# Patient Record
Sex: Female | Born: 1950 | Race: White | Hispanic: No | Marital: Married | Smoking: Never smoker
Health system: Southern US, Community
[De-identification: ages and names within clinical notes are randomized; demographics above are authoritative.]

## PROBLEM LIST (undated history)

## (undated) DIAGNOSIS — IMO0002 Reserved for concepts with insufficient information to code with codable children: Secondary | ICD-10-CM

## (undated) DIAGNOSIS — E0590 Thyrotoxicosis, unspecified without thyrotoxic crisis or storm: Secondary | ICD-10-CM

## (undated) DIAGNOSIS — I4891 Unspecified atrial fibrillation: Secondary | ICD-10-CM

## (undated) DIAGNOSIS — M545 Low back pain, unspecified: Secondary | ICD-10-CM

## (undated) DIAGNOSIS — R209 Unspecified disturbances of skin sensation: Secondary | ICD-10-CM

## (undated) DIAGNOSIS — Z973 Presence of spectacles and contact lenses: Secondary | ICD-10-CM

## (undated) DIAGNOSIS — M810 Age-related osteoporosis without current pathological fracture: Secondary | ICD-10-CM

## (undated) DIAGNOSIS — C50912 Malignant neoplasm of unspecified site of left female breast: Secondary | ICD-10-CM

## (undated) DIAGNOSIS — G709 Myoneural disorder, unspecified: Secondary | ICD-10-CM

## (undated) DIAGNOSIS — C50919 Malignant neoplasm of unspecified site of unspecified female breast: Secondary | ICD-10-CM

## (undated) DIAGNOSIS — B977 Papillomavirus as the cause of diseases classified elsewhere: Secondary | ICD-10-CM

## (undated) DIAGNOSIS — Z9889 Other specified postprocedural states: Secondary | ICD-10-CM

## (undated) DIAGNOSIS — D249 Benign neoplasm of unspecified breast: Secondary | ICD-10-CM

## (undated) DIAGNOSIS — IMO0001 Reserved for inherently not codable concepts without codable children: Secondary | ICD-10-CM

## (undated) DIAGNOSIS — R112 Nausea with vomiting, unspecified: Secondary | ICD-10-CM

## (undated) HISTORY — DX: Myoneural disorder, unspecified: G70.9

## (undated) HISTORY — PX: BREAST SURGERY: SHX581

## (undated) HISTORY — DX: Reserved for inherently not codable concepts without codable children: IMO0001

## (undated) HISTORY — DX: Low back pain, unspecified: M54.50

## (undated) HISTORY — DX: Reserved for concepts with insufficient information to code with codable children: IMO0002

## (undated) HISTORY — PX: OTHER SURGICAL HISTORY: SHX169

## (undated) HISTORY — DX: Benign neoplasm of unspecified breast: D24.9

## (undated) HISTORY — DX: Age-related osteoporosis without current pathological fracture: M81.0

## (undated) HISTORY — DX: Papillomavirus as the cause of diseases classified elsewhere: B97.7

## (undated) HISTORY — DX: Unspecified disturbances of skin sensation: R20.9

## (undated) HISTORY — DX: Low back pain: M54.5

## (undated) HISTORY — PX: DILATION AND CURETTAGE OF UTERUS: SHX78

## (undated) HISTORY — DX: Thyrotoxicosis, unspecified without thyrotoxic crisis or storm: E05.90

## (undated) HISTORY — DX: Unspecified atrial fibrillation: I48.91

## (undated) HISTORY — DX: Malignant neoplasm of unspecified site of unspecified female breast: C50.919

---

## 1998-06-30 HISTORY — PX: OTHER SURGICAL HISTORY: SHX169

## 1998-09-26 ENCOUNTER — Other Ambulatory Visit: Admission: RE | Admit: 1998-09-26 | Discharge: 1998-09-26 | Payer: Self-pay | Admitting: Radiology

## 1998-10-17 ENCOUNTER — Ambulatory Visit (HOSPITAL_BASED_OUTPATIENT_CLINIC_OR_DEPARTMENT_OTHER): Admission: RE | Admit: 1998-10-17 | Discharge: 1998-10-17 | Payer: Self-pay | Admitting: General Surgery

## 1998-11-29 HISTORY — PX: OTHER SURGICAL HISTORY: SHX169

## 1998-12-18 ENCOUNTER — Encounter (INDEPENDENT_AMBULATORY_CARE_PROVIDER_SITE_OTHER): Payer: Self-pay | Admitting: Specialist

## 1998-12-18 ENCOUNTER — Ambulatory Visit (HOSPITAL_COMMUNITY): Admission: RE | Admit: 1998-12-18 | Discharge: 1998-12-18 | Payer: Self-pay | Admitting: *Deleted

## 1999-04-15 ENCOUNTER — Encounter (INDEPENDENT_AMBULATORY_CARE_PROVIDER_SITE_OTHER): Payer: Self-pay

## 1999-04-15 ENCOUNTER — Other Ambulatory Visit: Admission: RE | Admit: 1999-04-15 | Discharge: 1999-04-15 | Payer: Self-pay | Admitting: *Deleted

## 2001-10-20 ENCOUNTER — Encounter: Payer: Self-pay | Admitting: Internal Medicine

## 2001-10-20 ENCOUNTER — Encounter: Admission: RE | Admit: 2001-10-20 | Discharge: 2001-10-20 | Payer: Self-pay | Admitting: Internal Medicine

## 2002-01-03 ENCOUNTER — Ambulatory Visit (HOSPITAL_COMMUNITY): Admission: RE | Admit: 2002-01-03 | Discharge: 2002-01-03 | Payer: Self-pay | Admitting: *Deleted

## 2002-03-01 ENCOUNTER — Other Ambulatory Visit: Admission: RE | Admit: 2002-03-01 | Discharge: 2002-03-01 | Payer: Self-pay | Admitting: Obstetrics & Gynecology

## 2003-03-20 ENCOUNTER — Other Ambulatory Visit: Admission: RE | Admit: 2003-03-20 | Discharge: 2003-03-20 | Payer: Self-pay | Admitting: Obstetrics & Gynecology

## 2004-06-30 DIAGNOSIS — D249 Benign neoplasm of unspecified breast: Secondary | ICD-10-CM

## 2004-06-30 HISTORY — DX: Benign neoplasm of unspecified breast: D24.9

## 2005-07-04 ENCOUNTER — Encounter: Admission: RE | Admit: 2005-07-04 | Discharge: 2005-07-04 | Payer: Self-pay | Admitting: Internal Medicine

## 2006-06-12 ENCOUNTER — Ambulatory Visit: Payer: Self-pay | Admitting: Internal Medicine

## 2007-11-10 ENCOUNTER — Encounter: Payer: Self-pay | Admitting: Internal Medicine

## 2007-11-10 DIAGNOSIS — D249 Benign neoplasm of unspecified breast: Secondary | ICD-10-CM | POA: Insufficient documentation

## 2009-04-24 ENCOUNTER — Ambulatory Visit: Payer: Self-pay | Admitting: Endocrinology

## 2009-07-11 LAB — CONVERTED CEMR LAB: Pap Smear: NORMAL

## 2009-07-17 ENCOUNTER — Encounter: Payer: Self-pay | Admitting: Endocrinology

## 2010-06-17 LAB — CONVERTED CEMR LAB
ALT: 28 units/L (ref 0–35)
AST: 35 units/L (ref 0–37)
Alkaline Phosphatase: 53 units/L (ref 39–117)
Basophils Relative: 0.6 % (ref 0.0–3.0)
Bilirubin, Direct: 0.1 mg/dL (ref 0.0–0.3)
Chloride: 103 meq/L (ref 96–112)
Cholesterol: 196 mg/dL (ref 0–200)
Creatinine, Ser: 0.8 mg/dL (ref 0.4–1.2)
Eosinophils Relative: 2.4 % (ref 0.0–5.0)
LDL Cholesterol: 106 mg/dL — ABNORMAL HIGH (ref 0–99)
Leukocytes, UA: NEGATIVE
MCV: 93.1 fL (ref 78.0–100.0)
Monocytes Absolute: 0.3 10*3/uL (ref 0.1–1.0)
Monocytes Relative: 6.4 % (ref 3.0–12.0)
Neutrophils Relative %: 67.3 % (ref 43.0–77.0)
Potassium: 5.1 meq/L (ref 3.5–5.1)
RBC: 3.87 M/uL (ref 3.87–5.11)
Specific Gravity, Urine: 1.01 (ref 1.000–1.030)
Total CHOL/HDL Ratio: 2
Total Protein: 6.7 g/dL (ref 6.0–8.3)
Urobilinogen, UA: 0.2 (ref 0.0–1.0)
WBC: 5 10*3/uL (ref 4.5–10.5)

## 2010-06-18 ENCOUNTER — Ambulatory Visit: Payer: Self-pay | Admitting: Internal Medicine

## 2010-06-25 ENCOUNTER — Ambulatory Visit
Admission: RE | Admit: 2010-06-25 | Discharge: 2010-06-25 | Payer: Self-pay | Source: Home / Self Care | Attending: Internal Medicine | Admitting: Internal Medicine

## 2010-06-25 ENCOUNTER — Encounter: Payer: Self-pay | Admitting: Internal Medicine

## 2010-06-25 ENCOUNTER — Ambulatory Visit: Payer: Self-pay | Admitting: Internal Medicine

## 2010-06-25 DIAGNOSIS — R209 Unspecified disturbances of skin sensation: Secondary | ICD-10-CM | POA: Insufficient documentation

## 2010-06-25 DIAGNOSIS — M545 Low back pain: Secondary | ICD-10-CM

## 2010-06-25 LAB — CONVERTED CEMR LAB
Folate: >20 ng/mL
Vitamin B-12: 510 pg/mL (ref 211–911)

## 2010-08-01 NOTE — Assessment & Plan Note (Signed)
Summary: cpx-lb   Vital Signs:  Patient profile:   60 year old female Height:      70 inches Weight:      154 pounds BMI:     22.18 O2 Sat:      98 % on Room air Temp:     97.9 degrees F oral Pulse rate:   74 / minute BP sitting:   128 / 80  (left arm) Cuff size:   regular  Vitals Entered By: Bill Salinas CMA (June 25, 2010 1:35 PM)  O2 Flow:  Room air CC: cpx/ ab  Vision Screening:      Vision Comments: Normal eye exam Aug 2011   Primary Care Provider:  Jacques Navy MD  CC:  cpx/ ab.  History of Present Illness: Patient presents for a routine medical visit. [facility]   she is c/o paresthesia at the plantar aspect of toes 3-5 left foot. Present for several years without progression. No history of injury.  She does have varicose veins  Small blue lesion subdermal on the 3rd digit left. Non tender, mobile. No signs of inflammation.  Lower back pain that is triggered with any jolt. Not painful at other times. No limitation in activities. Not positional. No radiation. May benefit from L-S spine x-rays.  Has drainage after eating in the back of the throat followed by cough. Always associated with eating. Concerned for allergy issues vs GERD.   Preventive Screening-Counseling & Management  Alcohol-Tobacco     Alcohol drinks/day: <1     Alcohol type: beer     Alcohol Counseling: not indicated; use of alcohol is not excessive or problematic     Smoking Status: never  Caffeine-Diet-Exercise     Caffeine use/day: 4 cups in AM, 1 soft drink     Diet Comments: healthy diet     Diet Counseling: not indicated; diet is assessed to be healthy     Does Patient Exercise: no     Exercise Counseling: to improve exercise regimen  Hep-HIV-STD-Contraception     Hepatitis Risk: no risk noted     HIV Risk: no risk noted     STD Risk: no risk noted     Dental Visit-last 6 months yes     SBE monthly: no     SBE  Education/Counseling: not indicated; SBE done regularly  Safety-Violence-Falls     Seat Belt Use: yes     Helmet Use: n/a     Firearms in the Home: no firearms in the home     Smoke Detectors: yes     Violence in the Home: no risk noted     Sexual Abuse: no     Fall Risk: none      Sexual History:  currently monogamous.        Drug Use:  never.        Blood Transfusions:  no.    Current Medications (verified): 1)  Multivitamins   Tabs (Multiple Vitamin) .... Take 1 By Mouth Qd 2)  Calcium 600/vitamin D 600-400 Mg-Unit  Tabs (Calcium Carbonate-Vitamin D) .... Take 2 By Mouth Qd 3)  Vagifem .... Twice A Week 4)  Vitamin D3 1000 Unit Tabs (Cholecalciferol) .Marland Kitchen.. 1 Tablet Once A Day 5)  Actonel 35 Mg Tabs (Risedronate Sodium) .Marland Kitchen.. 1 Tablet Once A Week  Allergies (verified): No Known Drug Allergies  Past History:  Past Medical History: Sterotactix needle biopsy, breast calcifaction in 2000  D & C and hysteroscopy for uterine poylp in June 2000 atrophic vaginitis   Physician roster          gyn - Dr. Dortha Schwalbe          opthal - Dr. Emily Filbert  Past Surgical History: breast biopsy 2000 Hysteroscopy for uterine polyp  G2P2 - NSVD  Family History: Father - deceased @81 : SDH while on coumadin, had hyperlipidemia, had cornary artery disease, HTN, DM  Mother - 8: hypothyroid, incontinence, GERDm CHF, A. Fib, osteoporosis, corneal transplants, HTN grandfather CAD.  Neg - breast or colon cancer.  Social History[place] Married - '78 1 son - '81; dtr - '84; 1 gandchild on the way. work: Editor, commissioning and development Marriage in good healthCaffeine use/day:  4 cups in AM, 1 soft drink Does Patient Exercise:  no Hepatitis Risk:  no risk noted HIV Risk:  no risk noted STD Risk:  no risk noted Dental Care w/in 6 mos.:  yes Seat Belt Use:  yes Fall Risk:  none Sexual History:  currently monogamous Drug Use:  never Blood Transfusions:  no  Review of  Systems  The patient denies anorexia, fever, weight loss, weight gain, decreased hearing, hoarseness, chest pain, syncope, dyspnea on exertion, prolonged cough, headaches, hemoptysis, abdominal pain, hematochezia, severe indigestion/heartburn, incontinence, suspicious skin lesions, transient blindness, difficulty walking, unusual weight change, abnormal bleeding, angioedema, and breast masses.    Physical Exam  General:  Well-developed,well-nourished,in no acute distress; alert,appropriate and cooperative throughout examination Head:  Normocephalic and atraumatic without obvious abnormalities. No apparent alopecia or balding. Eyes:  vision grossly intact, pupils equal, pupils round, and corneas and lenses clear.  Further exam deferred to Dr. Dione Booze Ears:  External ear exam shows no significant lesions or deformities.  Otoscopic examination reveals clear canals, tympanic membranes are intact bilaterally without bulging, retraction, inflammation or discharge. Hearing is grossly normal bilaterally. Nose:  no external deformity, no external erythema, and no nasal discharge.   Mouth:  Oral mucosa and oropharynx without lesions or exudates.  Teeth in good repair. Neck:  supple, full ROM, no masses, no thyromegaly, and no carotid bruits.   Chest Wall:  No deformities, masses, or tenderness noted. Breasts:  deferred to gyn Lungs:  Normal respiratory effort, chest expands symmetrically. Lungs are clear to auscultation, no crackles or wheezes. Heart:  Normal rate and regular rhythm. S1 and S2 normal without gallop, murmur, click, rub or other extra sounds. Abdomen:  soft, non-tender, normal bowel sounds, no masses, no guarding, and no hepatomegaly.   Genitalia:  deferred to gyn Msk:  no joint tenderness, no joint swelling, no joint warmth, no redness over joints, and no joint deformities.    Back: nl stand, able to get up to exam without assit, negative SLR sitting, nl DTRs patellar tendon Pulses:  2+  radial and DP pulses; distal foot cooler, slow capillary refill. Extremities:  No clubbing, cyanosis, edema, or deformity noted with normal full range of motion of all joints.   Neurologic:  Decreased deep vibratory sensation both feet starting at the malleolous.alert & oriented X3, cranial nerves II-XII intact, strength normal in all extremities, sensation intact to light touch, sensation intact to pinprick, gait normal, and DTRs symmetrical and normal.   Skin:  turgor normal, color normal, no suspicious lesions, no ulcerations, and no edema.   Cervical Nodes:  no anterior cervical adenopathy.   Psych:  Oriented X3, memory intact for recent and remote, normally interactive, good eye contact, and not  anxious appearing.     Impression & Recommendations:  Problem # 1:  BACK PAIN, LUMBAR (ICD-724.2) Normal exam and limited symptoms.  Plan - L-S spine films for a baseline           flex-stretch exercise will help preserve function.  AddendumRomona Curls SPINE COMPLETE W/BEND - [id number]   Clinical Data: Chronic low back pain.  Paresthesia   LUMBAR SPINE - COMPLETE WITH BENDING VIEWS   Comparison: None   Findings: Five lumbar vertebra are present.  Negative for fracture. Normal alignment.  Moderate to advanced disc degeneration at L5-S1 with  disc space narrowing and minimal spurring.  The remaining disc spaces are normal.  Negative for pars defect.  No mass lesion is present.   Flexion and extension views reveal normal alignment.   IMPRESSION: Moderate to advanced disc degeneration L5-S1 without instability.   Read By:  Camelia Phenes,  M.D  Problem # 2:  PARESTHESIA (ICD-782.0) Normal exam except for small vessel insufficiency distal foot and decreased deep vibratory sensation. This may be an idiopathic neuropathy vs L5-S1 radicular finding vs B12 deficiency  Plan - L-S spine films           B12 level  Orders: TLB-B12 + Folate Pnl ([id number]-B12/FOL) T-Lumbar Spine Comp  w/Bend View [phone])  Addendum: B12 level is normal                    L-S spine with moderate L5-S1 disk disease which may be the cause of her paresthesia  No treatment or futher diagnostic testing indicated at this time.  Problem # 3:  Preventive Health Care (ICD-V70.0) Patient has had no significant illness or medical problems. Her exam is generally normal except for decreased sensation feet. She is current with gynecology and breast health exams. Her labs are normal with an excellent lipid profile: high HDL and LDL of 106 which below goal of 130 or less. She is current with colorectal cancer screening with last study Jan '03, due for follow-up study 2013. Immunizations: Tetnus Jan '04. She is current with bone health screening having had a DXA with findings of osteopenia/osteoporosis and in on actonel for increasing bone density. Recommend calcium 1200mg  daily (diet + supplement) and Vit D 800-1000mg  daily. 12 Lead EKG is normal with no signs of ischemia or injury.  She is counseled to develop a regular exercise program with 3 sessions a week of aerobic exercise - sustained target heart rate 180% of resting pulse and 2-3 sesssions a week of flex-stretch. These can be combined, i.e. pilates, yoga, water exercise.  In summary - a very nice woman who is generally in good health. She is advised to return for routine exam and lab in 2-3 years, sooner on an as needed basis.   Complete Medication List: 1)  Multivitamins Tabs (Multiple vitamin) .... Take 1 by mouth qd 2)  Calcium 600/vitamin D 600-400 Mg-unit Tabs (Calcium carbonate-vitamin d) .... Take 2 by mouth qd 3)  Vagifem  .... Twice a week 4)  Vitamin D3 1000 Unit Tabs (Cholecalciferol) .Marland Kitchen.. 1 tablet once a day 5)  Actonel 35 Mg Tabs (Risedronate sodium) .Marland Kitchen.. 1 tablet once a week  Patient: Crystal [name] Note: All result statuses are Final unless otherwise noted.  Tests: (1) BMP (METABOL)   Sodium                    140 mEq/L  135-145   Potassium                 5.1 mEq/L                   3.5-5.1   Chloride                  103 mEq/L                   96-112   Carbon Dioxide            31 mEq/L                    19-32   Glucose                   90 mg/dL                    04-54   BUN                       15 mg/dL                    0-98   Creatinine                0.8 mg/dL                   1.1-9.1   Calcium                   9.6 mg/dL                   4.7-82.9   GFR                       79.25 mL/min                >60.00  Tests: (2) CBC Platelet w/Diff (CBCD)   White Cell Count          5.0 K/uL                    4.5-10.5   Red Cell Count            3.87 Mil/uL                 3.87-5.11   Hemoglobin                12.4 g/dL                   56.2-13.0   Hematocrit                36.0 %                      36.0-46.0   MCV                       93.1 fl                     78.0-100.0   MCHC                      34.5 g/dL                   86.5-78.4   RDW  12.5 %                      11.5-14.6   Platelet Count            223.0 K/uL                  150.0-400.0   Neutrophil %              67.3 %                      43.0-77.0   Lymphocyte %              23.3 %                      12.0-46.0   Monocyte %                6.4 %                       3.0-12.0   Eosinophils%              2.4 %                       0.0-5.0   Basophils %               0.6 %                       0.0-3.0   Neutrophill Absolute      3.4 K/uL                    1.4-7.7   Lymphocyte Absolute       1.2 K/uL                    0.7-4.0   Monocyte Absolute         0.3 K/uL                    0.1-1.0  Eosinophils, Absolute                             0.1 K/uL                    0.0-0.7   Basophils Absolute        0.0 K/uL                    0.0-0.1  Tests: (3) Hepatic/Liver Function Panel (HEPATIC)   Total Bilirubin           0.7 mg/dL                   1.6-1.0   Direct Bilirubin          0.1 mg/dL                    9.6-0.4   Alkaline Phosphatase      53 U/L                      39-117   AST                       35 U/L  0-37   ALT                       28 U/L                      0-35   Total Protein             6.7 g/dL                    1.6-1.0   Albumin                   3.9 g/dL                    9.6-0.4  Tests: (4) TSH (TSH)   FastTSH                   1.82 uIU/mL                 0.35-5.50  Tests: (5) Lipid Panel (LIPID)   Cholesterol               196 mg/dL                   5-409     ATP III Classification            Desirable:  < 200 mg/dL                    Borderline High:  200 - 239 mg/dL               High:  > = 240 mg/dL   Triglycerides             53.0 mg/dL                  8.1-191.4     Normal:  <150 mg/dL     Borderline High:  782 - 199 mg/dL   HDL                       95.62 mg/dL                 >13.08   VLDL Cholesterol          10.6 mg/dL                  6.5-78.4   LDL Cholesterol      [H]  696 mg/dL                   2-95  CHO/HDL Ratio:  CHD Risk                             2                    Men          Women     1/2 Average Risk     3.4          3.3     Average Risk          5.0          4.4     2X Average Risk          9.6          7.1     3X Average Risk  15.0          11.0                           Tests: (6) UDip w/Micro (URINE)   Color                     LT. YELLOW       RANGE:  Yellow;Lt. Yellow   Clarity                   CLEAR                       Clear   Specific Gravity          1.010                       1.000 - 1.030   Urine Ph                  5.5                         5.0-8.0   Protein                   NEGATIVE                    Negative   Urine Glucose             NEGATIVE                    Negative   Ketones                   NEGATIVE                    Negative   Urine Bilirubin           NEGATIVE                    Negative   Blood                     MODERATE                    Negative    Urobilinogen              0.2                         0.0 - 1.0   Leukocyte Esterace        NEGATIVE                    Negative   Nitrite                   NEGATIVE                    Negative   Urine WBC                 0-2/hpf                     0-2/hpf   Urine RBC                 3-6/hpf  0-2/hpf   Urine Epith               Rare(0-4/hpf)               Rare(0-4/hpf)   Urine Bacteria            Rare(<10/hpf)               None  Tests: (1) B12 + Folate Panel (B12/FOL)   Vitamin B12               510 pg/mL                   211-911   Folate                    >20.0 ng/mL  Orders Added: 1)  Est. Patient 40-64 years [99396] 2)  Est. Patient Level II [16109] 3)  TLB-B12 + Folate Pnl [[id number]-B12/FOL] 4)  T-Lumbar Spine Comp w/Bend View [72114TC]      Preventive Care Screening  Bone Density:    Date:  07/18/2009    Results:  abnormal std dev  Mammogram:    Date:  07/18/2009    Results:  normal   Pap Smear:    Date:  07/11/2009    Results:  normal   Last Tetanus Booster:    Date:  07/13/2002    Results:  Td   Colonoscopy:    Date:  07/14/2001    Results:  normal

## 2010-11-15 NOTE — Procedures (Signed)
[facility]  Patient:    Crystal [name], Crystal [name] Visit Number: [id number] MRN: [id number]          Service Type: END Location: ENDO Attending Physician:  Sabino Gasser Dictated by:   Sabino Gasser, M.D. Proc. Date: 01/03/02 Admit Date:  01/03/2002                             Procedure Report  PROCEDURE:  Colonoscopy.  INDICATION FOR PROCEDURE:  Colon cancer screening.  ANESTHESIA:  Demerol 60, Versed 6 mg.  DESCRIPTION OF PROCEDURE:  With the patient mildly sedated in the left lateral decubitus position, the Olympus videoscopic pediatric colonoscope was inserted in the rectum and passed under direct vision to the cecum identified by the ileocecal valve and appendiceal orifice both of which were photographed. From this point, the colonoscope was slowly withdrawn taking circumferential views of the entire colonic mucosa, stopping only in the rectum which appeared normal in direct and retroflexed view. The endoscope was straightened and withdrawn. The patients vital signs and pulse oximeter remained stable. The patient tolerated the procedure well without apparent complications.  FINDINGS:  Unremarkable examination.  PLAN:  Repeat examination in five years. Dictated by:   Sabino Gasser, M.D. Attending Physician:  Sabino Gasser DD:  01/03/02 TD:  01/04/02 Job: [id number]

## 2010-11-15 NOTE — Assessment & Plan Note (Signed)
[facility]                           PRIMARY CARE OFFICE NOTE   Crystal, [name]                   MRN:          [id number]  DATE:06/12/06                              DOB:          1951/02/22    Crystal [name] presents to establish for ongoing care as a new patient.  I have met her previously when she presented with her mother and father,  Crystal [name] .  She has no active complaints or problems at today's visit,  but presents to establish for ongoing continuity care.   PAST MEDICAL HISTORY:  SURGICAL:  1. Fibroadenoma excised, left breast 1996.  2. Stereotactic needle biopsy, breast calcifications in 2000.  3. Fibroadenoma excised from Crystal right breast in 2000.  4. D&C and hysteroscopy for uterine polyp in June of 2000.  MEDICAL HISTORY:  Usual childhood diseases, and patient is fully  immunized.  Otherwise, her history is negative.   PHYSICIAN ROSTER:  1. Dr. Francina Ames, General Surgery.  2. Dr. Seymour Bars for Gynecology.   MEDICATIONS:  Patient takes multivitamins and calcium with D, no  prescription medications.   HABITS:  TOBACCO:  None.  ALCOHOL:  Averaging 3 ounces per week.   FAMILY HISTORY:  Positive for uterine cancer in her mother.  Father had  hyperlipidemia, had coronary artery disease as did a grandfather.  History of hypertension in both parents and grandparents.  History of  type 2 diabetes in her father.  Father also had cardiac arrhythmia and  was on full anticoagulation.  He unfortunately fell and succumbed to  massive subdural hematoma.   GYN HISTORY:  Significant for being G2, P2, with successful vaginal  deliveries.   SOCIAL HISTORY:  Patient is a Buyer, retail of [employer]  She  has been married for 29 years.  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  She has 2 children,  daughter age 15, a son age 87, and they are both out of Crystal home but  still on Crystal  payroll.  She reports her marriage is in good health.  She  is doing well with grief associated with Crystal unexpected loss of her  father.   REVIEW OF SYSTEMS:  Patient has had no fever, sweats, chills, or weight  changes.  She does have mild sleep latency insomnia with poor sleep  hygiene which we discussed.  She has had an eye exam in Crystal last 24  months.  No ENT, cardiovascular, respiratory, GI complaints.  Crystal  patient has occasional stress incontinence.  No musculoskeletal or  dermatologic problems.   EXAMINATION:  Temperature was 98.7, blood pressure 115/80, pulse 68,  weight 152, height is 5 foot 11 inches.  GENERAL APPEARANCE:  This is a well-nourished, well-developed woman  looking younger than her chronologic age in no acute distress.  HEENT:  Normocephalic/atraumatic, EACs and TMs were unremarkable.  Oropharynx with native dentition in good repair, no buccal or palatal  lesions were noted.  Posterior pharynx was clear.  Conjunctivae and  sclerae were clear.  PERRLA/EOMI.  Funduscopic exam was unremarkable.  NECK:  Supple without thyromegaly.  NODES:  No adenopathy was noted in Crystal cervical, supraclavicular  regions.  CHEST:  No costovertebral angle tenderness.  LUNGS:  Clear to auscultation and percussion.  CARDIOVASCULAR:  2+ radial pulse, no JVD or carotid bruits.  She had a  quiet precordium with regular rate and rhythm, without murmurs, rubs, or  gallops.  BREAST:  Deferred to Gynecology.  ABDOMEN:  Soft, no guarding or rebound.  No organosplenomegaly was  noted.  PELVIC AND RECTAL:  Referred to Gynecology.  EXTREMITIES:  Without clubbing, cyanosis, edema, or deformity.  NEUROLOGIC:  Nonfocal.  SKIN:  Clear.   DATABASE:  CBC revealed a hemoglobin of 12.8 grams, white count was  5,500 with normal differential, platelet count 250,000.  Chemistries  were normal with a blood sugar of 96, BUN of 13, creatinine of 0.9,  liver functions were normal.  Renal function was a  GFR of 84 ml per  minute.  Lipid panel with a cholesterol of 194, triglycerides 46, HDL  was extremely good at 95.6, LDL was 89.   A 12 lead echocardiogram revealed a normal sinus rhythm and a normal  EKG.  Chest x-ray was performed and it was normal, final report from  radiology pending.   ASSESSMENT/PLAN:  This is a delightful woman who seems to be very  healthy and medically stable at this time with no active medical  problems.  Her examination and laboratory are unrevealing.  She is  current and up to date with her gynecologist for GYN screening.  She is  current and up to date with colorectal cancer screening, with Crystal last  colonoscopy by her report in 2003, which was normal making her a  candidate for followup study in 2013.  She had a tetanus shot in 2006.  Last Pap smear was January 2007. 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   Patient is asked to return to see me on a p.r.n. basis and for routine  follow evaluation, examination, and general laboratory in 3 years.     Crystal Gess Norins, MD  Electronically Signed    MEN/MedQ  DD: 06/12/2006  DT: 06/13/2006  Job #: [id number]   cc:   Eudelia Bunch, M.D.

## 2011-05-06 ENCOUNTER — Encounter: Payer: Self-pay | Admitting: Internal Medicine

## 2011-05-07 ENCOUNTER — Encounter: Payer: Self-pay | Admitting: Internal Medicine

## 2011-05-07 ENCOUNTER — Ambulatory Visit (INDEPENDENT_AMBULATORY_CARE_PROVIDER_SITE_OTHER): Payer: 59 | Admitting: Internal Medicine

## 2011-05-07 VITALS — BP 110/82 | HR 94 | Temp 98.2°F

## 2011-05-07 DIAGNOSIS — S99929A Unspecified injury of unspecified foot, initial encounter: Secondary | ICD-10-CM

## 2011-05-07 DIAGNOSIS — S99921A Unspecified injury of right foot, initial encounter: Secondary | ICD-10-CM

## 2011-05-07 NOTE — Patient Instructions (Addendum)
Toe Fracture Your caregiver has diagnosed you as having a fractured toe. A toe fracture is a break in the bone of a toe. "Buddy taping" is a way of splinting your broken toe, by taping the broken toe to the toe next to it. This "buddy taping" will keep the injured toe from moving beyond normal range of motion. Buddy taping also helps the toe heal in a more normal alignment. It may take 6 to 8 weeks for the toe injury to heal. HOME CARE INSTRUCTIONS    Leave your toes taped together for as long as directed by your caregiver or until you see a doctor for a follow-up examination. You can change the tape after bathing. Always use a small piece of gauze or cotton between the toes when taping them together. This will help the skin stay dry and prevent infection.     Apply ice to the injury for 15 to 20 minutes each hour while awake for the first 2 days. Put the ice in a plastic bag and place a towel between the bag of ice and your skin.     After the first 2 days, apply heat to the injured area. Use heat for the next 2 to 3 days. Place a heating pad on the foot or soak the foot in warm water as directed by your caregiver.     Keep your foot elevated as much as possible to lessen swelling.     Wear sturdy, supportive shoes. The shoes should not pinch the toes or fit tightly against the toes.     Your caregiver may prescribe a rigid shoe if your foot is very swollen.     Your may be given crutches if the pain is too great and it hurts too much to walk.     Only take over-the-counter or prescription medicines for pain, discomfort, or fever as directed by your caregiver.     If your caregiver has given you a follow-up appointment, it is very important to keep that appointment. Not keeping the appointment could result in a chronic or permanent injury, pain, and disability. If there is any problem keeping the appointment, you must call back to this facility for assistance.  SEEK MEDICAL CARE IF:    You  have increased pain or swelling, not relieved with medications.     The pain does not get better after 1 week.     Your injured toe is cold when the others are warm.  SEEK IMMEDIATE MEDICAL CARE IF:    The toe becomes cold, numb, or white.     The toe becomes hot (inflamed) and red.  Document Released: 06/13/2000 Document Revised: 02/26/2011 Document Reviewed: 01/31/2008

## 2011-05-07 NOTE — Progress Notes (Signed)
  Subjective:    Patient ID: Crystal [name], female    DOB: 1951-01-21, 60 y.o.   MRN: [id number]  HPI Complains of injury to right little toe Onset a week and a half ago October 29 Precipitated by self-induced trauma when kicked her foot against edge of luggage Manifest as pain and swelling but no obvious dislocation or deformity No time in schedule permitting evaluation prior to today In the past 10 days since onset the swelling has improved and the pain is tolerable Ambulates with shoes on and no need for crutch assistance  Past Medical History  Diagnosis Date  . BACK PAIN, LUMBAR   . FIBROADENOMA, BREAST   . PARESTHESIA     Review of Systems  Musculoskeletal: Negative for myalgias and gait problem.  Neurological: Negative for syncope and numbness.       Objective:   Physical Exam BP 110/82  Pulse 94  Temp(Src) 98.2 F (36.8 C) (Oral)  SpO2 97% GEN: NAD MSkel: Generalized soft tissue swelling of her lateral right forefoot. Fifth toe with sausage swelling and erythema but no dislocation or bruising. No pain over palpation of metatarsals and passive range of motion of the little toe with minimal pain.     Assessment & Plan:  Right 5th toe injury - suspect fracture - pain controlled; ambulates without pain.  Discussed checking x-ray - but as findings unlikely to change management, we have opted against same. Discussed pros and cons of buddy taping little toe if needed for support or control of pain.  Reviewed natural course and history, possibility of increased arthritis in future.  Supportive care recommended, to call if worse

## 2011-07-28 ENCOUNTER — Telehealth: Payer: Self-pay

## 2011-07-28 NOTE — Telephone Encounter (Signed)
Pt was at work and states that she i feeling much better, no fever, SOB

## 2011-07-28 NOTE — Telephone Encounter (Signed)
[facility] Triage Call Report Triage Record Num: [id number] Operator: Jeraldine Loots Patient Name: Crystal [name] Call Date & Time: 07/26/2011 9:48:51AM Patient Phone: [phone] PCP: Illene Regulus Patient Gender: Female PCP Fax : [phone] Patient DOB: 04/11/1951 [facility] Name: [facility] Reason for Call: Caller: [name]/Patient; PCP: Illene Regulus; CB#: [phone]; Call Reason: Headache; Sx Onset: 07/24/2011; Sx Notes: Has had a h/a since Thursday, sore throat and just not feeling good. No body aches. No fever. ; Afebrile; Home treatment(s) tried: Ibuprofen,sudafed ; Did home treatment help?: Yes; Guideline Used: URI; Disp:home care; Appt Scheduled?: no Will continue with the Sudafed and Motrin; push liquids and use cool mist humidifier. Protocol(s) Used: Upper Respiratory Infection (URI) Recommended Outcome per Protocol: Provide Home/Self Care Reason for Outcome: New onset of two or more of the following symptoms: nasal congestion with runny nose; sneezing; itchy or mild sore throat; mild headache or body aches; mild fatigue; low grade fever up to 101.5 F (38.6C) usually lasting about a week Care Advice: ~ Use a cool mist humidifier to moisten air. Be sure to clean according to manufacturer's instructions. ~ Call provider if symptoms worsen or new symptoms develop. ~ Consider use of a saline nasal spray per package directions to help relieve nasal congestion. ~ SYMPTOM / CONDITION MANAGEMENT Most adults need to drink 6-10 eight-ounce glasses (1.2-2.0 liters) of fluids per day unless previously told to limit fluid intake for other medical reasons. Limit fluids that contain caffeine, sugar or alcohol. Urine will be a very light yellow color when you drink enough fluids. ~ 07/26/2011 9:55:46AM Page 1 of 1 [id number]

## 2011-07-28 NOTE — Telephone Encounter (Signed)
Please call the patient for follow-up. Are they still feeling ill? Did they seek care over the weekend? Do they need to be seen today? If so may add on

## 2011-07-31 ENCOUNTER — Ambulatory Visit (INDEPENDENT_AMBULATORY_CARE_PROVIDER_SITE_OTHER): Payer: 59 | Admitting: Internal Medicine

## 2011-07-31 ENCOUNTER — Encounter: Payer: Self-pay | Admitting: Internal Medicine

## 2011-07-31 VITALS — BP 110/62 | HR 88 | Temp 97.2°F

## 2011-07-31 DIAGNOSIS — J209 Acute bronchitis, unspecified: Secondary | ICD-10-CM

## 2011-07-31 MED ORDER — AMOXICILLIN-POT CLAVULANATE 875-125 MG PO TABS: 1.0000 | ORAL_TABLET | Freq: Two times a day (BID) | ORAL | Status: AC

## 2011-07-31 NOTE — Progress Notes (Signed)
  Subjective:    HPI  complains of cold symptoms  Onset >1 week ago, wax/wane symptoms  not associated with rhinorrhea, sneezing, associated with sore throat, mild headache and low grade fever; Also myalgias, sinus pressure and mild-mod chest congestion Minimal relief with OTC meds Precipitated by sick contacts  Past Medical History  Diagnosis Date  . BACK PAIN, LUMBAR   . FIBROADENOMA, BREAST   . PARESTHESIA     Review of Systems Constitutional: low grade fever and night sweats last PM; no unexpected weight change Pulmonary: No pleurisy or hemoptysis Cardiovascular: No chest pain or palpitations     Objective:   Physical Exam BP 110/62  Pulse 88  Temp(Src) 97.2 F (36.2 C) (Oral)  SpO2 98% GEN: mildly ill appearing and audible head/chest congestion HENT: NCAT, mild sinus tenderness bilaterally, nares without discharge, oropharynx mild erythema, no exudate Eyes: Vision grossly intact, no conjunctivitis Lungs: Few scattered rhonchi - no wheeze, no increased work of breathing Cardiovascular: Regular rate and rhythm, no bilateral edema      Assessment & Plan:  Viral URI >>acute bronchitis Cough, postnasal drip related to above    Empiric antibiotics prescribed due to symptom duration greater than 7 days recommended OTC cough suppression - Symptomatic care with Tylenol or Advil, hydration and rest -  salt gargle advised as needed

## 2011-07-31 NOTE — Patient Instructions (Signed)
It was good to see you today. Augmentin antibiotics - Your prescription(s) have been submitted to your pharmacy. Please take as directed and contact our office if you believe you are having problem(s) with the medication(s). Hydrated, Vit C and rest - Alternate between ibuprofen and tylenol for aches, pain and fever symptoms as discussed Acute Bronchitis You have acute bronchitis. This means you have a chest cold. The airways in your lungs are red and sore (inflamed). Acute means it is sudden onset.   CAUSES Bronchitis is most often caused by the same virus that causes a cold. SYMPTOMS    Body aches.     Chest congestion.     Chills.    Cough.    Fever.    Shortness of breath.     Sore throat.  TREATMENT   Acute bronchitis is usually treated with rest, fluids, and medicines for relief of fever or cough. Most symptoms should go away after a few days or a week. Increased fluids may help thin your secretions and will prevent dehydration. Your caregiver may give you an inhaler to improve your symptoms. The inhaler reduces shortness of breath and helps control cough. You can take over-the-counter pain relievers or cough medicine to decrease coughing, pain, or fever. A cool-air vaporizer may help thin bronchial secretions and make it easier to clear your chest. Antibiotics are usually not needed but can be prescribed if you smoke, are seriously ill, have chronic lung problems, are elderly, or you are at higher risk for developing complications. Allergies and asthma can make bronchitis worse. Repeated episodes of bronchitis may cause longstanding lung problems. Avoid smoking and secondhand smoke. Exposure to cigarette smoke or irritating chemicals will make bronchitis worse. If you are a cigarette smoker, consider using nicotine gum or skin patches to help control withdrawal symptoms. Quitting smoking will help your lungs heal faster. Recovery from bronchitis is often slow, but you should start  feeling better after 2 to 3 days. Cough from bronchitis frequently lasts for 3 to 4 weeks. To prevent another bout of acute bronchitis:  Quit smoking.     Wash your hands frequently to get rid of viruses or use a hand sanitizer.     Avoid other people with cold or virus symptoms.     Try not to touch your hands to your mouth, nose, or eyes.  SEEK IMMEDIATE MEDICAL CARE IF:  You develop increased fever, chills, or chest pain.     You have severe shortness of breath or bloody sputum.     You develop dehydration, fainting, repeated vomiting, or a severe headache.     You have no improvement after 1 week of treatment or you get worse.  MAKE SURE YOU:    Understand these instructions.     Will watch your condition.     Will get help right away if you are not doing well or get worse.  Document Released: 07/24/2004 Document Revised: 02/26/2011 Document Reviewed: 10/09/2010 [facility] Patient Information 2012 [place], [place].

## 2012-04-29 ENCOUNTER — Encounter: Payer: Self-pay | Admitting: Internal Medicine

## 2012-04-29 ENCOUNTER — Other Ambulatory Visit: Payer: Self-pay | Admitting: Internal Medicine

## 2012-04-29 ENCOUNTER — Ambulatory Visit (INDEPENDENT_AMBULATORY_CARE_PROVIDER_SITE_OTHER)
Admission: RE | Admit: 2012-04-29 | Discharge: 2012-04-29 | Disposition: A | Discharge status: Home / Self Care | Payer: BC Managed Care – PPO | Source: Ambulatory Visit | Attending: Internal Medicine | Admitting: Internal Medicine

## 2012-04-29 ENCOUNTER — Ambulatory Visit (INDEPENDENT_AMBULATORY_CARE_PROVIDER_SITE_OTHER): Payer: BC Managed Care – PPO | Admitting: Internal Medicine

## 2012-04-29 VITALS — BP 122/82 | HR 61 | Temp 97.8°F | Ht 70.0 in | Wt 154.5 lb

## 2012-04-29 DIAGNOSIS — S92502A Displaced unspecified fracture of left lesser toe(s), initial encounter for closed fracture: Secondary | ICD-10-CM

## 2012-04-29 DIAGNOSIS — M79674 Pain in right toe(s): Secondary | ICD-10-CM

## 2012-04-29 DIAGNOSIS — M79609 Pain in unspecified limb: Secondary | ICD-10-CM

## 2012-04-29 DIAGNOSIS — S92919A Unspecified fracture of unspecified toe(s), initial encounter for closed fracture: Secondary | ICD-10-CM

## 2012-04-29 DIAGNOSIS — M79675 Pain in left toe(s): Secondary | ICD-10-CM

## 2012-04-29 NOTE — Patient Instructions (Signed)
Toe Fracture   with Rehab A fracture is a break in the bone that can be either partial or complete. Fractures of the toe bones may or may not include the joints that separate the bones. SYMPTOMS    Severe pain over the fracture site at the time of injury that may persist for an extend period of time.   Pain, tenderness, inflammation, and/or bruising (contusion) over the fracture site.   Visible deformity, if the bone fragments are not properly aligned (displaced fracture).   Signs of vascular damage: numbness or coldness (uncommon).  CAUSES   Toe fractures occur when a force is placed on the bone that is greater than it can withstand.  Direct hit (trauma) to the toe.   Indirect trauma to the toe, such as forcefully pivoting on a planted foot.  RISK INCREASES WITH:  Performing activities barefoot (i.e. ballet, gymnastics).   Wearing shoes with little support or protection.   Sports with cleats (i.e. football, rugby, lacrosse, soccer).   Bone disease (i.e. osteoporosis, bone tumors).  PREVENTION    Wear properly fitted and protective shoes.   Protect previously injured toes with tape or padding.  PROGNOSIS   If treated properly, toe fractures usually heal within 4 to 6 weeks. RELATED COMPLICATIONS    Failure of the fracture to heal (nonunion).   Healing of the fracture in a poor position (malunion).   Recurring symptoms.   Recurring symptoms that result in a chronic problem.   Excessive bleeding, causing pressure on nerves and blood vessels (rare).   Arthritis of the affected joints.   Stopping of bone growth in children.   Infection in fractures where the skin is broken over the fracture (open fracture).   Shortening of injured bones.  TREATMENT   Treatment first involves the use of ice and medicine to reduce pain and inflammation. The toe should be restrained for a period of time to allow for healing, usually about 4 weeks. Your caregiver may advise wearing a  hard-soled shoe to minimize stress on the healing bone. Surgery is uncommon for this injury, but may be necessary if the fracture is severely displaced or if the bone pushes through the skin. Surgery typically involves the use of screws, pins, and/or plates to hold the fracture in place. After surgery, restraint of the foot is necessary. MEDICATION    If pain medicine is necessary, nonsteroidal anti-inflammatory medications (aspirin and ibuprofen), or other minor pain relievers (acetaminophen), are often recommended.   Do not take pain medicine for 7 days before surgery.   Prescription pain relievers may be given if your caregiver thinks they are needed. Use only as directed and only as much as you need.  COLD THERAPY   Cold treatment (icing) relieves pain and reduces inflammation. Cold treatment should be applied for 10 to 15 minutes every 2 to 3 hours, and immediately after activity that aggravates your symptoms. Use ice packs or an ice massage. SEEK MEDICAL CARE IF:    Treatment does not seem to help, or the condition gets worse.   Any medicines produce negative side effects.   Any complications from surgery occur:   Pain, numbness, or coldness in the affected foot.   Discoloration beneath the toenails (blue or gray) of the affected foot.   Signs of infection (fever, pain, inflammation, redness, or persistent bleeding).  EXERCISES RANGE OF MOTION (ROM) AND STRETCHING EXERCISES - Toe Fracture (Phalangeal) These exercises may help you when beginning to rehabilitate your injury. Your symptoms  may resolve with or without further involvement from your physician, physical therapist or athletic trainer. While completing these exercises, remember:    Restoring tissue flexibility helps normal motion to return to the joints. This allows healthier, less painful movement and activity.   An effective stretch should be held for at least 30 seconds.   A stretch should never be painful. You should  only feel a gentle lengthening or release in the stretched tissue.  RANGE OF MOTION - Dorsi/Plantar Flexion  While sitting with your right / left knee straight, draw the top of your foot upwards by flexing your ankle. Then reverse the motion, pointing your toes downward.   Hold each position for __________ seconds.   After completing your first set of exercises, repeat this exercise with your knee bent.  Repeat __________ times. Complete this exercise __________ times per day.   RANGE OF MOTION - Ankle Alphabet Imagine your right / left big toe is a pen. Keeping your hip and knee still, write out the entire alphabet with your "pen." Make the letters as large as you can without increasing any discomfort. Repeat __________ times. Complete this exercise __________ times per day.   RANGE OF MOTION - Toe Extension, Flexion  Sit with your right / left leg crossed over your opposite knee.   Grasp your toes and gently pull them back toward the top of your foot. You should feel a stretch on the bottom of your toes and foot.   Hold this stretch for __________ seconds.   Now, gently pull your toes toward the bottom of your foot. You should feel a stretch on the top of your toes and foot.   Hold this stretch for __________ seconds.  Repeat __________ times. Complete this stretch__________ times per day.   STRENGTHENING EXERCISES - Toe Fracture (Phalangeal) These exercises may help you when beginning to rehabilitate your injury. They may resolve your symptoms with or without further involvement from your physician, physical therapist or athletic trainer. While completing these exercises, remember:    Muscles can gain both the endurance and the strength needed for everyday activities through controlled exercises.   Complete these exercises as instructed by your physician, physical therapist or athletic trainer. Increase the resistance and repetitions only as guided.   You may experience muscle  soreness or fatigue, but the pain or discomfort you are trying to eliminate should never worsen during these exercises. If this pain does get worse, stop and make sure you are following the directions exactly. If the pain is still present after adjustments, discontinue the exercise until you can discuss the trouble with your clinician.  STRENGTH - Towel Curls  Sit in a chair, on a non-carpeted surface.   Place your foot on a towel, keeping your heel on the floor.   Pull the towel toward your heel only by curling your toes. Keep your heel on the floor.   If instructed by your physician, physical therapist or athletic trainer, add ____________________ at the end of the towel.  Repeat __________ times. Complete this exercise __________ times per day. Document Released: 06/16/2005 Document Revised: 09/08/2011 Document Reviewed: 09/28/2008 [facility] Patient Information 2013 [place], [place].   RICE: Routine Care for Injuries The routine care of many injuries includes Rest, Ice, Compression, and Elevation (RICE). HOME CARE INSTRUCTIONS  Rest is needed to allow your body to heal. Routine activities can usually be resumed when comfortable. Injured tendons and bones can take up to 6 weeks to heal. Tendons are the  cord-like structures that attach muscle to bone.   Ice following an injury helps keep the swelling down and reduces pain.   Put ice in a plastic bag.   Place a towel between your skin and the bag.   Leave the ice on for 15 to 20 minutes, 3 to 4 times a day. Do this while awake, for the first 24 to 48 hours. After that, continue as directed by your caregiver.   Compression helps keep swelling down. It also gives support and helps with discomfort. If an elastic bandage has been applied, it should be removed and reapplied every 3 to 4 hours. It should not be applied tightly, but firmly enough to keep swelling down. Watch fingers or toes for swelling, bluish discoloration, coldness, numbness, or  excessive pain. If any of these problems occur, remove the bandage and reapply loosely. Contact your caregiver if these problems continue.   Elevation helps reduce swelling and decreases pain. With extremities, such as the arms, hands, legs, and feet, the injured area should be placed near or above the level of the heart, if possible.  SEEK IMMEDIATE MEDICAL CARE IF:  You have persistent pain and swelling.   You develop redness, numbness, or unexpected weakness.   Your symptoms are getting worse rather than improving after several days.  These symptoms may indicate that further evaluation or further X-rays are needed. Sometimes, X-rays may not show a small broken bone (fracture) until 1 week or 10 days later. Make a follow-up appointment with your caregiver. Ask when your X-ray results will be ready. Make sure you get your X-ray results. Document Released: 09/28/2000 Document Revised: 09/08/2011 Document Reviewed: 11/15/2010 [facility] Patient Information 2013 [place], [place].

## 2012-04-29 NOTE — Progress Notes (Signed)
  Subjective:    Patient ID: Crystal [name], female    DOB: [birth year], 61 y.o.   MRN: [id number]  HPI  Pt presents to clinic today with left toe pain. Pt stubbed her toe on a dresser approximately one week ago. Since then has had pain, swelling, bruising, and difficulty walking. Pain is worse when pt has shoes on or is walking around. Pain is better when she takes Advil. Pt has had previous broken toes in the past. Pt did try to buddy tape her pinky toe on her left foot, but was in slightly more pain with it taped so she took the tape off.  Review of Systems      Past Medical History  Diagnosis Date  . BACK PAIN, LUMBAR   . FIBROADENOMA, BREAST   . PARESTHESIA     Current Outpatient Prescriptions  Medication Sig Dispense Refill  . Calcium Carbonate-Vitamin D (CALCIUM 600-D) 600-400 MG-UNIT per tablet Take 2 tablets by mouth daily.        . Cholecalciferol (VITAMIN D3) 1000 UNITS CAPS Take by mouth daily.        . Multiple Vitamin (MULTIVITAMIN) tablet Take 1 tablet by mouth daily.          No Known Allergies  Family History  Problem Relation Age of Onset  . Hypothyroidism Mother   . GER disease Mother   . Osteoporosis Mother   . Hypertension Mother   . Hyperlipidemia Father   . Hypertension Father   . Diabetes Father   . Coronary artery disease Father   . Coronary artery disease Maternal Grandfather     Constitutional: Denies fever, malaise, fatigue, headache or abrupt weight changes.  Musculoskeletal:  Pertinent positives noted in HPI.  Skin:  Pt reports bruising over the tops of her toes. Denies  rashes, lesions or ulcercations.    No other specific complaints in a complete review of systems (except as listed in HPI above).  Objective:   Physical Exam   BP 122/82  Pulse 61  Temp 97.8 F (36.6 C) (Oral)  Ht 5\' 10"  (1.778 m)  Wt 154 lb 8 oz (70.081 kg)  BMI 22.17 kg/m2  SpO2 96% Wt Readings from Last 3 Encounters:  04/29/12 154 lb 8 oz (70.081 kg)    06/25/10 154 lb (69.854 kg)  04/24/09 152 lb (68.947 kg)    General: Appears her stated age, well developed, well nourished in NAD.  Cardiovascular: Normal rate and rhythm. S1,S2 noted.  No murmur, rubs or gallops noted. No JVD or BLE edema. No carotid bruits noted. Pulmonary/Chest: Normal effort and positive vesicular breath sounds. No respiratory distress. No wheezes, rales or ronchi noted.  Musculoskeletal:  Swelling and pinpoint tenderness noted along the left fifth toe phalanx. Limp noted. Skin: Ecchymosis noted over the tops of the second, third and fourth toes of the left foot. Skin intact.     Assessment & Plan:   Left fifth toe pain and swelling  Will obtain Xray of left fifth toe to assess for fracture Educated pt on how to buddy tape toes and instructed to do this for up to four weeks Pt declined hard sole shoe. Instructed patient to elevate foot and apply ice for about 15 minutes to reduce pain and swelling. Take Advil or Tylenol every 4-6 hours as needed for pain management.

## 2012-04-30 NOTE — Addendum Note (Signed)
Addended by: Lorre Munroe on: 04/30/2012 07:48 AM   Modules accepted: Level of Service

## 2012-07-29 ENCOUNTER — Encounter: Payer: Self-pay | Admitting: Internal Medicine

## 2013-03-30 ENCOUNTER — Encounter: Payer: Self-pay | Admitting: Internal Medicine

## 2013-03-30 ENCOUNTER — Other Ambulatory Visit (INDEPENDENT_AMBULATORY_CARE_PROVIDER_SITE_OTHER): Payer: BC Managed Care – PPO

## 2013-03-30 ENCOUNTER — Ambulatory Visit (INDEPENDENT_AMBULATORY_CARE_PROVIDER_SITE_OTHER): Payer: BC Managed Care – PPO | Admitting: Internal Medicine

## 2013-03-30 VITALS — BP 126/80 | HR 69 | Temp 97.8°F | Wt 153.0 lb

## 2013-03-30 DIAGNOSIS — Z23 Encounter for immunization: Secondary | ICD-10-CM

## 2013-03-30 DIAGNOSIS — M545 Low back pain, unspecified: Secondary | ICD-10-CM

## 2013-03-30 DIAGNOSIS — D249 Benign neoplasm of unspecified breast: Secondary | ICD-10-CM

## 2013-03-30 DIAGNOSIS — Z0000 Encounter for general adult medical examination without abnormal findings: Secondary | ICD-10-CM

## 2013-03-30 DIAGNOSIS — Z1211 Encounter for screening for malignant neoplasm of colon: Secondary | ICD-10-CM

## 2013-03-30 DIAGNOSIS — D241 Benign neoplasm of right breast: Secondary | ICD-10-CM

## 2013-03-30 HISTORY — DX: Encounter for general adult medical examination without abnormal findings: Z00.00

## 2013-03-30 LAB — CBC
Hemoglobin: 12.5 g/dL (ref 12.0–15.0)
MCHC: 34.3 g/dL (ref 30.0–36.0)
MCV: 90.7 fl (ref 78.0–100.0)
Platelets: 240 10*3/uL (ref 150.0–400.0)
RDW: 12.5 % (ref 11.5–14.6)

## 2013-03-30 NOTE — Assessment & Plan Note (Signed)
No active complaints at this visit 

## 2013-03-30 NOTE — Progress Notes (Signed)
  Subjective:    Patient ID: Crystal [name], female    DOB: Apr 13, 1951, 62 y.o.   MRN: [id number]  HPI Crystal [name] presents for follow up after a 2 year hiatus. In the interval she had a broken toe but has otherwise been well. She is current with Gyn - Dr. Seymour Bars. She is current with mammography and is due for colonoscopy.  Past Medical History  Diagnosis Date  . BACK PAIN, LUMBAR   . FIBROADENOMA, BREAST   . PARESTHESIA     toes   Past Surgical History  Procedure Laterality Date  . Sterotactix needle biopsy  2000    breast calcification  . D & c and hysteroscopy  11/1998    for uterine polyp  . Atrophic vaginitis     Family History  Problem Relation Age of Onset  . Hypothyroidism Mother   . GER disease Mother   . Osteoporosis Mother   . Hypertension Mother   . Cancer Mother     hodgkins lympoma, uterine cancer  . Hyperlipidemia Father   . Hypertension Father   . Diabetes Father   . Coronary artery disease Father   . Coronary artery disease Maternal Grandfather   . COPD Neg Hx   . Asthma Neg Hx    History   Social History  . Marital Status: Married    Spouse Name: Crystal [name]    Number of Children: 2  . Years of Education: 16   Occupational History  . [employer]    Social History Main Topics  . Smoking status: Never Smoker   . Smokeless tobacco: Never Used  . Alcohol Use: 2.5 oz/week    5 drink(s) per week     Comment: beer  . Drug Use: No  . Sexual Activity: Yes    Partners: Male   Other Topics Concern  . Not on file   Social History Narrative   HS[employer], [place] - [place], Married '78,  1 son '81, 1 daughter '84, 1 grandson '12. Work - Geneticist, molecular.  Lives with husband. ACP - discussed with patient. Refer - [email or web address]    Current Outpatient Prescriptions on File Prior to Visit  Medication Sig Dispense Refill  . Calcium Carbonate-Vitamin D (CALCIUM 600-D) 600-400 MG-UNIT per tablet Take 2 tablets by mouth daily.         . Cholecalciferol (VITAMIN D3) 1000 UNITS CAPS Take by mouth daily.        . Multiple Vitamin (MULTIVITAMIN) tablet Take 1 tablet by mouth daily.         No current facility-administered medications on file prior to visit.      Review of Systems System review is negative for any constitutional, cardiac, pulmonary, GI or neuro symptoms or complaints other than as described in the HPI.     Objective:   Physical Exam Filed Vitals:   03/30/13 0935  BP: 126/80  Pulse: 69  Temp: 97.8 F (36.6 C)   Gen'l - WNWD white woman HEENT- cerumen impaction bilaterally Neck - supple, no thyromegaly  Cor- 2+ radial, RRR Pulm - normal respirations Abd - soft, BS +, no HSM Ext - no deformity Neuro - A&O x 3, CN II-XII normal, normal gait.         Assessment & Plan:

## 2013-03-30 NOTE — Assessment & Plan Note (Signed)
No recurrent problems: normal exam by gyn. Mammogram - current and negative.

## 2013-03-30 NOTE — Patient Instructions (Addendum)
Good to see you  Ears were full and now are clear  Immunizations: Tdap today, good for 109 years. Check insurance coverage for Zostavax -shingles vaccine  Will schedule colonoscopy with Dr. Christella Hartigan  Advanced Care Planning - see materials. Go to [email or web address]  Continue to take good care of yourself.

## 2013-03-30 NOTE — Assessment & Plan Note (Signed)
Interval history is unremarkable. Limited physical exam normal. She is current with gyn. She will be having colonoscopy. Immunization - Tdap today. Labs are ordered and pending.  In summary A very nice woman who appears to be medically stable. She will notified about lab by MyChart or letter.

## 2013-03-31 LAB — BASIC METABOLIC PANEL
BUN: 16 mg/dL (ref 6–23)
Creatinine, Ser: 0.9 mg/dL (ref 0.4–1.2)
GFR: 71.18 mL/min (ref >60.00)
Potassium: 4.8 mEq/L (ref 3.5–5.1)

## 2013-03-31 LAB — LIPID PANEL
Total CHOL/HDL Ratio: 2
Triglycerides: 70 mg/dL (ref 0.0–149.0)

## 2013-03-31 LAB — HEPATIC FUNCTION PANEL
AST: 28 U/L (ref 0–37)
Albumin: 4.3 g/dL (ref 3.5–5.2)
Alkaline Phosphatase: 57 U/L (ref 39–117)
Total Bilirubin: 0.6 mg/dL (ref 0.3–1.2)

## 2013-05-05 ENCOUNTER — Other Ambulatory Visit: Payer: Self-pay

## 2013-07-18 ENCOUNTER — Encounter: Payer: Self-pay | Admitting: Gastroenterology

## 2013-08-22 ENCOUNTER — Ambulatory Visit (AMBULATORY_SURGERY_CENTER): Payer: Self-pay | Admitting: *Deleted

## 2013-08-22 ENCOUNTER — Encounter: Payer: Self-pay | Admitting: Gastroenterology

## 2013-08-22 VITALS — Ht 70.0 in | Wt 157.6 lb

## 2013-08-22 DIAGNOSIS — Z1211 Encounter for screening for malignant neoplasm of colon: Secondary | ICD-10-CM

## 2013-08-22 MED ORDER — MOVIPREP 100 G PO SOLR: 1.0000 | Freq: Once | ORAL | Status: DC

## 2013-08-22 NOTE — Progress Notes (Signed)
No egg or soy allergy. ewm No CPAP or home 02 use. ewm Pt states she did have some post op nausea with one past sedation. ewm Pt declined emmi video. ewm

## 2013-08-30 ENCOUNTER — Encounter: Payer: Self-pay | Admitting: Internal Medicine

## 2013-09-05 ENCOUNTER — Ambulatory Visit (AMBULATORY_SURGERY_CENTER): Payer: BC Managed Care – PPO | Admitting: Gastroenterology

## 2013-09-05 ENCOUNTER — Encounter: Payer: Self-pay | Admitting: Gastroenterology

## 2013-09-05 VITALS — BP 112/78 | HR 65 | Temp 97.5°F | Resp 66 | Ht 70.0 in | Wt 157.0 lb

## 2013-09-05 DIAGNOSIS — Z1211 Encounter for screening for malignant neoplasm of colon: Secondary | ICD-10-CM

## 2013-09-05 DIAGNOSIS — K648 Other hemorrhoids: Secondary | ICD-10-CM

## 2013-09-05 HISTORY — PX: COLONOSCOPY: SHX174

## 2013-09-05 MED ORDER — SODIUM CHLORIDE 0.9 % IV SOLN: 500.0000 mL | INTRAVENOUS | Status: DC

## 2013-09-05 NOTE — Patient Instructions (Signed)
YOU HAD AN ENDOSCOPIC PROCEDURE TODAY AT THE [facility]: Refer to the procedure report that was given to you for any specific questions about what was found during the examination.  If the procedure report does not answer your questions, please call your gastroenterologist to clarify.  If you requested that your care partner not be given the details of your procedure findings, then the procedure report has been included in a sealed envelope for you to review at your convenience later.  YOU SHOULD EXPECT: Some feelings of bloating in the abdomen. Passage of more gas than usual.  Walking can help get rid of the air that was put into your GI tract during the procedure and reduce the bloating. If you had a lower endoscopy (such as a colonoscopy or flexible sigmoidoscopy) you may notice spotting of blood in your stool or on the toilet paper. If you underwent a bowel prep for your procedure, then you may not have a normal bowel movement for a few days.  DIET: Your first meal following the procedure should be a light meal and then it is ok to progress to your normal diet.  A half-sandwich or bowl of soup is an example of a good first meal.  Heavy or fried foods are harder to digest and may make you feel nauseous or bloated.  Likewise meals heavy in dairy and vegetables can cause extra gas to form and this can also increase the bloating.  Drink plenty of fluids but you should avoid alcoholic beverages for 24 hours.  ACTIVITY: Your care partner should take you home directly after the procedure.  You should plan to take it easy, moving slowly for the rest of the day.  You can resume normal activity the day after the procedure however you should NOT DRIVE or use heavy machinery for 24 hours (because of the sedation medicines used during the test).    SYMPTOMS TO REPORT IMMEDIATELY: A gastroenterologist can be reached at any hour.  During normal business hours, 8:30 AM to 5:00 PM Monday through Friday,  call [phone].  After hours and on weekends, please call the GI answering service at [phone] who will take a message and have the physician on call contact you.   Following lower endoscopy (colonoscopy or flexible sigmoidoscopy):  Excessive amounts of blood in the stool  Significant tenderness or worsening of abdominal pains  Swelling of the abdomen that is new, acute  Fever of 100F or higher    FOLLOW UP: If any biopsies were taken you will be contacted by phone or by letter within the next 1-3 weeks.  Call your gastroenterologist if you have not heard about the biopsies in 3 weeks.  Our staff will call the home number listed on your records the next business day following your procedure to check on you and address any questions or concerns that you may have at that time regarding the information given to you following your procedure. This is a courtesy call and so if there is no answer at the home number and we have not heard from you through the emergency physician on call, we will assume that you have returned to your regular daily activities without incident.  SIGNATURES/CONFIDENTIALITY: You and/or your care partner have signed paperwork which will be entered into your electronic medical record.  These signatures attest to the fact that that the information above on your After Visit Summary has been reviewed and is understood.  Full responsibility of the confidentiality   of this discharge information lies with you and/or your care-partner.   Handouts were given to your husband on hemorrhoid and a high fiber diet with liberal fluid intake. You may resume your current medications today. Please call if any questions or concerns.

## 2013-09-05 NOTE — Progress Notes (Signed)
Procedure ends, to recovery, report given and VSS. 

## 2013-09-05 NOTE — Progress Notes (Signed)
No complaints noted in the recovery room. Maw   

## 2013-09-05 NOTE — Op Note (Signed)
[place]  [address]. [place], [address]   COLONOSCOPY PROCEDURE REPORT  PATIENT: Crystal [name], Crystal [name]  MR#: [id number] BIRTHDATE: [birth year] , 62  yrs. old GENDER: Female ENDOSCOPIST: Inda Castle, MD REFERRED BY: PROCEDURE DATE:  09/05/2013 PROCEDURE:   Colonoscopy, diagnostic First Screening Colonoscopy - Avg.  risk and is 50 yrs.  old or older - No.  Prior Negative Screening - Now for repeat screening. 10 or more years since last screening  History of Adenoma - Now for follow-up colonoscopy & has been > or = to 3 yrs.  N/A  Polyps Removed Today? No.  Recommend repeat exam, <10 yrs? No. ASA CLASS:   Class I INDICATIONS:Average risk patient for colon cancer. MEDICATIONS: MAC sedation, administered by CRNA and Propofol (Diprivan) 310 mg IV  DESCRIPTION OF PROCEDURE:   After the risks benefits and alternatives of the procedure were thoroughly explained, informed consent was obtained.  A digital rectal exam revealed no abnormalities of the rectum.   The [id number]  endoscope was introduced through the anus and advanced to the cecum, which was identified by both the appendix and ileocecal valve. No adverse events experienced.   The quality of the prep was Suprep good  The instrument was then slowly withdrawn as the colon was fully examined.      COLON FINDINGS: Internal hemorrhoids were found.   A normal appearing cecum, ileocecal valve, and appendiceal orifice were identified.  The ascending, hepatic flexure, transverse, splenic flexure, descending, sigmoid colon and rectum appeared unremarkable.  No polyps or cancers were seen.  Retroflexed views revealed no abnormalities. The time to cecum=5 minutes 29 seconds. Withdrawal time=7 minutes 27 seconds.  The scope was withdrawn and the procedure completed. COMPLICATIONS: There were no complications.  ENDOSCOPIC IMPRESSION: 1.   Internal hemorrhoids 2.   Normal  colon  RECOMMENDATIONS: Continue current colorectal screening recommendations for "routine risk" patients with a repeat colonoscopy in 10 years.   eSigned:  Inda Castle, MD 09/05/2013 10:37 AM   cc: Neena Rhymes, MD and Rolan Lipa, MD   PATIENT NAME:  Crystal [name], Crystal [name] MR#: [id number]

## 2013-09-06 ENCOUNTER — Telehealth: Payer: Self-pay | Admitting: *Deleted

## 2013-09-06 NOTE — Telephone Encounter (Signed)
  Follow up Call-  Call back number 09/05/2013  Post procedure Call Back phone  # [phone]     Patient questions:  Do you have a fever, pain , or abdominal swelling? no Pain Score  0 *  Have you tolerated food without any problems? yes  Have you been able to return to your normal activities? yes  Do you have any questions about your discharge instructions: Diet   no Medications  no Follow up visit  no  Do you have questions or concerns about your Care? no  Actions: * If pain score is 4 or above: No action needed, pain <4.

## 2014-01-18 ENCOUNTER — Ambulatory Visit (INDEPENDENT_AMBULATORY_CARE_PROVIDER_SITE_OTHER): Payer: BC Managed Care – PPO | Admitting: Internal Medicine

## 2014-01-18 ENCOUNTER — Encounter: Payer: Self-pay | Admitting: Internal Medicine

## 2014-01-18 VITALS — BP 118/80 | HR 99 | Temp 99.1°F | Ht 70.0 in | Wt 155.1 lb

## 2014-01-18 DIAGNOSIS — J209 Acute bronchitis, unspecified: Secondary | ICD-10-CM | POA: Insufficient documentation

## 2014-01-18 MED ORDER — AZITHROMYCIN 250 MG PO TABS: ORAL_TABLET | ORAL | Status: DC

## 2014-01-18 MED ORDER — PROMETHAZINE-CODEINE 6.25-10 MG/5ML PO SYRP: 5.0000 mL | ORAL_SOLUTION | ORAL | Status: DC | PRN

## 2014-01-18 NOTE — Progress Notes (Signed)
   Subjective:    URI  This is a new problem. The current episode started in the past 7 days. The problem has been gradually worsening. The maximum temperature recorded prior to her arrival was 100 - 100.9 F. Associated symptoms include coughing, rhinorrhea and a sore throat. She has tried acetaminophen and decongestant for the symptoms. The treatment provided no relief.      Review of Systems  HENT: Positive for rhinorrhea and sore throat.   Respiratory: Positive for cough.        Objective:   Physical Exam  Constitutional: She appears well-developed. No distress.  HENT:  Head: Normocephalic.  Right Ear: External ear normal.  Left Ear: External ear normal.  Nose: Nose normal.  Mouth/Throat: Oropharynx is clear and moist. No oropharyngeal exudate.  eryth throat  Eyes: Conjunctivae are normal. Pupils are equal, round, and reactive to light. Right eye exhibits no discharge. Left eye exhibits no discharge.  Neck: Normal range of motion. Neck supple. No JVD present. No tracheal deviation present. No thyromegaly present.  Cardiovascular: Normal rate, regular rhythm and normal heart sounds.   Pulmonary/Chest: No stridor. No respiratory distress. She has no wheezes.  Abdominal: Soft. Bowel sounds are normal. She exhibits no distension and no mass. There is no tenderness. There is no rebound and no guarding.  Musculoskeletal: She exhibits no edema and no tenderness.  Lymphadenopathy:    She has no cervical adenopathy.  Neurological: She displays normal reflexes. No cranial nerve deficit. She exhibits normal muscle tone. Coordination normal.  Skin: No rash noted. No erythema.  Psychiatric: She has a normal mood and affect. Her behavior is normal. Judgment and thought content normal.          Assessment & Plan:

## 2014-01-18 NOTE — Assessment & Plan Note (Signed)
Z pac Prom-cod syr prn

## 2014-01-18 NOTE — Progress Notes (Signed)
Pre visit review using our clinic review tool, if applicable. No additional management support is needed unless otherwise documented below in the visit note. 

## 2014-06-28 ENCOUNTER — Ambulatory Visit (INDEPENDENT_AMBULATORY_CARE_PROVIDER_SITE_OTHER): Payer: BC Managed Care – PPO | Admitting: Family

## 2014-06-28 ENCOUNTER — Encounter: Payer: Self-pay | Admitting: Family

## 2014-06-28 VITALS — BP 132/80 | HR 63 | Temp 98.0°F | Resp 18 | Ht 70.0 in | Wt 153.8 lb

## 2014-06-28 DIAGNOSIS — R059 Cough, unspecified: Secondary | ICD-10-CM | POA: Insufficient documentation

## 2014-06-28 DIAGNOSIS — R05 Cough: Secondary | ICD-10-CM

## 2014-06-28 MED ORDER — AMOXICILLIN-POT CLAVULANATE 875-125 MG PO TABS
1.0000 | ORAL_TABLET | Freq: Two times a day (BID) | ORAL | Status: DC
Start: 2014-06-28 — End: 2014-07-20

## 2014-06-28 NOTE — Progress Notes (Signed)
Pre visit review using our clinic review tool, if applicable. No additional management support is needed unless otherwise documented below in the visit note. 

## 2014-06-28 NOTE — Patient Instructions (Addendum)
Thank you for choosing [facility].  Summary/Instructions:  Your prescription(s) have been submitted to your pharmacy. Please take as directed and contact our office if you believe you are having problem(s) with the medication(s).  If your symptoms worsen or fail to improve, please contact our office for further instruction, or in case of emergency go directly to the emergency room at the closest medical facility.    General Recommendations: Please drink plenty of fluids. Sleep in humidified air if possible. Use saline nasal sprays or the Netti pot as needed.   Medicaitons:  Flonase as needed to assist with congestion. You may use Mucinex to help break up congestion as you feel is needed. Tylenol as needed for fever. Sudafed for congestion. Delsym or Robutussin-DM for cough.   Sinusitis Sinusitis is redness, soreness, and inflammation of the paranasal sinuses. Paranasal sinuses are air pockets within the bones of your face (beneath the eyes, the middle of the forehead, or above the eyes). In healthy paranasal sinuses, mucus is able to drain out, and air is able to circulate through them by way of your nose. However, when your paranasal sinuses are inflamed, mucus and air can become trapped. This can allow bacteria and other germs to grow and cause infection. Sinusitis can develop quickly and last only a short time (acute) or continue over a long period (chronic). Sinusitis that lasts for more than 12 weeks is considered chronic.  CAUSES  Causes of sinusitis include:  Allergies.  Structural abnormalities, such as displacement of the cartilage that separates your nostrils (deviated septum), which can decrease the air flow through your nose and sinuses and affect sinus drainage.  Functional abnormalities, such as when the small hairs (cilia) that line your sinuses and help remove mucus do not work properly or are not present. SIGNS AND SYMPTOMS  Symptoms of acute and chronic sinusitis  are the same. The primary symptoms are pain and pressure around the affected sinuses. Other symptoms include:  Upper toothache.  Earache.  Headache.  Bad breath.  Decreased sense of smell and taste.  A cough, which worsens when you are lying flat.  Fatigue.  Fever.  Thick drainage from your nose, which often is green and may contain pus (purulent).  Swelling and warmth over the affected sinuses. DIAGNOSIS  Your health care provider will perform a physical exam. During the exam, your health care provider may:  Look in your nose for signs of abnormal growths in your nostrils (nasal polyps).  Tap over the affected sinus to check for signs of infection.  View the inside of your sinuses (endoscopy) using an imaging device that has a light attached (endoscope). If your health care provider suspects that you have chronic sinusitis, one or more of the following tests may be recommended:  Allergy tests.  Nasal culture. A sample of mucus is taken from your nose, sent to a lab, and screened for bacteria.  Nasal cytology. A sample of mucus is taken from your nose and examined by your health care provider to determine if your sinusitis is related to an allergy. TREATMENT  Most cases of acute sinusitis are related to a viral infection and will resolve on their own within 10 days. Sometimes medicines are prescribed to help relieve symptoms (pain medicine, decongestants, nasal steroid sprays, or saline sprays).  However, for sinusitis related to a bacterial infection, your health care provider will prescribe antibiotic medicines. These are medicines that will help kill the bacteria causing the infection.  Rarely, sinusitis is  caused by a fungal infection. In theses cases, your health care provider will prescribe antifungal medicine. For some cases of chronic sinusitis, surgery is needed. Generally, these are cases in which sinusitis recurs more than 3 times per year, despite other  treatments. HOME CARE INSTRUCTIONS   Drink plenty of water. Water helps thin the mucus so your sinuses can drain more easily.  Use a humidifier.  Inhale steam 3 to 4 times a day (for example, sit in the bathroom with the shower running).  Apply a warm, moist washcloth to your face 3 to 4 times a day, or as directed by your health care provider.  Use saline nasal sprays to help moisten and clean your sinuses.  Take medicines only as directed by your health care provider.  If you were prescribed either an antibiotic or antifungal medicine, finish it all even if you start to feel better. SEEK IMMEDIATE MEDICAL CARE IF:  You have increasing pain or severe headaches.  You have nausea, vomiting, or drowsiness.  You have swelling around your face.  You have vision problems.  You have a stiff neck.  You have difficulty breathing. MAKE SURE YOU:   Understand these instructions.  Will watch your condition.  Will get help right away if you are not doing well or get worse. Document Released: 06/16/2005 Document Revised: 10/31/2013 Document Reviewed: 07/01/2011 [facility] Patient Information 2015 [place], [place]. This information is not intended to replace advice given to you by your health care provider. Make sure you discuss any questions you have with your health care provider.

## 2014-06-28 NOTE — Assessment & Plan Note (Signed)
Symptoms and exam consistent with bacterial sinusitis. Start Augmentin 10 days. Continue over-the-counter medications as needed for symptom relief. Follow up if symptoms worsen or fail to improve.

## 2014-06-28 NOTE — Progress Notes (Signed)
   Subjective:    Patient ID: Crystal [name], female    DOB: 1950-09-14, 63 y.o.   MRN: [id number]  Chief Complaint  Patient presents with  . Cough    cough, sometimes productive, headache, sinus pressure, x2 to 3 weeks    HPI:  Crystal [name] is a 63 y.o. female who presents today for an acute visit.   Acute sinus symptoms of productive cough, headache, and maxillary sinus pressure have been going on for approximately 2-3 weeks now. Has tried cough syrups, sudafed, alka-seltzer, advil and has experience mild relief. Denies fevers or chills. Denies recent antibiotic use.  No Known Allergies   Current Outpatient Prescriptions on File Prior to Visit  Medication Sig Dispense Refill  . Calcium Carbonate-Vitamin D (CALCIUM 600-D) 600-400 MG-UNIT per tablet Take 2 tablets by mouth daily.      . Cholecalciferol (VITAMIN D3) 1000 UNITS CAPS Take by mouth daily.      . Multiple Vitamin (MULTIVITAMIN) tablet Take 1 tablet by mouth daily.       No current facility-administered medications on file prior to visit.      Review of Systems  Constitutional: Negative for fever and chills.  HENT: Positive for sinus pressure.   Respiratory: Positive for cough. Negative for shortness of breath.   Gastrointestinal: Negative for nausea and vomiting.  Neurological: Positive for headaches.      Objective:    BP 132/80 mmHg  Pulse 63  Temp(Src) 98 F (36.7 C) (Oral)  Resp 18  Ht 5\' 10"  (1.778 m)  Wt 153 lb 12.8 oz (69.763 kg)  BMI 22.07 kg/m2  SpO2 98% Nursing note and vital signs reviewed.  Physical Exam  Constitutional: She is oriented to person, place, and time. She appears well-developed and well-nourished. No distress.  HENT:  Right Ear: Hearing, tympanic membrane, external ear and ear canal normal.  Left Ear: Hearing, tympanic membrane, external ear and ear canal normal.  Nose: Right sinus exhibits maxillary sinus tenderness and frontal sinus tenderness. Left sinus exhibits  maxillary sinus tenderness and frontal sinus tenderness.  Mouth/Throat: Uvula is midline, oropharynx is clear and moist and mucous membranes are normal.  Cardiovascular: Normal rate, regular rhythm, normal heart sounds and intact distal pulses.   Pulmonary/Chest: Effort normal and breath sounds normal.  Neurological: She is alert and oriented to person, place, and time.  Skin: Skin is warm and dry.  Psychiatric: She has a normal mood and affect. Her behavior is normal. Judgment and thought content normal.       Assessment & Plan:

## 2014-07-03 ENCOUNTER — Ambulatory Visit (INDEPENDENT_AMBULATORY_CARE_PROVIDER_SITE_OTHER)
Admission: RE | Admit: 2014-07-03 | Discharge: 2014-07-03 | Disposition: A | Discharge status: Home / Self Care | Payer: BLUE CROSS/BLUE SHIELD | Source: Ambulatory Visit | Attending: Family | Admitting: Family

## 2014-07-03 ENCOUNTER — Encounter: Payer: Self-pay | Admitting: Family

## 2014-07-03 ENCOUNTER — Ambulatory Visit (INDEPENDENT_AMBULATORY_CARE_PROVIDER_SITE_OTHER): Payer: BLUE CROSS/BLUE SHIELD | Admitting: Family

## 2014-07-03 VITALS — BP 148/78 | HR 89 | Temp 98.9°F | Resp 18

## 2014-07-03 DIAGNOSIS — M79672 Pain in left foot: Secondary | ICD-10-CM

## 2014-07-03 MED ORDER — NAPROXEN 500 MG PO TABS: 500.0000 mg | ORAL_TABLET | Freq: Two times a day (BID) | ORAL | Status: DC

## 2014-07-03 NOTE — Addendum Note (Signed)
Addended by: Mauricio Po D on: 07/03/2014 10:34 PM   Modules accepted: Level of Service

## 2014-07-03 NOTE — Progress Notes (Addendum)
   Subjective:    Patient ID: Crystal [name], female    DOB: 04-21-1951, 64 y.o.   MRN: [id number]  Chief Complaint  Patient presents with  . Toe Pain    Left foot kicked a box 4th toe, red and swollen, hurts when pressure is put on it    HPI:  Crystal [name] is a 64 y.o. female who presents today for an acute visit.  Acute symptoms of left 4th toe pain with redness and swelling started about 3 days ago. Was walking on the way to bed when her foot struck a heavy box on the floors. Denies any sounds or sensations that were heard or felt. Has tried ice and elevation which helped some.   No Known Allergies   Current Outpatient Prescriptions on File Prior to Visit  Medication Sig Dispense Refill  . amoxicillin-clavulanate (AUGMENTIN) 875-125 MG per tablet Take 1 tablet by mouth 2 (two) times daily. 20 tablet 0  . Calcium Carbonate-Vitamin D (CALCIUM 600-D) 600-400 MG-UNIT per tablet Take 2 tablets by mouth daily.      . Cholecalciferol (VITAMIN D3) 1000 UNITS CAPS Take by mouth daily.      . Multiple Vitamin (MULTIVITAMIN) tablet Take 1 tablet by mouth daily.       No current facility-administered medications on file prior to visit.    Review of Systems  Musculoskeletal: Positive for joint swelling.       Objective:    BP 148/78 mmHg  Pulse 89  Temp(Src) 98.9 F (37.2 C) (Oral)  Resp 18  SpO2 98% Nursing note and vital signs reviewed.  Physical Exam  Constitutional: She is oriented to person, place, and time. She appears well-developed and well-nourished. No distress.  Cardiovascular: Normal rate, regular rhythm, normal heart sounds and intact distal pulses.   Pulmonary/Chest: Effort normal and breath sounds normal.  Musculoskeletal:  No obvious deformity or discoloration noted of left foot. Mild edema present for his metatarsal phalangeal joint. Range of motion is intact. Point tenderness along the metatarsophalangeal joint and the proximal phalange of the fourth  toe.  Neurological: She is alert and oriented to person, place, and time.  Skin: Skin is warm and dry.  Psychiatric: She has a normal mood and affect. Her behavior is normal. Judgment and thought content normal.       Assessment & Plan:   [phone]

## 2014-07-03 NOTE — Patient Instructions (Signed)
Thank you for choosing [facility].  Summary/Instructions:  Your prescription(s) have been submitted to your pharmacy or been printed and provided for you. Please take as directed and contact our office if you believe you are having problem(s) with the medication(s) or have any questions.  Please stop by radiology on the basement level of the building for your x-rays. Your results will be released to [facility] (or called to you) after review, usually within 72 hours after test completion. If any treatments or changes are necessary, you will be notified at that same time.  If your symptoms worsen or fail to improve, please contact our office for further instruction, or in case of emergency go directly to the emergency room at the closest medical facility.   Recommend a surgical support shoe.   Toe Fracture Your caregiver has diagnosed you as having a fractured toe. A toe fracture is a break in the bone of a toe. "Buddy taping" is a way of splinting your broken toe, by taping the broken toe to the toe next to it. This "buddy taping" will keep the injured toe from moving beyond normal range of motion. Buddy taping also helps the toe heal in a more normal alignment. It may take 6 to 8 weeks for the toe injury to heal. Odell your toes taped together for as long as directed by your caregiver or until you see a doctor for a follow-up examination. You can change the tape after bathing. Always use a small piece of gauze or cotton between the toes when taping them together. This will help the skin stay dry and prevent infection.  Apply ice to the injury for 15-20 minutes each hour while awake for the first 2 days. Put the ice in a plastic bag and place a towel between the bag of ice and your skin.  After the first 2 days, apply heat to the injured area. Use heat for the next 2 to 3 days. Place a heating pad on the foot or soak the foot in warm water as directed by your  caregiver.  Keep your foot elevated as much as possible to lessen swelling.  Wear sturdy, supportive shoes. The shoes should not pinch the toes or fit tightly against the toes.  Your caregiver may prescribe a rigid shoe if your foot is very swollen.  Your may be given crutches if the pain is too great and it hurts too much to walk.  Only take over-the-counter or prescription medicines for pain, discomfort, or fever as directed by your caregiver.  If your caregiver has given you a follow-up appointment, it is very important to keep that appointment. Not keeping the appointment could result in a chronic or permanent injury, pain, and disability. If there is any problem keeping the appointment, you must call back to this facility for assistance. SEEK MEDICAL CARE IF:   You have increased pain or swelling, not relieved with medications.  The pain does not get better after 1 week.  Your injured toe is cold when the others are warm. SEEK IMMEDIATE MEDICAL CARE IF:   The toe becomes cold, numb, or white.  The toe becomes hot (inflamed) and red. Document Released: 06/13/2000 Document Revised: 09/08/2011 Document Reviewed: 01/31/2008 [facility] Patient Information 2015 [place], [place]. This information is not intended to replace advice given to you by your health care provider. Make sure you discuss any questions you have with your health care provider.

## 2014-07-03 NOTE — Progress Notes (Signed)
Pre visit review using our clinic review tool, if applicable. No additional management support is needed unless otherwise documented below in the visit note. 

## 2014-07-03 NOTE — Assessment & Plan Note (Signed)
Symptoms and exam consistent with potential fracture of the fourth metatarsal or proximal fourth phalangeal. Obtain x-rays to rule out fracture. Start Naprosyn for pain. Recommend surgical shoe. Follow up if symptoms worsen or fail to improve.

## 2014-07-20 ENCOUNTER — Other Ambulatory Visit (INDEPENDENT_AMBULATORY_CARE_PROVIDER_SITE_OTHER): Payer: BLUE CROSS/BLUE SHIELD

## 2014-07-20 ENCOUNTER — Encounter: Payer: Self-pay | Admitting: Internal Medicine

## 2014-07-20 ENCOUNTER — Ambulatory Visit (INDEPENDENT_AMBULATORY_CARE_PROVIDER_SITE_OTHER): Payer: BLUE CROSS/BLUE SHIELD | Admitting: Internal Medicine

## 2014-07-20 VITALS — BP 118/80 | HR 70 | Temp 97.9°F | Resp 16 | Ht 70.0 in | Wt 156.8 lb

## 2014-07-20 DIAGNOSIS — Z418 Encounter for other procedures for purposes other than remedying health state: Secondary | ICD-10-CM

## 2014-07-20 DIAGNOSIS — Z299 Encounter for prophylactic measures, unspecified: Secondary | ICD-10-CM

## 2014-07-20 DIAGNOSIS — Z0000 Encounter for general adult medical examination without abnormal findings: Secondary | ICD-10-CM

## 2014-07-20 DIAGNOSIS — R209 Unspecified disturbances of skin sensation: Secondary | ICD-10-CM

## 2014-07-20 DIAGNOSIS — M545 Low back pain, unspecified: Secondary | ICD-10-CM

## 2014-07-20 DIAGNOSIS — M79672 Pain in left foot: Secondary | ICD-10-CM

## 2014-07-20 DIAGNOSIS — Z23 Encounter for immunization: Secondary | ICD-10-CM

## 2014-07-20 LAB — BASIC METABOLIC PANEL
BUN: 19 mg/dL (ref 6–23)
CHLORIDE: 103 meq/L (ref 96–112)
CO2: 30 meq/L (ref 19–32)
Calcium: 9.8 mg/dL (ref 8.4–10.5)
Creatinine, Ser: 0.87 mg/dL (ref 0.40–1.20)
GFR: 69.94 mL/min (ref >60.00)
Glucose, Bld: 94 mg/dL (ref 70–99)
Potassium: 4.3 mEq/L (ref 3.5–5.1)
SODIUM: 138 meq/L (ref 135–145)

## 2014-07-20 LAB — LIPID PANEL
Cholesterol: 184 mg/dL (ref 0–200)
HDL: 84.9 mg/dL (ref >39.00)
LDL Cholesterol: 76 mg/dL (ref 0–99)
NONHDL: 99.1
TRIGLYCERIDES: 116 mg/dL (ref 0.0–149.0)
Total CHOL/HDL Ratio: 2
VLDL: 23.2 mg/dL (ref 0.0–40.0)

## 2014-07-20 NOTE — Patient Instructions (Signed)
We have given you the shingles shot today. We will check your blood work and call you back with the results.  If you have any problems or questions please feel free to call the office.  If you are doing well we will see you back in 1 year.

## 2014-07-20 NOTE — Progress Notes (Signed)
Pre visit review using our clinic review tool, if applicable. No additional management support is needed unless otherwise documented below in the visit note. 

## 2014-07-23 NOTE — Progress Notes (Signed)
   Subjective:    Patient ID: Crystal [name], female    DOB: [birth year], 64 y.o.   MRN: [id number]  HPI The patient is a 64 YO female who is coming in to establish care. She has PMH of some mild constipation (treated with fiber otc), paresthesia (in her foot, not worked up at this time due to mild symptoms). She denies chest pains, SOB, abdominal pain. She is a non-smoker. She does not exercise much and knows that she should. She does not suffer from arthritis pains and has no problems with ADLs. She does have a recent fracture to the 4th toe on her left foot which is doing better.   Review of Systems  Constitutional: Negative for chills, activity change, appetite change, fatigue and unexpected weight change.  HENT: Negative.   Respiratory: Negative for cough, chest tightness, shortness of breath and wheezing.   Cardiovascular: Negative for chest pain, palpitations and leg swelling.  Gastrointestinal: Negative for abdominal pain, diarrhea, constipation and abdominal distention.  Musculoskeletal: Negative.   Skin: Negative.   Neurological: Negative.   Psychiatric/Behavioral: Negative.       Objective:   Physical Exam  Constitutional: She is oriented to person, place, and time. She appears well-developed and well-nourished.  HENT:  Head: Normocephalic and atraumatic.  Eyes: EOM are normal.  Neck: Normal range of motion.  Cardiovascular: Normal rate and regular rhythm.   Pulmonary/Chest: Effort normal and breath sounds normal. No respiratory distress. She has no wheezes. She has no rales.  Abdominal: Soft. Bowel sounds are normal. She exhibits no distension. There is no tenderness. There is no rebound.  Musculoskeletal: She exhibits no edema.  Boot on left foot  Neurological: She is alert and oriented to person, place, and time. Coordination normal.  Skin: Skin is warm and dry.   Filed Vitals:   07/20/14 1438  BP: 118/80  Pulse: 70  Temp: 97.9 F (36.6 C)  TempSrc: Oral  Resp:  16  Height: 5\' 10"  (1.778 m)  Weight: 156 lb 12.8 oz (71.124 kg)  SpO2: 99%      Assessment & Plan:

## 2014-07-23 NOTE — Assessment & Plan Note (Signed)
She does have problems off and on and uses OTC meds as needed.

## 2014-07-23 NOTE — Assessment & Plan Note (Signed)
Relatively infrequent symptoms and does not wish to pursue nerve conduction testing.

## 2014-07-23 NOTE — Assessment & Plan Note (Signed)
Still in boot and doing better, she will use the boot for another 2 weeks for protection of the foot.

## 2014-10-02 ENCOUNTER — Other Ambulatory Visit: Payer: Self-pay | Admitting: Surgery

## 2014-10-02 DIAGNOSIS — D0511 Intraductal carcinoma in situ of right breast: Secondary | ICD-10-CM

## 2014-10-03 ENCOUNTER — Encounter: Payer: Self-pay | Admitting: Internal Medicine

## 2014-10-03 ENCOUNTER — Telehealth: Payer: Self-pay | Admitting: *Deleted

## 2014-10-03 NOTE — Telephone Encounter (Signed)
Received referral from [facility].  Called and left a message for the pt to return my call so I can schedule a med onc appt.  

## 2014-10-06 ENCOUNTER — Telehealth: Payer: Self-pay | Admitting: *Deleted

## 2014-10-06 NOTE — Telephone Encounter (Signed)
Called pt and confirmed 10/16/14 appt w/ her.  Mailed before appt letter, calendar, welcoming packet & intake form to pt.  Emailed Engineer, civil (consulting) at [employer] to make her aware.  Placed a copy of [facility] notes in Dr. Ernestina Penna box and took a copy to HIM to scan.  [facility] pt and all reports are scanned in Media.

## 2014-10-09 NOTE — Progress Notes (Signed)
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 Lindi Adie on 10/25/14  Lymphedema issues, if any:  no  Pain issues, if any:  no    OB GYN history: age at menarche 59 years, age of menopause 50-55, gravida 2, para 2, maternal age 58-30, used bcp for 3 years, has not taken hormone replacement.  SAFETY ISSUES:  Prior radiation? no  Pacemaker/ICD? no  Possible current pregnancy?no  Is the patient on methotrexate? no  Current Complaints / other details:  Patient is here with her husband.  She has 2 children.

## 2014-10-12 ENCOUNTER — Telehealth: Payer: Self-pay | Admitting: *Deleted

## 2014-10-12 ENCOUNTER — Encounter: Payer: Self-pay | Admitting: Radiation Oncology

## 2014-10-12 ENCOUNTER — Ambulatory Visit
Admission: RE | Admit: 2014-10-12 | Discharge: 2014-10-12 | Disposition: A | Discharge status: Home / Self Care | Payer: BLUE CROSS/BLUE SHIELD | Source: Ambulatory Visit | Attending: Radiation Oncology | Admitting: Radiation Oncology

## 2014-10-12 VITALS — BP 107/67 | HR 87 | Temp 98.4°F | Resp 12 | Ht 70.0 in | Wt 157.7 lb

## 2014-10-12 DIAGNOSIS — C50511 Malignant neoplasm of lower-outer quadrant of right female breast: Secondary | ICD-10-CM | POA: Diagnosis present

## 2014-10-12 DIAGNOSIS — C50911 Malignant neoplasm of unspecified site of right female breast: Secondary | ICD-10-CM

## 2014-10-12 HISTORY — DX: Malignant neoplasm of unspecified site of left female breast: C50.912

## 2014-10-12 NOTE — Progress Notes (Signed)
Please see the Nurse Progress Note in the MD Initial Consult Encounter for this patient. 

## 2014-10-12 NOTE — Progress Notes (Signed)
Radiation Oncology         [phone] ________________________________  Initial outpatient Consultation  Name: Crystal [name] MRN: [id number]  Date: 10/12/2014  DOB: 08/07/1950  XU:XYBFXO, Real Cons, MD  [name], MD   REFERRING PHYSICIAN: [name], MD  DIAGNOSIS: Intraductal carcinoma of the right breast  HISTORY OF PRESENT ILLNESS::Crystal [name] is a 64 y.o. female who is seen out of the courtesy of Dr Coralie Keens for an opinion concerning radiation therapy as part of management of patient's recently diagnosed intraductal carcinoma presenting in the lower outer quadrant of the right breast. In March of this year the patient underwent routine screening mammography which revealed a new cluster of microcalcifications extending over approximately 1 cm in the 7:00 position of the right breast. Was recommended additional imaging be performed. The patient did have ultrasound of the left breast to evaluate  cysts. Patient underwent aspiration of this issue which was located in the inner aspect of the left breast. Patient proceeded to undergo stereotactic biopsy of the calcifications within the right breast which revealed intraductal carcinoma. The tumor was estrogen receptor positive at 97% and progesterone receptor positive at 67%. With this information the patient is now seen in radiation oncology for evaluation.Marland Kitchen  PREVIOUS RADIATION THERAPY: No  PAST MEDICAL HISTORY:  has a past medical history of BACK PAIN, LUMBAR; FIBROADENOMA, BREAST (2006); PARESTHESIA; and Breast cancer, right breast.    PAST SURGICAL HISTORY: Past Surgical History  Procedure Laterality Date  . Sterotactix needle biopsy  2000    breast calcification  . D & c and hysteroscopy  11/1998    for uterine polyp  . Atrophic vaginitis    . Colonoscopy  09/05/2013    FAMILY HISTORY: family history includes Cancer in her mother; Coronary artery disease in her father and maternal grandfather;  Diabetes in her father; GER disease in her mother; Hyperlipidemia in her father; Hypertension in her father and mother; Hypothyroidism in her mother; Osteoporosis in her mother. There is no history of COPD, Asthma, Colon cancer, Esophageal cancer, Rectal cancer, or Stomach cancer.  SOCIAL HISTORY:  reports that she has never smoked. She has never used smokeless tobacco. She reports that she drinks about 2.5 oz of alcohol per week. She reports that she does not use illicit drugs.  ALLERGIES: Review of patient's allergies indicates no known allergies.  MEDICATIONS:  Current Outpatient Prescriptions  Medication Sig Dispense Refill  . Calcium Carbonate-Vitamin D (CALCIUM 600-D) 600-400 MG-UNIT per tablet Take 2 tablets by mouth daily.      . Cholecalciferol (VITAMIN D3) 1000 UNITS CAPS Take by mouth daily.      . fluticasone (FLONASE) 50 MCG/ACT nasal spray Place into both nostrils daily.    . Inulin (FIBER CHOICE FRUITY BITES PO) Take by mouth.    . Multiple Vitamin (MULTIVITAMIN) tablet Take 1 tablet by mouth daily.       No current facility-administered medications for this encounter.    REVIEW OF SYSTEMS:  A 15 point review of systems is documented in the electronic medical record. This was obtained by the nursing staff. However, I reviewed this with the patient to discuss relevant findings and make appropriate changes.  Prior to biopsy the patient denied any pain within either breast nipple discharge or bleeding. She denies any new bony pain headaches dizziness or blurred vision. Her appetite is good and energy level is excellent.   PHYSICAL EXAM:  height is 5\' 10"  (1.778 m) and weight is  157 lb 11.2 oz (71.532 kg). Her oral temperature is 98.4 F (36.9 C). Her blood pressure is 107/67 and her pulse is 87. Her respiration is 12.   BP 107/67 mmHg  Pulse 87  Temp(Src) 98.4 F (36.9 C) (Oral)  Resp 12  Ht 5\' 10"  (1.778 m)  Wt 157 lb 11.2 oz (71.532 kg)  BMI 22.63 kg/m2  General  Appearance:    Alert, cooperative, no distress, appears stated age, accompanied by husband on evaluation today   Head:    Normocephalic, without obvious abnormality, atraumatic  Eyes:    PERRL, conjunctiva/corneas clear, EOM's intact,      Nose:   Nares normal, septum midline, mucosa normal, no drainage    or sinus tenderness  Throat:   Lips, mucosa, and tongue normal; teeth and gums normal  Neck:   Supple, symmetrical, trachea midline, no adenopathy;    thyroid:  no enlargement/tenderness/nodules; no carotid   bruit or JVD  Back:     Symmetric, no curvature, ROM normal, no CVA tenderness  Lungs:     Clear to auscultation bilaterally, respirations unlabored  Chest Wall:    No tenderness or deformity   Heart:    Regular rate and rhythm, S1 and S2 normal, no murmur, rub   or gallop  Breast Exam:    No tenderness, masses, or nipple abnormality of  either breast. The patient has faint scars present in both breast from prior benign fibroma removal   Abdomen:     Soft, non-tender, bowel sounds active all four quadrants,    no masses, no organomegaly        Extremities:   Extremities normal, atraumatic, no cyanosis or edema  Pulses:   2+ and symmetric all extremities  Skin:   Skin color, texture, turgor normal, no rashes or lesions  Lymph nodes:   Cervical, supraclavicular, and axillary nodes normal  Neurologic:   normal strength, sensation and reflexes    throughout     ECOG = 0  0 - Asymptomatic (Fully active, able to carry on all predisease activities without restriction)   LABORATORY DATA:  Lab Results  Component Value Date   WBC 6.3 03/30/2013   HGB 12.5 03/30/2013   HCT 36.5 03/30/2013   MCV 90.7 03/30/2013   PLT 240.0 03/30/2013   NEUTROABS 3.4 06/17/2010   Lab Results  Component Value Date   NA 138 07/20/2014   K 4.3 07/20/2014   CL 103 07/20/2014   CO2 30 07/20/2014   GLUCOSE 94 07/20/2014   CREATININE 0.87 07/20/2014   CALCIUM 9.8 07/20/2014      RADIOGRAPHY:  No results found.    IMPRESSION: Intraductal carcinoma of the right breast. The patient would appear to be an excellent candidate for breast conservation with partial mastectomy followed by radiation therapy. Today I discussed this treatment approach as well as mastectomy. The patient is interested in breast conservation therapy and would like to  proceed with lumpectomy under the direction of Dr. Ninfa Linden in the near future.  PLAN: Patient will be seen in the postoperative setting for further evaluation. She will also meet with medical oncology at that time.    ------------------------------------------------  Blair Promise, PhD, MD

## 2014-10-12 NOTE — Telephone Encounter (Signed)
Pt called stating that she is not able to come on 4/18 due to [facility] calling her scheduling a seed implant.  They requested for her to call me to reschedule.  Went and made Dr. Burr Medico aware and she wanted pt to be seen before surgery, but does not have any availability.  Cancelled Dr. Ernestina Penna appt and confirmed pt to see Dr. Lindi Adie on 10/25/14.  Mailed before appt letter, calendar, welcoming packet & intake form to pt.  Emailed Engineer, civil (consulting) at [employer] w/ appt change.  Emailed Lenise to make her aware that the pt will be seen at 7:30.  Notified Dawn Tressie Ellis of all changes and requesting her to have [facility] to fax information to me so I can give to Dr. Lindi Adie before 4/27.

## 2014-10-13 ENCOUNTER — Encounter: Payer: Self-pay | Admitting: *Deleted

## 2014-10-13 NOTE — Progress Notes (Signed)
Late entry- Met with pt after her new pt appt with Dr. Sondra Come. Gave Alight back and discuss journey binder and navigation resources. Pt denies needs at this time. Encourage pt to call with questions or concerns. Received verbal understanding. Contact information given.

## 2014-10-16 ENCOUNTER — Ambulatory Visit: Payer: BLUE CROSS/BLUE SHIELD | Admitting: Hematology

## 2014-10-16 ENCOUNTER — Encounter (HOSPITAL_BASED_OUTPATIENT_CLINIC_OR_DEPARTMENT_OTHER): Payer: Self-pay | Admitting: *Deleted

## 2014-10-16 ENCOUNTER — Ambulatory Visit: Payer: BLUE CROSS/BLUE SHIELD

## 2014-10-16 NOTE — Progress Notes (Signed)
No labs needed

## 2014-10-17 NOTE — H&P (Signed)
Arkie B. Jentsch 10/02/2014 2:54 PM Location: [facility] Patient #: [id number] DOB: Mar 22, 1951 Married / Language: English / Race: White Female  History of Present Illness (Yasmin Bronaugh A. Ninfa Linden MD; 10/02/2014 3:22 PM) Patient words: breast cancer.  The patient is a 64 year old female who presents with breast cancer. This is a pleasant female referred by Dr. Dellis Filbert for evaluation of the recent diagnosis of right breast ductal carcinoma in situ. She had abnormal calcifications found on recent mammogram. She has had consultations biopsy the past as well as fibroadenomas that have all been benign. This is the first time the malignancy has been found. She is otherwise healthy without complaints. She has no breast pain and no nipple discharge. Her family history is fairly unremarkable regarding breast cancer   Other Problems Elbert Ewings, CMA; 10/02/2014 2:54 PM) Breast Cancer Lump In Breast  Past Surgical History Elbert Ewings, CMA; 10/02/2014 2:54 PM) Breast Biopsy Bilateral. multiple  Diagnostic Studies History Elbert Ewings, CMA; 10/02/2014 2:54 PM) Colonoscopy 1-5 years ago Mammogram within last year Pap Smear 1-5 years ago  Allergies Elbert Ewings, CMA; 10/02/2014 2:54 PM) No Known Drug Allergies04/09/2014  Medication History Elbert Ewings, CMA; 10/02/2014 2:56 PM) Calcium Carbonate (600MG  Tablet, Oral daily) Active. Vitamin D3 (1000UNIT Capsule, Oral daily) Active. Flonase (50MCG/ACT Suspension, Nasal) Active. Inulin Fiber Prebiotic (833.25MG  Capsule, Oral) Active. Multi Vitamin/Minerals (Oral daily) Active. Medications Reconciled  Social History Elbert Ewings, [place]; 10/02/2014 2:54 PM) Alcohol use Occasional alcohol use. Caffeine use Carbonated beverages, Coffee, Tea. No drug use Tobacco use Never smoker.  Family History Elbert Ewings, [place]; 10/02/2014 2:54 PM) Arthritis Mother. Cervical Cancer Mother. Colon Polyps Mother. Diabetes Mellitus Father. Heart  Disease Mother. Hypertension Father, Mother. Melanoma Mother. Prostate Cancer Father. Thyroid problems Mother.  Pregnancy / Birth History Elbert Ewings, CMA; 10/02/2014 2:54 PM) Age at menarche 32 years. Age of menopause 38-55 Gravida 2 Irregular periods Maternal age 24-30 Para 2  Review of Systems Elbert Ewings CMA; 10/02/2014 2:54 PM) General Not Present- Appetite Loss, Chills, Fatigue, Fever, Night Sweats, Weight Gain and Weight Loss. Skin Not Present- Change in Wart/Mole, Dryness, Hives, Jaundice, New Lesions, Non-Healing Wounds, Rash and Ulcer. HEENT Present- Wears glasses/contact lenses. Not Present- Earache, Hearing Loss, Hoarseness, Nose Bleed, Oral Ulcers, Ringing in the Ears, Seasonal Allergies, Sinus Pain, Sore Throat, Visual Disturbances and Yellow Eyes. Breast Present- Breast Mass. Not Present- Breast Pain, Nipple Discharge and Skin Changes. Cardiovascular Not Present- Chest Pain, Difficulty Breathing Lying Down, Leg Cramps, Palpitations, Rapid Heart Rate, Shortness of Breath and Swelling of Extremities. Gastrointestinal Not Present- Abdominal Pain, Bloating, Bloody Stool, Change in Bowel Habits, Chronic diarrhea, Constipation, Difficulty Swallowing, Excessive gas, Gets full quickly at meals, Hemorrhoids, Indigestion, Nausea, Rectal Pain and Vomiting. Female Genitourinary Not Present- Frequency, Nocturia, Painful Urination, Pelvic Pain and Urgency. Musculoskeletal Not Present- Back Pain, Joint Pain, Joint Stiffness, Muscle Pain, Muscle Weakness and Swelling of Extremities. Neurological Present- Numbness and Tingling. Not Present- Decreased Memory, Fainting, Headaches, Seizures, Tremor, Trouble walking and Weakness.   Vitals Elbert Ewings CMA; 10/02/2014 2:56 PM) 10/02/2014 2:56 PM Weight: 155 lb Height: 70in Body Surface Area: 1.86 m Body Mass Index: 22.24 kg/m Temp.: 97.69F(Temporal)  Pulse: 87 (Regular)  Resp.: 17 (Unlabored)  BP: 120/66 (Sitting, Left  Arm, Standard)    Physical Exam (Yocheved Depner A. Ninfa Linden MD; 10/02/2014 3:23 PM) General Mental Status-Alert. General Appearance-Consistent with stated age. Hydration-Well hydrated. Voice-Normal.  Head and Neck Head-normocephalic, atraumatic with no lesions or palpable masses. Trachea-midline. Thyroid Gland Characteristics - normal size  and consistency.  Eye Eyeball - Bilateral-Extraocular movements intact. Sclera/Conjunctiva - Bilateral-No scleral icterus.  Chest and Lung Exam Chest and lung exam reveals -quiet, even and easy respiratory effort with no use of accessory muscles and on auscultation, normal breath sounds, no adventitious sounds and normal vocal resonance. Inspection Chest Wall - Normal. Back - normal.  Breast Breast - Left-Symmetric and Biopsy scar, Non Tender, No Dimpling, No Inflammation, No Lumpectomy scars, No Mastectomy scars, No Peau d' Orange. Breast - Right-Symmetric and Biopsy scar, Non Tender, No Dimpling, No Inflammation, No Lumpectomy scars, No Mastectomy scars, No Peau d' Orange. Note: I can feel a very small hematoma at the biopsy site at the 7 o'clock position of the right breast Breast Lump-No Palpable Breast Mass.  Cardiovascular Cardiovascular examination reveals -normal heart sounds, regular rate and rhythm with no murmurs and normal pedal pulses bilaterally.  Abdomen Inspection Inspection of the abdomen reveals - No Hernias. Skin - Scar - no surgical scars. Palpation/Percussion Palpation and Percussion of the abdomen reveal - Soft, Non Tender, No Rebound tenderness, No Rigidity (guarding) and No hepatosplenomegaly. Auscultation Auscultation of the abdomen reveals - Bowel sounds normal.  Neurologic Neurologic evaluation reveals -alert and oriented x 3 with no impairment of recent or remote memory. Mental Status-Normal.  Musculoskeletal Normal Exam - Left-Upper Extremity Strength Normal and Lower Extremity  Strength Normal. Normal Exam - Right-Upper Extremity Strength Normal and Lower Extremity Strength Normal.  Lymphatic Head & Neck  General Head & Neck Lymphatics: Bilateral - Description - Normal. Axillary  General Axillary Region: Bilateral - Description - Normal. Tenderness - Non Tender. Femoral & Inguinal  Generalized Femoral & Inguinal Lymphatics: Bilateral - Description - Normal. Tenderness - Non Tender.    Assessment & Plan (Ziva Nunziata A. Ninfa Linden MD; 10/02/2014 3:24 PM) DCIS (DUCTAL CARCINOMA IN SITU) OF BREAST, RIGHT (233.0  D05.11) Impression: I explained the diagnosis to the patient and her husband in detail. I do not have the receptor studies back yet. Again, this is a 1 cm cluster of calcifications at the 7 o'clock position of the right breast 5 cm from the nipple. I discussed breasts conservation versus mastectomy. I discussed radioactive seed localized lumpectomy. She is interested in breast conservation. She will be referred to the cancer center for both medical and radiation oncology evaluation preoperatively. I will go ahead and do the orders for surgery. I discussed the risk of surgery which includes but is not limited to bleeding, infection, need for further surgery if margins are positive, DVT, cardiopulmonary issues, etc. She understands and wishes to proceed.

## 2014-10-18 ENCOUNTER — Ambulatory Visit (HOSPITAL_BASED_OUTPATIENT_CLINIC_OR_DEPARTMENT_OTHER): Payer: BLUE CROSS/BLUE SHIELD | Admitting: Anesthesiology

## 2014-10-18 ENCOUNTER — Ambulatory Visit (HOSPITAL_BASED_OUTPATIENT_CLINIC_OR_DEPARTMENT_OTHER)
Admission: RE | Admit: 2014-10-18 | Discharge: 2014-10-18 | Disposition: A | Discharge status: Home / Self Care | Payer: BLUE CROSS/BLUE SHIELD | Source: Ambulatory Visit | Attending: Surgery | Admitting: Surgery

## 2014-10-18 ENCOUNTER — Encounter (HOSPITAL_BASED_OUTPATIENT_CLINIC_OR_DEPARTMENT_OTHER)
Admission: RE | Disposition: A | Discharge status: Home / Self Care | Payer: Self-pay | Source: Ambulatory Visit | Attending: Surgery

## 2014-10-18 ENCOUNTER — Encounter (HOSPITAL_BASED_OUTPATIENT_CLINIC_OR_DEPARTMENT_OTHER): Payer: Self-pay | Admitting: *Deleted

## 2014-10-18 DIAGNOSIS — Z170 Estrogen receptor positive status [ER+]: Secondary | ICD-10-CM | POA: Insufficient documentation

## 2014-10-18 DIAGNOSIS — D0511 Intraductal carcinoma in situ of right breast: Secondary | ICD-10-CM | POA: Diagnosis not present

## 2014-10-18 DIAGNOSIS — R921 Mammographic calcification found on diagnostic imaging of breast: Secondary | ICD-10-CM | POA: Diagnosis not present

## 2014-10-18 DIAGNOSIS — Z79899 Other long term (current) drug therapy: Secondary | ICD-10-CM | POA: Diagnosis not present

## 2014-10-18 DIAGNOSIS — Z7951 Long term (current) use of inhaled steroids: Secondary | ICD-10-CM | POA: Insufficient documentation

## 2014-10-18 HISTORY — PX: BREAST LUMPECTOMY WITH RADIOACTIVE SEED LOCALIZATION: SHX6424

## 2014-10-18 HISTORY — DX: Presence of spectacles and contact lenses: Z97.3

## 2014-10-18 HISTORY — DX: Other specified postprocedural states: Z98.890

## 2014-10-18 HISTORY — DX: Nausea with vomiting, unspecified: R11.2

## 2014-10-18 HISTORY — DX: Other specified postprocedural states: R11.2

## 2014-10-18 LAB — POCT HEMOGLOBIN-HEMACUE: Hemoglobin: 11.1 g/dL — ABNORMAL LOW (ref 12.0–15.0)

## 2014-10-18 SURGERY — BREAST LUMPECTOMY WITH RADIOACTIVE SEED LOCALIZATION
Anesthesia: General | Site: Breast | Laterality: Right

## 2014-10-18 MED ORDER — OXYCODONE HCL 5 MG/5ML PO SOLN: 5.0000 mg | Freq: Once | ORAL | Status: DC | PRN

## 2014-10-18 MED ORDER — FENTANYL CITRATE (PF) 100 MCG/2ML IJ SOLN
INTRAMUSCULAR | Status: AC
  Filled 2014-10-18: qty 6

## 2014-10-18 MED ORDER — OXYCODONE HCL 5 MG PO TABS: 5.0000 mg | ORAL_TABLET | Freq: Once | ORAL | Status: DC | PRN

## 2014-10-18 MED ORDER — MIDAZOLAM HCL 2 MG/2ML IJ SOLN: 1.0000 mg | INTRAMUSCULAR | Status: DC | PRN

## 2014-10-18 MED ORDER — MORPHINE SULFATE 2 MG/ML IJ SOLN: 1.0000 mg | INTRAMUSCULAR | Status: DC | PRN

## 2014-10-18 MED ORDER — KETOROLAC TROMETHAMINE 30 MG/ML IJ SOLN
INTRAMUSCULAR | Status: DC | PRN
  Administered 2014-10-18: 30 mg via INTRAVENOUS

## 2014-10-18 MED ORDER — DEXAMETHASONE SODIUM PHOSPHATE 4 MG/ML IJ SOLN
INTRAMUSCULAR | Status: DC | PRN
  Administered 2014-10-18: 10 mg via INTRAVENOUS

## 2014-10-18 MED ORDER — MIDAZOLAM HCL 5 MG/5ML IJ SOLN
INTRAMUSCULAR | Status: DC | PRN
  Administered 2014-10-18: 2 mg via INTRAVENOUS

## 2014-10-18 MED ORDER — ONDANSETRON HCL 4 MG/2ML IJ SOLN
INTRAMUSCULAR | Status: DC | PRN
  Administered 2014-10-18: 4 mg via INTRAVENOUS

## 2014-10-18 MED ORDER — OXYCODONE HCL 5 MG PO TABS: 5.0000 mg | ORAL_TABLET | ORAL | Status: DC | PRN

## 2014-10-18 MED ORDER — MIDAZOLAM HCL 2 MG/2ML IJ SOLN
INTRAMUSCULAR | Status: AC
  Filled 2014-10-18: qty 2

## 2014-10-18 MED ORDER — HYDROCODONE-ACETAMINOPHEN 5-325 MG PO TABS: 1.0000 | ORAL_TABLET | ORAL | Status: DC | PRN

## 2014-10-18 MED ORDER — CEFAZOLIN SODIUM-DEXTROSE 2-3 GM-% IV SOLR
INTRAVENOUS | Status: AC
  Filled 2014-10-18: qty 50

## 2014-10-18 MED ORDER — SODIUM CHLORIDE 0.9 % IJ SOLN: 3.0000 mL | Freq: Two times a day (BID) | INTRAMUSCULAR | Status: DC

## 2014-10-18 MED ORDER — LACTATED RINGERS IV SOLN
INTRAVENOUS | Status: DC
  Administered 2014-10-18 (×2): via INTRAVENOUS

## 2014-10-18 MED ORDER — LIDOCAINE HCL (PF) 1 % IJ SOLN
INTRAMUSCULAR | Status: AC
  Filled 2014-10-18: qty 30

## 2014-10-18 MED ORDER — ACETAMINOPHEN 325 MG PO TABS: 650.0000 mg | ORAL_TABLET | ORAL | Status: DC | PRN

## 2014-10-18 MED ORDER — SODIUM CHLORIDE 0.9 % IJ SOLN: 3.0000 mL | INTRAMUSCULAR | Status: DC | PRN

## 2014-10-18 MED ORDER — PROPOFOL 10 MG/ML IV BOLUS
INTRAVENOUS | Status: AC
  Filled 2014-10-18: qty 80

## 2014-10-18 MED ORDER — SODIUM CHLORIDE 0.9 % IV SOLN: 250.0000 mL | INTRAVENOUS | Status: DC | PRN

## 2014-10-18 MED ORDER — PROPOFOL 10 MG/ML IV BOLUS
INTRAVENOUS | Status: DC | PRN
  Administered 2014-10-18: 200 mg via INTRAVENOUS
  Administered 2014-10-18: 100 mg via INTRAVENOUS

## 2014-10-18 MED ORDER — LIDOCAINE HCL (CARDIAC) 20 MG/ML IV SOLN
INTRAVENOUS | Status: DC | PRN
  Administered 2014-10-18: 50 mg via INTRAVENOUS

## 2014-10-18 MED ORDER — HYDROMORPHONE HCL 1 MG/ML IJ SOLN: 0.2500 mg | INTRAMUSCULAR | Status: DC | PRN

## 2014-10-18 MED ORDER — BUPIVACAINE-EPINEPHRINE (PF) 0.5% -1:200000 IJ SOLN
INTRAMUSCULAR | Status: AC
  Filled 2014-10-18: qty 30

## 2014-10-18 MED ORDER — CEFAZOLIN SODIUM-DEXTROSE 2-3 GM-% IV SOLR
2.0000 g | INTRAVENOUS | Status: AC
  Administered 2014-10-18: 2 g via INTRAVENOUS

## 2014-10-18 MED ORDER — ACETAMINOPHEN 650 MG RE SUPP: 650.0000 mg | RECTAL | Status: DC | PRN

## 2014-10-18 MED ORDER — FENTANYL CITRATE (PF) 100 MCG/2ML IJ SOLN
INTRAMUSCULAR | Status: DC | PRN
  Administered 2014-10-18: 100 ug via INTRAVENOUS

## 2014-10-18 MED ORDER — PROMETHAZINE HCL 25 MG/ML IJ SOLN: 6.2500 mg | INTRAMUSCULAR | Status: DC | PRN

## 2014-10-18 MED ORDER — BUPIVACAINE-EPINEPHRINE 0.5% -1:200000 IJ SOLN
INTRAMUSCULAR | Status: DC | PRN
Start: 2014-10-18 — End: 2014-10-18
  Administered 2014-10-18: 10 mL

## 2014-10-18 SURGICAL SUPPLY — 45 items
BLADE HEX COATED 2.75 (ELECTRODE) ×2 IMPLANT
BLADE SURG 15 STRL LF DISP TIS (BLADE) ×1 IMPLANT
BLADE SURG 15 STRL SS (BLADE) ×2
CANISTER SUCT 1200ML W/VALVE (MISCELLANEOUS) IMPLANT
CHLORAPREP W/TINT 26ML (MISCELLANEOUS) ×2 IMPLANT
CLIP TI WIDE RED SMALL 6 (CLIP) IMPLANT
COVER BACK TABLE 60X90IN (DRAPES) ×2 IMPLANT
COVER MAYO STAND STRL (DRAPES) ×2 IMPLANT
COVER PROBE W GEL 5X96 (DRAPES) ×2 IMPLANT
DECANTER SPIKE VIAL GLASS SM (MISCELLANEOUS) IMPLANT
DEVICE DUBIN W/COMP PLATE 8390 (MISCELLANEOUS) ×1 IMPLANT
DRAPE LAPAROTOMY 100X72 PEDS (DRAPES) ×2 IMPLANT
DRAPE UTILITY XL STRL (DRAPES) ×2 IMPLANT
DRSG TEGADERM 4X4.75 (GAUZE/BANDAGES/DRESSINGS) ×2 IMPLANT
ELECT REM PT RETURN 9FT ADLT (ELECTROSURGICAL) ×2
ELECTRODE REM PT RTRN 9FT ADLT (ELECTROSURGICAL) ×1 IMPLANT
GLOVE BIOGEL PI IND STRL 7.0 (GLOVE) IMPLANT
GLOVE BIOGEL PI INDICATOR 7.0 (GLOVE) ×1
GLOVE ECLIPSE 6.5 STRL STRAW (GLOVE) ×1 IMPLANT
GLOVE EXAM NITRILE MD LF STRL (GLOVE) ×1 IMPLANT
GLOVE SURG SIGNA 7.5 PF LTX (GLOVE) ×2 IMPLANT
GOWN STRL REUS W/ TWL LRG LVL3 (GOWN DISPOSABLE) ×1 IMPLANT
GOWN STRL REUS W/ TWL XL LVL3 (GOWN DISPOSABLE) ×1 IMPLANT
GOWN STRL REUS W/TWL LRG LVL3 (GOWN DISPOSABLE) ×2
GOWN STRL REUS W/TWL XL LVL3 (GOWN DISPOSABLE) ×4
KIT MARKER MARGIN INK (KITS) ×2 IMPLANT
LIQUID BAND (GAUZE/BANDAGES/DRESSINGS) ×2 IMPLANT
NDL HYPO 25X1 1.5 SAFETY (NEEDLE) ×1 IMPLANT
NEEDLE HYPO 25X1 1.5 SAFETY (NEEDLE) ×2 IMPLANT
NS IRRIG 1000ML POUR BTL (IV SOLUTION) ×2 IMPLANT
PACK BASIN DAY SURGERY FS (CUSTOM PROCEDURE TRAY) ×2 IMPLANT
PENCIL BUTTON HOLSTER BLD 10FT (ELECTRODE) ×2 IMPLANT
SLEEVE SCD COMPRESS KNEE MED (MISCELLANEOUS) ×1 IMPLANT
SPONGE GAUZE 4X4 12PLY STER LF (GAUZE/BANDAGES/DRESSINGS) ×2 IMPLANT
SPONGE LAP 4X18 X RAY DECT (DISPOSABLE) ×2 IMPLANT
STRIP CLOSURE SKIN 1/2X4 (GAUZE/BANDAGES/DRESSINGS) ×2 IMPLANT
SUT MNCRL AB 4-0 PS2 18 (SUTURE) ×2 IMPLANT
SUT SILK 2 0 SH (SUTURE) ×2 IMPLANT
SUT VIC AB 3-0 SH 27 (SUTURE) ×2
SUT VIC AB 3-0 SH 27X BRD (SUTURE) ×1 IMPLANT
SYR CONTROL 10ML LL (SYRINGE) ×2 IMPLANT
TOWEL OR 17X24 6PK STRL BLUE (TOWEL DISPOSABLE) ×2 IMPLANT
TOWEL OR NON WOVEN STRL DISP B (DISPOSABLE) ×2 IMPLANT
TUBE CONNECTING 20X1/4 (TUBING) IMPLANT
YANKAUER SUCT BULB TIP NO VENT (SUCTIONS) IMPLANT

## 2014-10-18 NOTE — Op Note (Signed)
Crystal [name], Crystal [name]            ACCOUNT NO.:  [id number]  MEDICAL RECORD NO.:  [id number]  LOCATION:                                 FACILITY:  PHYSICIAN:  Coralie Keens, M.D.      DATE OF BIRTH:  DATE OF PROCEDURE:  10/18/2014 DATE OF DISCHARGE:                              OPERATIVE REPORT   PREOPERATIVE DIAGNOSIS:  Right breast ductal carcinoma in situ.  POSTOPERATIVE DIAGNOSIS:  Right breast ductal carcinoma in situ.  PROCEDURE:  Right breast partial mastectomy with radioactive seed localization.  SURGEON:  Coralie Keens, M.D.  ANESTHESIA:  General and 0.5% Marcaine.  ESTIMATED BLOOD LOSS:  Minimal.  INDICATIONS:  This is a 64 year old female who was found to have abnormal calcifications in the right breast on mammography. Stereotactic biopsy was performed showing ductal carcinoma in situ.  The decision was made to proceed with a partial mastectomy with seed localization.  FINDINGS:  The radioactive seed and previously placed marker were located in the partial mastectomy specimen on postoperative mammography of the specimen.  PROCEDURE IN DETAIL:  The patient was identified in the holding area. The Neoprobe was used to confirm that the radioactive seed was located in the right breast.  She was then taken to the operating room and placed in a supine position on the operating room table.  General anesthesia was then induced.  Her right breast was then prepped and draped in the usual sterile fashion.  The radioactive seed was located in the lower outer quadrant of the breast.  I anesthetized the skin with Marcaine and then performed an elliptical incision around the scar from the biopsy with a scalpel.  I then took this down to the breast tissue with the electrocautery.  I then performed a partial mastectomy going all the way down to the chest wall with the aid of the Neoprobe.  It appeared to be widely around the abnormal calcifications and the radioactive  seed by the aid of the probe.  Once I excised most of the tissue in the lower outer quadrant, I placed a silk suture anteriorly and used this to elevate the specimens.  I could then completely come under need with the cautery.  Once the specimen was removed, I marked all quadrants with paint.  X-ray was then performed confirming that the radioactive seed and previous marker were in the biopsy specimen.  I then anesthetized the wound further with Marcaine.  Hemostasis appeared to be achieved with the cautery.  I then placed surgical clips around the periphery of the biopsy cavity.  I then closed subcutaneous tissue with interrupted 3- 0 Vicryl sutures and closed the skin with a running 4-0 Monocryl. Dermabond was then applied.  The patient tolerated the procedure well. All the counts were correct at the end of the procedure.  The patient was then extubated in the operating room and taken in a stable condition to the recovery room.     Coralie Keens, M.D.     DB/MEDQ  D:  10/18/2014  T:  10/18/2014  Job:  [id number]

## 2014-10-18 NOTE — Anesthesia Preprocedure Evaluation (Addendum)
Anesthesia Evaluation  Patient identified by MRN, date of birth, ID band Patient awake    Reviewed: Allergy & Precautions, NPO status , Patient's Chart, lab work & pertinent test results  History of Anesthesia Complications (+) PONV  Airway Mallampati: II  TM Distance: >3 FB Neck ROM: Full    Dental  (+) Teeth Intact, Dental Advisory Given   Pulmonary neg pulmonary ROS,  breath sounds clear to auscultation        Cardiovascular negative cardio ROS  Rhythm:Regular Rate:Normal     Neuro/Psych negative neurological ROS     GI/Hepatic negative GI ROS, Neg liver ROS,   Endo/Other  negative endocrine ROS  Renal/GU negative Renal ROS     Musculoskeletal   Abdominal   Peds  Hematology negative hematology ROS (+)   Anesthesia Other Findings   Reproductive/Obstetrics                            Anesthesia Physical Anesthesia Plan  ASA: II  Anesthesia Plan: General   Post-op Pain Management:    Induction: Intravenous  Airway Management Planned: LMA  Additional Equipment:   Intra-op Plan:   Post-operative Plan:   Informed Consent: I have reviewed the patients History and Physical, chart, labs and discussed the procedure including the risks, benefits and alternatives for the proposed anesthesia with the patient or authorized representative who has indicated his/her understanding and acceptance.     Plan Discussed with: CRNA  Anesthesia Plan Comments:        Anesthesia Quick Evaluation

## 2014-10-18 NOTE — Discharge Instructions (Signed)
[facility] Office Phone Number [phone]  BREAST BIOPSY/ PARTIAL MASTECTOMY: POST OP INSTRUCTIONS  Always review your discharge instruction sheet given to you by the facility where your surgery was performed.  IF YOU HAVE DISABILITY OR FAMILY LEAVE FORMS, YOU MUST BRING THEM TO THE OFFICE FOR PROCESSING.  DO NOT GIVE THEM TO YOUR DOCTOR.  1. A prescription for pain medication may be given to you upon discharge.  Take your pain medication as prescribed, if needed.  If narcotic pain medicine is not needed, then you may take acetaminophen (Tylenol) or ibuprofen (Advil) as needed. 2. Take your usually prescribed medications unless otherwise directed 3. If you need a refill on your pain medication, please contact your pharmacy.  They will contact our office to request authorization.  Prescriptions will not be filled after 5pm or on week-ends. 4. You should eat very light the first 24 hours after surgery, such as soup, crackers, pudding, etc.  Resume your normal diet the day after surgery. 5. Most patients will experience some swelling and bruising in the breast.  Ice packs and a good support bra will help.  Swelling and bruising can take several days to resolve.  6. It is common to experience some constipation if taking pain medication after surgery.  Increasing fluid intake and taking a stool softener will usually help or prevent this problem from occurring.  A mild laxative (Milk of Magnesia or Miralax) should be taken according to package directions if there are no bowel movements after 48 hours. 7. Unless discharge instructions indicate otherwise, you may remove your bandages 24-48 hours after surgery, and you may shower at that time.  You may have steri-strips (small skin tapes) in place directly over the incision.  These strips should be left on the skin for 7-10 days.  If your surgeon used skin glue on the incision, you may shower in 24 hours.  The glue will flake off over the  next 2-3 weeks.  Any sutures or staples will be removed at the office during your follow-up visit. 8. ACTIVITIES:  You may resume regular daily activities (gradually increasing) beginning the next day.  Wearing a good support bra or sports bra minimizes pain and swelling.  You may have sexual intercourse when it is comfortable. a. You may drive when you no longer are taking prescription pain medication, you can comfortably wear a seatbelt, and you can safely maneuver your car and apply brakes. b. RETURN TO WORK:  ______________________________________________________________________________________ 9. You should see your doctor in the office for a follow-up appointment approximately two weeks after your surgery.  Your doctors nurse will typically make your follow-up appointment when she calls you with your pathology report.  Expect your pathology report 2-3 business days after your surgery.  You may call to check if you do not hear from [name] after three days. 10. OTHER INSTRUCTIONS: _____ok to shower tomorrow 11.  ice pack and ibuprofen also for pain__________________________________________________________________________________________ _____________________________________________________________________________________________________________________________________ _____________________________________________________________________________________________________________________________________ _____________________________________________________________________________________________________________________________________  WHEN TO CALL YOUR DOCTOR: 1. Fever over 101.0 2. Nausea and/or vomiting. 3. Extreme swelling or bruising. 4. Continued bleeding from incision. 5. Increased pain, redness, or drainage from the incision.  The clinic staff is available to answer your questions during regular business hours.  Please dont hesitate to call and ask to speak to one of the nurses for clinical  concerns.  If you have a medical emergency, go to the nearest emergency room or call 911.  A surgeon from [facility] is always on  call at the hospital.  For further questions, please visit [email or web address]   Post Anesthesia Home Care Instructions  Activity: Get plenty of rest for the remainder of the day. A responsible adult should stay with you for 24 hours following the procedure.  For the next 24 hours, DO NOT: -Drive a car -Paediatric nurse -Drink alcoholic beverages -Take any medication unless instructed by your physician -Make any legal decisions or sign important papers.  Meals: Start with liquid foods such as gelatin or soup. Progress to regular foods as tolerated. Avoid greasy, spicy, heavy foods. If nausea and/or vomiting occur, drink only clear liquids until the nausea and/or vomiting subsides. Call your physician if vomiting continues.  Special Instructions/Symptoms: Your throat may feel dry or sore from the anesthesia or the breathing tube placed in your throat during surgery. If this causes discomfort, gargle with warm salt water. The discomfort should disappear within 24 hours.  If you had a scopolamine patch placed behind your ear for the management of post- operative nausea and/or vomiting:  1. The medication in the patch is effective for 72 hours, after which it should be removed.  Wrap patch in a tissue and discard in the trash. Wash hands thoroughly with soap and water. 2. You may remove the patch earlier than 72 hours if you experience unpleasant side effects which may include dry mouth, dizziness or visual disturbances. 3. Avoid touching the patch. Wash your hands with soap and water after contact with the patch.

## 2014-10-18 NOTE — Anesthesia Procedure Notes (Signed)
Procedure Name: LMA Insertion Date/Time: 10/18/2014 8:26 AM Performed by: Toula Moos L Pre-anesthesia Checklist: Patient identified, Emergency Drugs available, Suction available, Patient being monitored and Timeout performed Patient Re-evaluated:Patient Re-evaluated prior to inductionOxygen Delivery Method: Circle System Utilized Preoxygenation: Pre-oxygenation with 100% oxygen Intubation Type: IV induction Ventilation: Mask ventilation without difficulty LMA: LMA inserted LMA Size: 4.0 Number of attempts: 1 Airway Equipment and Method: Bite block Placement Confirmation: positive ETCO2 Tube secured with: Tape Dental Injury: Teeth and Oropharynx as per pre-operative assessment

## 2014-10-18 NOTE — Transfer of Care (Signed)
Immediate Anesthesia Transfer of Care Note  Patient: Crystal [name]  Procedure(s) Performed: Procedure(s): BREAST LUMPECTOMY WITH RADIOACTIVE SEED LOCALIZATION (Right)  Patient Location: PACU  Anesthesia Type:General  Level of Consciousness: sedated and patient cooperative  Airway & Oxygen Therapy: Patient Spontanous Breathing and Patient connected to face mask oxygen  Post-op Assessment: Report given to RN and Post -op Vital signs reviewed and stable  Post vital signs: Reviewed and stable  Last Vitals:  Filed Vitals:   10/18/14 0738  BP: 120/73  Pulse: 82  Temp: 36.6 C  Resp: 18    Complications: No apparent anesthesia complications

## 2014-10-18 NOTE — Anesthesia Postprocedure Evaluation (Signed)
  Anesthesia Post-op Note  Patient: Crystal [name]  Procedure(s) Performed: Procedure(s): BREAST LUMPECTOMY WITH RADIOACTIVE SEED LOCALIZATION (Right)  Patient Location: PACU  Anesthesia Type:General  Level of Consciousness: awake and alert   Airway and Oxygen Therapy: Patient Spontanous Breathing  Post-op Pain: mild  Post-op Assessment: Post-op Vital signs reviewed  Post-op Vital Signs: Reviewed  Last Vitals:  Filed Vitals:   10/18/14 1025  BP: 120/72  Pulse: 71  Temp: 36.6 C  Resp: 20    Complications: No apparent anesthesia complications

## 2014-10-18 NOTE — Op Note (Signed)
BREAST LUMPECTOMY WITH RADIOACTIVE SEED LOCALIZATION  Procedure Note  Crystal [name] 10/18/2014   Pre-op Diagnosis: DCIS Right Breast     Post-op Diagnosis: same  Procedure(s): RIGHT BREAST PARTIAL MASTECTOMY WITH  RADIOACTIVE SEED LOCALIZATION  Surgeon(s): Coralie Keens, MD  Anesthesia: General  Staff:  Circulator: Eli Phillips, RN Scrub Person: Tresa Res, RN  Estimated Blood Loss: Minimal               Specimens: SENT TO PATH          Coralie Keens A   Date: 10/18/2014  Time: 8:57 AM

## 2014-10-18 NOTE — Interval H&P Note (Signed)
History and Physical Interval Note:no change in H and P  10/18/2014 7:49 AM  Crystal [name]  has presented today for surgery, with the diagnosis of DCIS Right Breast  The various methods of treatment have been discussed with the patient and family. After consideration of risks, benefits and other options for treatment, the patient has consented to  Procedure(s): BREAST LUMPECTOMY WITH RADIOACTIVE SEED LOCALIZATION (Right) as a surgical intervention .  The patient's history has been reviewed, patient examined, no change in status, stable for surgery.  I have reviewed the patient's chart and labs.  Questions were answered to the patient's satisfaction.     [name]

## 2014-10-20 ENCOUNTER — Encounter (HOSPITAL_BASED_OUTPATIENT_CLINIC_OR_DEPARTMENT_OTHER): Payer: Self-pay | Admitting: Surgery

## 2014-10-24 ENCOUNTER — Encounter: Payer: Self-pay | Admitting: Internal Medicine

## 2014-10-24 ENCOUNTER — Ambulatory Visit (INDEPENDENT_AMBULATORY_CARE_PROVIDER_SITE_OTHER): Payer: BLUE CROSS/BLUE SHIELD | Admitting: Internal Medicine

## 2014-10-24 VITALS — BP 106/80 | HR 84 | Temp 98.0°F | Ht 70.0 in | Wt 153.0 lb

## 2014-10-24 DIAGNOSIS — J0110 Acute frontal sinusitis, unspecified: Secondary | ICD-10-CM

## 2014-10-24 DIAGNOSIS — C50511 Malignant neoplasm of lower-outer quadrant of right female breast: Secondary | ICD-10-CM | POA: Insufficient documentation

## 2014-10-24 DIAGNOSIS — H6123 Impacted cerumen, bilateral: Secondary | ICD-10-CM

## 2014-10-24 DIAGNOSIS — Z853 Personal history of malignant neoplasm of breast: Secondary | ICD-10-CM | POA: Insufficient documentation

## 2014-10-24 HISTORY — DX: Malignant neoplasm of lower-outer quadrant of right female breast: C50.511

## 2014-10-24 MED ORDER — AMOXICILLIN 500 MG PO CAPS: 500.0000 mg | ORAL_CAPSULE | Freq: Three times a day (TID) | ORAL | Status: DC

## 2014-10-24 NOTE — Assessment & Plan Note (Signed)
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 After reviewing the pathology in detail, we proceeded to discuss the different treatment options between surgery, radiation, chemotherapy, antiestrogen therapies.  Recommendation: 1. Adjuvant XRT followed by 2. Adjuvant Tamoxifen once daily x 5 yrs  Tamoxifen counseling: We discussed the risks and benefits of tamoxifen. These include but not limited to insomnia, hot flashes, mood changes, vaginal dryness, and weight gain. Although rare, serious side effects including endometrial cancer, risk of blood clots were also discussed. We strongly believe that the benefits far outweigh the risks. Patient understands these risks and consented to starting treatment. Planned treatment duration is 5 years.

## 2014-10-24 NOTE — Progress Notes (Signed)
   Subjective:    Patient ID: Crystal [name], female    DOB: 1950-12-20, 64 y.o.   MRN: [id number]  HPI Her symptoms began 10/22/14 as a nonproductive cough and sore throat. Intermittently she will produce green streaks in the sputum as of today. The cough has not been disrupting sleep. She does have nasal congestion, nasal obstruction, and facial pain. There is significant frontal headache as well as fatigue. With a sore throat she's had dramatic hoarseness. This is in the context of postnasal drainage. She's had itchy, watery eyes but this has improved. She also has had low-grade fever. The fever has been associated with chills.  She's been using Flonase; her technique was reviewed. Advil has helped the headache and fever.  DCIS ;seeing Dr Rush Farmer next week.  Review of Systems The cough is not associated with shortness of breath or wheezing. She's had buildup of earwax but no otic pain or otic discharge.     Objective:   Physical Exam  General appearance:Adequately nourished; no acute distress or increased work of breathing is present.    Lymphatic: No  lymphadenopathy about the head, neck, or axilla .  Eyes: No conjunctival inflammation or lid edema is present. There is no scleral icterus.  Ears:  External ear exam shows no significant lesions or deformities.  Wax impactions bilaterally.  Nose:  External nasal examination shows no deformity or inflammation. Nasal mucosa are erythematous without lesions or exudates No septal dislocation or deviation.No obstruction to airflow.   Oral exam: Dental hygiene is good; lips and gums are healthy appearing.There is no oropharyngeal erythema or exudate .Markedly hoarse. Neck:  No deformities, thyromegaly, masses, or tenderness noted.   Supple with full range of motion without pain.   Heart:  Normal rate and regular rhythm. S1 and S2 normal without gallop, murmur, click, rub or other extra sounds.   Lungs:Chest clear to auscultation; no  wheezes, rhonchi,rales ,or rubs present.  Extremities:  No cyanosis, edema, or clubbing  noted    Skin: Warm & dry w/o tenting or jaundice. No significant lesions or rash.       Assessment & Plan:  #1 rhinosinusitis without significant bronchitis #2 bilateral wax impactions  Plan: Nasal hygiene interventions discussed. See prescription medications

## 2014-10-24 NOTE — Patient Instructions (Addendum)
Plain Mucinex (NOT D) for thick secretions ;force NON dairy fluids .   Nasal cleansing in the shower as discussed with lather of mild shampoo.After 10 seconds wash off lather while  exhaling through nostrils. Make sure that all residual soap is removed to prevent irritation.  Flonase OR Nasacort AQ 1 spray in each nostril twice a day as needed. Use the "crossover" technique into opposite nostril spraying toward opposite ear @ 45 degree angle, not straight up into nostril.  Plain Allegra (NOT D )  160 daily , Loratidine 10 mg , OR Zyrtec 10 mg @ bedtime  as needed for itchy eyes & sneezing.Zicam Melts or Zinc lozenges as per package label for sore throat .  Zicam Melts or Zinc lozenges as per package label for sore throat .    Please do not use Q-tips as this simply packs the wax down against he eardrum. Should wax build up occur, please put 2-3 drops of mineral oil in the ear at night and cover the canal with a  cotton ball.In the morning fill the canal with hydrogen peroxide & leave  for 10-15 minutes.Following this shower and use the thinnest washrag available to wick out the wax.

## 2014-10-24 NOTE — Progress Notes (Signed)
Pre visit review using our clinic review tool, if applicable. No additional management support is needed unless otherwise documented below in the visit note. 

## 2014-10-25 ENCOUNTER — Ambulatory Visit: Payer: BLUE CROSS/BLUE SHIELD

## 2014-10-25 ENCOUNTER — Ambulatory Visit (HOSPITAL_BASED_OUTPATIENT_CLINIC_OR_DEPARTMENT_OTHER): Payer: BLUE CROSS/BLUE SHIELD | Admitting: Hematology and Oncology

## 2014-10-25 ENCOUNTER — Telehealth: Payer: Self-pay | Admitting: Hematology and Oncology

## 2014-10-25 ENCOUNTER — Encounter: Payer: Self-pay | Admitting: Hematology and Oncology

## 2014-10-25 VITALS — BP 118/69 | HR 86 | Temp 99.8°F | Resp 18 | Ht 70.0 in | Wt 154.4 lb

## 2014-10-25 DIAGNOSIS — Z170 Estrogen receptor positive status [ER+]: Secondary | ICD-10-CM | POA: Diagnosis not present

## 2014-10-25 DIAGNOSIS — C50511 Malignant neoplasm of lower-outer quadrant of right female breast: Secondary | ICD-10-CM | POA: Diagnosis not present

## 2014-10-25 MED ORDER — TAMOXIFEN CITRATE 20 MG PO TABS: 20.0000 mg | ORAL_TABLET | Freq: Every day | ORAL | Status: DC

## 2014-10-25 NOTE — Progress Notes (Signed)
[facility] NOTE  Patient Care Team: [name], MD as PCP - General (Internal Medicine)  CHIEF COMPLAINTS/PURPOSE OF CONSULTATION:  Newly diagnosed right breast DCIS  HISTORY OF PRESENTING ILLNESS:  Crystal [name] 64 y.o. female is here because of recent diagnosis of right breast DCIS. In March 2016 she underwent a routine screening mammogram that revealed a cluster of calcification 1 cm in size at 7:00 position the right breast followed by ultrasound. She did undergo biopsy of the left breast that came back as DCIS. ER 97% PR 67% positive. She underwent right breast lumpectomy on 10/18/2014 that revealed grade 1 DCIS without necrosis. She has been sent to [name] for discussion regarding adjuvant antiestrogen therapy options. She is recovered very well from surgery and appears to be doing very well.  I reviewed her records extensively and collaborated the history with the patient.  SUMMARY OF ONCOLOGIC HISTORY:   Breast cancer of lower-outer quadrant of right female breast   10/18/2014 Surgery Rt Lumpectomy: DCIS Grade 1 ER 97%, PR 67%, 2.6 cm Size, no necrosis, [facility] nomogram risk of recurrence 2% at 10 years with antiestrogen therapy and radiation    In terms of breast cancer risk profile:  She menarched at early age of 27 and went to menopause at age 53  She had 2 pregnancy, her first child was born at age 64  She has received birth control pills for approximately 3 years.  She was never exposed to fertility medications or hormone replacement therapy.  She has no family history of Breast/GYN/GI cancer  MEDICAL HISTORY:  Past Medical History  Diagnosis Date  . BACK PAIN, LUMBAR   . [place], BREAST 2006  . PARESTHESIA     toes  . Breast cancer, right breast   . PONV (postoperative nausea and vomiting)   . Wears glasses     SURGICAL HISTORY: Past Surgical History  Procedure Laterality Date  . Sterotactix needle biopsy  2000    breast  calcification  . D & c and hysteroscopy  11/1998    for uterine polyp  . Atrophic vaginitis    . Colonoscopy  09/05/2013  . Dilation and curettage of uterus    . Breast lumpectomy with radioactive seed localization Right 10/18/2014    Procedure: BREAST LUMPECTOMY WITH RADIOACTIVE SEED LOCALIZATION;  Surgeon: Coralie Keens, MD;  Location: [facility];  Service: General;  Laterality: Right;    SOCIAL HISTORY: History   Social History  . Marital Status: Married    Spouse Name: howard  . Number of Children: 2  . Years of Education: 16   Occupational History  . [employer]    Social History Main Topics  . Smoking status: Never Smoker   . Smokeless tobacco: Never Used  . Alcohol Use: 2.5 oz/week    5 Standard drinks or equivalent per week     Comment: beer  . Drug Use: No  . Sexual Activity:    Partners: Male   Other Topics Concern  . Not on file   Social History Narrative   HSG, [place], Married '78,  1 son '81, 1 daughter '84, 1 grandson '12. 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 ACP - discussed with patient. Refer - [email or web address]    FAMILY HISTORY: Family History  Problem Relation Age of Onset  . Hypothyroidism Mother   . GER disease Mother   . Osteoporosis Mother   . Hypertension Mother   .  Cancer Mother     hodgkins lympoma, uterine cancer  . Hyperlipidemia Father   . Hypertension Father   . Diabetes Father   . Coronary artery disease Father   . Coronary artery disease Maternal Grandfather   . COPD Neg Hx   . Asthma Neg Hx   . Colon cancer Neg Hx   . Esophageal cancer Neg Hx   . Rectal cancer Neg Hx   . Stomach cancer Neg Hx     ALLERGIES:  has No Known Allergies.  MEDICATIONS:  Current Outpatient Prescriptions  Medication Sig Dispense Refill  . amoxicillin (AMOXIL) 500 MG capsule Take 1 capsule (500 mg total) by mouth 3 (three) times daily. 30 capsule 0  . Calcium Carbonate-Vitamin D  (CALCIUM 600-D) 600-400 MG-UNIT per tablet Take 2 tablets by mouth daily.      . Cholecalciferol (VITAMIN D3) 1000 UNITS CAPS Take by mouth daily.      . fluticasone (FLONASE) 50 MCG/ACT nasal spray Place into both nostrils daily.    . Inulin (FIBER CHOICE FRUITY BITES PO) Take by mouth.    . Multiple Vitamin (MULTIVITAMIN) tablet Take 1 tablet by mouth daily.      . tamoxifen (NOLVADEX) 20 MG tablet Take 1 tablet (20 mg total) by mouth daily. 90 tablet 3   No current facility-administered medications for this visit.    REVIEW OF SYSTEMS:   Constitutional: Denies fevers, chills or abnormal night sweats Eyes: Denies blurriness of vision, double vision or watery eyes Ears, nose, mouth, throat, and face: Denies mucositis or sore throat Respiratory: Denies cough, dyspnea or wheezes Cardiovascular: Denies palpitation, chest discomfort or lower extremity swelling Gastrointestinal:  Denies nausea, heartburn or change in bowel habits Skin: Denies abnormal skin rashes Lymphatics: Denies new lymphadenopathy or easy bruising Neurological:Denies numbness, tingling or new weaknesses Behavioral/Psych: Mood is stable, no new changes  Breast:  Denies any palpable lumps or discharge All other systems were reviewed with the patient and are negative.  PHYSICAL EXAMINATION: ECOG PERFORMANCE STATUS: 0 - Asymptomatic  Filed Vitals:   10/25/14 0744  BP: 118/69  Pulse: 86  Temp: 99.8 F (37.7 C)  Resp: 18   Filed Weights   10/25/14 0744  Weight: 154 lb 6 oz (70.024 kg)    GENERAL:alert, no distress and comfortable SKIN: skin color, texture, turgor are normal, no rashes or significant lesions EYES: normal, conjunctiva are pink and non-injected, sclera clear OROPHARYNX:no exudate, no erythema and lips, buccal mucosa, and tongue normal  NECK: supple, thyroid normal size, non-tender, without nodularity LYMPH:  no palpable lymphadenopathy in the cervical, axillary or inguinal LUNGS: clear to  auscultation and percussion with normal breathing effort HEART: regular rate & rhythm and no murmurs and no lower extremity edema ABDOMEN:abdomen soft, non-tender and normal bowel sounds Musculoskeletal:no cyanosis of digits and no clubbing  PSYCH: alert & oriented x 3 with fluent speech NEURO: no focal motor/sensory deficits  LABORATORY DATA:  I have reviewed the data as listed Lab Results  Component Value Date   WBC 6.3 03/30/2013   HGB 11.1* 10/18/2014   HCT 36.5 03/30/2013   MCV 90.7 03/30/2013   PLT 240.0 03/30/2013   Lab Results  Component Value Date   NA 138 07/20/2014   K 4.3 07/20/2014   CL 103 07/20/2014   CO2 30 07/20/2014    RADIOGRAPHIC STUDIES: I have personally reviewed the radiological reports and agreed with the findings in the report.  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 After reviewing the pathology in detail, we proceeded to discuss the different treatment options between surgery, radiation, chemotherapy, antiestrogen therapies.  Recommendation: 1. Adjuvant XRT followed by 2. Adjuvant Tamoxifen once daily x 5 yrs  Tamoxifen counseling: We discussed the risks and benefits of tamoxifen. These include but not limited to insomnia, hot flashes, mood changes, vaginal dryness, and weight gain. Although rare, serious side effects including endometrial cancer, risk of blood clots were also discussed. We strongly believe that the benefits far outweigh the risks. Patient understands these risks and consented to starting treatment. Planned treatment duration is 5 years.  [facility] Prognostic Nomogram: Predicts 10 yr ROR at 2% with Tamoxifen and radiation.  I provided  her with a prescription for tamoxifen that she will start 2 weeks after completion of radiation therapy. I would like to see her one month after starting antiestrogen therapy in the second week of August.  All questions were answered. The patient knows to call the clinic with any problems, questions or concerns.    Rulon Eisenmenger, MD 8:26 AM

## 2014-10-25 NOTE — Progress Notes (Signed)
Checked in new pt with no financial concerns prior to seeing the dr.  Pt has my card for any billing questions or concerns. ° °

## 2014-10-25 NOTE — Telephone Encounter (Signed)
Called and left a message with 01/2015 appointment

## 2014-10-26 NOTE — Addendum Note (Signed)
Addended by: Renford Dills on: 10/26/2014 02:08 PM   Modules accepted: Orders, Medications

## 2014-10-30 NOTE — Progress Notes (Signed)
Location of Breast Cancer: DCIS right breast  Histology per Pathology Report:   09/20/14   Receptor Status: ER(97%+), PR (67%+), Her2-neu (not found)  Did patient present with symptoms (if so, please note symptoms) or was this found on screening mammography?: abnormal calcifications present on mammogram from 09/08/14  Past/Anticipated interventions by surgeon, if any: 10/18/14 - Procedure: BREAST LUMPECTOMY WITH RADIOACTIVE SEED LOCALIZATION;  Surgeon: Douglas Blackman, MD;  Location: [facility];  Service: General;  Laterality: Right;   Past/Anticipated interventions by medical oncology, if any: Adjuvant XRT followed by adjuvant Tamoxifen once daily x 5 yrs to be started 2 weeks after XRT is completed.  Lymphedema issues, if any: no  Pain issues, if any: no   OB GYN history: age at menarche 16 years, age of menopause 51-55, gravida 2, para 2, maternal age 27-30, used bcp for 3 years, has not taken hormone replacement.  SAFETY ISSUES:  Prior radiation? no  Pacemaker/ICD? no  Possible current pregnancy?no  Is the patient on methotrexate? no  Current Complaints / other details: Patient is here with her husband.            

## 2014-11-09 ENCOUNTER — Ambulatory Visit
Admission: RE | Admit: 2014-11-09 | Discharge: 2014-11-09 | Disposition: A | Discharge status: Home / Self Care | Payer: BLUE CROSS/BLUE SHIELD | Source: Ambulatory Visit | Attending: Radiation Oncology | Admitting: Radiation Oncology

## 2014-11-09 ENCOUNTER — Encounter: Payer: Self-pay | Admitting: Radiation Oncology

## 2014-11-09 VITALS — BP 112/76 | HR 76 | Temp 98.3°F | Resp 16 | Ht 70.0 in | Wt 153.3 lb

## 2014-11-09 DIAGNOSIS — C50511 Malignant neoplasm of lower-outer quadrant of right female breast: Secondary | ICD-10-CM

## 2014-11-09 NOTE — Progress Notes (Signed)
Please see the Nurse Progress Note in the MD Initial Consult Encounter for this patient. 

## 2014-11-09 NOTE — Progress Notes (Signed)
Radiation Oncology         (336) [phone] ________________________________  Name: Crystal [name] MRN: [id number]  Date: 11/09/2014  DOB: 06-Mar-1951  Re-Evaluation Note  CC: [name], MD  [name], MD   Diagnosis:   Intraductal carcinoma of the right breast  Narrative:  The patient returns today for further evaluation. On April 20 patient proceeded to undergo a partial mastectomy of the right breast. She has done quite well since her surgery. Denies any discharge from tissue and no discomfort. Denies any pain and took the pain medicine once for preventive measures but nothing since surgery. Pathology from the patient's surgery revealed low-grade intraductal carcinoma with calcifications. The surgical margins were clear but close along the posterior margin at 0.5 mm.  tumor was estimated to be 2.6 cm in size. Patient has met with medical oncology was recommended adjuvant hormonal therapy after radiation completion.                              ALLERGIES:  has No Known Allergies.  Meds: Current Outpatient Prescriptions  Medication Sig Dispense Refill  . Calcium Carbonate-Vitamin D (CALCIUM 600-D) 600-400 MG-UNIT per tablet Take 2 tablets by mouth daily.      . Cholecalciferol (VITAMIN D3) 1000 UNITS CAPS Take by mouth daily.      . fluticasone (FLONASE) 50 MCG/ACT nasal spray Place into both nostrils daily.    . Inulin (FIBER CHOICE FRUITY BITES PO) Take by mouth.    . Multiple Vitamin (MULTIVITAMIN) tablet Take 1 tablet by mouth daily.      . tamoxifen (NOLVADEX) 20 MG tablet Take 1 tablet (20 mg total) by mouth daily. (Patient not taking: Reported on 11/09/2014) 90 tablet 3   No current facility-administered medications for this encounter.    Physical Findings: The patient is in no acute distress. Patient is alert and oriented.  Accompanied by husband  height is '5\' 10"'  (1.778 m) and weight is 153 lb 4.8 oz (69.536 kg). Her oral temperature is 98.3 F (36.8  C). Her blood pressure is 112/76 and her pulse is 76. Her respiration is 16. .  The right breast area shows a horizontal scar in the lower outer quadrant which is healing well without signs of drainage or infection. No palpable mass within the right breast.   Lab Findings: Lab Results  Component Value Date   WBC 6.3 03/30/2013   HGB 11.1* 10/18/2014   HCT 36.5 03/30/2013   MCV 90.7 03/30/2013   PLT 240.0 03/30/2013    Radiographic Findings: No results found.  Impression: Intraductal carcinoma of the right breast. The patient has a clear but extremely close posterior margin. I discussed with the patient options for management including reexcision versus radiation therapy with a boost. I also discussed the patient's case with my colleagues who also agreed with recommendations for pre-radiation mammogram to ensure that all calcifications have been removed.  Plan: The patient will be set up for a pre-radiation right digital diagnostic mammogram at the [facility]. She will then proceed with simulation and treatment assuming there are no residual calcifications within the right breast.  This document serves as a record of services personally performed by Gery Pray, MD. It was created on his behalf by Jeralene Peters, a trained medical scribe. The creation of this record is based on the scribe's personal observations and the provider's statements to them. This document has been checked and approved  by the attending provider.     ____________________________________ Blair Promise, PhD, MD

## 2014-11-10 ENCOUNTER — Telehealth: Payer: Self-pay | Admitting: *Deleted

## 2014-11-10 NOTE — Telephone Encounter (Signed)
Called patient to inform of mammogram @ [facility] on 11-13-14 @ 3 p.m., lvm for a return call

## 2014-11-14 ENCOUNTER — Ambulatory Visit
Admission: RE | Admit: 2014-11-14 | Discharge: 2014-11-14 | Disposition: A | Discharge status: Home / Self Care | Payer: BLUE CROSS/BLUE SHIELD | Source: Ambulatory Visit | Attending: Radiation Oncology | Admitting: Radiation Oncology

## 2014-11-14 DIAGNOSIS — C50511 Malignant neoplasm of lower-outer quadrant of right female breast: Secondary | ICD-10-CM | POA: Diagnosis not present

## 2014-11-14 NOTE — Progress Notes (Signed)
  Radiation Oncology         [phone]) [phone] ________________________________  Name: Crystal [name] MRN: [id number]  Date: 11/14/2014  DOB: 1950-10-05  SIMULATION AND TREATMENT PLANNING NOTE    ICD-9-CM ICD-10-CM   1. Breast cancer of lower-outer quadrant of right female breast 174.5 C50.511     DIAGNOSIS:  Intraductal carcinoma of the right breast  NARRATIVE:  The patient was brought to the [facility].  Identity was confirmed.  All relevant records and images related to the planned course of therapy were reviewed.  The patient freely provided informed written consent to proceed with treatment after reviewing the details related to the planned course of therapy. The consent form was witnessed and verified by the simulation staff.  Then, the patient was set-up in a stable reproducible  supine position for radiation therapy.  CT images were obtained.  Surface markings were placed.  The CT images were loaded into the planning software.  Then the target and avoidance structures were contoured.  Treatment planning then occurred.  The radiation prescription was entered and confirmed.  Then, I designed and supervised the construction of a total of 3 medically necessary complex treatment devices.  I have requested : 3D Simulation  I have requested a DVH of the following structures: heart, lungs, lumpectomy cavity.  I have ordered:dose calc.  PLAN:  The patient will receive 42.72 Gy in 16 fractions followed by a boost to the lumpectomy cavity of 12 gray for a cumulative dose of 54.72 gray.  ________________________________  -----------------------------------  Blair Promise, PhD, MD

## 2014-11-15 DIAGNOSIS — C50511 Malignant neoplasm of lower-outer quadrant of right female breast: Secondary | ICD-10-CM | POA: Diagnosis not present

## 2014-11-17 DIAGNOSIS — C50511 Malignant neoplasm of lower-outer quadrant of right female breast: Secondary | ICD-10-CM | POA: Diagnosis not present

## 2014-11-22 ENCOUNTER — Ambulatory Visit
Admission: RE | Admit: 2014-11-22 | Discharge: 2014-11-22 | Disposition: A | Discharge status: Home / Self Care | Payer: BLUE CROSS/BLUE SHIELD | Source: Ambulatory Visit | Attending: Radiation Oncology | Admitting: Radiation Oncology

## 2014-11-22 DIAGNOSIS — C50511 Malignant neoplasm of lower-outer quadrant of right female breast: Secondary | ICD-10-CM

## 2014-11-22 NOTE — Progress Notes (Signed)
  Radiation Oncology         (336) [phone] ________________________________  Name: Crystal [name] MRN: [id number]  Date: 11/22/2014  DOB: Nov 05, 1950  Simulation Verification Note    ICD-9-CM ICD-10-CM   1. Breast cancer of lower-outer quadrant of right female breast 174.5 C50.511     Status: outpatient  NARRATIVE: The patient was brought to the treatment unit and placed in the planned treatment position. The clinical setup was verified. Then port films were obtained and uploaded to the radiation oncology medical record software.  The treatment beams were carefully compared against the planned radiation fields. The position location and shape of the radiation fields was reviewed. They targeted volume of tissue appears to be appropriately covered by the radiation beams. Organs at risk appear to be excluded as planned.  Based on my personal review, I approved the simulation verification. The patient's treatment will proceed as planned.  -----------------------------------  Blair Promise, PhD, MD

## 2014-11-23 ENCOUNTER — Ambulatory Visit
Admission: RE | Admit: 2014-11-23 | Discharge: 2014-11-23 | Disposition: A | Discharge status: Home / Self Care | Payer: BLUE CROSS/BLUE SHIELD | Source: Ambulatory Visit | Attending: Radiation Oncology | Admitting: Radiation Oncology

## 2014-11-23 DIAGNOSIS — C50511 Malignant neoplasm of lower-outer quadrant of right female breast: Secondary | ICD-10-CM | POA: Diagnosis not present

## 2014-11-24 ENCOUNTER — Ambulatory Visit
Admission: RE | Admit: 2014-11-24 | Discharge: 2014-11-24 | Disposition: A | Discharge status: Home / Self Care | Payer: BLUE CROSS/BLUE SHIELD | Source: Ambulatory Visit | Attending: Radiation Oncology | Admitting: Radiation Oncology

## 2014-11-24 DIAGNOSIS — C50511 Malignant neoplasm of lower-outer quadrant of right female breast: Secondary | ICD-10-CM | POA: Diagnosis not present

## 2014-11-28 ENCOUNTER — Ambulatory Visit
Admission: RE | Admit: 2014-11-28 | Discharge: 2014-11-28 | Disposition: A | Discharge status: Home / Self Care | Payer: BLUE CROSS/BLUE SHIELD | Source: Ambulatory Visit | Attending: Radiation Oncology | Admitting: Radiation Oncology

## 2014-11-28 ENCOUNTER — Encounter: Payer: Self-pay | Admitting: Radiation Oncology

## 2014-11-28 VITALS — BP 121/84 | HR 77 | Temp 98.1°F | Resp 16 | Ht 70.0 in | Wt 154.2 lb

## 2014-11-28 DIAGNOSIS — C50511 Malignant neoplasm of lower-outer quadrant of right female breast: Secondary | ICD-10-CM | POA: Diagnosis not present

## 2014-11-28 DIAGNOSIS — Z510 Encounter for antineoplastic radiation therapy: Secondary | ICD-10-CM | POA: Insufficient documentation

## 2014-11-28 MED ORDER — RADIAPLEXRX EX GEL
Freq: Once | CUTANEOUS | Status: AC
  Administered 2014-11-28: 16:00:00 via TOPICAL

## 2014-11-28 MED ORDER — ALRA NON-METALLIC DEODORANT (RAD-ONC)
1.0000 "application " | Freq: Once | TOPICAL | Status: AC
  Administered 2014-11-28: 1 via TOPICAL

## 2014-11-28 NOTE — Progress Notes (Signed)
  Radiation Oncology         [phone]) [phone] ________________________________  Name: Crystal [name] MRN: [id number]  Date: 11/28/2014  DOB: July 13, 1950  Weekly Radiation Therapy Management  DIAGNOSIS: Intraductal carcinoma of the right breast  Current Dose: 8.01 Gy     Planned Dose:  54.72 Gy  Narrative . . . . . . . . The patient presents for routine under treatment assessment.                                   The patient is without complaint. No itching or discomfort within the right breast area                                 Set-up films were reviewed.                                 The chart was checked. Physical Findings. . .  height is 5\' 10"  (1.778 m) and weight is 154 lb 3.2 oz (69.945 kg). Her oral temperature is 98.1 F (36.7 C). Her blood pressure is 121/84 and her pulse is 77. Her respiration is 16. . Weight essentially stable.  No significant changes. The lungs are clear. The heart has a regular rhythm and rate. The right breast area shows no radiation reaction at this point. Impression . . . . . . . The patient is tolerating radiation. Plan . . . . . . . . . . . . Continue treatment as planned.  ________________________________   Blair Promise, PhD, MD

## 2014-11-28 NOTE — Progress Notes (Signed)
Crystal [name] has completed 3 fractions to her right breast.  She denies pain and fatigue.  The skin on her right breast is intact.  Pt here for patient teaching.  Pt given Radiation and You booklet, skin care instructions, Alra deodorant and Radiaplex gel. Reviewed areas of pertinence such as fatigue, skin changes, breast tenderness, breast swelling and taste changes . Pt able to give teach back of to pat skin and use unscented/gentle soap,apply Radiaplex bid, avoid applying anything to skin within 4 hours of treatment, avoid wearing an under wire bra and to use an electric razor if they must shave. Pt demonstrated understanding and verbalizes understanding of information given and will contact nursing with any questions or concerns.    BP 121/84 mmHg  Pulse 77  Temp(Src) 98.1 F (36.7 C) (Oral)  Resp 16  Ht 5\' 10"  (1.778 m)  Wt 154 lb 3.2 oz (69.945 kg)  BMI 22.13 kg/m2

## 2014-11-29 ENCOUNTER — Ambulatory Visit
Admission: RE | Admit: 2014-11-29 | Discharge: 2014-11-29 | Disposition: A | Discharge status: Home / Self Care | Payer: BLUE CROSS/BLUE SHIELD | Source: Ambulatory Visit | Attending: Radiation Oncology | Admitting: Radiation Oncology

## 2014-11-29 DIAGNOSIS — C50511 Malignant neoplasm of lower-outer quadrant of right female breast: Secondary | ICD-10-CM | POA: Diagnosis not present

## 2014-11-30 ENCOUNTER — Ambulatory Visit
Admission: RE | Admit: 2014-11-30 | Discharge: 2014-11-30 | Disposition: A | Discharge status: Home / Self Care | Payer: BLUE CROSS/BLUE SHIELD | Source: Ambulatory Visit | Attending: Radiation Oncology | Admitting: Radiation Oncology

## 2014-11-30 DIAGNOSIS — C50511 Malignant neoplasm of lower-outer quadrant of right female breast: Secondary | ICD-10-CM | POA: Diagnosis not present

## 2014-12-01 ENCOUNTER — Ambulatory Visit
Admission: RE | Admit: 2014-12-01 | Discharge: 2014-12-01 | Disposition: A | Discharge status: Home / Self Care | Payer: BLUE CROSS/BLUE SHIELD | Source: Ambulatory Visit | Attending: Radiation Oncology | Admitting: Radiation Oncology

## 2014-12-01 DIAGNOSIS — C50511 Malignant neoplasm of lower-outer quadrant of right female breast: Secondary | ICD-10-CM | POA: Diagnosis not present

## 2014-12-04 ENCOUNTER — Ambulatory Visit
Admission: RE | Admit: 2014-12-04 | Discharge: 2014-12-04 | Disposition: A | Discharge status: Home / Self Care | Payer: BLUE CROSS/BLUE SHIELD | Source: Ambulatory Visit | Attending: Radiation Oncology | Admitting: Radiation Oncology

## 2014-12-04 DIAGNOSIS — C50511 Malignant neoplasm of lower-outer quadrant of right female breast: Secondary | ICD-10-CM | POA: Diagnosis not present

## 2014-12-05 ENCOUNTER — Ambulatory Visit
Admission: RE | Admit: 2014-12-05 | Discharge: 2014-12-05 | Disposition: A | Discharge status: Home / Self Care | Payer: BLUE CROSS/BLUE SHIELD | Source: Ambulatory Visit | Attending: Radiation Oncology | Admitting: Radiation Oncology

## 2014-12-05 ENCOUNTER — Encounter: Payer: Self-pay | Admitting: Radiation Oncology

## 2014-12-05 VITALS — BP 118/71 | HR 65 | Temp 98.7°F | Resp 12 | Ht 70.0 in | Wt 155.2 lb

## 2014-12-05 DIAGNOSIS — C50511 Malignant neoplasm of lower-outer quadrant of right female breast: Secondary | ICD-10-CM | POA: Diagnosis not present

## 2014-12-05 NOTE — Progress Notes (Signed)
Crystal [name] has completed 8 fractions to her right breast.  She denies pain and fatigue.  The skin underneath her right breast has slight hyperpigmentation.  She is using radiaplex.  BP 118/71 mmHg  Pulse 65  Temp(Src) 98.7 F (37.1 C) (Oral)  Resp 12  Ht 5\' 10"  (1.778 m)  Wt 155 lb 3.2 oz (70.398 kg)  BMI 22.27 kg/m2

## 2014-12-05 NOTE — Progress Notes (Signed)
  Radiation Oncology         [phone]) [phone] ________________________________  Name: BRITANNY MARKSBERRY MRN: [id number]  Date: 12/05/2014  DOB: 07-15-1950  Weekly Radiation Therapy Management  DIAGNOSIS: Intraductal carcinoma of the right breast  Current Dose: 21.36 Gy     Planned Dose:  54.72 Gy  Narrative . . . . . . . . The patient presents for routine under treatment assessment.                                   The patient is without complaint.   She denies pain and fatigue. The skin underneath her right breast has slight hyperpigmentation. She is using radiaplex.                                 Set-up films were reviewed.                                 The chart was checked. Physical Findings. . .  height is 5\' 10"  (1.778 m) and weight is 155 lb 3.2 oz (70.398 kg). Her oral temperature is 98.7 F (37.1 C). Her blood pressure is 118/71 and her pulse is 65. Her respiration is 12. . Weight essentially stable.  No significant changes.  The lungs are clear. The heart has a regular rhythm and rate. No appreciable radiation reaction along the right breast Impression . . . . . . . The patient is tolerating radiation. Plan . . . . . . . . . . . . Continue treatment as planned.  ________________________________   Blair Promise, PhD, MD

## 2014-12-06 ENCOUNTER — Ambulatory Visit
Admission: RE | Admit: 2014-12-06 | Discharge: 2014-12-06 | Disposition: A | Discharge status: Home / Self Care | Payer: BLUE CROSS/BLUE SHIELD | Source: Ambulatory Visit | Attending: Radiation Oncology | Admitting: Radiation Oncology

## 2014-12-06 DIAGNOSIS — C50511 Malignant neoplasm of lower-outer quadrant of right female breast: Secondary | ICD-10-CM | POA: Diagnosis not present

## 2014-12-07 ENCOUNTER — Ambulatory Visit
Admission: RE | Admit: 2014-12-07 | Discharge: 2014-12-07 | Disposition: A | Discharge status: Home / Self Care | Payer: BLUE CROSS/BLUE SHIELD | Source: Ambulatory Visit | Attending: Radiation Oncology | Admitting: Radiation Oncology

## 2014-12-07 DIAGNOSIS — C50511 Malignant neoplasm of lower-outer quadrant of right female breast: Secondary | ICD-10-CM | POA: Diagnosis not present

## 2014-12-08 ENCOUNTER — Ambulatory Visit
Admission: RE | Admit: 2014-12-08 | Discharge: 2014-12-08 | Disposition: A | Discharge status: Home / Self Care | Payer: BLUE CROSS/BLUE SHIELD | Source: Ambulatory Visit | Attending: Radiation Oncology | Admitting: Radiation Oncology

## 2014-12-08 DIAGNOSIS — C50511 Malignant neoplasm of lower-outer quadrant of right female breast: Secondary | ICD-10-CM | POA: Diagnosis not present

## 2014-12-11 ENCOUNTER — Ambulatory Visit
Admission: RE | Admit: 2014-12-11 | Discharge: 2014-12-11 | Disposition: A | Discharge status: Home / Self Care | Payer: BLUE CROSS/BLUE SHIELD | Source: Ambulatory Visit | Attending: Radiation Oncology | Admitting: Radiation Oncology

## 2014-12-11 DIAGNOSIS — C50511 Malignant neoplasm of lower-outer quadrant of right female breast: Secondary | ICD-10-CM | POA: Diagnosis not present

## 2014-12-12 ENCOUNTER — Ambulatory Visit
Admission: RE | Admit: 2014-12-12 | Discharge: 2014-12-12 | Disposition: A | Discharge status: Home / Self Care | Payer: BLUE CROSS/BLUE SHIELD | Source: Ambulatory Visit | Attending: Radiation Oncology | Admitting: Radiation Oncology

## 2014-12-12 ENCOUNTER — Encounter: Payer: Self-pay | Admitting: Radiation Oncology

## 2014-12-12 VITALS — BP 110/77 | HR 70 | Temp 98.3°F | Resp 16 | Ht 70.0 in | Wt 155.2 lb

## 2014-12-12 DIAGNOSIS — C50511 Malignant neoplasm of lower-outer quadrant of right female breast: Secondary | ICD-10-CM | POA: Diagnosis present

## 2014-12-12 MED ORDER — RADIAPLEXRX EX GEL
Freq: Once | CUTANEOUS | Status: AC
  Administered 2014-12-12: 10:00:00 via TOPICAL

## 2014-12-12 NOTE — Progress Notes (Signed)
  Radiation Oncology         [phone]) [phone] ________________________________  Name: Crystal [name] MRN: [id number]  Date: 12/12/2014  DOB: 1950/11/17  Simulation Note  DIAGNOSIS: Intraductal carcinoma of the right breast  Status: outpatient  NARRATIVE: Today the patient underwent additional planning for radiation therapy directed at the right breast area. the patient's treatment planning CT scan was reviewed and she had set up of a boost field directed at the lumpectomy cavity within the right breast. Patient will be treated with 12 MEV electrons using an electron [employer] isodose plan. Prescription is to the 100% isodose line. The patient will receive 6 additional treatments at 2 gray per fraction for a boost dose of 12 gray and a cumulative dose of 54.720 gray. A special port plan is requested for treatment  -----------------------------------  Blair Promise, PhD, MD

## 2014-12-12 NOTE — Progress Notes (Signed)
Crystal [name] has completed 13 fractions to her right breast.  She reports having some soreness in her right breast that started a couple days ago.  She denies fatigue.  The skin on her right breast is pink.  She is using radiaplex and needs a refill. Another tube has been given.  BP 110/77 mmHg  Pulse 70  Temp(Src) 98.3 F (36.8 C) (Oral)  Resp 16  Ht 5\' 10"  (1.778 m)  Wt 155 lb 3.2 oz (70.398 kg)  BMI 22.27 kg/m2

## 2014-12-12 NOTE — Progress Notes (Signed)
  Radiation Oncology         [phone]) [phone] ________________________________  Name: Crystal [name] MRN: [id number]  Date: 12/12/2014  DOB: Jun 19, 1951  Weekly Radiation Therapy Management  DIAGNOSIS: Intraductal carcinoma of the right breast  Current Dose: 34.71 Gy     Planned Dose:  54.72 Gy  Narrative . . . . . . . . The patient presents for routine under treatment assessment.                                   The patient is without complaint. Occasional soreness in the nipple area.                                 Set-up films were reviewed.                                 The chart was checked. Physical Findings. . .  height is 5\' 10"  (1.778 m) and weight is 155 lb 3.2 oz (70.398 kg). Her oral temperature is 98.3 F (36.8 C). Her blood pressure is 110/77 and her pulse is 70. Her respiration is 16. . Weight essentially stable.  The lungs are clear. The heart has a regular rhythm and rate.  the right breast area shows mild erythema. Impression . . . . . . . The patient is tolerating radiation. Plan . . . . . . . . . . . . Continue treatment as planned.  ________________________________   Blair Promise, PhD, MD

## 2014-12-13 ENCOUNTER — Ambulatory Visit
Admission: RE | Admit: 2014-12-13 | Discharge: 2014-12-13 | Disposition: A | Discharge status: Home / Self Care | Payer: BLUE CROSS/BLUE SHIELD | Source: Ambulatory Visit | Attending: Radiation Oncology | Admitting: Radiation Oncology

## 2014-12-13 DIAGNOSIS — C50511 Malignant neoplasm of lower-outer quadrant of right female breast: Secondary | ICD-10-CM | POA: Diagnosis not present

## 2014-12-14 ENCOUNTER — Ambulatory Visit
Admission: RE | Admit: 2014-12-14 | Discharge: 2014-12-14 | Disposition: A | Discharge status: Home / Self Care | Payer: BLUE CROSS/BLUE SHIELD | Source: Ambulatory Visit | Attending: Radiation Oncology | Admitting: Radiation Oncology

## 2014-12-14 DIAGNOSIS — C50511 Malignant neoplasm of lower-outer quadrant of right female breast: Secondary | ICD-10-CM | POA: Diagnosis not present

## 2014-12-15 ENCOUNTER — Ambulatory Visit
Admission: RE | Admit: 2014-12-15 | Discharge: 2014-12-15 | Disposition: A | Discharge status: Home / Self Care | Payer: BLUE CROSS/BLUE SHIELD | Source: Ambulatory Visit | Attending: Radiation Oncology | Admitting: Radiation Oncology

## 2014-12-15 DIAGNOSIS — C50511 Malignant neoplasm of lower-outer quadrant of right female breast: Secondary | ICD-10-CM | POA: Diagnosis not present

## 2014-12-17 ENCOUNTER — Ambulatory Visit: Payer: BLUE CROSS/BLUE SHIELD

## 2014-12-18 ENCOUNTER — Ambulatory Visit
Admission: RE | Admit: 2014-12-18 | Discharge: 2014-12-18 | Disposition: A | Discharge status: Home / Self Care | Payer: BLUE CROSS/BLUE SHIELD | Source: Ambulatory Visit | Attending: Radiation Oncology | Admitting: Radiation Oncology

## 2014-12-18 ENCOUNTER — Ambulatory Visit: Payer: BLUE CROSS/BLUE SHIELD

## 2014-12-18 DIAGNOSIS — C50511 Malignant neoplasm of lower-outer quadrant of right female breast: Secondary | ICD-10-CM | POA: Diagnosis not present

## 2014-12-19 ENCOUNTER — Encounter: Payer: Self-pay | Admitting: Radiation Oncology

## 2014-12-19 ENCOUNTER — Ambulatory Visit
Admission: RE | Admit: 2014-12-19 | Discharge: 2014-12-19 | Disposition: A | Discharge status: Home / Self Care | Payer: BLUE CROSS/BLUE SHIELD | Source: Ambulatory Visit | Attending: Radiation Oncology | Admitting: Radiation Oncology

## 2014-12-19 ENCOUNTER — Ambulatory Visit: Payer: BLUE CROSS/BLUE SHIELD

## 2014-12-19 VITALS — BP 108/81 | HR 80 | Temp 98.6°F | Resp 20 | Wt 153.8 lb

## 2014-12-19 DIAGNOSIS — C50511 Malignant neoplasm of lower-outer quadrant of right female breast: Secondary | ICD-10-CM

## 2014-12-19 NOTE — Progress Notes (Addendum)
Weekly rad txs  18/22 completed, slight dermatitis on chest ,nipple tender at times, no c/o pain, radiaplex bid, no c/o pain  9:55 AM BP 108/81 mmHg  Pulse 80  Temp(Src) 98.6 F (37 C) (Oral)  Resp 20  Wt 153 lb 12.8 oz (69.763 kg)  Wt Readings from Last 3 Encounters:  12/19/14 153 lb 12.8 oz (69.763 kg)  12/12/14 155 lb 3.2 oz (70.398 kg)  12/05/14 155 lb 3.2 oz (70.398 kg)

## 2014-12-19 NOTE — Progress Notes (Signed)
  Radiation Oncology         (336) [phone] ________________________________  Name: Crystal [name] MRN: [id number]  Date: 12/19/2014  DOB: 01/30/1951  Weekly Radiation Therapy Management  DIAGNOSIS: Intraductal carcinoma of the right breast  Current Dose: 46.72 Gy     Planned Dose:  54.72 Gy  Narrative . . . . . . . . The patient presents for routine under treatment assessment.                                   The patient is without complaint. No significant itching or discomfort within the breast. She continues to work her usual schedule.                                 Set-up films were reviewed.                                 The chart was checked. Physical Findings. . .  weight is 153 lb 12.8 oz (69.763 kg). Her oral temperature is 98.6 F (37 C). Her blood pressure is 108/81 and her pulse is 80. Her respiration is 20. . Mild radiation dermatitis in the breast area. No skin breakdown. Impression . . . . . . . The patient is tolerating radiation. Plan . . . . . . . . . . . . Continue treatment as planned.  ________________________________   Blair Promise, PhD, MD

## 2014-12-20 ENCOUNTER — Ambulatory Visit: Payer: BLUE CROSS/BLUE SHIELD

## 2014-12-20 ENCOUNTER — Ambulatory Visit
Admission: RE | Admit: 2014-12-20 | Discharge: 2014-12-20 | Disposition: A | Discharge status: Home / Self Care | Payer: BLUE CROSS/BLUE SHIELD | Source: Ambulatory Visit | Attending: Radiation Oncology | Admitting: Radiation Oncology

## 2014-12-20 DIAGNOSIS — C50511 Malignant neoplasm of lower-outer quadrant of right female breast: Secondary | ICD-10-CM | POA: Diagnosis not present

## 2014-12-21 ENCOUNTER — Ambulatory Visit
Admission: RE | Admit: 2014-12-21 | Discharge: 2014-12-21 | Disposition: A | Discharge status: Home / Self Care | Payer: BLUE CROSS/BLUE SHIELD | Source: Ambulatory Visit | Attending: Radiation Oncology | Admitting: Radiation Oncology

## 2014-12-21 ENCOUNTER — Ambulatory Visit: Payer: BLUE CROSS/BLUE SHIELD

## 2014-12-21 DIAGNOSIS — C50511 Malignant neoplasm of lower-outer quadrant of right female breast: Secondary | ICD-10-CM | POA: Diagnosis not present

## 2014-12-22 ENCOUNTER — Ambulatory Visit: Payer: BLUE CROSS/BLUE SHIELD

## 2014-12-22 ENCOUNTER — Ambulatory Visit
Admission: RE | Admit: 2014-12-22 | Discharge: 2014-12-22 | Disposition: A | Discharge status: Home / Self Care | Payer: BLUE CROSS/BLUE SHIELD | Source: Ambulatory Visit | Attending: Radiation Oncology | Admitting: Radiation Oncology

## 2014-12-22 DIAGNOSIS — C50511 Malignant neoplasm of lower-outer quadrant of right female breast: Secondary | ICD-10-CM | POA: Diagnosis not present

## 2014-12-25 ENCOUNTER — Encounter: Payer: Self-pay | Admitting: Radiation Oncology

## 2014-12-25 ENCOUNTER — Ambulatory Visit
Admission: RE | Admit: 2014-12-25 | Discharge: 2014-12-25 | Disposition: A | Discharge status: Home / Self Care | Payer: BLUE CROSS/BLUE SHIELD | Source: Ambulatory Visit | Attending: Radiation Oncology | Admitting: Radiation Oncology

## 2014-12-25 VITALS — BP 115/69 | HR 68 | Temp 98.7°F | Resp 20 | Wt 154.7 lb

## 2014-12-25 DIAGNOSIS — C50511 Malignant neoplasm of lower-outer quadrant of right female breast: Secondary | ICD-10-CM

## 2014-12-25 NOTE — Progress Notes (Signed)
Weekly Management Note:  Site: Right breast  Current  Dose:   5472 cGy Projected Dose: [id number]  Narrative: The patient is seen today for routine under treatment assessment. CBCT/MVCT images/port films were reviewed. The chart was reviewedshe is seen today on completion of radiation therapy.  She uses Radioplex gel.  She has occasional mild pruritus. Physical Examination:  Filed Vitals:   12/25/14 0931  BP: 115/69  Pulse: 68  Temp: 98.7 F (37.1 C)  Resp: 20  .  Weight: 154 lb 11.2 oz (70.171 kg):  There is mild erythema along the right breast with no areas of desquamation.  Impression: Radiation therapy completed and well tolerated.    Plan: Follow-up visit with Dr. Sondra Come in one month.

## 2014-12-25 NOTE — Progress Notes (Addendum)
Weekly rad txs 22/22 completed, slight dermatitis on chest,nipple area tender at times, radiaplex bid, 1 month f/u appt card given no c/o pain Hasn't started tamoxifen as yet will call Dr. Worthy Flank nurse to see when to start Appetite good energy level lgood 9:29 AM BP 115/69 mmHg  Pulse 68  Temp(Src) 98.7 F (37.1 C) (Oral)  Resp 20  Wt 154 lb 11.2 oz (70.171 kg)  Wt Readings from Last 3 Encounters:  12/25/14 154 lb 11.2 oz (70.171 kg)  12/19/14 153 lb 12.8 oz (69.763 kg)  12/12/14 155 lb 3.2 oz (70.398 kg)

## 2014-12-26 ENCOUNTER — Ambulatory Visit: Payer: BLUE CROSS/BLUE SHIELD

## 2014-12-27 ENCOUNTER — Ambulatory Visit: Payer: BLUE CROSS/BLUE SHIELD

## 2014-12-28 ENCOUNTER — Ambulatory Visit: Payer: BLUE CROSS/BLUE SHIELD

## 2014-12-29 ENCOUNTER — Telehealth: Payer: Self-pay | Admitting: *Deleted

## 2014-12-29 ENCOUNTER — Other Ambulatory Visit: Payer: Self-pay | Admitting: Hematology and Oncology

## 2014-12-29 DIAGNOSIS — C50511 Malignant neoplasm of lower-outer quadrant of right female breast: Secondary | ICD-10-CM

## 2014-12-29 NOTE — Telephone Encounter (Signed)
It appears she already has Tamoxifen prescription sent to [facility] at end of April. Have her start taking it

## 2014-12-29 NOTE — Telephone Encounter (Signed)
Patient called inquiring about her Tamoxifen prescription. She is currently finished with radiation and would like to know when should she start taking her Tamoxifen. Message sent to RN Terri F/MD Lindi Adie.

## 2014-12-31 NOTE — Progress Notes (Signed)
  Radiation Oncology         (336) [phone] ________________________________  Name: EGAN SAHLIN MRN: [id number]  Date: 12/25/2014  DOB: [birth year]  End of Treatment Note  ICD-9-CM ICD-10-CM     1. Breast cancer of lower-outer quadrant of right female breast 174.5 C50.511     DIAGNOSIS: Intraductal carcinoma of the right breast     Indication for treatment:  Breast conservation therapy       Radiation treatment dates:   11/23/2014-12/25/2014  Site/dose:   Right breast 42.72 gray in 16 fractions; lumpectomy cavity boost 12 gray,  cumulative dose to the lumpectomy cavity of 54.72 gray  Beams/energy:   3-D conformal using tangential beams for whole breast treatment; electron boost custom electron cutout using an electron [employer] isodose plan, 12 MeV  electrons  Narrative: The patient tolerated radiation treatment relatively well.   minimal itching within the breast,  mild fatigue. The patient continued to work her usual schedule.  Plan: The patient has completed radiation treatment. The patient will return to radiation oncology clinic for routine followup in one month. I advised them to call or return sooner if they have any questions or concerns related to their recovery or treatment.  -----------------------------------  Blair Promise, PhD, MD

## 2015-01-05 ENCOUNTER — Telehealth: Payer: Self-pay | Admitting: *Deleted

## 2015-01-05 NOTE — Telephone Encounter (Signed)
Spoke to pt concerning needs after xrt. Relate she is doing well and without complaints. Encourage pt to call with needs or questions. Received verbal understanding.

## 2015-01-08 ENCOUNTER — Telehealth: Payer: Self-pay | Admitting: Adult Health

## 2015-01-08 NOTE — Telephone Encounter (Signed)
I spoke with [name] briefly about the purpose of survivorship and the role we play in her care as a cancer survivor. I offered to have her see [name] in Survivorship Clinic on the same day that she sees Dr. Sondra Come for her routine f/u on 01/25/15.  However, at this time, she would prefer to have her survivorship care plan (SCP) mailed to her home for her review in lieu of an in-person visit.    She did tell me that she began the Tamoxifen on 12/30/14 and has been tolerating well thus far.  She also had questions regarding her risk reduction strategies for recurrence of breast cancer in the future, most specifically if she needed to have hysterectomy/BSO and/or genetic testing.  I let her know that there are several criteria that need to be met to qualify for genetic testing, including a significant family history of malignancy.  Her mother had uterine cancer at the age of 84 and also had Hodgkin Lymphoma.  I encouraged [name] to talk to Dr. Lindi Adie about her options at her next follow-up with him, but it was likely that she would not qualify for genetic testing based on one 1st degree relative with a history of cancer.  However, I will defer to Dr. Geralyn Flash judgement for the patient's final recommendations.   I will mail a copy of her SCP per her wishes. She was encouraged to call me with any questions or concerns and I would be happy to see her at any time, if she would like.  She expressed verbal understanding and agrees with this plan.   Mike Craze, NP [facility] [phone]

## 2015-01-24 ENCOUNTER — Encounter: Payer: Self-pay | Admitting: Oncology

## 2015-01-25 ENCOUNTER — Ambulatory Visit
Admission: RE | Admit: 2015-01-25 | Discharge: 2015-01-25 | Disposition: A | Discharge status: Home / Self Care | Payer: BLUE CROSS/BLUE SHIELD | Source: Ambulatory Visit | Attending: Radiation Oncology | Admitting: Radiation Oncology

## 2015-01-25 ENCOUNTER — Encounter: Payer: Self-pay | Admitting: Radiation Oncology

## 2015-01-25 VITALS — BP 119/83 | HR 81 | Temp 98.7°F | Resp 16 | Ht 70.0 in | Wt 155.5 lb

## 2015-01-25 DIAGNOSIS — C50511 Malignant neoplasm of lower-outer quadrant of right female breast: Secondary | ICD-10-CM

## 2015-01-25 NOTE — Progress Notes (Signed)
  Radiation Oncology         [phone] ________________________________  Name: Crystal [name] MRN: [id number]  Date: 01/25/2015  DOB: 06-11-1951  Follow-Up Visit Note  CC: [name], MD  Coralie Keens, MD     ICD-9-CM ICD-10-CM   1. Breast cancer of lower-outer quadrant of right female breast 174.5 C50.511    Diagnosis: Intraductal carcinoma of the right breast  Interval Since Last Radiation:  4  weeks (11/23/2014-12/25/2014)  Site/dose:   Right breast 42.72 gray in 16 fractions; lumpectomy cavity boost 12 gray,  cumulative dose to the lumpectomy cavity of 54.72 gray  Narrative:  The patient returns today for routine follow-up. She denies pain and fatigue. She started taking tamoxifen on 12/29/14. Pt reports using radiaplex once a day. The pt denies pruritus.  ALLERGIES:  has No Known Allergies.  Meds: Current Outpatient Prescriptions  Medication Sig Dispense Refill  . Calcium Carbonate-Vitamin D (CALCIUM 600-D) 600-400 MG-UNIT per tablet Take 2 tablets by mouth daily.      . Cholecalciferol (VITAMIN D3) 1000 UNITS CAPS Take by mouth daily.      . fluticasone (FLONASE) 50 MCG/ACT nasal spray Place into both nostrils daily.    . hyaluronate sodium (RADIAPLEXRX) GEL Apply 1 application topically once.    . Inulin (FIBER CHOICE FRUITY BITES PO) Take by mouth.    . Multiple Vitamin (MULTIVITAMIN) tablet Take 1 tablet by mouth daily.      . tamoxifen (NOLVADEX) 20 MG tablet Take 1 tablet (20 mg total) by mouth daily. 90 tablet 3  . non-metallic deodorant (ALRA) MISC Apply 1 application topically daily as needed.     No current facility-administered medications for this encounter.    Physical Findings: The patient is in no acute distress. Patient is alert and oriented.  height is 5\' 10"  (1.778 m) and weight is 155 lb 8 oz (70.534 kg). Her oral temperature is 98.7 F (37.1 C). Her blood pressure is 119/83 and her pulse is 81. Her respiration is 16. .  No significant  changes.  General: Alert and oriented, in no acute distress    Neck: Neck is supple, no palpable cervical or supraclavicular lymphadenopathy. Heart: Regular in rate and rhythm with no murmurs, rubs, or gallops. Chest: Clear to auscultation bilaterally, with no rhonchi, wheezes, or rales.     Skin: No concerning lesions. The right breast has healed well with mild hyperpigmentation.   Impression:  The patient is recovering from the effects of radiation.  Plan: I advised the pt that she could continue using the radiaplex prn. She is to f/u with me in 6 months.  She will continue on tamoxifen.  This document serves as a record of services personally performed by Gery Pray, MD. It was created on his behalf by Darcus Austin, a trained medical scribe. The creation of this record is based on the scribe's personal observations and the provider's statements to them. This document has been checked and approved by the attending provider.  ____________________________________  Blair Promise, PhD, MD

## 2015-01-25 NOTE — Progress Notes (Signed)
Crystal [name] here for follow up after treatment to her right breast.  She denies pain and fatigue.  She started taking tamoxifen on 12/29/14.  The skin on her right breast has slight hyperpigmentation.  She is using radiaplex once a day.  BP 119/83 mmHg  Pulse 81  Temp(Src) 98.7 F (37.1 C) (Oral)  Resp 16  Ht 5\' 10"  (1.778 m)  Wt 155 lb 8 oz (70.534 kg)  BMI 22.31 kg/m2

## 2015-02-06 ENCOUNTER — Encounter: Payer: Self-pay | Admitting: Internal Medicine

## 2015-02-06 ENCOUNTER — Other Ambulatory Visit: Payer: BLUE CROSS/BLUE SHIELD

## 2015-02-06 ENCOUNTER — Telehealth: Payer: Self-pay | Admitting: Hematology and Oncology

## 2015-02-06 ENCOUNTER — Ambulatory Visit (HOSPITAL_BASED_OUTPATIENT_CLINIC_OR_DEPARTMENT_OTHER): Payer: BLUE CROSS/BLUE SHIELD | Admitting: Hematology and Oncology

## 2015-02-06 ENCOUNTER — Telehealth: Payer: Self-pay | Admitting: *Deleted

## 2015-02-06 ENCOUNTER — Ambulatory Visit (INDEPENDENT_AMBULATORY_CARE_PROVIDER_SITE_OTHER): Payer: BLUE CROSS/BLUE SHIELD | Admitting: Internal Medicine

## 2015-02-06 ENCOUNTER — Encounter: Payer: Self-pay | Admitting: Hematology and Oncology

## 2015-02-06 VITALS — BP 108/71 | HR 68 | Temp 98.2°F | Resp 18 | Ht 70.0 in | Wt 155.9 lb

## 2015-02-06 VITALS — BP 126/86 | HR 71 | Temp 98.2°F | Resp 16 | Wt 157.0 lb

## 2015-02-06 DIAGNOSIS — C50511 Malignant neoplasm of lower-outer quadrant of right female breast: Secondary | ICD-10-CM

## 2015-02-06 DIAGNOSIS — N951 Menopausal and female climacteric states: Secondary | ICD-10-CM

## 2015-02-06 DIAGNOSIS — M533 Sacrococcygeal disorders, not elsewhere classified: Secondary | ICD-10-CM | POA: Diagnosis not present

## 2015-02-06 DIAGNOSIS — R300 Dysuria: Secondary | ICD-10-CM | POA: Diagnosis not present

## 2015-02-06 DIAGNOSIS — R350 Frequency of micturition: Secondary | ICD-10-CM

## 2015-02-06 LAB — POCT URINALYSIS DIPSTICK
Bilirubin, UA: NEGATIVE
Blood, UA: 10
GLUCOSE UA: NEGATIVE
Ketones, UA: NEGATIVE
Leukocytes, UA: NEGATIVE
Nitrite, UA: NEGATIVE
PROTEIN UA: NEGATIVE
SPEC GRAV UA: 1.01
Urobilinogen, UA: 0.2
pH, UA: 6

## 2015-02-06 MED ORDER — NITROFURANTOIN MONOHYD MACRO 100 MG PO CAPS: 100.0000 mg | ORAL_CAPSULE | Freq: Two times a day (BID) | ORAL | Status: DC

## 2015-02-06 MED ORDER — PHENAZOPYRIDINE HCL 200 MG PO TABS: 200.0000 mg | ORAL_TABLET | Freq: Three times a day (TID) | ORAL | Status: DC | PRN

## 2015-02-06 NOTE — Progress Notes (Signed)
   Subjective:    Patient ID: Crystal [name], female    DOB: [birth year], 64 y.o.   MRN: [id number]  HPI Her symptoms began approximately a week ago as pressure in the suprapubic area with frequency and dysuria. There was no specific trigger; but she does not take in significant oral fluids.  On April 18 she was diagnosed with urinary tract infection by her gynecologist and placed on amoxicillin. She had similar symptoms at that time.  She last had a urinary tract infection 30 years ago. She has no history of genitourinary anomaly. She is not a diabetic.   Review of Systems She doesn't have hematuria or pyuria. She denies fever, chills, or sweats. She has no flank pain.  She does have discomfort in the coccyx area after sitting for prolonged period time on a hard chair. She has a history of osteopenia. Bone density is due. This is performed @ her gynecologist's office.    Objective:   Physical Exam  Pertinent or positive findings include: She appears much younger than stated age. There is accentuated curvature of the upper thoracic spine.  General appearance :adequately nourished; in no distress.  Eyes: No conjunctival inflammation or scleral icterus is present.  Oral exam:  Lips and gums are healthy appearing.There is no oropharyngeal erythema or exudate noted. Dental hygiene is good.  Heart:  Normal rate and regular rhythm. S1 and S2 normal without gallop, murmur, click, rub or other extra sounds    Lungs:Chest clear to auscultation; no wheezes, rhonchi,rales ,or rubs present.No increased work of breathing.   Abdomen: bowel sounds normal, soft and non-tender without masses, organomegaly or hernias noted.  No guarding or rebound. No flank tenderness to percussion.  Vascular : all pulses equal ; no bruits present.  Skin:Warm & dry.  Intact without suspicious lesions or rashes ; no tenting or jaundice   Lymphatic: No lymphadenopathy is noted about the head, neck, axilla    Neuro: Strength, tone normal.         Assessment & Plan:  #1 dysuria, probable urinary tract infection  Plan: As per standard of care she'll be placed on nitrofurantoin pending results of the culture and sensitivity.

## 2015-02-06 NOTE — Patient Instructions (Signed)
Drink as much nondairy fluids as possible. Avoid spicy foods or alcohol as  these may aggravate the bladder. Do not take decongestants. Avoid narcotics if possible.  Use a rubber doughnut when sitting for prolonged period time to prevent discomfort in the coccyx. Soaking  in hot tub will also provide relief.

## 2015-02-06 NOTE — Telephone Encounter (Signed)
Gave avs & calendar for February. °

## 2015-02-06 NOTE — Addendum Note (Signed)
Addended by: Prentiss Bells on: 02/06/2015 04:17 PM   Modules accepted: Medications

## 2015-02-06 NOTE — Progress Notes (Signed)
Pre visit review using our clinic review tool, if applicable. No additional management support is needed unless otherwise documented below in the visit note. 

## 2015-02-06 NOTE — Telephone Encounter (Signed)
Pt states pharmacy only received one prescription. Dr. Linna Darner was suppose to send antibiotic in too. Inform pt md miss and hit phone-in. Will resend to pharmacy electronically...Crystal [name]

## 2015-02-06 NOTE — Progress Notes (Signed)
Patient Care Team: [name], MD as PCP - General (Internal Medicine)  DIAGNOSIS: Breast cancer of lower-outer quadrant of right female breast   Staging form: Breast, AJCC 7th Edition     Clinical: Stage 0 (Tis (DCIS), N0, M0) - Unsigned   SUMMARY OF ONCOLOGIC HISTORY:   Breast cancer of lower-outer quadrant of right female breast   09/04/2014 Mammogram Right breast with 1 cm new cluster of calcifications at 7:00, 5 cm from nipple; 0.5 cm oval aysmmetry in left medial breast, 3.5 from nipple.     09/08/2014 Breast US Left breast: possible 5 mm oval cyst, hypoechoic, correlates with mammographic findings. no axillary findings.   09/20/2014 Initial Biopsy Right breast needle core bx: DCIS with associated calcifications, ER+ (97%), PR+ (67%)   10/18/2014 Surgery Right breast lumpectomy Ninfa Linden): DCIS, grade 1, calcifications present. ER+ (97%), PR+ (67%). 2.6 cm in size. 0.83mm from nearest margin (posterior). [facility] nomogram risk of recurrence 2% at 10 years with antiestrogen therapy and radiation   10/18/2014 Clinical Stage Stage 0: Tis N0 M0.   11/23/2014 - 12/25/2014 Radiation Therapy Adjuvant RT completed ([facility]): right breast 42.72 Gy over 16 fractions; boost to lumpectomy cavity 12 Gy; total dose received: 54.72 Gy.     12/30/2014 -  Anti-estrogen oral therapy Tamoxifen 20 mg daily ([place]).  Planned duration of therapy: 5 years.     CHIEF COMPLIANT: Follow-up on tamoxifen therapy  INTERVAL HISTORY: Crystal [name] is a 64 year old with above-mentioned history of right breast DCIS treated with lumpectomy and adjuvant radiation she is currently on tamoxifen and tolerating it fairly well. She does have occasional hot flashes and muscle cramps. Denies any muscle stiffness or aches. Tolerating tamoxifen fairly well.  REVIEW OF SYSTEMS:   Constitutional: Denies fevers, chills or abnormal weight loss Eyes: Denies blurriness of vision Ears, nose, mouth, throat, and face: Denies mucositis  or sore throat Respiratory: Denies cough, dyspnea or wheezes Cardiovascular: Denies palpitation, chest discomfort or lower extremity swelling Gastrointestinal:  Denies nausea, heartburn or change in bowel habits Skin: Denies abnormal skin rashes Lymphatics: Denies new lymphadenopathy or easy bruising Neurological:Denies numbness, tingling or new weaknesses Behavioral/Psych: Mood is stable, no new changes  Breast:  denies any pain or lumps or nodules in either breasts All other systems were reviewed with the patient and are negative.  I have reviewed the past medical history, past surgical history, social history and family history with the patient and they are unchanged from previous note.  ALLERGIES:  has No Known Allergies.  MEDICATIONS:  Current Outpatient Prescriptions  Medication Sig Dispense Refill  . Calcium Carbonate-Vitamin D (CALCIUM 600-D) 600-400 MG-UNIT per tablet Take 2 tablets by mouth daily.      . Cholecalciferol (VITAMIN D3) 1000 UNITS CAPS Take by mouth daily.      . hyaluronate sodium (RADIAPLEXRX) GEL Apply 1 application topically once.    . Inulin (FIBER CHOICE FRUITY BITES PO) Take by mouth.    . Multiple Vitamin (MULTIVITAMIN) tablet Take 1 tablet by mouth daily.      . nitrofurantoin, macrocrystal-monohydrate, (MACROBID) 100 MG capsule Take 1 capsule (100 mg total) by mouth 2 (two) times daily. 14 capsule 0  . phenazopyridine (PYRIDIUM) 200 MG tablet Take 1 tablet (200 mg total) by mouth 3 (three) times daily as needed for pain. 10 tablet 0  . tamoxifen (NOLVADEX) 20 MG tablet Take 1 tablet (20 mg total) by mouth daily. 90 tablet 3   No current facility-administered medications for this visit.  PHYSICAL EXAMINATION: ECOG PERFORMANCE STATUS: 0 - Asymptomatic  Filed Vitals:   02/06/15 1522  BP: 108/71  Pulse: 68  Temp: 98.2 F (36.8 C)  Resp: 18   Filed Weights   02/06/15 1522  Weight: 155 lb 14.4 oz (70.716 kg)    GENERAL:alert, no distress and  comfortable SKIN: skin color, texture, turgor are normal, no rashes or significant lesions EYES: normal, Conjunctiva are pink and non-injected, sclera clear OROPHARYNX:no exudate, no erythema and lips, buccal mucosa, and tongue normal  NECK: supple, thyroid normal size, non-tender, without nodularity LYMPH:  no palpable lymphadenopathy in the cervical, axillary or inguinal LUNGS: clear to auscultation and percussion with normal breathing effort HEART: regular rate & rhythm and no murmurs and no lower extremity edema ABDOMEN:abdomen soft, non-tender and normal bowel sounds Musculoskeletal:no cyanosis of digits and no clubbing  NEURO: alert & oriented x 3 with fluent speech, no focal motor/sensory deficits LABORATORY DATA:  I have reviewed the data as listed   Chemistry      Component Value Date/Time   NA 138 07/20/2014 1522   K 4.3 07/20/2014 1522   CL 103 07/20/2014 1522   CO2 30 07/20/2014 1522   BUN 19 07/20/2014 1522   CREATININE 0.87 07/20/2014 1522      Component Value Date/Time   CALCIUM 9.8 07/20/2014 1522   ALKPHOS 57 03/30/2013 1111   AST 28 03/30/2013 1111   ALT 22 03/30/2013 1111   BILITOT 0.6 03/30/2013 1111       Lab Results  Component Value Date   WBC 6.3 03/30/2013   HGB 11.1* 10/18/2014   HCT 36.5 03/30/2013   MCV 90.7 03/30/2013   PLT 240.0 03/30/2013   NEUTROABS 3.4 06/17/2010   ASSESSMENT & PLAN:  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 Occasional hot flashes 2. Muscle cramps    Survivorship: Discussed the importance of physical exercise in decreasing the likelihood of breast cancer recurrence. Recommended 30 mins daily 6 days a week of either brisk walking or cycling or swimming. Encouraged patient to eat more fruits and vegetables and decrease red  meat.     return to clinic in 6 months for follow-up for breast exams and surveillance   No orders of the defined types were placed in this encounter.   The patient has a good understanding of the overall plan. she agrees with it. she will call with any problems that may develop before the next visit here.   Rulon Eisenmenger, MD

## 2015-02-06 NOTE — Assessment & Plan Note (Signed)
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 Recommended 30 mins daily 6 days a week of either brisk walking or cycling or swimming. Encouraged patient to eat more fruits and vegetables and decrease red meat.

## 2015-02-08 LAB — URINE CULTURE

## 2015-02-21 ENCOUNTER — Telehealth: Payer: Self-pay | Admitting: Internal Medicine

## 2015-02-21 NOTE — Telephone Encounter (Signed)
Pt was in 8/9 to see Dr. Linna Darner for a uti. It turned out not to be a uti, but she did take all her antibiotic. She is still feeling a lot of pressure on her bladder and she is wondering if she should come back in or have another urinalysis done. Please advise  Her best number is [phone]

## 2015-02-22 ENCOUNTER — Telehealth: Payer: Self-pay | Admitting: *Deleted

## 2015-02-22 ENCOUNTER — Telehealth: Payer: Self-pay

## 2015-02-22 NOTE — Telephone Encounter (Signed)
Would not recommend another culture but if still having the problem she could come back for another visit. Recommend increased fluid intake.

## 2015-02-22 NOTE — Telephone Encounter (Signed)
Returned pt call - advised patient bleeding is a side effect of the tamoxifen  - Dr. Lindi Adie would want her to see gyn, which she has done.  She reports she has been holding the tamoxifen since the 23rd on the advice of the gyn.  Pt reports results will be available from gyn next week.  Requested patient have gyn office send office notes and test results, then we will have her come in and see Dr. Lindi Adie.  Pt voiced understanding.

## 2015-02-22 NOTE — Telephone Encounter (Signed)
Voice mail from patient.  "Returning nurse Terri Franklin's call and was told to ask for desk nurse."

## 2015-02-22 NOTE — Telephone Encounter (Signed)
LMOVM - pt to return call to clinic.   

## 2015-02-22 NOTE — Telephone Encounter (Signed)
CALL FORWARD

## 2015-02-23 NOTE — Telephone Encounter (Signed)
Patient called in and I informed her of Dr. Donneta Romberg note and she said she will stick it out a little longer before making an appointment.

## 2015-03-09 ENCOUNTER — Encounter: Payer: Self-pay | Admitting: Nurse Practitioner

## 2015-03-09 NOTE — Progress Notes (Signed)
The Survivorship Care Plan was mailed to Crystal [name] as she reported not being able to come in to the Survivorship Clinic for an in-person visit at this time. A letter was mailed to her outlining the purpose of the content of the care plan, as well as encouraging her to reach out to me with any questions or concerns.  My business card was included in the correspondence to the patient as well.  A copy of the care plan was also routed/faxed/mailed to [name], MD, the patient's PCP.  I will not be placing any follow-up appointments to the Survivorship Clinic for Crystal [name], but I am happy to see her at any time in the future for any survivorship concerns that may arise. Thank you for allowing me to participate in her care!  Kenn File, [place] [phone]

## 2015-04-10 ENCOUNTER — Telehealth: Payer: Self-pay

## 2015-04-10 NOTE — Telephone Encounter (Signed)
Returned call to pt re vaginal bleeding.  Pt reports her gyn performed biopsy which came back negative.  She reports the lining of her uterus is thickened.  Let pt know we did not receive records from gyn office.  Gyn wants her to remain off tamoxifen.  She is planning on doing a repeat ultrasound in early November.  Let pt know I would let Dr. Lindi Adie, put in a request for appt mid-November and she should expect to hear from scheduling with appointment.  Pt voiced understanding.

## 2015-04-19 ENCOUNTER — Ambulatory Visit (INDEPENDENT_AMBULATORY_CARE_PROVIDER_SITE_OTHER): Payer: BLUE CROSS/BLUE SHIELD | Admitting: Family

## 2015-04-19 ENCOUNTER — Encounter: Payer: Self-pay | Admitting: Family

## 2015-04-19 VITALS — BP 124/84 | HR 81 | Temp 98.2°F | Resp 18 | Ht 70.0 in | Wt 155.4 lb

## 2015-04-19 DIAGNOSIS — H9209 Otalgia, unspecified ear: Secondary | ICD-10-CM | POA: Insufficient documentation

## 2015-04-19 DIAGNOSIS — H9201 Otalgia, right ear: Secondary | ICD-10-CM

## 2015-04-19 NOTE — Progress Notes (Signed)
Pre visit review using our clinic review tool, if applicable. No additional management support is needed unless otherwise documented below in the visit note.  Refused flu shot 

## 2015-04-19 NOTE — Assessment & Plan Note (Signed)
Symptoms and exam of ear discomfort most likely related to excessive ear cleaning. No evidence of infection and ear exam is benign. Discontinue ear cleaning to allow time for improvement. Follow up if symptoms worsen or do not improve.

## 2015-04-19 NOTE — Patient Instructions (Addendum)
Thank you for choosing [facility].  Summary/Instructions:  If your symptoms worsen or fail to improve, please contact our office for further instruction, or in case of emergency go directly to the emergency room at the closest medical facility.   Avoid [place] for next several days.

## 2015-04-19 NOTE — Progress Notes (Signed)
Subjective:    Patient ID: Crystal [name], female    DOB: [birth year], 64 y.o.   MRN: [id number]  Chief Complaint  Patient presents with  . Cerumen Impaction    HPI:  Crystal [name] is a 64 y.o. female who  has a past medical history of BACK PAIN, LUMBAR; FIBROADENOMA, BREAST (2006); PARESTHESIA; Breast cancer, right breast; PONV (postoperative nausea and vomiting); Wears glasses; and Radiation (11/23/14-12/25/14). and presents today for an acute office visit.   1.) Hearing change - Associated symptom of hearing change has been going on for a couple of days that is described as dull and like a cotton ball in her ear. Previously noted to have impacted cerumen and has been trying to remove the cerumen with an at home kit. Reports using that home kit multiple times per day for the past 5 days. Denies changes in hearing or discharge.  No Known Allergies   Current Outpatient Prescriptions on File Prior to Visit  Medication Sig Dispense Refill  . Calcium Carbonate-Vitamin D (CALCIUM 600-D) 600-400 MG-UNIT per tablet Take 2 tablets by mouth daily.      . Cholecalciferol (VITAMIN D3) 1000 UNITS CAPS Take by mouth daily.      . hyaluronate sodium (RADIAPLEXRX) GEL Apply 1 application topically once.    . Inulin (FIBER CHOICE FRUITY BITES PO) Take by mouth.    . Multiple Vitamin (MULTIVITAMIN) tablet Take 1 tablet by mouth daily.      . nitrofurantoin, macrocrystal-monohydrate, (MACROBID) 100 MG capsule Take 1 capsule (100 mg total) by mouth 2 (two) times daily. 14 capsule 0  . phenazopyridine (PYRIDIUM) 200 MG tablet Take 1 tablet (200 mg total) by mouth 3 (three) times daily as needed for pain. 10 tablet 0   No current facility-administered medications on file prior to visit.    Review of Systems  Constitutional: Negative for fever and chills.  HENT: Negative for ear discharge and ear pain.       Objective:    BP 124/84 mmHg  Pulse 81  Temp(Src) 98.2 F (36.8 C) (Oral)  Resp  18  Ht '5\' 10"'  (1.778 m)  Wt 155 lb 6.4 oz (70.489 kg)  BMI 22.30 kg/m2  SpO2 98% Nursing note and vital signs reviewed.  Physical Exam  Constitutional: She is oriented to person, place, and time. She appears well-developed and well-nourished. No distress.  HENT:  Right Ear: Hearing, tympanic membrane, external ear and ear canal normal. No lacerations. No drainage, swelling or tenderness. No foreign bodies. No mastoid tenderness. Tympanic membrane is not injected and not erythematous. No middle ear effusion. No decreased hearing is noted.  Left Ear: Hearing, tympanic membrane, external ear and ear canal normal. No lacerations. No drainage, swelling or tenderness. No foreign bodies. No mastoid tenderness. Tympanic membrane is not injected and not erythematous.  No middle ear effusion. No decreased hearing is noted.  Cardiovascular: Normal rate, regular rhythm, normal heart sounds and intact distal pulses.   Pulmonary/Chest: Effort normal and breath sounds normal.  Neurological: She is alert and oriented to person, place, and time.  Skin: Skin is warm and dry.  Psychiatric: She has a normal mood and affect. Her behavior is normal. Judgment and thought content normal.       Assessment & Plan:   Problem List Items Addressed This Visit      Other   Ear discomfort - Primary    Symptoms and exam of ear discomfort most likely related to excessive ear cleaning.  No evidence of infection and ear exam is benign. Discontinue ear cleaning to allow time for improvement. Follow up if symptoms worsen or do not improve.

## 2015-05-04 ENCOUNTER — Telehealth: Payer: Self-pay | Admitting: Hematology and Oncology

## 2015-05-04 NOTE — Telephone Encounter (Signed)
returned call and s.w. pt and sched appt.Marland KitchenMarland KitchenMarland KitchenMarland Kitchenpt ok adn aware of new d.t

## 2015-05-14 ENCOUNTER — Ambulatory Visit (HOSPITAL_BASED_OUTPATIENT_CLINIC_OR_DEPARTMENT_OTHER): Payer: BLUE CROSS/BLUE SHIELD | Admitting: Hematology and Oncology

## 2015-05-14 ENCOUNTER — Encounter: Payer: Self-pay | Admitting: Hematology and Oncology

## 2015-05-14 VITALS — BP 130/74 | HR 73 | Temp 98.3°F | Resp 18 | Ht 70.0 in | Wt 158.7 lb

## 2015-05-14 DIAGNOSIS — C50511 Malignant neoplasm of lower-outer quadrant of right female breast: Secondary | ICD-10-CM | POA: Diagnosis not present

## 2015-05-14 MED ORDER — ANASTROZOLE 1 MG PO TABS: 1.0000 mg | ORAL_TABLET | Freq: Every day | ORAL | Status: DC

## 2015-05-14 NOTE — Addendum Note (Signed)
Addended by: Jonelle Sports K on: 05/14/2015 04:00 PM   Modules accepted: Medications

## 2015-05-14 NOTE — Progress Notes (Signed)
Patient Care Team: Hoyt Koch, MD as PCP - General (Internal Medicine) Sylvan Cheese, NP as Nurse Practitioner (Nurse Practitioner)  DIAGNOSIS: Breast cancer of lower-outer quadrant of right female breast [facility])   Staging form: Breast, AJCC 7th Edition     Pathologic stage from 10/18/2014: Stage Unknown (Tis (DCIS), NX, cM0) - Unsigned     Clinical: Stage 0 (Tis (DCIS), N0, M0) - Unsigned   SUMMARY OF ONCOLOGIC HISTORY:   Breast cancer of lower-outer quadrant of right female breast ([place])   09/04/2014 Mammogram Right breast with 1 cm new cluster of calcifications at 7:00, 5 cm from nipple; 0.5 cm oval aysmmetry in left medial breast, 3.5 from nipple.     09/08/2014 Breast US Right breast: possible 5 mm oval cyst, hypoechoic, correlates with mammographic findings. no axillary findings.   09/20/2014 Initial Biopsy Right breast needle core bx: DCIS with associated calcifications, ER+ (97%), PR+ (67%)   09/20/2014 Clinical Stage Stage 0: Tis N0   10/18/2014 Definitive Surgery Right breast lumpectomy Ninfa Linden): DCIS, grade 1, calcifications present. ER+ (97%), PR+ (67%). 2.6 cm in size. 0.80mm from nearest margin (posterior). [facility] nomogram risk of recurrence 2% at 10 years with antiestrogen therapy and radiation   10/18/2014 Pathologic Stage pTis, pNx: Stage 0   11/23/2014 - 12/25/2014 Radiation Therapy Adjuvant RT completed ([facility]): right breast 42.72 Gy over 16 fractions; boost to lumpectomy cavity 12 Gy; total dose received: 54.72 Gy.     12/30/2014 -  Anti-estrogen oral therapy Tamoxifen 20 mg daily stopped 02/27/2015 due to uterine hypertrophy and bleeding, D&C 05/30/2015, anastrozole to begin 06/14/2015   03/09/2015 Survivorship A copy of the survivorship care plan was mailed to the patient in lieu of an in-person visit at her request.    CHIEF COMPLIANT: bleeding related to tamoxifen therapy  INTERVAL HISTORY: Crystal [name] is a 64 year old with above-mentioned history of  right breast DCIS treated with lumpectomy and radiation and was taking tamoxifen since July 2016. She started having vaginal bleeding and went to her gynecologist who performed a normal sound as well as biopsies which were all negative. She is scheduled to undergo D&C on 05/30/2015. She is currently off tamoxifen therapy. She is here today to discuss what her alternatives are. She also experienced constipation. Tamoxifen  REVIEW OF SYSTEMS:   Constitutional: Denies fevers, chills or abnormal weight loss Eyes: Denies blurriness of vision Ears, nose, mouth, throat, and face: Denies mucositis or sore throat Respiratory: Denies cough, dyspnea or wheezes Cardiovascular: Denies palpitation, chest discomfort or lower extremity swelling Gastrointestinal:  Constipation with tamoxifen Skin: Denies abnormal skin rashes Lymphatics: Denies new lymphadenopathy or easy bruising Neurological:Denies numbness, tingling or new weaknesses Behavioral/Psych: Mood is stable, no new changes  All other systems were reviewed with the patient and are negative.  I have reviewed the past medical history, past surgical history, social history and family history with the patient and they are unchanged from previous note.  ALLERGIES:  has No Known Allergies.  MEDICATIONS:  Current Outpatient Prescriptions  Medication Sig Dispense Refill  . [START ON 06/13/2015] anastrozole (ARIMIDEX) 1 MG tablet Take 1 tablet (1 mg total) by mouth daily. 90 tablet 3  . Calcium Carbonate-Vitamin D (CALCIUM 600-D) 600-400 MG-UNIT per tablet Take 2 tablets by mouth daily.      . Cholecalciferol (VITAMIN D3) 1000 UNITS CAPS Take by mouth daily.      . Inulin (FIBER CHOICE FRUITY BITES PO) Take by mouth.    . Multiple Vitamin (MULTIVITAMIN)  tablet Take 1 tablet by mouth daily.       No current facility-administered medications for this visit.    PHYSICAL EXAMINATION: ECOG PERFORMANCE STATUS: 1 - Symptomatic but completely  ambulatory  Filed Vitals:   05/14/15 0833  BP: 130/74  Pulse: 73  Temp: 98.3 F (36.8 C)  Resp: 18   Filed Weights   05/14/15 0833  Weight: 158 lb 11.2 oz (71.986 kg)    GENERAL:alert, no distress and comfortable SKIN: skin color, texture, turgor are normal, no rashes or significant lesions EYES: normal, Conjunctiva are pink and non-injected, sclera clear OROPHARYNX:no exudate, no erythema and lips, buccal mucosa, and tongue normal  NECK: supple, thyroid normal size, non-tender, without nodularity LYMPH:  no palpable lymphadenopathy in the cervical, axillary or inguinal LUNGS: clear to auscultation and percussion with normal breathing effort HEART: regular rate & rhythm and no murmurs and no lower extremity edema ABDOMEN:abdomen soft, non-tender and normal bowel sounds Musculoskeletal:no cyanosis of digits and no clubbing  NEURO: alert & oriented x 3 with fluent speech, no focal motor/sensory deficits  LABORATORY DATA:  I have reviewed the data as listed   Chemistry      Component Value Date/Time   NA 138 07/20/2014 1522   K 4.3 07/20/2014 1522   CL 103 07/20/2014 1522   CO2 30 07/20/2014 1522   BUN 19 07/20/2014 1522   CREATININE 0.87 07/20/2014 1522      Component Value Date/Time   CALCIUM 9.8 07/20/2014 1522   ALKPHOS 57 03/30/2013 1111   AST 28 03/30/2013 1111   ALT 22 03/30/2013 1111   BILITOT 0.6 03/30/2013 1111       Lab Results  Component Value Date   WBC 6.3 03/30/2013   HGB 11.1* 10/18/2014   HCT 36.5 03/30/2013   MCV 90.7 03/30/2013   PLT 240.0 03/30/2013   NEUTROABS 3.4 06/17/2010   ASSESSMENT & PLAN:  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 Occasional hot  flashes 2. Muscle cramps  3. Vaginal Bleeding: Biopsy: benign; D/Ced Tamoxifen  I dicussed about starting anastrozole which does not have the risk of vaginal bleeding. However, she may have more myalgias and risk of osteoporosis. She will get a bone density done with her gynecologist. She will call us with these results. I provided her literature on anastrozole. I recommended starting anastrozole mid-December 2016.  If her bone density shows a T score greater than -2 then we may treat her with anastrozole in combination with bisphosphonate therapy or may decide to not treat her with antiestrogen therapy at all.  return to clinic in February for follow-up on anastrozole    No orders of the defined types were placed in this encounter.   The patient has a good understanding of the overall plan. she agrees with it. she will call with any problems that may develop before the next visit here.   Rulon Eisenmenger, MD 05/14/2015

## 2015-05-14 NOTE — Assessment & Plan Note (Signed)
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 Occasional hot flashes 2. Muscle cramps  3. Vaginal Bleeding: Biopsy: benign; D/Ced Tamoxifen I dicussed about starting anastrozole which does not have the risk of vaginal bleeding. However, she may have more myalgias and risk of osteoporosis.  return to clinic in 3 months for follow-up for breast exams and surveillance

## 2015-05-21 ENCOUNTER — Encounter (HOSPITAL_COMMUNITY): Payer: Self-pay

## 2015-05-21 ENCOUNTER — Other Ambulatory Visit: Payer: Self-pay

## 2015-05-21 ENCOUNTER — Other Ambulatory Visit: Payer: Self-pay | Admitting: Obstetrics & Gynecology

## 2015-05-21 ENCOUNTER — Encounter (HOSPITAL_COMMUNITY)
Admission: RE | Admit: 2015-05-21 | Discharge: 2015-05-21 | Disposition: A | Discharge status: Home / Self Care | Payer: BLUE CROSS/BLUE SHIELD | Source: Ambulatory Visit | Attending: Obstetrics & Gynecology | Admitting: Obstetrics & Gynecology

## 2015-05-21 DIAGNOSIS — Z01818 Encounter for other preprocedural examination: Secondary | ICD-10-CM | POA: Insufficient documentation

## 2015-05-21 DIAGNOSIS — R938 Abnormal findings on diagnostic imaging of other specified body structures: Secondary | ICD-10-CM | POA: Insufficient documentation

## 2015-05-21 LAB — CBC
HCT: 37.5 % (ref 36.0–46.0)
Hemoglobin: 12.4 g/dL (ref 12.0–15.0)
MCH: 30.5 pg (ref 26.0–34.0)
MCHC: 33.1 g/dL (ref 30.0–36.0)
MCV: 92.4 fL (ref 78.0–100.0)
PLATELETS: 229 10*3/uL (ref 150–400)
RBC: 4.06 MIL/uL (ref 3.87–5.11)
RDW: 12.5 % (ref 11.5–15.5)
WBC: 5.8 10*3/uL (ref 4.0–10.5)

## 2015-05-21 NOTE — Patient Instructions (Addendum)
Your procedure is scheduled on:  May 30, 2015    Enter through the Main Entrance of [facility] at:  10:00 am   Pick up the phone at the desk and dial [phone].  Call this number if you have problems the morning of surgery: [phone].  Remember: Do NOT eat food: after midnight on Tuesday  Do NOT drink clear liquids after:  After midnight on Tuesday  Take these medicines the morning of surgery with a SIP OF WATER:  None   Do NOT wear jewelry (body piercing), metal hair clips/bobby pins, make-up, or nail polish. Do NOT wear lotions, powders, or perfumes.  You may wear deoderant. Do NOT shave for 48 hours prior to surgery. Do NOT bring valuables to the hospital. Contacts, dentures, or bridgework may not be worn into surgery. Have a responsible adult drive you home and stay with you for 24 hours after your procedure

## 2015-05-21 NOTE — Pre-Procedure Instructions (Signed)
No orders today at PAT visit. I called and left a message for [facility] requesting orders.

## 2015-05-30 ENCOUNTER — Encounter (HOSPITAL_COMMUNITY)
Admission: RE | Disposition: A | Discharge status: Home / Self Care | Payer: Self-pay | Source: Ambulatory Visit | Attending: Obstetrics & Gynecology

## 2015-05-30 ENCOUNTER — Ambulatory Visit (HOSPITAL_COMMUNITY): Payer: BLUE CROSS/BLUE SHIELD | Admitting: Certified Registered Nurse Anesthetist

## 2015-05-30 ENCOUNTER — Encounter (HOSPITAL_COMMUNITY): Payer: Self-pay | Admitting: Certified Registered Nurse Anesthetist

## 2015-05-30 ENCOUNTER — Ambulatory Visit (HOSPITAL_COMMUNITY)
Admission: RE | Admit: 2015-05-30 | Discharge: 2015-05-30 | Disposition: A | Discharge status: Home / Self Care | Payer: BLUE CROSS/BLUE SHIELD | Source: Ambulatory Visit | Attending: Obstetrics & Gynecology | Admitting: Obstetrics & Gynecology

## 2015-05-30 DIAGNOSIS — Z853 Personal history of malignant neoplasm of breast: Secondary | ICD-10-CM | POA: Diagnosis not present

## 2015-05-30 DIAGNOSIS — R938 Abnormal findings on diagnostic imaging of other specified body structures: Secondary | ICD-10-CM | POA: Insufficient documentation

## 2015-05-30 DIAGNOSIS — Z79899 Other long term (current) drug therapy: Secondary | ICD-10-CM | POA: Diagnosis not present

## 2015-05-30 HISTORY — PX: DILATATION & CURRETTAGE/HYSTEROSCOPY WITH RESECTOCOPE: SHX5572

## 2015-05-30 SURGERY — DILATATION & CURETTAGE/HYSTEROSCOPY WITH RESECTOCOPE
Anesthesia: General

## 2015-05-30 MED ORDER — MIDAZOLAM HCL 2 MG/2ML IJ SOLN
INTRAMUSCULAR | Status: DC | PRN
  Administered 2015-05-30: 1 mg via INTRAVENOUS

## 2015-05-30 MED ORDER — GLYCINE 1.5 % IR SOLN
Status: DC | PRN
  Administered 2015-05-30: 3000 mL

## 2015-05-30 MED ORDER — LACTATED RINGERS IV SOLN
INTRAVENOUS | Status: DC
  Administered 2015-05-30 (×2): via INTRAVENOUS

## 2015-05-30 MED ORDER — ONDANSETRON HCL 4 MG/2ML IJ SOLN
INTRAMUSCULAR | Status: AC
  Filled 2015-05-30: qty 2

## 2015-05-30 MED ORDER — PROPOFOL 10 MG/ML IV BOLUS
INTRAVENOUS | Status: DC | PRN
  Administered 2015-05-30: 50 mg via INTRAVENOUS
  Administered 2015-05-30: 150 mg via INTRAVENOUS

## 2015-05-30 MED ORDER — PROPOFOL 10 MG/ML IV BOLUS
INTRAVENOUS | Status: AC
  Filled 2015-05-30: qty 20

## 2015-05-30 MED ORDER — DEXAMETHASONE SODIUM PHOSPHATE 10 MG/ML IJ SOLN
INTRAMUSCULAR | Status: DC | PRN
  Administered 2015-05-30: 4 mg via INTRAVENOUS

## 2015-05-30 MED ORDER — ONDANSETRON HCL 4 MG/2ML IJ SOLN
INTRAMUSCULAR | Status: DC | PRN
  Administered 2015-05-30: 4 mg via INTRAVENOUS

## 2015-05-30 MED ORDER — FENTANYL CITRATE (PF) 100 MCG/2ML IJ SOLN: 25.0000 ug | INTRAMUSCULAR | Status: DC | PRN

## 2015-05-30 MED ORDER — METOCLOPRAMIDE HCL 5 MG/ML IJ SOLN: 10.0000 mg | Freq: Once | INTRAMUSCULAR | Status: DC | PRN

## 2015-05-30 MED ORDER — MEPERIDINE HCL 25 MG/ML IJ SOLN: 6.2500 mg | INTRAMUSCULAR | Status: DC | PRN

## 2015-05-30 MED ORDER — SCOPOLAMINE 1 MG/3DAYS TD PT72
MEDICATED_PATCH | TRANSDERMAL | Status: AC
  Filled 2015-05-30: qty 1

## 2015-05-30 MED ORDER — OXYCODONE-ACETAMINOPHEN 7.5-325 MG PO TABS: 1.0000 | ORAL_TABLET | ORAL | Status: DC | PRN

## 2015-05-30 MED ORDER — FENTANYL CITRATE (PF) 100 MCG/2ML IJ SOLN
INTRAMUSCULAR | Status: DC | PRN
  Administered 2015-05-30: 50 ug via INTRAVENOUS
  Administered 2015-05-30 (×2): 25 ug via INTRAVENOUS

## 2015-05-30 MED ORDER — KETOROLAC TROMETHAMINE 30 MG/ML IJ SOLN
INTRAMUSCULAR | Status: AC
  Filled 2015-05-30: qty 1

## 2015-05-30 MED ORDER — DEXAMETHASONE SODIUM PHOSPHATE 4 MG/ML IJ SOLN
INTRAMUSCULAR | Status: AC
  Filled 2015-05-30: qty 1

## 2015-05-30 MED ORDER — SCOPOLAMINE 1 MG/3DAYS TD PT72
1.0000 | MEDICATED_PATCH | TRANSDERMAL | Status: DC
  Administered 2015-05-30: 1.5 mg via TRANSDERMAL

## 2015-05-30 MED ORDER — CEFAZOLIN SODIUM-DEXTROSE 2-3 GM-% IV SOLR
2.0000 g | INTRAVENOUS | Status: AC
  Administered 2015-05-30: 2 g via INTRAVENOUS

## 2015-05-30 MED ORDER — KETOROLAC TROMETHAMINE 30 MG/ML IJ SOLN
INTRAMUSCULAR | Status: DC | PRN
  Administered 2015-05-30: 30 mg via INTRAVENOUS

## 2015-05-30 MED ORDER — MIDAZOLAM HCL 2 MG/2ML IJ SOLN
INTRAMUSCULAR | Status: AC
  Filled 2015-05-30: qty 2

## 2015-05-30 MED ORDER — CHLOROPROCAINE HCL 1 % IJ SOLN
INTRAMUSCULAR | Status: DC | PRN
  Administered 2015-05-30: 20 mL

## 2015-05-30 MED ORDER — CHLOROPROCAINE HCL 1 % IJ SOLN
INTRAMUSCULAR | Status: AC
  Filled 2015-05-30: qty 30

## 2015-05-30 MED ORDER — LIDOCAINE HCL (CARDIAC) 20 MG/ML IV SOLN
INTRAVENOUS | Status: DC | PRN
  Administered 2015-05-30: 50 mg via INTRAVENOUS

## 2015-05-30 MED ORDER — SCOPOLAMINE 1 MG/3DAYS TD PT72
MEDICATED_PATCH | TRANSDERMAL | Status: AC
Start: 2015-05-30 — End: 2015-05-30
  Administered 2015-05-30: 1.5 mg via TRANSDERMAL
  Filled 2015-05-30: qty 1

## 2015-05-30 MED ORDER — CEFAZOLIN SODIUM-DEXTROSE 2-3 GM-% IV SOLR
INTRAVENOUS | Status: AC
  Filled 2015-05-30: qty 50

## 2015-05-30 MED ORDER — FENTANYL CITRATE (PF) 100 MCG/2ML IJ SOLN
INTRAMUSCULAR | Status: AC
  Filled 2015-05-30: qty 2

## 2015-05-30 SURGICAL SUPPLY — 19 items
ABLATOR ENDOMETRIAL BIPOLAR (ABLATOR) IMPLANT
CANISTER SUCT 3000ML (MISCELLANEOUS) ×3 IMPLANT
CATH ROBINSON RED A/P 16FR (CATHETERS) ×3 IMPLANT
CLOTH BEACON ORANGE TIMEOUT ST (SAFETY) ×3 IMPLANT
CONTAINER PREFILL 10% NBF 60ML (FORM) ×6 IMPLANT
ELECT REM PT RETURN 9FT ADLT (ELECTROSURGICAL) ×3
ELECTRODE REM PT RTRN 9FT ADLT (ELECTROSURGICAL) ×1 IMPLANT
GLOVE BIO SURGEON STRL SZ 6.5 (GLOVE) ×2 IMPLANT
GLOVE BIO SURGEONS STRL SZ 6.5 (GLOVE) ×1
GLOVE BIOGEL PI IND STRL 7.0 (GLOVE) ×2 IMPLANT
GLOVE BIOGEL PI INDICATOR 7.0 (GLOVE) ×4
GOWN STRL REUS W/TWL LRG LVL3 (GOWN DISPOSABLE) ×6 IMPLANT
LOOP ANGLED CUTTING 22FR (CUTTING LOOP) IMPLANT
PACK VAGINAL MINOR WOMEN LF (CUSTOM PROCEDURE TRAY) ×3 IMPLANT
PAD OB MATERNITY 4.3X12.25 (PERSONAL CARE ITEMS) ×3 IMPLANT
TOWEL OR 17X24 6PK STRL BLUE (TOWEL DISPOSABLE) ×6 IMPLANT
TUBING AQUILEX INFLOW (TUBING) ×3 IMPLANT
TUBING AQUILEX OUTFLOW (TUBING) ×3 IMPLANT
WATER STERILE IRR 1000ML POUR (IV SOLUTION) ×3 IMPLANT

## 2015-05-30 NOTE — H&P (Signed)
Crystal [name] is an 64 y.o. female G2P2 married  RP:  Thickened/Cystic Endometrium post Tamoxifen for [facility] D+C  Pertinent Gynecological History:  Contraception: post menopausal status Blood transfusions: none Sexually transmitted diseases: no past history Last mammogram: normal  Last pap: normal    Menstrual History:  No LMP recorded. Patient is postmenopausal.    Past Medical History  Diagnosis Date  . BACK PAIN, LUMBAR   . [place], BREAST 2006  . PARESTHESIA     toes  . Breast cancer, right breast   . PONV (postoperative nausea and vomiting)   . Wears glasses   . Radiation 11/23/14-12/25/14    right breast 42.72 gray, lumpectomy cavity boosted to 54.72 gray    Past Surgical History  Procedure Laterality Date  . Sterotactix needle biopsy  2000    breast calcification  . D & c and hysteroscopy  11/1998    for uterine polyp  . Atrophic vaginitis    . Colonoscopy  09/05/2013  . Dilation and curettage of uterus    . Breast lumpectomy with radioactive seed localization Right 10/18/2014    Procedure: BREAST LUMPECTOMY WITH RADIOACTIVE SEED LOCALIZATION;  Surgeon: Coralie Keens, MD;  Location: [place];  Service: General;  Laterality: Right;    Family History  Problem Relation Age of Onset  . Hypothyroidism Mother   . GER disease Mother   . Osteoporosis Mother   . Hypertension Mother   . Cancer Mother     hodgkins lympoma, uterine cancer  . Hyperlipidemia Father   . Hypertension Father   . Diabetes Father   . Coronary artery disease Father   . Coronary artery disease Maternal Grandfather   . COPD Neg Hx   . Asthma Neg Hx   . Colon cancer Neg Hx   . Esophageal cancer Neg Hx   . Rectal cancer Neg Hx   . Stomach cancer Neg Hx     Social History:  reports that she has never smoked. She has never used smokeless tobacco. She reports that she drinks about 2.5 oz of alcohol per week. She reports that she does not use illicit  drugs.  Allergies: No Known Allergies  No prescriptions prior to admission    ROS neg  There were no vitals taken for this visit. Physical Exam   Sonohysto:  Endometrial lining 7.8 mm and cystic  EBx neg  Assessment/Plan:  Thickened and cystic Endometrium post Tamoxifen.  EBx benign.  For [facility] D+C.  Surgery and risks reviewed.   Ramey Ketcherside,MARIE-LYNE 05/30/2015, 9:36 AM

## 2015-05-30 NOTE — Anesthesia Postprocedure Evaluation (Signed)
Anesthesia Post Note  Patient: Crystal [name]  Procedure(s) Performed: Procedure(s) (LRB): DILATATION & CURETTAGE/HYSTEROSCOPY WITH RESECTOCOPE (N/A)  Patient location during evaluation: PACU Anesthesia Type: General Level of consciousness: awake and alert Pain management: pain level controlled Vital Signs Assessment: post-procedure vital signs reviewed and stable Respiratory status: spontaneous breathing, nonlabored ventilation and respiratory function stable Cardiovascular status: blood pressure returned to baseline and stable Postop Assessment: no signs of nausea or vomiting Anesthetic complications: no    Last Vitals:  Filed Vitals:   05/30/15 1200 05/30/15 1215  BP: 127/76 127/81  Pulse: 70 74  Temp:    Resp: 18 20    Last Pain:  Filed Vitals:   05/30/15 1216  PainSc: 0-No pain                 Mikya Don A.

## 2015-05-30 NOTE — Transfer of Care (Signed)
Immediate Anesthesia Transfer of Care Note  Patient: Crystal [name]  Procedure(s) Performed: Procedure(s): DILATATION & CURETTAGE/HYSTEROSCOPY WITH RESECTOCOPE (N/A)  Patient Location: PACU  Anesthesia Type:General  Level of Consciousness: awake, alert  and oriented  Airway & Oxygen Therapy: Patient Spontanous Breathing and Patient connected to nasal cannula oxygen  Post-op Assessment: Report given to RN and Post -op Vital signs reviewed and stable  Post vital signs: Reviewed and stable  Last Vitals:  Filed Vitals:   05/30/15 1042  BP: 147/86  Pulse: 72  Temp: 37 C  Resp: 16    Complications: No apparent anesthesia complications

## 2015-05-30 NOTE — Discharge Summary (Signed)
  Physician Discharge Summary  Patient ID: Crystal [name] MRN: [id number] DOB/AGE: [birth year] 64 y.o.  Admit date: 05/30/2015 Discharge date: 05/30/2015  Admission Diagnoses: Thickened Cystic Endometrium  Discharge Diagnoses: Intrauterine adhesions        Active Problems:   * No active hospital problems. *   Discharged Condition: good  Hospital Course: Outpatient  Consults: None  Treatments: surgery: Diagnostic Hysteroscopy, D+C, Removal of Adhesions  Disposition: 01-Home or Self Care     Medication List    TAKE these medications        anastrozole 1 MG tablet  Commonly known as:  ARIMIDEX  Take 1 tablet (1 mg total) by mouth daily.  Start taking on:  06/13/2015     CALCIUM 600-D 600-400 MG-UNIT tablet  Generic drug:  Calcium Carbonate-Vitamin D  Take 2 tablets by mouth daily.     FIBER CHOICE FRUITY BITES PO  Take by mouth.     ibuprofen 200 MG tablet  Commonly known as:  ADVIL,MOTRIN  Take 200-400 mg by mouth daily as needed for headache.     multivitamin tablet  Take 1 tablet by mouth daily.     oxyCODONE-acetaminophen 7.5-325 MG tablet  Commonly known as:  PERCOCET  Take 1 tablet by mouth every 4 (four) hours as needed for severe pain.     SYSTANE OP  Apply 1 drop to eye as needed (For dryness.).     Vitamin D3 1000 UNITS Caps  Take by mouth daily.           Follow-up Information    Follow up with [name],MARIE-LYNE, MD In 3 weeks.   Specialty:  Obstetrics and Gynecology   Contact information:   [place] [address] [phone]       Signed: Princess Bruins, MD 05/30/2015, 11:36 AM

## 2015-05-30 NOTE — Op Note (Signed)
05/30/2015  11:25 AM  PATIENT:  Crystal [name]  64 y.o. female  PRE-OPERATIVE DIAGNOSIS:  Thickened Cystic Endometrium  POST-OPERATIVE DIAGNOSIS:  Intrauterine adhesions  PROCEDURE:  Procedure(s): DILATATION & CURETTAGE/DIAGNOSTIC HYSTEROSCOPY  SURGEON:  Surgeon(s): Princess Bruins, MD  ASSISTANTS: none   ANESTHESIA:   general   PROCEDURE:  Under general anesthesia with laryngeal mask the patient is an lithotomy position. She is prepped with Betadine and a suprapubic, vulvar and vaginal areas.  The bladder was catheterized. The vaginal exam reveals a retroverted uterus normal volume and no adnexal mass. The speculum is inserted in the vagina and the anterior lip of the cervix is grasped with a tenaculum. A paracervical block is done with Nesacaine 1% a total of 20 cc at 4 and 8:00. Dilation of the cervix with Hegar dilators up to  #23 without difficulty.  The diagnostic hysteroscope is inserted in the intrauterine cavity.  Inspection is done and pictures are taken.  We note intra-uterine adhesions preventing full extension of the cavity at the fundus.  Those adhesions were probably responsible for the cystic appearance of the endometrium on ultrasound, given that no area of thickening of the endometrium is present on hysteroscopy today.  The hysteroscope is therefore removed.  We proceed with a systematic curettage of the intrauterine cavity with a sharp curette.  The specimen of endometrial curettings is small. It is sent to pathology.  We go back with the diagnostic hysteroscope in the intrauterine cavity.  Inspection reveals that the curettage has removed the adhesions and now the cavity is visualized entirely with both ostia visible.  No lesion is seen. Pictures are taken. Hemostasis is adequate.  All instruments are removed.  The patient is brought to recovery room in good and stable status.  ESTIMATED BLOOD LOSS: 5 cc  FLUID DEFICIT 120 cc   Intake/Output Summary (Last 24  hours) at 05/30/15 1125 Last data filed at 05/30/15 1115  Gross per 24 hour  Intake    600 ml  Output     30 ml  Net    570 ml     BLOOD ADMINISTERED:none   LOCAL MEDICATIONS USED:  Nesacaine 1% 20 cc for Paracervical Block   SPECIMEN:  Source of Specimen:  Endoetrial curettings  DISPOSITION OF SPECIMEN:  PATHOLOGY  COUNTS:  YES  PLAN OF CARE: Transfer to PACU  Princess Bruins MD  05/30/15  At 11:25 am

## 2015-05-30 NOTE — Anesthesia Procedure Notes (Signed)
Procedure Name: LMA Insertion Date/Time: 05/30/2015 10:56 AM Performed by: Bufford Spikes Pre-anesthesia Checklist: Patient identified, Timeout performed, Emergency Drugs available, Suction available and Patient being monitored Patient Re-evaluated:Patient Re-evaluated prior to inductionOxygen Delivery Method: Circle system utilized Preoxygenation: Pre-oxygenation with 100% oxygen Intubation Type: IV induction Ventilation: Mask ventilation without difficulty LMA: LMA inserted LMA Size: 4.0 Tube type: Oral Number of attempts: 1 Placement Confirmation: positive ETCO2 and breath sounds checked- equal and bilateral Tube secured with: Tape Dental Injury: Teeth and Oropharynx as per pre-operative assessment

## 2015-05-30 NOTE — Discharge Instructions (Signed)
Hysteroscopy, Care After  Refer to this sheet in the next few weeks. These instructions provide you with information on caring for yourself after your procedure. Your health care provider may also give you more specific instructions. Your treatment has been planned according to current medical practices, but problems sometimes occur. Call your health care provider if you have any problems or questions after your procedure.   WHAT TO EXPECT AFTER THE PROCEDURE  After your procedure, it is typical to have the following:  · You may have some cramping. This normally lasts for a couple days.  · You may have bleeding. This can vary from light spotting for a few days to menstrual-like bleeding for 3-7 days.  HOME CARE INSTRUCTIONS  · Rest for the first 1-2 days after the procedure.  · Only take over-the-counter or prescription medicines as directed by your health care provider. Do not take aspirin. It can increase the chances of bleeding.  · Take showers instead of baths for 2 weeks or as directed by your health care provider.  · Do not drive for 24 hours or as directed.  · Do not drink alcohol while taking pain medicine.  · Do not use tampons, douche, or have sexual intercourse for 2 weeks or until your health care provider says it is okay.  · Take your temperature twice a day for 4-5 days. Write it down each time.  · Follow your health care provider's advice about diet, exercise, and lifting.  · If you develop constipation, you may:    Take a mild laxative if your health care provider approves.    Add bran foods to your diet.    Drink enough fluids to keep your urine clear or pale yellow.  · Try to have someone with you or available to you for the first 24-48 hours, especially if you were given a general anesthetic.  · Follow up with your health care provider as directed.  SEEK MEDICAL CARE IF:  · You feel dizzy or lightheaded.  · You feel sick to your stomach (nauseous).  · You have abnormal vaginal discharge.  · You  have a rash.  · You have pain that is not controlled with medicine.  SEEK IMMEDIATE MEDICAL CARE IF:  · You have bleeding that is heavier than a normal menstrual period.  · You have a fever.  · You have increasing cramps or pain, not controlled with medicine.  · You have new belly (abdominal) pain.  · You pass out.  · You have pain in the tops of your shoulders (shoulder strap areas).  · You have shortness of breath.     This information is not intended to replace advice given to you by your health care provider. Make sure you discuss any questions you have with your health care provider.     Document Released: 04/06/2013 Document Reviewed: 04/06/2013  Elsevier Interactive Patient Education ©2016 [employer]

## 2015-05-30 NOTE — Anesthesia Preprocedure Evaluation (Signed)
Anesthesia Evaluation  Patient identified by MRN, date of birth, ID band Patient awake    Reviewed: Allergy & Precautions, NPO status , Patient's Chart, lab work & pertinent test results  History of Anesthesia Complications (+) PONV and history of anesthetic complications  Airway Mallampati: II  TM Distance: >3 FB Neck ROM: Full    Dental no notable dental hx. (+) Partial Upper, Partial Lower   Pulmonary neg pulmonary ROS,    Pulmonary exam normal breath sounds clear to auscultation       Cardiovascular negative cardio ROS Normal cardiovascular exam Rhythm:Regular Rate:Normal     Neuro/Psych negative neurological ROS  negative psych ROS   GI/Hepatic negative GI ROS, Neg liver ROS,   Endo/Other  negative endocrine ROS  Renal/GU negative Renal ROS  negative genitourinary   Musculoskeletal negative musculoskeletal ROS (+)   Abdominal   Peds  Hematology negative hematology ROS (+)   Anesthesia Other Findings   Reproductive/Obstetrics Thickened cystic endometrium                             Anesthesia Physical Anesthesia Plan  ASA: II  Anesthesia Plan: General   Post-op Pain Management:    Induction: Intravenous  Airway Management Planned: LMA  Additional Equipment:   Intra-op Plan:   Post-operative Plan: Extubation in OR  Informed Consent: I have reviewed the patients History and Physical, chart, labs and discussed the procedure including the risks, benefits and alternatives for the proposed anesthesia with the patient or authorized representative who has indicated his/her understanding and acceptance.   Dental advisory given  Plan Discussed with: CRNA, Anesthesiologist and Surgeon  Anesthesia Plan Comments:         Anesthesia Quick Evaluation

## 2015-05-31 ENCOUNTER — Encounter (HOSPITAL_COMMUNITY): Payer: Self-pay | Admitting: Obstetrics & Gynecology

## 2015-06-06 ENCOUNTER — Telehealth: Payer: Self-pay | Admitting: *Deleted

## 2015-06-06 NOTE — Telephone Encounter (Signed)
"  I had Bone Density at [facility].  Has Dr. Lindi Adie received results?  The test was done at [facility] because my GYN no longer does Bone densities.  Does he need the 2010 report from Dr. Dellis Filbert?  Do I need to begin the Arimidex on June 14, 2015?  Please call me at [phone]."  Will forward to Dr. Lindi Adie to determine if he needs more results for comparison of Osteopenia and if she is to begin arimidex tomorrow.

## 2015-06-11 NOTE — Telephone Encounter (Deleted)
Seen by Dr, Marko Plume 06-11-2015.

## 2015-06-14 ENCOUNTER — Other Ambulatory Visit: Payer: Self-pay

## 2015-06-14 MED ORDER — ALENDRONATE SODIUM 70 MG PO TABS
70.0000 mg | ORAL_TABLET | ORAL | Status: DC
Start: 2015-06-14 — End: 2015-11-28

## 2015-06-14 NOTE — Progress Notes (Signed)
Let pt know bone density showed osteopenia.  Dr. Lindi Adie would like her to cake Vit D supplement, Calcium supplement, and fosamax as well as start weight bearing exercise.  Pt voiced understanding.  Rx escribe to [employer].

## 2015-07-05 NOTE — Telephone Encounter (Signed)
Progress Notes      Prentiss Bells, RN at 06/14/2015 1:58 PM     Status: Signed       Expand All Collapse All   Let pt know bone density showed osteopenia. Dr. Lindi Adie would like her to cake Vit D supplement, Calcium supplement, and fosamax as well as start weight bearing exercise. Pt voiced understanding. Rx escribe to [employer].

## 2015-07-12 ENCOUNTER — Ambulatory Visit
Admission: RE | Admit: 2015-07-12 | Discharge: 2015-07-12 | Disposition: A | Discharge status: Home / Self Care | Payer: BLUE CROSS/BLUE SHIELD | Source: Ambulatory Visit | Attending: Radiation Oncology | Admitting: Radiation Oncology

## 2015-07-12 ENCOUNTER — Encounter: Payer: Self-pay | Admitting: Radiation Oncology

## 2015-07-12 VITALS — BP 121/82 | HR 73 | Temp 98.7°F | Ht 70.0 in | Wt 157.6 lb

## 2015-07-12 DIAGNOSIS — C50511 Malignant neoplasm of lower-outer quadrant of right female breast: Secondary | ICD-10-CM

## 2015-07-12 NOTE — Progress Notes (Signed)
Crystal [name] here for follow up.  She reports having some soreness in her right underarm area when raising her right arm.  She is taking Arimidex.  She was not able to tolerate tamoxifen.  She reports her energy level is good.  The skin on her right breast is intact.  BP 121/82 mmHg  Pulse 73  Temp(Src) 98.7 F (37.1 C) (Oral)  Ht 5\' 10"  (1.778 m)  Wt 157 lb 9.6 oz (71.487 kg)  BMI 22.61 kg/m2

## 2015-07-12 NOTE — Progress Notes (Signed)
  Radiation Oncology         [phone] ________________________________  Name: Crystal [name] MRN: [id number]  Date: 07/12/2015  DOB: December 06, 1950  Follow-Up Visit Note  CC: Hoyt Koch, MD  Coralie Keens, MD  Diagnosis: Intraductal carcinoma of the right breast  Interval Since Last Radiation:  7 months. 11/23/2014-12/25/2014  Site/dose:   Right breast 42.72 gray in 16 fractions; lumpectomy cavity boost 12 gray,  cumulative dose to the lumpectomy cavity of 54.72 gray  Narrative:  The patient returns today for routine follow-up. Tamoxifen 20 mg daily was stopped on 02/27/2015 due to uterine hypertrophy and bleeding, D&C on 05/30/2015, Anastrozole 1 mg daily began on 06/14/2015. She had a bone density test on 05/23/15 diagnosing her with Osteopenia. She reports having some soreness and tightness in her right axilla when raising her right arm. She denies any nipple discharge or bleeding. She reports her energy level is good. The skin on her right breast is intact.  ALLERGIES:  has No Known Allergies.  Meds: Current Outpatient Prescriptions  Medication Sig Dispense Refill  . alendronate (FOSAMAX) 70 MG tablet Take 1 tablet (70 mg total) by mouth once a week. with full glass of water, on empty stomach, remain upright for 1 hr after. 12 tablet 1  . anastrozole (ARIMIDEX) 1 MG tablet Take 1 tablet (1 mg total) by mouth daily. 90 tablet 3  . Calcium Carbonate-Vitamin D (CALCIUM 600-D) 600-400 MG-UNIT per tablet Take 2 tablets by mouth daily.      . Cholecalciferol (VITAMIN D3) 1000 UNITS CAPS Take by mouth daily.      Marland Kitchen ibuprofen (ADVIL,MOTRIN) 200 MG tablet Take 200-400 mg by mouth daily as needed for headache.    . Inulin (FIBER CHOICE FRUITY BITES PO) Take by mouth.    . Multiple Vitamin (MULTIVITAMIN) tablet Take 1 tablet by mouth daily.      Vladimir Faster Glycol-Propyl Glycol (SYSTANE OP) Apply 1 drop to eye as needed (For dryness.).     No current facility-administered  medications for this encounter.    Physical Findings: The patient is in no acute distress. Patient is alert and oriented.  height is 5\' 10"  (1.778 m) and weight is 157 lb 9.6 oz (71.487 kg). Her oral temperature is 98.7 F (37.1 C). Her blood pressure is 121/82 and her pulse is 73. .  No significant changes. Lungs are clear to auscultation bilaterally. Heart has regular rate and rhythm. No palpable cervical, supraclavicular, or axillary adenopathy. She has some mild hyperpigmentation changes in the right breast. No dominant mass appreciated. No nipple discharge or bleeding.  Impression:  The patient is recovering from the effects of radiation.   Plan: PRN follow up with rad/onc. She will continue close follow up under medical oncology and surgery.  ____________________________________  Blair Promise, PhD, MD  This document serves as a record of services personally performed by Gery Pray, MD. It was created on his behalf by Darcus Austin, a trained medical scribe. The creation of this record is based on the scribe's personal observations and the provider's statements to them. This document has been checked and approved by the attending provider.

## 2015-08-07 ENCOUNTER — Ambulatory Visit (HOSPITAL_BASED_OUTPATIENT_CLINIC_OR_DEPARTMENT_OTHER): Payer: BLUE CROSS/BLUE SHIELD | Admitting: Hematology and Oncology

## 2015-08-07 ENCOUNTER — Encounter: Payer: Self-pay | Admitting: Hematology and Oncology

## 2015-08-07 ENCOUNTER — Telehealth: Payer: Self-pay | Admitting: Hematology and Oncology

## 2015-08-07 VITALS — BP 124/76 | HR 70 | Temp 98.3°F | Resp 18 | Ht 70.0 in | Wt 157.9 lb

## 2015-08-07 DIAGNOSIS — M899 Disorder of bone, unspecified: Secondary | ICD-10-CM

## 2015-08-07 DIAGNOSIS — C50511 Malignant neoplasm of lower-outer quadrant of right female breast: Secondary | ICD-10-CM | POA: Diagnosis not present

## 2015-08-07 NOTE — Progress Notes (Signed)
Patient Care Team: Hoyt Koch, MD as PCP - General (Internal Medicine) Sylvan Cheese, NP as Nurse Practitioner (Nurse Practitioner) Princess Bruins, MD as Consulting Physician (Obstetrics and Gynecology)  DIAGNOSIS: Breast cancer of lower-outer quadrant of right female breast [facility])   Staging form: Breast, AJCC 7th Edition     Pathologic stage from 10/18/2014: Stage Unknown (Tis (DCIS), NX, cM0) - Unsigned     Clinical: Stage 0 (Tis (DCIS), N0, M0) - Unsigned   SUMMARY OF ONCOLOGIC HISTORY:   Breast cancer of lower-outer quadrant of right female breast ([place])   09/04/2014 Mammogram Right breast with 1 cm new cluster of calcifications at 7:00, 5 cm from nipple; 0.5 cm oval aysmmetry in left medial breast, 3.5 from nipple.     09/08/2014 Breast US Right breast: possible 5 mm oval cyst, hypoechoic, correlates with mammographic findings. no axillary findings.   09/20/2014 Initial Biopsy Right breast needle core bx: DCIS with associated calcifications, ER+ (97%), PR+ (67%)   09/20/2014 Clinical Stage Stage 0: Tis N0   10/18/2014 Definitive Surgery Right breast lumpectomy Ninfa Linden): DCIS, grade 1, calcifications present. ER+ (97%), PR+ (67%). 2.6 cm in size. 0.36mm from nearest margin (posterior). [facility] nomogram risk of recurrence 2% at 10 years with antiestrogen therapy and radiation   10/18/2014 Pathologic Stage pTis, pNx: Stage 0   11/23/2014 - 12/25/2014 Radiation Therapy Adjuvant RT completed (Kinard): right breast 42.72 Gy over 16 fractions; boost to lumpectomy cavity 12 Gy; total dose received: 54.72 Gy.     12/30/2014 -  Anti-estrogen oral therapy Tamoxifen 20 mg daily stopped 02/27/2015 due to uterine hypertrophy and bleeding, D&C 05/30/2015, anastrozole to begin 06/14/2015   03/09/2015 Survivorship A copy of the survivorship care plan was mailed to the patient in lieu of an in-person visit at her request.    CHIEF COMPLIANT:  Follow-up on anastrozole  INTERVAL HISTORY:  Crystal [name] is a  40 with above-mentioned history right breast cancer currently on anastrozole and is tolerating it extremely well. Since she is shifted from tamoxifen to anastrozole, she does not have any further problems with bleeding. She denies any hot flashes or myalgias or any other complications. She does yoga once a week. 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 But she does not do any formal exercises.  REVIEW OF SYSTEMS:   Constitutional: Denies fevers, chills or abnormal weight loss Eyes: Denies blurriness of vision Ears, nose, mouth, throat, and face: Denies mucositis or sore throat Respiratory: Denies cough, dyspnea or wheezes Cardiovascular: Denies palpitation, chest discomfort Gastrointestinal:  Denies nausea, heartburn or change in bowel habits Skin: Denies abnormal skin rashes Lymphatics: Denies new lymphadenopathy or easy bruising Neurological:Denies numbness, tingling or new weaknesses Behavioral/Psych: Mood is stable, no new changes  Extremities: No lower extremity edema Breast:  denies any pain or lumps or nodules in either breasts All other systems were reviewed with the patient and are negative.  I have reviewed the past medical history, past surgical history, social history and family history with the patient and they are unchanged from previous note.  ALLERGIES:  has No Known Allergies.  MEDICATIONS:  Current Outpatient Prescriptions  Medication Sig Dispense Refill  . alendronate (FOSAMAX) 70 MG tablet Take 1 tablet (70 mg total) by mouth once a week. with full glass of water, on empty stomach, remain upright for 1 hr after. 12 tablet 1  . anastrozole (ARIMIDEX) 1 MG tablet Take 1 tablet (1  mg total) by mouth daily. 90 tablet 3  . Calcium Carbonate-Vitamin D (CALCIUM 600-D) 600-400 MG-UNIT per tablet Take 2 tablets by mouth daily.      . Cholecalciferol (VITAMIN D3) 1000 UNITS CAPS Take by mouth daily.       Marland Kitchen ibuprofen (ADVIL,MOTRIN) 200 MG tablet Take 200-400 mg by mouth daily as needed for headache.    . Inulin (FIBER CHOICE FRUITY BITES PO) Take by mouth.    . Multiple Vitamin (MULTIVITAMIN) tablet Take 1 tablet by mouth daily.      Vladimir Faster Glycol-Propyl Glycol (SYSTANE OP) Apply 1 drop to eye as needed (For dryness.).     No current facility-administered medications for this visit.    PHYSICAL EXAMINATION: ECOG PERFORMANCE STATUS: 0 - Asymptomatic  Filed Vitals:   08/07/15 0907  BP: 124/76  Pulse: 70  Temp: 98.3 F (36.8 C)  Resp: 18   Filed Weights   08/07/15 0907  Weight: 157 lb 14.4 oz (71.623 kg)    GENERAL:alert, no distress and comfortable SKIN: skin color, texture, turgor are normal, no rashes or significant lesions EYES: normal, Conjunctiva are pink and non-injected, sclera clear OROPHARYNX:no exudate, no erythema and lips, buccal mucosa, and tongue normal  NECK: supple, thyroid normal size, non-tender, without nodularity LYMPH:  no palpable lymphadenopathy in the cervical, axillary or inguinal LUNGS: clear to auscultation and percussion with normal breathing effort HEART: regular rate & rhythm and no murmurs and no lower extremity edema ABDOMEN:abdomen soft, non-tender and normal bowel sounds MUSCULOSKELETAL:no cyanosis of digits and no clubbing  NEURO: alert & oriented x 3 with fluent speech, no focal motor/sensory deficits EXTREMITIES: No lower extremity edema BREAST: No palpable masses or nodules in either right or left breasts. No palpable axillary supraclavicular or infraclavicular adenopathy no breast tenderness or nipple discharge. (exam performed in the presence of a chaperone)  LABORATORY DATA:  I have reviewed the data as listed   Chemistry      Component Value Date/Time   NA 138 07/20/2014 1522   K 4.3 07/20/2014 1522   CL 103 07/20/2014 1522   CO2 30 07/20/2014 1522   BUN 19 07/20/2014 1522   CREATININE 0.87 07/20/2014 1522        Component Value Date/Time   CALCIUM 9.8 07/20/2014 1522   ALKPHOS 57 03/30/2013 1111   AST 28 03/30/2013 1111   ALT 22 03/30/2013 1111   BILITOT 0.6 03/30/2013 1111       Lab Results  Component Value Date   WBC 5.8 05/21/2015   HGB 12.4 05/21/2015   HCT 37.5 05/21/2015   MCV 92.4 05/21/2015   PLT 229 05/21/2015   NEUTROABS 3.4 06/17/2010     ASSESSMENT & PLAN:  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  Tolerating anastrozole extremely well.  Severe osteopenia: T score -2.4 based on bone mineral density done October 2016  Currently on Fosamax. In combination with calcium and vitamin D.   breast cancer surveillance: Mammograms be done in May 2017.  Return to clinic in 6 months for follow-up   No orders of the defined types were placed in this encounter.   The patient has a good understanding of the overall  plan. she agrees with it. she will call with any problems that may develop before the next visit here.   Rulon Eisenmenger, MD 08/07/2015

## 2015-08-07 NOTE — Assessment & Plan Note (Signed)
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 Discussed different options including oral therapy with Fosamax or injection therapy with prolia every 6 months  In combination with calcium and vitamin D.   Return to clinic in 6 months for follow-up

## 2015-08-07 NOTE — Telephone Encounter (Signed)
Appointments made per 2/7 pof avs printed

## 2015-10-02 LAB — HM MAMMOGRAPHY

## 2015-10-04 ENCOUNTER — Encounter: Payer: Self-pay | Admitting: Geriatric Medicine

## 2015-11-28 ENCOUNTER — Other Ambulatory Visit: Payer: Self-pay

## 2015-11-28 MED ORDER — ALENDRONATE SODIUM 70 MG PO TABS: 70.0000 mg | ORAL_TABLET | ORAL | Status: DC

## 2016-02-04 ENCOUNTER — Ambulatory Visit (HOSPITAL_BASED_OUTPATIENT_CLINIC_OR_DEPARTMENT_OTHER): Payer: BLUE CROSS/BLUE SHIELD | Admitting: Hematology and Oncology

## 2016-02-04 ENCOUNTER — Telehealth: Payer: Self-pay | Admitting: Hematology and Oncology

## 2016-02-04 ENCOUNTER — Encounter: Payer: Self-pay | Admitting: Hematology and Oncology

## 2016-02-04 DIAGNOSIS — M859 Disorder of bone density and structure, unspecified: Secondary | ICD-10-CM | POA: Diagnosis not present

## 2016-02-04 DIAGNOSIS — Z79811 Long term (current) use of aromatase inhibitors: Secondary | ICD-10-CM

## 2016-02-04 DIAGNOSIS — C50511 Malignant neoplasm of lower-outer quadrant of right female breast: Secondary | ICD-10-CM

## 2016-02-04 NOTE — Progress Notes (Signed)
Patient Care Team: Hoyt Koch, MD as PCP - General (Internal Medicine) Sylvan Cheese, NP as Nurse Practitioner (Nurse Practitioner) Princess Bruins, MD as Consulting Physician (Obstetrics and Gynecology)  DIAGNOSIS: Breast cancer of lower-outer quadrant of right female breast [facility])   Staging form: Breast, AJCC 7th Edition   - Pathologic stage from 10/18/2014: Stage Unknown (Tis (DCIS), NX, cM0) - Unsigned   - Clinical: Stage 0 (Tis (DCIS), N0, M0) - Unsigned  SUMMARY OF ONCOLOGIC HISTORY:   Breast cancer of lower-outer quadrant of right female breast ([place])   09/04/2014 Mammogram    Right breast with 1 cm new cluster of calcifications at 7:00, 5 cm from nipple; 0.5 cm oval aysmmetry in left medial breast, 3.5 from nipple.       09/08/2014 Breast US    Right breast: possible 5 mm oval cyst, hypoechoic, correlates with mammographic findings. no axillary findings.     09/20/2014 Initial Biopsy    Right breast needle core bx: DCIS with associated calcifications, ER+ (97%), PR+ (67%)     09/20/2014 Clinical Stage    Stage 0: Tis N0     10/18/2014 Definitive Surgery    Right breast lumpectomy Ninfa Linden): DCIS, grade 1, calcifications present. ER+ (97%), PR+ (67%). 2.6 cm in size. 0.57mm from nearest margin (posterior). [facility] nomogram risk of recurrence 2% at 10 years with antiestrogen therapy and radiation     10/18/2014 Pathologic Stage    pTis, pNx: Stage 0     11/23/2014 - 12/25/2014 Radiation Therapy    Adjuvant RT completed (Kinard): right breast 42.72 Gy over 16 fractions; boost to lumpectomy cavity 12 Gy; total dose received: 54.72 Gy.       12/30/2014 -  Anti-estrogen oral therapy    Tamoxifen 20 mg daily stopped 02/27/2015 due to uterine hypertrophy and bleeding, D&C 05/30/2015, anastrozole began 06/14/2015     03/09/2015 Survivorship    A copy of the survivorship care plan was mailed to the patient in lieu of an in-person visit at her request.      CHIEF  COMPLIANT:  Follow-up on anastrozole  INTERVAL HISTORY: Crystal [name] is a  65 year old with  Above-mentioned history of right breast cancer currently on Anastrozole. She does not have any side effects from anastrozole. Because of osteopenia, we started her on Fosamax and she is tolerating it quite well.  REVIEW OF SYSTEMS:   Constitutional: Denies fevers, chills or abnormal weight loss Eyes: Denies blurriness of vision Ears, nose, mouth, throat, and face: Denies mucositis or sore throat Respiratory: Denies cough, dyspnea or wheezes Cardiovascular: Denies palpitation, chest discomfort Gastrointestinal:  Denies nausea, heartburn or change in bowel habits Skin: Denies abnormal skin rashes Lymphatics: Denies new lymphadenopathy or easy bruising Neurological:Denies numbness, tingling or new weaknesses Behavioral/Psych: Mood is stable, no new changes  Extremities: No lower extremity edema Breast:  denies any pain or lumps or nodules in either breasts All other systems were reviewed with the patient and are negative.  I have reviewed the past medical history, past surgical history, social history and family history with the patient and they are unchanged from previous note.  ALLERGIES:  has No Known Allergies.  MEDICATIONS:  Current Outpatient Prescriptions  Medication Sig Dispense Refill  . alendronate (FOSAMAX) 70 MG tablet Take 1 tablet (70 mg total) by mouth once a week. with full glass of water, on empty stomach, remain upright for 1 hr after. 12 tablet 1  . anastrozole (ARIMIDEX) 1 MG tablet Take 1 tablet (  1 mg total) by mouth daily. 90 tablet 3  . Calcium Carbonate-Vitamin D (CALCIUM 600-D) 600-400 MG-UNIT per tablet Take 2 tablets by mouth daily.      . Cholecalciferol (VITAMIN D3) 1000 UNITS CAPS Take by mouth daily.      Marland Kitchen ibuprofen (ADVIL,MOTRIN) 200 MG tablet Take 200-400 mg by mouth daily as needed for headache.    . Inulin (FIBER CHOICE FRUITY BITES PO) Take by mouth.      . Multiple Vitamin (MULTIVITAMIN) tablet Take 1 tablet by mouth daily.      Vladimir Faster Glycol-Propyl Glycol (SYSTANE OP) Apply 1 drop to eye as needed (For dryness.).     No current facility-administered medications for this visit.     PHYSICAL EXAMINATION: ECOG PERFORMANCE STATUS: 0 - Asymptomatic  Vitals:   02/04/16 0850  BP: 124/75  Pulse: 72  Resp: 18  Temp: 98.7 F (37.1 C)   Filed Weights   02/04/16 0850  Weight: 160 lb (72.6 kg)    GENERAL:alert, no distress and comfortable SKIN: skin color, texture, turgor are normal, no rashes or significant lesions EYES: normal, Conjunctiva are pink and non-injected, sclera clear OROPHARYNX:no exudate, no erythema and lips, buccal mucosa, and tongue normal  NECK: supple, thyroid normal size, non-tender, without nodularity LYMPH:  no palpable lymphadenopathy in the cervical, axillary or inguinal LUNGS: clear to auscultation and percussion with normal breathing effort HEART: regular rate & rhythm and no murmurs and no lower extremity edema ABDOMEN:abdomen soft, non-tender and normal bowel sounds MUSCULOSKELETAL:no cyanosis of digits and no clubbing  NEURO: alert & oriented x 3 with fluent speech, no focal motor/sensory deficits EXTREMITIES: No lower extremity edema BREAST: No palpable masses or nodules in either right or left breasts. No palpable axillary supraclavicular or infraclavicular adenopathy no breast tenderness or nipple discharge. (exam performed in the presence of a chaperone)  LABORATORY DATA:  I have reviewed the data as listed   Chemistry      Component Value Date/Time   NA 138 07/20/2014 1522   K 4.3 07/20/2014 1522   CL 103 07/20/2014 1522   CO2 30 07/20/2014 1522   BUN 19 07/20/2014 1522   CREATININE 0.87 07/20/2014 1522      Component Value Date/Time   CALCIUM 9.8 07/20/2014 1522   ALKPHOS 57 03/30/2013 1111   AST 28 03/30/2013 1111   ALT 22 03/30/2013 1111   BILITOT 0.6 03/30/2013 1111       Lab  Results  Component Value Date   WBC 5.8 05/21/2015   HGB 12.4 05/21/2015   HCT 37.5 05/21/2015   MCV 92.4 05/21/2015   PLT 229 05/21/2015   NEUTROABS 3.4 06/17/2010     ASSESSMENT & PLAN:  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  Tolerating anastrozole extremely well.  Severe osteopenia: T score -2.4 based on bone mineral density done October 2016 Currently on Fosamax. In combination with calcium and vitamin D.  Breast cancer surveillance:  1. Mammograms April 2017: normal, breast density category D. 2. Breast Exam:02/04/16: benign  Return to clinic in 1 year for follow-up   No orders of the defined types were placed in this encounter.  The patient has a good  understanding of the overall plan. she agrees with it. she will call with any problems that may develop before the next visit here.   Rulon Eisenmenger, MD 02/04/16

## 2016-02-04 NOTE — Telephone Encounter (Signed)
per pof to sch pt appt-gave pt copy of avs °

## 2016-02-04 NOTE — Assessment & Plan Note (Signed)
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  Tolerating anastrozole extremely well.  Severe osteopenia: T score -2.4 based on bone mineral density done October 2016 Currently on Fosamax. In combination with calcium and vitamin D.  Breast cancer surveillance:  1. Mammograms be done in May 2017. 2. Breast Exam:02/04/16: benign  Return to clinic in 6 months for follow-up

## 2016-02-27 ENCOUNTER — Encounter: Payer: Self-pay | Admitting: Internal Medicine

## 2016-02-27 ENCOUNTER — Other Ambulatory Visit (INDEPENDENT_AMBULATORY_CARE_PROVIDER_SITE_OTHER): Payer: BLUE CROSS/BLUE SHIELD

## 2016-02-27 ENCOUNTER — Ambulatory Visit (INDEPENDENT_AMBULATORY_CARE_PROVIDER_SITE_OTHER): Payer: BLUE CROSS/BLUE SHIELD | Admitting: Internal Medicine

## 2016-02-27 VITALS — BP 128/68 | HR 86 | Temp 98.5°F | Resp 14 | Ht 70.0 in | Wt 159.4 lb

## 2016-02-27 DIAGNOSIS — Z23 Encounter for immunization: Secondary | ICD-10-CM

## 2016-02-27 DIAGNOSIS — Z0000 Encounter for general adult medical examination without abnormal findings: Secondary | ICD-10-CM

## 2016-02-27 DIAGNOSIS — Z1159 Encounter for screening for other viral diseases: Secondary | ICD-10-CM | POA: Diagnosis not present

## 2016-02-27 LAB — VITAMIN B12: Vitamin B-12: 655 pg/mL (ref 211–911)

## 2016-02-27 LAB — CBC
HCT: 36.5 % (ref 36.0–46.0)
Hemoglobin: 12.5 g/dL (ref 12.0–15.0)
MCHC: 34.2 g/dL (ref 30.0–36.0)
MCV: 91.3 fl (ref 78.0–100.0)
PLATELETS: 237 10*3/uL (ref 150.0–400.0)
RBC: 3.99 Mil/uL (ref 3.87–5.11)
RDW: 12.8 % (ref 11.5–15.5)
WBC: 4.8 10*3/uL (ref 4.0–10.5)

## 2016-02-27 LAB — COMPREHENSIVE METABOLIC PANEL
ALBUMIN: 4.2 g/dL (ref 3.5–5.2)
ALK PHOS: 45 U/L (ref 39–117)
ALT: 17 U/L (ref 0–35)
AST: 21 U/L (ref 0–37)
BILIRUBIN TOTAL: 0.5 mg/dL (ref 0.2–1.2)
BUN: 18 mg/dL (ref 6–23)
CALCIUM: 9.6 mg/dL (ref 8.4–10.5)
CO2: 31 meq/L (ref 19–32)
CREATININE: 0.99 mg/dL (ref 0.40–1.20)
Chloride: 103 mEq/L (ref 96–112)
GFR: 59.94 mL/min — ABNORMAL LOW (ref >60.00)
Glucose, Bld: 69 mg/dL — ABNORMAL LOW (ref 70–99)
Potassium: 4.5 mEq/L (ref 3.5–5.1)
Sodium: 141 mEq/L (ref 135–145)
TOTAL PROTEIN: 7.2 g/dL (ref 6.0–8.3)

## 2016-02-27 LAB — LIPID PANEL
CHOL/HDL RATIO: 2
CHOLESTEROL: 200 mg/dL (ref 0–200)
HDL: 84.2 mg/dL (ref >39.00)
LDL CALC: 104 mg/dL — AB (ref 0–99)
NonHDL: 115.5
TRIGLYCERIDES: 57 mg/dL (ref 0.0–149.0)
VLDL: 11.4 mg/dL (ref 0.0–40.0)

## 2016-02-27 LAB — VITAMIN D 25 HYDROXY (VIT D DEFICIENCY, FRACTURES): VITD: 35.73 ng/mL (ref 30.00–100.00)

## 2016-02-27 LAB — TSH: TSH: 1.81 u[IU]/mL (ref 0.35–4.50)

## 2016-02-27 NOTE — Patient Instructions (Signed)
We have given you the flu shot today.   We are checking the labs and will call you back with the results.   Health Maintenance, Female Adopting a healthy lifestyle and getting preventive care can go a long way to promote health and wellness. Talk with your health care provider about what schedule of regular examinations is right for you. This is a good chance for you to check in with your provider about disease prevention and staying healthy. In between checkups, there are plenty of things you can do on your own. Experts have done a lot of research about which lifestyle changes and preventive measures are most likely to keep you healthy. Ask your health care provider for more information. WEIGHT AND DIET  Eat a healthy diet  Be sure to include plenty of vegetables, fruits, low-fat dairy products, and lean protein.  Do not eat a lot of foods high in solid fats, added sugars, or salt.  Get regular exercise. This is one of the most important things you can do for your health.  Most adults should exercise for at least 150 minutes each week. The exercise should increase your heart rate and make you sweat (moderate-intensity exercise).  Most adults should also do strengthening exercises at least twice a week. This is in addition to the moderate-intensity exercise.  Maintain a healthy weight  Body mass index (BMI) is a measurement that can be used to identify possible weight problems. It estimates body fat based on height and weight. Your health care provider can help determine your BMI and help you achieve or maintain a healthy weight.  For females 43 years of age and older:   A BMI below 18.5 is considered underweight.  A BMI of 18.5 to 24.9 is normal.  A BMI of 25 to 29.9 is considered overweight.  A BMI of 30 and above is considered obese.  Watch levels of cholesterol and blood lipids  You should start having your blood tested for lipids and cholesterol at 64 years of age, then have  this test every 5 years.  You may need to have your cholesterol levels checked more often if:  Your lipid or cholesterol levels are high.  You are older than 64 years of age.  You are at high risk for heart disease.  CANCER SCREENING   Lung Cancer  Lung cancer screening is recommended for adults 68-70 years old who are at high risk for lung cancer because of a history of smoking.  A yearly low-dose CT scan of the lungs is recommended for people who:  Currently smoke.  Have quit within the past 15 years.  Have at least a 30-pack-year history of smoking. A pack year is smoking an average of one pack of cigarettes a day for 1 year.  Yearly screening should continue until it has been 15 years since you quit.  Yearly screening should stop if you develop a health problem that would prevent you from having lung cancer treatment.  Breast Cancer  Practice breast self-awareness. This means understanding how your breasts normally appear and feel.  It also means doing regular breast self-exams. Let your health care provider know about any changes, no matter how small.  If you are in your 20s or 30s, you should have a clinical breast exam (CBE) by a health care provider every 1-3 years as part of a regular health exam.  If you are 61 or older, have a CBE every year. Also consider having a breast X-ray (  mammogram) every year.  If you have a family history of breast cancer, talk to your health care provider about genetic screening.  If you are at high risk for breast cancer, talk to your health care provider about having an MRI and a mammogram every year.  Breast cancer gene (BRCA) assessment is recommended for women who have family members with BRCA-related cancers. BRCA-related cancers include:  Breast.  Ovarian.  Tubal.  Peritoneal cancers.  Results of the assessment will determine the need for genetic counseling and BRCA1 and BRCA2 testing. Cervical Cancer Your health care  provider may recommend that you be screened regularly for cancer of the pelvic organs (ovaries, uterus, and vagina). This screening involves a pelvic examination, including checking for microscopic changes to the surface of your cervix (Pap test). You may be encouraged to have this screening done every 3 years, beginning at age 76.  For women ages 35-65, health care providers may recommend pelvic exams and Pap testing every 3 years, or they may recommend the Pap and pelvic exam, combined with testing for human papilloma virus (HPV), every 5 years. Some types of HPV increase your risk of cervical cancer. Testing for HPV may also be done on women of any age with unclear Pap test results.  Other health care providers may not recommend any screening for nonpregnant women who are considered low risk for pelvic cancer and who do not have symptoms. Ask your health care provider if a screening pelvic exam is right for you.  If you have had past treatment for cervical cancer or a condition that could lead to cancer, you need Pap tests and screening for cancer for at least 20 years after your treatment. If Pap tests have been discontinued, your risk factors (such as having a new sexual partner) need to be reassessed to determine if screening should resume. Some women have medical problems that increase the chance of getting cervical cancer. In these cases, your health care provider may recommend more frequent screening and Pap tests. Colorectal Cancer  This type of cancer can be detected and often prevented.  Routine colorectal cancer screening usually begins at 64 years of age and continues through 64 years of age.  Your health care provider may recommend screening at an earlier age if you have risk factors for colon cancer.  Your health care provider may also recommend using home test kits to check for hidden blood in the stool.  A small camera at the end of a tube can be used to examine your colon directly  (sigmoidoscopy or colonoscopy). This is done to check for the earliest forms of colorectal cancer.  Routine screening usually begins at age 30.  Direct examination of the colon should be repeated every 5-10 years through 64 years of age. However, you may need to be screened more often if early forms of precancerous polyps or small growths are found. Skin Cancer  Check your skin from head to toe regularly.  Tell your health care provider about any new moles or changes in moles, especially if there is a change in a mole's shape or color.  Also tell your health care provider if you have a mole that is larger than the size of a pencil eraser.  Always use sunscreen. Apply sunscreen liberally and repeatedly throughout the day.  Protect yourself by wearing long sleeves, pants, a wide-brimmed hat, and sunglasses whenever you are outside. HEART DISEASE, DIABETES, AND HIGH BLOOD PRESSURE   High blood pressure causes heart disease  and increases the risk of stroke. High blood pressure is more likely to develop in:  People who have blood pressure in the high end of the normal range (130-139/85-89 mm Hg).  People who are overweight or obese.  People who are African American.  If you are 5-58 years of age, have your blood pressure checked every 3-5 years. If you are 32 years of age or older, have your blood pressure checked every year. You should have your blood pressure measured twice--once when you are at a hospital or clinic, and once when you are not at a hospital or clinic. Record the average of the two measurements. To check your blood pressure when you are not at a hospital or clinic, you can use:  An automated blood pressure machine at a pharmacy.  A home blood pressure monitor.  If you are between 84 years and 54 years old, ask your health care provider if you should take aspirin to prevent strokes.  Have regular diabetes screenings. This involves taking a blood sample to check your  fasting blood sugar level.  If you are at a normal weight and have a low risk for diabetes, have this test once every three years after 65 years of age.  If you are overweight and have a high risk for diabetes, consider being tested at a younger age or more often. PREVENTING INFECTION  Hepatitis B  If you have a higher risk for hepatitis B, you should be screened for this virus. You are considered at high risk for hepatitis B if:  You were born in a country where hepatitis B is common. Ask your health care provider which countries are considered high risk.  Your parents were born in a high-risk country, and you have not been immunized against hepatitis B (hepatitis B vaccine).  You have HIV or AIDS.  You use needles to inject street drugs.  You live with someone who has hepatitis B.  You have had sex with someone who has hepatitis B.  You get hemodialysis treatment.  You take certain medicines for conditions, including cancer, organ transplantation, and autoimmune conditions. Hepatitis C  Blood testing is recommended for:  Everyone born from [birth year] through 1964.  Anyone with known risk factors for hepatitis C. Sexually transmitted infections (STIs)  You should be screened for sexually transmitted infections (STIs) including gonorrhea and chlamydia if:  You are sexually active and are younger than 64 years of age.  You are older than 64 years of age and your health care provider tells you that you are at risk for this type of infection.  Your sexual activity has changed since you were last screened and you are at an increased risk for chlamydia or gonorrhea. Ask your health care provider if you are at risk.  If you do not have HIV, but are at risk, it may be recommended that you take a prescription medicine daily to prevent HIV infection. This is called pre-exposure prophylaxis (PrEP). You are considered at risk if:  You are sexually active and do not regularly use condoms or  know the HIV status of your partner(s).  You take drugs by injection.  You are sexually active with a partner who has HIV. Talk with your health care provider about whether you are at high risk of being infected with HIV. If you choose to begin PrEP, you should first be tested for HIV. You should then be tested every 3 months for as long as you are taking PrEP.  PREGNANCY   If you are premenopausal and you may become pregnant, ask your health care provider about preconception counseling.  If you may become pregnant, take 400 to 800 micrograms (mcg) of folic acid every day.  If you want to prevent pregnancy, talk to your health care provider about birth control (contraception). OSTEOPOROSIS AND MENOPAUSE   Osteoporosis is a disease in which the bones lose minerals and strength with aging. This can result in serious bone fractures. Your risk for osteoporosis can be identified using a bone density scan.  If you are 31 years of age or older, or if you are at risk for osteoporosis and fractures, ask your health care provider if you should be screened.  Ask your health care provider whether you should take a calcium or vitamin D supplement to lower your risk for osteoporosis.  Menopause may have certain physical symptoms and risks.  Hormone replacement therapy may reduce some of these symptoms and risks. Talk to your health care provider about whether hormone replacement therapy is right for you.  HOME CARE INSTRUCTIONS   Schedule regular health, dental, and eye exams.  Stay current with your immunizations.   Do not use any tobacco products including cigarettes, chewing tobacco, or electronic cigarettes.  If you are pregnant, do not drink alcohol.  If you are breastfeeding, limit how much and how often you drink alcohol.  Limit alcohol intake to no more than 1 drink per day for nonpregnant women. One drink equals 12 ounces of beer, 5 ounces of wine, or 1 ounces of hard liquor.  Do  not use street drugs.  Do not share needles.  Ask your health care provider for help if you need support or information about quitting drugs.  Tell your health care provider if you often feel depressed.  Tell your health care provider if you have ever been abused or do not feel safe at home.   This information is not intended to replace advice given to you by your health care provider. Make sure you discuss any questions you have with your health care provider.   Document Released: 12/30/2010 Document Revised: 07/07/2014 Document Reviewed: 05/18/2013 Elsevier Interactive Patient Education [employer].

## 2016-02-27 NOTE — Progress Notes (Signed)
   Subjective:    Patient ID: Crystal [name], female    DOB: March 21, 1951, 65 y.o.   MRN: [id number]  HPI The patient is a 65 YO female coming in for annual exam. No new concerns. Undergoing breast cancer treatment.   PMH, [facility], social history reviewed and updated.   Review of Systems  Constitutional: Negative for activity change, appetite change, chills, fatigue and unexpected weight change.  HENT: Negative.   Eyes: Negative.   Respiratory: Negative for cough, chest tightness, shortness of breath and wheezing.   Cardiovascular: Negative for chest pain, palpitations and leg swelling.  Gastrointestinal: Negative for abdominal distention, abdominal pain, constipation and diarrhea.  Musculoskeletal: Negative.   Skin: Negative.   Neurological: Negative.   Psychiatric/Behavioral: Negative.       Objective:   Physical Exam  Constitutional: She is oriented to person, place, and time. She appears well-developed and well-nourished.  HENT:  Head: Normocephalic and atraumatic.  Eyes: EOM are normal.  Neck: Normal range of motion.  Cardiovascular: Normal rate and regular rhythm.   Carotids without bruit  Pulmonary/Chest: Effort normal and breath sounds normal. No respiratory distress. She has no wheezes. She has no rales.  Abdominal: Soft. Bowel sounds are normal. She exhibits no distension. There is no tenderness. There is no rebound.  Musculoskeletal: She exhibits no edema.  Neurological: She is alert and oriented to person, place, and time. Coordination normal.  Skin: Skin is warm and dry.  Some varicose veins on the left foot  Psychiatric: She has a normal mood and affect.   Vitals:   02/27/16 0813  BP: 128/68  Pulse: 86  Resp: 14  Temp: 98.5 F (36.9 C)  TempSrc: Oral  SpO2: 98%  Weight: 159 lb 6.4 oz (72.3 kg)  Height: 5\' 10"  (1.778 m)      Assessment & Plan:  Flu shot given at visit.

## 2016-02-27 NOTE — Progress Notes (Signed)
Pre visit review using our clinic review tool, if applicable. No additional management support is needed unless otherwise documented below in the visit note. 

## 2016-02-27 NOTE — Assessment & Plan Note (Signed)
Colonoscopy due 2025, tdap up to date. Given flu shot at visit. Shingles completed. Mammogram up to date as well as pap smear. Checking hep c screening. Counseled on sun safety and mole surveillance as well as the dangers of distracted driving. Given screening recommendations.

## 2016-02-28 LAB — HEPATITIS C ANTIBODY: HCV Ab: NEGATIVE

## 2016-02-29 ENCOUNTER — Ambulatory Visit (INDEPENDENT_AMBULATORY_CARE_PROVIDER_SITE_OTHER): Payer: BLUE CROSS/BLUE SHIELD | Admitting: Internal Medicine

## 2016-02-29 ENCOUNTER — Other Ambulatory Visit: Payer: BLUE CROSS/BLUE SHIELD

## 2016-02-29 ENCOUNTER — Encounter: Payer: Self-pay | Admitting: Internal Medicine

## 2016-02-29 VITALS — BP 132/80 | HR 82 | Resp 10 | Wt 160.0 lb

## 2016-02-29 DIAGNOSIS — R300 Dysuria: Secondary | ICD-10-CM | POA: Diagnosis not present

## 2016-02-29 LAB — POCT URINALYSIS DIPSTICK
Bilirubin, UA: NEGATIVE
GLUCOSE UA: NEGATIVE
Ketones, UA: NEGATIVE
NITRITE UA: NEGATIVE
Protein, UA: NEGATIVE
Spec Grav, UA: 1.015
UROBILINOGEN UA: NEGATIVE
pH, UA: 6.5

## 2016-02-29 MED ORDER — LEVOFLOXACIN 500 MG PO TABS: 500.0000 mg | ORAL_TABLET | Freq: Every day | ORAL | 0 refills | Status: AC

## 2016-02-29 NOTE — Progress Notes (Signed)
Subjective:    Patient ID: Crystal [name], female    DOB: August 28, 1950, 65 y.o.   MRN: [id number]  HPI  Here for acute visit with GU complaints, has had recurring dysuria and freq since July 25, was tx empirically with septra x 3 days per GYN, then later with recurrent symptoms and abnormaly ua/cx with nitrofuratoin x 5 days.  Unfortunately now with recurrence same symptoms of 2 days dysuria/freq but Denies urinary symptoms such as urgency, flank pain, hematuria or n/v, fever, chills. Past Medical History:  Diagnosis Date  . BACK PAIN, LUMBAR   . Breast cancer, right breast   . FIBROADENOMA, BREAST 2006  . PARESTHESIA    toes  . PONV (postoperative nausea and vomiting)   . Radiation 11/23/14-12/25/14   right breast 42.72 gray, lumpectomy cavity boosted to 54.72 gray  . Wears glasses    Past Surgical History:  Procedure Laterality Date  . atrophic vaginitis    . BREAST LUMPECTOMY WITH RADIOACTIVE SEED LOCALIZATION Right 10/18/2014   Procedure: BREAST LUMPECTOMY WITH RADIOACTIVE SEED LOCALIZATION;  Surgeon: Coralie Keens, MD;  Location: [facility];  Service: General;  Laterality: Right;  . COLONOSCOPY  09/05/2013  . D & C and hysteroscopy  11/1998   for uterine polyp  . DILATATION & CURRETTAGE/HYSTEROSCOPY WITH RESECTOCOPE N/A 05/30/2015   Procedure: DILATATION & CURETTAGE/HYSTEROSCOPY WITH RESECTOCOPE;  Surgeon: Princess Bruins, MD;  Location: [facility];  Service: Gynecology;  Laterality: N/A;  . DILATION AND CURETTAGE OF UTERUS    . Sterotactix needle biopsy  2000   breast calcification    reports that she has never smoked. She has never used smokeless tobacco. She reports that she drinks about 2.5 oz of alcohol per week . She reports that she does not use drugs. family history includes Cancer in her mother; Coronary artery disease in her father and maternal grandfather; Diabetes in her father; GER disease in her mother; Hyperlipidemia in her father; Hypertension in her  father and mother; Hypothyroidism in her mother; Osteoporosis in her mother. No Known Allergies Current Outpatient Prescriptions on File Prior to Visit  Medication Sig Dispense Refill  . alendronate (FOSAMAX) 70 MG tablet Take 1 tablet (70 mg total) by mouth once a week. with full glass of water, on empty stomach, remain upright for 1 hr after. 12 tablet 1  . anastrozole (ARIMIDEX) 1 MG tablet Take 1 tablet (1 mg total) by mouth daily. 90 tablet 3  . Calcium Carbonate-Vitamin D (CALCIUM 600-D) 600-400 MG-UNIT per tablet Take 1 tablet by mouth daily.     . Cholecalciferol (VITAMIN D3) 1000 UNITS CAPS Take by mouth daily.      Marland Kitchen ibuprofen (ADVIL,MOTRIN) 200 MG tablet Take 200-400 mg by mouth daily as needed for headache.    . Inulin (FIBER CHOICE FRUITY BITES PO) Take by mouth.    . Multiple Vitamin (MULTIVITAMIN) tablet Take 1 tablet by mouth daily.      Vladimir Faster Glycol-Propyl Glycol (SYSTANE OP) Apply 1 drop to eye as needed (For dryness.).     No current facility-administered medications on file prior to visit.    Review of Systems  All otherwise neg per pt       Objective:   Physical Exam BP 132/80   Pulse 82   Resp 10   Wt 160 lb (72.6 kg)   SpO2 97%   BMI 22.96 kg/m  VS noted,  Constitutional: Pt appears in no apparent distress HENT: Head: NCAT.  Right Ear:  External ear normal.  Left Ear: External ear normal.  Eyes: . Pupils are equal, round, and reactive to light. Conjunctivae and EOM are normal Neck: Normal range of motion. Neck supple.  Cardiovascular: Normal rate and regular rhythm.   Pulmonary/Chest: Effort normal and breath sounds without rales or wheezing.  Abd:  Soft,  ND, + BS with tender low mid abd, no falnk tender Neurological: Pt is alert. Not confused , motor grossly intact Skin: Skin is warm. No rash, no LE edema Psychiatric: Pt behavior is normal. No agitation.   POCT urinalysis dipstick  Order: [id number]  Status:  Final result Visible to patient:   No (Not Released) Dx:  Dysuria    Ref Range & Units 11:38 57yr ago 60yr ago   Color, UA  yellow yello    Clarity, UA  cloudy clear    Glucose, UA  negative neg    Bilirubin, UA  negative neg    Ketones, UA  negative neg    Spec Grav, UA  1.015 1.010    Blood, UA  2+ 10    pH, UA  6.5 6.0    Protein, UA  negative neg    Urobilinogen, UA  negative 0.2 0.2   Nitrite, UA  negative neg    Leukocytes, UA Negative small (1+)              Assessment & Plan:

## 2016-02-29 NOTE — Patient Instructions (Signed)
Please take all new medication as prescribed - the antibiotic  Your specimen will be sent for culture, and you should be notified if any changes in treatment need to be made in a few days  Please continue all other medications as before, and refills have been done if requested.  Please have the pharmacy call with any other refills you may need.  Please keep your appointments with your specialists as you may have planned

## 2016-02-29 NOTE — Progress Notes (Signed)
Pre visit review using our clinic review tool, if applicable. No additional management support is needed unless otherwise documented below in the visit note. 

## 2016-03-01 NOTE — Assessment & Plan Note (Signed)
C/w recurrent uti, Udip reviewed, for urine cx, empiric cipro bid x 10 days based on recent ecoli sensitivities,  to f/u any worsening symptoms or concerns

## 2016-03-03 LAB — CULTURE, URINE COMPREHENSIVE

## 2016-05-13 ENCOUNTER — Other Ambulatory Visit: Payer: Self-pay

## 2016-05-13 MED ORDER — ALENDRONATE SODIUM 70 MG PO TABS: 70.0000 mg | ORAL_TABLET | ORAL | 1 refills | Status: DC

## 2016-05-28 ENCOUNTER — Other Ambulatory Visit: Payer: BLUE CROSS/BLUE SHIELD

## 2016-05-28 ENCOUNTER — Encounter: Payer: Self-pay | Admitting: Internal Medicine

## 2016-05-28 ENCOUNTER — Ambulatory Visit (INDEPENDENT_AMBULATORY_CARE_PROVIDER_SITE_OTHER): Payer: BLUE CROSS/BLUE SHIELD | Admitting: Internal Medicine

## 2016-05-28 VITALS — BP 114/82 | HR 82 | Temp 98.9°F | Resp 16 | Wt 162.0 lb

## 2016-05-28 DIAGNOSIS — N3000 Acute cystitis without hematuria: Secondary | ICD-10-CM | POA: Insufficient documentation

## 2016-05-28 DIAGNOSIS — R300 Dysuria: Secondary | ICD-10-CM

## 2016-05-28 LAB — POCT URINALYSIS DIPSTICK
Bilirubin, UA: NEGATIVE
Glucose, UA: NEGATIVE
Ketones, UA: NEGATIVE
NITRITE UA: NEGATIVE
PH UA: 6
PROTEIN UA: NEGATIVE
Spec Grav, UA: 1.01
UROBILINOGEN UA: 0.2

## 2016-05-28 MED ORDER — CEPHALEXIN 500 MG PO CAPS: 500.0000 mg | ORAL_CAPSULE | Freq: Two times a day (BID) | ORAL | 0 refills | Status: DC

## 2016-05-28 NOTE — Assessment & Plan Note (Signed)
Urine dip c/w UTI Start Keflex - has good sensitivity according to last Cx - will avoid cipro given possible neuropathy Send for cx Fluids, tylenol/advil as needed

## 2016-05-28 NOTE — Progress Notes (Signed)
Subjective:    Patient ID: Crystal [name], female    DOB: Oct 21, 1950, 65 y.o.   MRN: [id number]  HPI She is here for an acute visit.   UTI:  Her symptoms started yesterday.  She has frequent urination, dysuria and the urine looks cloudy.  She denies abdominal pain, nausea, hematuria and fever.    Her last UTI was over the summer.  She was treated with bactrim x 3 days.  It recurred two weeks later.  She was given a second antibiotic (nitrofuratoin x 5 days) and then it returned two weeks after that and she had her third antibiotic, which cleared the infection up (cipro x 10 days).    Medications and allergies reviewed with patient and updated if appropriate.  Patient Active Problem List   Diagnosis Date Noted  . Dysuria 02/29/2016  . Breast cancer of lower-outer quadrant of right female breast ([place]) 10/24/2014  . Routine health maintenance 03/30/2013  . PARESTHESIA 06/25/2010    Current Outpatient Prescriptions on File Prior to Visit  Medication Sig Dispense Refill  . alendronate (FOSAMAX) 70 MG tablet Take 1 tablet (70 mg total) by mouth once a week. with full glass of water, on empty stomach, remain upright for 1 hr after. 12 tablet 1  . anastrozole (ARIMIDEX) 1 MG tablet Take 1 tablet (1 mg total) by mouth daily. 90 tablet 3  . Calcium Carbonate-Vitamin D (CALCIUM 600-D) 600-400 MG-UNIT per tablet Take 1 tablet by mouth daily.     . Cholecalciferol (VITAMIN D3) 1000 UNITS CAPS Take by mouth daily.      Marland Kitchen ibuprofen (ADVIL,MOTRIN) 200 MG tablet Take 200-400 mg by mouth daily as needed for headache.    . Inulin (FIBER CHOICE FRUITY BITES PO) Take by mouth.    . Multiple Vitamin (MULTIVITAMIN) tablet Take 1 tablet by mouth daily.      Vladimir Faster Glycol-Propyl Glycol (SYSTANE OP) Apply 1 drop to eye as needed (For dryness.).     No current facility-administered medications on file prior to visit.     Past Medical History:  Diagnosis Date  . BACK PAIN, LUMBAR   . Breast  cancer, right breast   . FIBROADENOMA, BREAST 2006  . PARESTHESIA    toes  . PONV (postoperative nausea and vomiting)   . Radiation 11/23/14-12/25/14   right breast 42.72 gray, lumpectomy cavity boosted to 54.72 gray  . Wears glasses     Past Surgical History:  Procedure Laterality Date  . atrophic vaginitis    . BREAST LUMPECTOMY WITH RADIOACTIVE SEED LOCALIZATION Right 10/18/2014   Procedure: BREAST LUMPECTOMY WITH RADIOACTIVE SEED LOCALIZATION;  Surgeon: Coralie Keens, MD;  Location: [facility];  Service: General;  Laterality: Right;  . COLONOSCOPY  09/05/2013  . D & C and hysteroscopy  11/1998   for uterine polyp  . DILATATION & CURRETTAGE/HYSTEROSCOPY WITH RESECTOCOPE N/A 05/30/2015   Procedure: DILATATION & CURETTAGE/HYSTEROSCOPY WITH RESECTOCOPE;  Surgeon: Princess Bruins, MD;  Location: [facility];  Service: Gynecology;  Laterality: N/A;  . DILATION AND CURETTAGE OF UTERUS    . Sterotactix needle biopsy  2000   breast calcification    Social History   Social History  . Marital status: Married    Spouse name: howard  . Number of children: 2  . Years of education: 16   Occupational History  . [employer]   Social History Main Topics  . Smoking status: Never Smoker  . Smokeless tobacco:  Never Used  . Alcohol use 2.5 oz/week    5 Standard drinks or equivalent per week     Comment: beer  . Drug use: No  . Sexual activity: Yes    Partners: Male   Other Topics Concern  . Not on file   Social History Narrative   HSG, [place], Married '78,  1 son '81, 1 daughter '84, 1 grandson '12. 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 ACP - discussed with patient. Refer - [email or web address]    Family History  Problem Relation Age of Onset  . Hypothyroidism Mother   . GER disease Mother   . Osteoporosis Mother   . Hypertension Mother   . Cancer Mother     hodgkins lympoma, uterine cancer  .  Hyperlipidemia Father   . Hypertension Father   . Diabetes Father   . Coronary artery disease Father   . Coronary artery disease Maternal Grandfather   . COPD Neg Hx   . Asthma Neg Hx   . Colon cancer Neg Hx   . Esophageal cancer Neg Hx   . Rectal cancer Neg Hx   . Stomach cancer Neg Hx     Review of Systems  Constitutional: Negative for fever.  Gastrointestinal: Negative for abdominal pain and nausea.  Genitourinary: Positive for dysuria and frequency. Negative for hematuria.  Musculoskeletal: Negative for back pain.  Neurological: Negative for light-headedness and headaches.       Objective:   Vitals:   05/28/16 0909  BP: 114/82  Pulse: 82  Resp: 16  Temp: 98.9 F (37.2 C)   Filed Weights   05/28/16 0909  Weight: 162 lb (73.5 kg)   Body mass index is 23.24 kg/m.   Physical Exam  Constitutional: She appears well-developed and well-nourished. No distress.  Abdominal: Soft. She exhibits no distension and no mass. There is no tenderness. There is no rebound and no guarding.  Genitourinary:  Genitourinary Comments: No CVA tenderness  Skin: She is not diaphoretic.        Assessment & Plan:   See Problem List for Assessment and Plan of chronic medical problems.

## 2016-05-28 NOTE — Patient Instructions (Addendum)
An antibiotic was sent to your pharmacy.  Take the entire prescription.  Call with any questions.     Urinary Tract Infection, Adult A urinary tract infection (UTI) is an infection of any part of the urinary tract, which includes the kidneys, ureters, bladder, and urethra. These organs make, store, and get rid of urine in the body. UTI can be a bladder infection (cystitis) or kidney infection (pyelonephritis). What are the causes? This infection may be caused by fungi, viruses, or bacteria. Bacteria are the most common cause of UTIs. This condition can also be caused by repeated incomplete emptying of the bladder during urination. What increases the risk? This condition is more likely to develop if:  You ignore your need to urinate or hold urine for long periods of time.  You do not empty your bladder completely during urination.  You wipe back to front after urinating or having a bowel movement, if you are female.  You are uncircumcised, if you are female.  You are constipated.  You have a urinary catheter that stays in place (indwelling).  You have a weak defense (immune) system.  You have a medical condition that affects your bowels, kidneys, or bladder.  You have diabetes.  You take antibiotic medicines frequently or for long periods of time, and the antibiotics no longer work well against certain types of infections (antibiotic resistance).  You take medicines that irritate your urinary tract.  You are exposed to chemicals that irritate your urinary tract.  You are female. What are the signs or symptoms? Symptoms of this condition include:  Fever.  Frequent urination or passing small amounts of urine frequently.  Needing to urinate urgently.  Pain or burning with urination.  Urine that smells bad or unusual.  Cloudy urine.  Pain in the lower abdomen or back.  Trouble urinating.  Blood in the urine.  Vomiting or being less hungry than normal.  Diarrhea or  abdominal pain.  Vaginal discharge, if you are female. How is this diagnosed? This condition is diagnosed with a medical history and physical exam. You will also need to provide a urine sample to test your urine. Other tests may be done, including:  Blood tests.  Sexually transmitted disease (STD) testing. If you have had more than one UTI, a cystoscopy or imaging studies may be done to determine the cause of the infections. How is this treated? Treatment for this condition often includes a combination of two or more of the following:  Antibiotic medicine.  Other medicines to treat less common causes of UTI.  Over-the-counter medicines to treat pain.  Drinking enough water to stay hydrated. Follow these instructions at home:  Take over-the-counter and prescription medicines only as told by your health care provider.  If you were prescribed an antibiotic, take it as told by your health care provider. Do not stop taking the antibiotic even if you start to feel better.  Avoid alcohol, caffeine, tea, and carbonated beverages. They can irritate your bladder.  Drink enough fluid to keep your urine clear or pale yellow.  Keep all follow-up visits as told by your health care provider. This is important.  Make sure to:  Empty your bladder often and completely. Do not hold urine for long periods of time.  Empty your bladder before and after sex.  Wipe from front to back after a bowel movement if you are female. Use each tissue one time when you wipe. Contact a health care provider if:  You have back  pain.  You have a fever.  You feel nauseous or vomit.  Your symptoms do not get better after 3 days.  Your symptoms go away and then return. Get help right away if:  You have severe back pain or lower abdominal pain.  You are vomiting and cannot keep down any medicines or water. This information is not intended to replace advice given to you by your health care provider. Make  sure you discuss any questions you have with your health care provider. Document Released: 03/26/2005 Document Revised: 11/28/2015 Document Reviewed: 05/07/2015 Elsevier Interactive Patient Education  2017 [employer].

## 2016-05-28 NOTE — Progress Notes (Signed)
Pre visit review using our clinic review tool, if applicable. No additional management support is needed unless otherwise documented below in the visit note. 

## 2016-05-30 LAB — URINE CULTURE

## 2016-06-15 ENCOUNTER — Other Ambulatory Visit: Payer: Self-pay | Admitting: Hematology and Oncology

## 2016-06-15 DIAGNOSIS — C50511 Malignant neoplasm of lower-outer quadrant of right female breast: Secondary | ICD-10-CM

## 2016-08-21 ENCOUNTER — Other Ambulatory Visit: Payer: Self-pay | Admitting: Surgery

## 2016-09-18 ENCOUNTER — Ambulatory Visit (INDEPENDENT_AMBULATORY_CARE_PROVIDER_SITE_OTHER): Payer: BLUE CROSS/BLUE SHIELD | Admitting: Internal Medicine

## 2016-09-18 ENCOUNTER — Encounter: Payer: Self-pay | Admitting: Internal Medicine

## 2016-09-18 DIAGNOSIS — B349 Viral infection, unspecified: Secondary | ICD-10-CM | POA: Diagnosis not present

## 2016-09-18 MED ORDER — HYDROCODONE-HOMATROPINE 5-1.5 MG/5ML PO SYRP: 5.0000 mL | ORAL_SOLUTION | Freq: Three times a day (TID) | ORAL | 0 refills | Status: DC | PRN

## 2016-09-18 NOTE — Assessment & Plan Note (Signed)
Rx for hycodan cough syrup and advised to start zyrtec. Can use otc cold medicines as needed as well. Counseled about the lack of need for prednisone or antibiotics and typical course.

## 2016-09-18 NOTE — Progress Notes (Signed)
Pre visit review using our clinic review tool, if applicable. No additional management support is needed unless otherwise documented below in the visit note. 

## 2016-09-18 NOTE — Progress Notes (Signed)
   Subjective:    Patient ID: Crystal [name], female    DOB: 12-Jul-1950, 66 y.o.   MRN: [id number]  HPI The patient is a 65 YO female coming in for fevers and cough with nasal congestion for about 5 days now. She is taking delsym and alka seltzer cold medicine with some relief. She does have some 99 temps at home for the last several days. She is having non-productive cough. She is having post-nasal drip and congestion. Overall worsening since onset. Has grandchildren which she keeps sometimes which are in daycare and have been sick. No muscle aches or headaches.   Review of Systems  Constitutional: Positive for activity change, appetite change, chills and fever. Negative for fatigue and unexpected weight change.  HENT: Positive for congestion, postnasal drip, rhinorrhea and sinus pressure. Negative for drooling, ear discharge, ear pain, nosebleeds, sinus pain, sore throat and trouble swallowing.   Eyes: Negative.   Respiratory: Positive for cough. Negative for chest tightness, shortness of breath and wheezing.   Cardiovascular: Negative.   Gastrointestinal: Negative.   Musculoskeletal: Negative.   Neurological: Negative.       Objective:   Physical Exam  Constitutional: She is oriented to person, place, and time. She appears well-developed and well-nourished.  HENT:  Head: Normocephalic and atraumatic.  Right Ear: External ear normal.  Left Ear: External ear normal.  Oropharynx with redness and clear drainage, nose without crusting.   Eyes: EOM are normal.  Neck: Normal range of motion. No JVD present.  Cardiovascular: Normal rate and regular rhythm.   Pulmonary/Chest: Effort normal and breath sounds normal. No respiratory distress. She has no wheezes. She has no rales.  Abdominal: Soft. She exhibits no distension. There is no tenderness. There is no rebound.  Lymphadenopathy:    She has no cervical adenopathy.  Neurological: She is alert and oriented to person, place, and time.    Skin: Skin is warm and dry.   Vitals:   09/18/16 1054  BP: 120/70  Pulse: 97  Resp: 14  Temp: 99.3 F (37.4 C)  TempSrc: Oral  SpO2: 98%  Weight: 156 lb (70.8 kg)  Height: 5\' 10"  (1.778 m)      Assessment & Plan:

## 2016-09-18 NOTE — Patient Instructions (Signed)
You can start taking zyrtec (cetirizine) one pill a day to help with the drainage. Take this for 1-2 weeks to help.   We have given you the cough medicine and the cough could last for another couple of weeks before it is all gone.

## 2016-10-27 ENCOUNTER — Other Ambulatory Visit: Payer: Self-pay | Admitting: Hematology and Oncology

## 2016-11-25 ENCOUNTER — Ambulatory Visit (INDEPENDENT_AMBULATORY_CARE_PROVIDER_SITE_OTHER): Payer: BLUE CROSS/BLUE SHIELD | Admitting: Nurse Practitioner

## 2016-11-25 ENCOUNTER — Encounter: Payer: Self-pay | Admitting: Nurse Practitioner

## 2016-11-25 VITALS — BP 116/80 | HR 67 | Temp 99.1°F | Ht 70.0 in | Wt 155.0 lb

## 2016-11-25 DIAGNOSIS — S01112A Laceration without foreign body of left eyelid and periocular area, initial encounter: Secondary | ICD-10-CM

## 2016-11-25 NOTE — Patient Instructions (Signed)
Wound care instructions given to patient.  Changed dressing at least once a day. Return to office if any sign of infection.  You are up to date with Tdap vaccine.

## 2016-11-25 NOTE — Progress Notes (Signed)
Subjective:  Patient ID: Crystal [name], female    DOB: 1950/07/31  Age: 66 y.o. MRN: [id number]  CC: Bleeding/Bruising (left pointer finger bleeding since yeserday, still bleeding,stop when apply pressure. )   Wound Check  She was originally treated yesterday. Previous treatment included wound cleansing or irrigation. Her temperature was unmeasured prior to arrival. There has been bloody discharge from the wound. There is no redness present. There is no swelling present. The pain has improved. She has no difficulty moving the affected extremity or digit.    Outpatient Medications Prior to Visit  Medication Sig Dispense Refill  . alendronate (FOSAMAX) 70 MG tablet take 1 tablet by mouth every week on an empty stomach with A 12 tablet 1  . anastrozole (ARIMIDEX) 1 MG tablet take 1 tablet by mouth once daily 90 tablet 3  . Calcium Carbonate-Vitamin D (CALCIUM 600-D) 600-400 MG-UNIT per tablet Take 1 tablet by mouth daily.     . Cholecalciferol (VITAMIN D3) 1000 UNITS CAPS Take by mouth daily.      Marland Kitchen HYDROcodone-homatropine (HYCODAN) 5-1.5 MG/5ML syrup Take 5 mLs by mouth every 8 (eight) hours as needed for cough. 120 mL 0  . ibuprofen (ADVIL,MOTRIN) 200 MG tablet Take 200-400 mg by mouth daily as needed for headache.    . Inulin (FIBER CHOICE FRUITY BITES PO) Take by mouth.    . Multiple Vitamin (MULTIVITAMIN) tablet Take 1 tablet by mouth daily.      Vladimir Faster Glycol-Propyl Glycol (SYSTANE OP) Apply 1 drop to eye as needed (For dryness.).     No facility-administered medications prior to visit.     ROS See HPI  Objective:  BP 116/80   Pulse 67   Temp 99.1 F (37.3 C)   Ht 5\' 10"  (1.778 m)   Wt 155 lb (70.3 kg)   SpO2 98%   BMI 22.24 kg/m   BP Readings from Last 3 Encounters:  11/25/16 116/80  09/18/16 120/70  05/28/16 114/82    Wt Readings from Last 3 Encounters:  11/25/16 155 lb (70.3 kg)  09/18/16 156 lb (70.8 kg)  05/28/16 162 lb (73.5 kg)    Physical Exam    Constitutional: She is oriented to person, place, and time. No distress.  Cardiovascular: Normal rate.   Pulmonary/Chest: Effort normal.  Musculoskeletal: She exhibits no edema or tenderness.       Hands: Neurological: She is alert and oriented to person, place, and time.  Skin: Skin is warm and dry.  Vitals reviewed.   Lab Results  Component Value Date   WBC 4.8 02/27/2016   HGB 12.5 02/27/2016   HCT 36.5 02/27/2016   PLT 237.0 02/27/2016   GLUCOSE 69 (L) 02/27/2016   CHOL 200 02/27/2016   TRIG 57.0 02/27/2016   HDL 84.20 02/27/2016   LDLCALC 104 (H) 02/27/2016   ALT 17 02/27/2016   AST 21 02/27/2016   NA 141 02/27/2016   K 4.5 02/27/2016   CL 103 02/27/2016   CREATININE 0.99 02/27/2016   BUN 18 02/27/2016   CO2 31 02/27/2016   TSH 1.81 02/27/2016    No results found.  Assessment & Plan:   Kennette was seen today for bleeding/bruising.  Diagnoses and all orders for this visit:  Laceration of skin of left eyelid and periocular area, initial encounter   I am having Ms. Zigmund Daniel maintain her Calcium Carbonate-Vitamin D, multivitamin, Vitamin D3, Inulin (FIBER CHOICE FRUITY BITES PO), ibuprofen, Polyethyl Glycol-Propyl Glycol (SYSTANE OP), anastrozole, HYDROcodone-homatropine, and alendronate.  No orders of the defined types were placed in this encounter.   Follow-up: Return if symptoms worsen or fail to improve.  Wilfred Lacy, NP

## 2016-12-17 ENCOUNTER — Encounter: Payer: Self-pay | Admitting: Nurse Practitioner

## 2016-12-17 ENCOUNTER — Ambulatory Visit (INDEPENDENT_AMBULATORY_CARE_PROVIDER_SITE_OTHER): Payer: BLUE CROSS/BLUE SHIELD | Admitting: Nurse Practitioner

## 2016-12-17 ENCOUNTER — Ambulatory Visit (INDEPENDENT_AMBULATORY_CARE_PROVIDER_SITE_OTHER)
Admission: RE | Admit: 2016-12-17 | Discharge: 2016-12-17 | Disposition: A | Discharge status: Home / Self Care | Payer: BLUE CROSS/BLUE SHIELD | Source: Ambulatory Visit | Attending: Nurse Practitioner | Admitting: Nurse Practitioner

## 2016-12-17 ENCOUNTER — Encounter: Payer: Self-pay | Admitting: Neurology

## 2016-12-17 VITALS — BP 110/78 | HR 79 | Temp 98.7°F | Ht 70.0 in | Wt 156.0 lb

## 2016-12-17 DIAGNOSIS — R200 Anesthesia of skin: Secondary | ICD-10-CM

## 2016-12-17 NOTE — Patient Instructions (Addendum)
Go to basement for lumbar spine X-ray.  Lumbar spine x-ray indicates DDD with disc space narrowing.  Entered referral to neurology.

## 2016-12-17 NOTE — Progress Notes (Signed)
Subjective:  Patient ID: Crystal [name], female    DOB: 12-16-1950  Age: 66 y.o. MRN: [id number]  CC: Numbness (numbness,tingling on toes-)   HPI  Paresthesia of both feet: Ongoing since 2011 Worsening over the last few months, R>L.  Outpatient Medications Prior to Visit  Medication Sig Dispense Refill  . alendronate (FOSAMAX) 70 MG tablet take 1 tablet by mouth every week on an empty stomach with A 12 tablet 1  . anastrozole (ARIMIDEX) 1 MG tablet take 1 tablet by mouth once daily 90 tablet 3  . Calcium Carbonate-Vitamin D (CALCIUM 600-D) 600-400 MG-UNIT per tablet Take 1 tablet by mouth daily.     . Cholecalciferol (VITAMIN D3) 1000 UNITS CAPS Take by mouth daily.      Marland Kitchen ibuprofen (ADVIL,MOTRIN) 200 MG tablet Take 200-400 mg by mouth daily as needed for headache.    . Inulin (FIBER CHOICE FRUITY BITES PO) Take by mouth.    . Multiple Vitamin (MULTIVITAMIN) tablet Take 1 tablet by mouth daily.      Vladimir Faster Glycol-Propyl Glycol (SYSTANE OP) Apply 1 drop to eye as needed (For dryness.).    Marland Kitchen HYDROcodone-homatropine (HYCODAN) 5-1.5 MG/5ML syrup Take 5 mLs by mouth every 8 (eight) hours as needed for cough. (Patient not taking: Reported on 12/17/2016) 120 mL 0   No facility-administered medications prior to visit.     ROS Review of Systems  Constitutional: Negative for chills, fever, malaise/fatigue and weight loss.  Cardiovascular: Negative.   Musculoskeletal: Negative.   Skin: Negative.   Neurological: Positive for tingling and sensory change. Negative for tremors, focal weakness and weakness.     Objective:  BP 110/78   Pulse 79   Temp 98.7 F (37.1 C)   Ht 5\' 10"  (1.778 m)   Wt 156 lb (70.8 kg)   SpO2 99%   BMI 22.38 kg/m   BP Readings from Last 3 Encounters:  12/17/16 110/78  11/25/16 116/80  09/18/16 120/70    Wt Readings from Last 3 Encounters:  12/17/16 156 lb (70.8 kg)  11/25/16 155 lb (70.3 kg)  09/18/16 156 lb (70.8 kg)    Physical Exam    Constitutional: She is oriented to person, place, and time. No distress.  Cardiovascular: Normal rate.   Pulmonary/Chest: Effort normal.  Musculoskeletal: Normal range of motion. She exhibits no edema or tenderness.  Normal gait. Normal and equal microfilament and vibration sensation.  Neurological: She is alert and oriented to person, place, and time. She has normal reflexes.  Skin: Skin is warm and dry. No rash noted. No erythema.  Psychiatric: She has a normal mood and affect. Her behavior is normal.  Vitals reviewed.   Lab Results  Component Value Date   WBC 4.8 02/27/2016   HGB 12.5 02/27/2016   HCT 36.5 02/27/2016   PLT 237.0 02/27/2016   GLUCOSE 69 (L) 02/27/2016   CHOL 200 02/27/2016   TRIG 57.0 02/27/2016   HDL 84.20 02/27/2016   LDLCALC 104 (H) 02/27/2016   ALT 17 02/27/2016   AST 21 02/27/2016   NA 141 02/27/2016   K 4.5 02/27/2016   CL 103 02/27/2016   CREATININE 0.99 02/27/2016   BUN 18 02/27/2016   CO2 31 02/27/2016   TSH 1.81 02/27/2016    No results found.  Assessment & Plan:   Ninah was seen today for numbness.  Diagnoses and all orders for this visit:  Numbness of toes -     DG Lumbar Spine Complete; Future -  Ambulatory referral to Neurology   I am having Ms. Zigmund Daniel maintain her Calcium Carbonate-Vitamin D, multivitamin, Vitamin D3, Inulin (FIBER CHOICE FRUITY BITES PO), ibuprofen, Polyethyl Glycol-Propyl Glycol (SYSTANE OP), anastrozole, HYDROcodone-homatropine, and alendronate.  No orders of the defined types were placed in this encounter.   Follow-up: No Follow-up on file.  Wilfred Lacy, NP

## 2017-01-30 ENCOUNTER — Other Ambulatory Visit: Payer: Self-pay | Admitting: Adult Health

## 2017-01-30 DIAGNOSIS — C50511 Malignant neoplasm of lower-outer quadrant of right female breast: Secondary | ICD-10-CM

## 2017-02-02 ENCOUNTER — Encounter: Payer: Self-pay | Admitting: Hematology and Oncology

## 2017-02-02 ENCOUNTER — Ambulatory Visit (HOSPITAL_BASED_OUTPATIENT_CLINIC_OR_DEPARTMENT_OTHER): Payer: BLUE CROSS/BLUE SHIELD | Admitting: Hematology and Oncology

## 2017-02-02 ENCOUNTER — Ambulatory Visit (HOSPITAL_BASED_OUTPATIENT_CLINIC_OR_DEPARTMENT_OTHER): Payer: BLUE CROSS/BLUE SHIELD

## 2017-02-02 VITALS — BP 131/79 | HR 66 | Temp 98.0°F | Resp 18 | Ht 70.0 in | Wt 158.2 lb

## 2017-02-02 DIAGNOSIS — C50511 Malignant neoplasm of lower-outer quadrant of right female breast: Secondary | ICD-10-CM

## 2017-02-02 DIAGNOSIS — M8588 Other specified disorders of bone density and structure, other site: Secondary | ICD-10-CM

## 2017-02-02 DIAGNOSIS — Z170 Estrogen receptor positive status [ER+]: Secondary | ICD-10-CM

## 2017-02-02 DIAGNOSIS — M810 Age-related osteoporosis without current pathological fracture: Secondary | ICD-10-CM

## 2017-02-02 LAB — COMPREHENSIVE METABOLIC PANEL
ALT: 17 U/L (ref 0–55)
ANION GAP: 6 meq/L (ref 3–11)
AST: 21 U/L (ref 5–34)
Albumin: 3.7 g/dL (ref 3.5–5.0)
Alkaline Phosphatase: 57 U/L (ref 40–150)
BILIRUBIN TOTAL: 0.4 mg/dL (ref 0.20–1.20)
BUN: 18.9 mg/dL (ref 7.0–26.0)
CALCIUM: 9.7 mg/dL (ref 8.4–10.4)
CHLORIDE: 105 meq/L (ref 98–109)
CO2: 31 meq/L — AB (ref 22–29)
Creatinine: 0.9 mg/dL (ref 0.6–1.1)
EGFR: 69 mL/min/{1.73_m2} — AB (ref >90)
GLUCOSE: 71 mg/dL (ref 70–140)
Potassium: 4.3 mEq/L (ref 3.5–5.1)
Sodium: 141 mEq/L (ref 136–145)
Total Protein: 6.9 g/dL (ref 6.4–8.3)

## 2017-02-02 LAB — CBC WITH DIFFERENTIAL/PLATELET
BASO%: 0.5 % (ref 0.0–2.0)
BASOS ABS: 0 10*3/uL (ref 0.0–0.1)
EOS%: 3 % (ref 0.0–7.0)
Eosinophils Absolute: 0.2 10*3/uL (ref 0.0–0.5)
HCT: 37 % (ref 34.8–46.6)
HGB: 12.4 g/dL (ref 11.6–15.9)
LYMPH%: 24.4 % (ref 14.0–49.7)
MCH: 31.2 pg (ref 25.1–34.0)
MCHC: 33.6 g/dL (ref 31.5–36.0)
MCV: 92.9 fL (ref 79.5–101.0)
MONO#: 0.4 10*3/uL (ref 0.1–0.9)
MONO%: 8.6 % (ref 0.0–14.0)
NEUT#: 3.3 10*3/uL (ref 1.5–6.5)
NEUT%: 63.5 % (ref 38.4–76.8)
PLATELETS: 205 10*3/uL (ref 145–400)
RBC: 3.98 10*6/uL (ref 3.70–5.45)
RDW: 12.8 % (ref 11.2–14.5)
WBC: 5.2 10*3/uL (ref 3.9–10.3)
lymph#: 1.3 10*3/uL (ref 0.9–3.3)

## 2017-02-02 MED ORDER — ALENDRONATE SODIUM 70 MG PO TABS: ORAL_TABLET | ORAL | 3 refills | Status: DC

## 2017-02-02 MED ORDER — ANASTROZOLE 1 MG PO TABS: 1.0000 mg | ORAL_TABLET | Freq: Every day | ORAL | 3 refills | Status: DC

## 2017-02-02 NOTE — Assessment & Plan Note (Signed)
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 Tolerating anastrozole extremely well.  Severe osteopenia: T score -2.4 based on bone mineral density done October 2016 Currently on Fosamax. In combination with calcium and vitamin D.  Breast cancer surveillance:  1. Mammograms April 2018: normal, breast density category D. 2. Breast Exam:02/02/17: benign  Return to clinic in 1 year for follow-up

## 2017-02-02 NOTE — Progress Notes (Signed)
Patient Care Team: Hoyt Koch, MD as PCP - General (Internal Medicine) Sylvan Cheese, NP as Nurse Practitioner (Nurse Practitioner) Princess Bruins, MD as Consulting Physician (Obstetrics and Gynecology)  DIAGNOSIS:  Encounter Diagnoses  Name Primary?  . Post-menopausal osteoporosis Yes  . Malignant neoplasm of lower-outer quadrant of right breast of female, estrogen receptor positive ([place])     SUMMARY OF ONCOLOGIC HISTORY:   Breast cancer of lower-outer quadrant of right female breast ([place])   09/04/2014 Mammogram    Right breast with 1 cm new cluster of calcifications at 7:00, 5 cm from nipple; 0.5 cm oval aysmmetry in left medial breast, 3.5 from nipple.        09/08/2014 Breast US    Right breast: possible 5 mm oval cyst, hypoechoic, correlates with mammographic findings. no axillary findings.      09/20/2014 Initial Biopsy    Right breast needle core bx: DCIS with associated calcifications, ER+ (97%), PR+ (67%)      09/20/2014 Clinical Stage    Stage 0: Tis N0      10/18/2014 Definitive Surgery    Right breast lumpectomy [facility]): DCIS, grade 1, calcifications present. ER+ (97%), PR+ (67%). 2.6 cm in size. 0.27mm from nearest margin (posterior). [facility] nomogram risk of recurrence 2% at 10 years with antiestrogen therapy and radiation      10/18/2014 Pathologic Stage    pTis, pNx: Stage 0      11/23/2014 - 12/25/2014 Radiation Therapy    Adjuvant RT completed ([facility]): right breast 42.72 Gy over 16 fractions; boost to lumpectomy cavity 12 Gy; total dose received: 54.72 Gy.        12/30/2014 -  Anti-estrogen oral therapy    Tamoxifen 20 mg daily stopped 02/27/2015 due to uterine hypertrophy and bleeding, D&C 05/30/2015, anastrozole began 06/14/2015      03/09/2015 Survivorship    A copy of the survivorship care plan was mailed to the patient in lieu of an in-person visit at her request.       CHIEF COMPLIANT: follow-up on anastrozole  therapy  INTERVAL HISTORY: Crystal [name] is a 66 year old with above-mentioned history of right breast DCIS was currently on anastrozole. She is tolerating anastrozole extremely well. She does not have any hot flashes or myalgias. She denies any lumps or nodules in the breasts.  REVIEW OF SYSTEMS:   Constitutional: Denies fevers, chills or abnormal weight loss Eyes: Denies blurriness of vision Ears, nose, mouth, throat, and face: Denies mucositis or sore throat Respiratory: Denies cough, dyspnea or wheezes Cardiovascular: Denies palpitation, chest discomfort Gastrointestinal:  Denies nausea, heartburn or change in bowel habits Skin: Denies abnormal skin rashes Lymphatics: Denies new lymphadenopathy or easy bruising Neurological:Denies numbness, tingling or new weaknesses Behavioral/Psych: Mood is stable, no new changes  Extremities: No lower extremity edema Breast:  denies any pain or lumps or nodules in either breasts All other systems were reviewed with the patient and are negative.  I have reviewed the past medical history, past surgical history, social history and family history with the patient and they are unchanged from previous note.  ALLERGIES:  has No Known Allergies.  MEDICATIONS:  Current Outpatient Prescriptions  Medication Sig Dispense Refill  . alendronate (FOSAMAX) 70 MG tablet take 1 tablet by mouth every week on an empty stomach with A 12 tablet 3  . anastrozole (ARIMIDEX) 1 MG tablet Take 1 tablet (1 mg total) by mouth daily. 90 tablet 3  . Calcium Carbonate-Vitamin D (CALCIUM 600-D) 600-400 MG-UNIT per  tablet Take 1 tablet by mouth daily.     . Cholecalciferol (VITAMIN D3) 1000 UNITS CAPS Take by mouth daily.      Marland Kitchen ibuprofen (ADVIL,MOTRIN) 200 MG tablet Take 200-400 mg by mouth daily as needed for headache.    . Inulin (FIBER CHOICE FRUITY BITES PO) Take by mouth.    . Multiple Vitamin (MULTIVITAMIN) tablet Take 1 tablet by mouth daily.      Vladimir Faster  Glycol-Propyl Glycol (SYSTANE OP) Apply 1 drop to eye as needed (For dryness.).     No current facility-administered medications for this visit.     PHYSICAL EXAMINATION: ECOG PERFORMANCE STATUS: 0 - Asymptomatic  Vitals:   02/02/17 0922  BP: 131/79  Pulse: 66  Resp: 18  Temp: 98 F (36.7 C)   Filed Weights   02/02/17 0922  Weight: 158 lb 3.2 oz (71.8 kg)    GENERAL:alert, no distress and comfortable SKIN: skin color, texture, turgor are normal, no rashes or significant lesions EYES: normal, Conjunctiva are pink and non-injected, sclera clear OROPHARYNX:no exudate, no erythema and lips, buccal mucosa, and tongue normal  NECK: supple, thyroid normal size, non-tender, without nodularity LYMPH:  no palpable lymphadenopathy in the cervical, axillary or inguinal LUNGS: clear to auscultation and percussion with normal breathing effort HEART: regular rate & rhythm and no murmurs and no lower extremity edema ABDOMEN:abdomen soft, non-tender and normal bowel sounds MUSCULOSKELETAL:no cyanosis of digits and no clubbing  NEURO: alert & oriented x 3 with fluent speech, no focal motor/sensory deficits EXTREMITIES: No lower extremity edema BREAST: No palpable masses or nodules in either right or left breasts. No palpable axillary supraclavicular or infraclavicular adenopathy no breast tenderness or nipple discharge. (exam performed in the presence of a chaperone)  LABORATORY DATA:  I have reviewed the data as listed   Chemistry      Component Value Date/Time   NA 141 02/02/2017 0903   K 4.3 02/02/2017 0903   CL 103 02/27/2016 0843   CO2 31 (H) 02/02/2017 0903   BUN 18.9 02/02/2017 0903   CREATININE 0.9 02/02/2017 0903      Component Value Date/Time   CALCIUM 9.7 02/02/2017 0903   ALKPHOS 57 02/02/2017 0903   AST 21 02/02/2017 0903   ALT 17 02/02/2017 0903   BILITOT 0.40 02/02/2017 0903       Lab Results  Component Value Date   WBC 5.2 02/02/2017   HGB 12.4 02/02/2017    HCT 37.0 02/02/2017   MCV 92.9 02/02/2017   PLT 205 02/02/2017   NEUTROABS 3.3 02/02/2017    ASSESSMENT & PLAN:  Breast cancer of lower-outer quadrant of right female breast ([place]) Rt Breast DCIS grade 1, 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 Tolerating anastrozole extremely well.  Severe osteopenia: T score -2.4 based on bone mineral density done October 2016 Currently on Fosamax. In combination with calcium and vitamin D.  Breast cancer surveillance:  1. Mammograms April 2018: normal, breast density category D. 2. Breast Exam:02/02/17: benign  Return to clinic in 1 year for follow-up   I spent 15 minutes talking to the patient of which more than half was spent in counseling and coordination of care.  Orders Placed This Encounter  Procedures  . DG Bone Density    Standing  Status:   Future    Standing Expiration Date:   02/02/2018    Order Specific Question:   Reason for Exam (SYMPTOM  OR DIAGNOSIS REQUIRED)    Answer:   Post menopausal osteopenia    Order Specific Question:   Preferred imaging location?    Answer:   External    Comments:   [facility]   The patient has a good understanding of the overall plan. she agrees with it. she will call with any problems that may develop before the next visit here.   Rulon Eisenmenger, MD 02/02/17

## 2017-02-05 ENCOUNTER — Ambulatory Visit (INDEPENDENT_AMBULATORY_CARE_PROVIDER_SITE_OTHER): Payer: BLUE CROSS/BLUE SHIELD | Admitting: Obstetrics & Gynecology

## 2017-02-05 ENCOUNTER — Encounter: Payer: Self-pay | Admitting: Obstetrics & Gynecology

## 2017-02-05 VITALS — BP 120/76 | Ht 69.5 in | Wt 156.4 lb

## 2017-02-05 DIAGNOSIS — C50511 Malignant neoplasm of lower-outer quadrant of right female breast: Secondary | ICD-10-CM | POA: Diagnosis not present

## 2017-02-05 DIAGNOSIS — N952 Postmenopausal atrophic vaginitis: Secondary | ICD-10-CM | POA: Diagnosis not present

## 2017-02-05 DIAGNOSIS — Z1151 Encounter for screening for human papillomavirus (HPV): Secondary | ICD-10-CM | POA: Diagnosis not present

## 2017-02-05 DIAGNOSIS — Z780 Asymptomatic menopausal state: Secondary | ICD-10-CM

## 2017-02-05 DIAGNOSIS — Z01411 Encounter for gynecological examination (general) (routine) with abnormal findings: Secondary | ICD-10-CM

## 2017-02-05 DIAGNOSIS — Z170 Estrogen receptor positive status [ER+]: Secondary | ICD-10-CM

## 2017-02-05 NOTE — Patient Instructions (Signed)
  1. Encounter for gynecological examination with abnormal finding Gyn exam with Atrophic Vaginitis of Menopause.  Pap/HPV HR done, h/o HPV pos.   2. Menopause present No HRT.  No PMB.  3. Post-menopausal atrophic vaginitis In the context of Breast Ca in Situ, recommend using Astroglide.  4. Malignant neoplasm of lower-outer quadrant of right breast of female, estrogen receptor positive ([place]) Followed by Dr Jana Hakim.  Hester, it was a pleasure to see you today!  I will inform you of your results as soon as available.

## 2017-02-05 NOTE — Progress Notes (Signed)
Crystal [name] 1950-12-26 [id number]   History:    66 y.o. G2P2 married  RP:  Established patient presenting for annual gyn exam   HPI:  Rt Breast Ca in Situ 2016, S/P Lumpectomy/RadioRx.  On Anastrozole.  Mammo benign 09/2016, just seen by Dr Jana Hakim 01/2017.  Menopause.  No HRT.  No PMB.  No pelvic pain.  C/O pain/dryness with IC.   Mictions/BMs wnl.  Past medical history,surgical history, family history and social history were all reviewed and documented in the EPIC chart.  Gynecologic History No LMP recorded. Patient is postmenopausal. Contraception: post menopausal status Last Pap: 09/2014. Results were: Neg/HPV HR neg.  (H/O HPV pos) Last mammogram: 09/2016. Results were: Benign  Obstetric History OB History  Gravida Para Term Preterm AB Living  2 2       2   SAB TAB Ectopic Multiple Live Births               # Outcome Date GA Lbr Len/2nd Weight Sex Delivery Anes PTL Lv  2 Para           1 Para                ROS: A ROS was performed and pertinent positives and negatives are included in the history.  GENERAL: No fevers or chills. HEENT: No change in vision, no earache, sore throat or sinus congestion. NECK: No pain or stiffness. CARDIOVASCULAR: No chest pain or pressure. No palpitations. PULMONARY: No shortness of breath, cough or wheeze. GASTROINTESTINAL: No abdominal pain, nausea, vomiting or diarrhea, melena or bright red blood per rectum. GENITOURINARY: No urinary frequency, urgency, hesitancy or dysuria. MUSCULOSKELETAL: No joint or muscle pain, no back pain, no recent trauma. DERMATOLOGIC: No rash, no itching, no lesions. ENDOCRINE: No polyuria, polydipsia, no heat or cold intolerance. No recent change in weight. HEMATOLOGICAL: No anemia or easy bruising or bleeding. NEUROLOGIC: No headache, seizures, numbness, tingling or weakness. PSYCHIATRIC: No depression, no loss of interest in normal activity or change in sleep pattern.     Exam:   BP 120/76   Ht 5' 9.5"  (1.765 m)   Wt 156 lb 6.4 oz (70.9 kg)   BMI 22.77 kg/m   Body mass index is 22.77 kg/m.  General appearance : Well developed well nourished female. No acute distress HEENT: Eyes: no retinal hemorrhage or exudates,  Neck supple, trachea midline, no carotid bruits, no thyroidmegaly Lungs: Clear to auscultation, no rhonchi or wheezes, or rib retractions  Heart: Regular rate and rhythm, no murmurs or gallops Breast:Examined in sitting and supine position were symmetrical in appearance, no palpable masses or tenderness,  no skin retraction, no nipple inversion, no nipple discharge, no skin discoloration, no axillary or supraclavicular lymphadenopathy Abdomen: no palpable masses or tenderness, no rebound or guarding Extremities: no edema or skin discoloration or tenderness  Pelvic: Vulva normal with mild atrophy  Bartholin, Urethra, Skene Glands: Within normal limits             Vagina: No gross lesions or discharge.  Atrophic Vaginitis.  Cervix: No gross lesions or discharge.  Pap/HPV HR done.  Uterus  AV, normal size, shape and consistency, non-tender and mobile  Adnexa  Without masses or tenderness  Anus and perineum  normal    Assessment/Plan:  66 y.o. female for annual exam   1. Encounter for gynecological examination with abnormal finding Gyn exam with Atrophic Vaginitis of Menopause.  Pap/HPV HR done, h/o HPV pos.   2. Menopause  present No HRT.  No PMB.  3. Post-menopausal atrophic vaginitis In the context of Breast Ca in Situ, recommend using Astroglide.  4. Malignant neoplasm of lower-outer quadrant of right breast of female, estrogen receptor positive ([place]) Followed by Dr Jana Hakim.  Princess Bruins MD, 8:46 AM 02/05/2017

## 2017-02-05 NOTE — Addendum Note (Signed)
Addended by: Thurnell Garbe A on: 02/05/2017 11:45 AM   Modules accepted: Orders

## 2017-02-10 LAB — PAP, TP IMAGING W/ HPV RNA, RFLX HPV TYPE 16,18/45: HPV MRNA, HIGH RISK: NOT DETECTED

## 2017-03-04 ENCOUNTER — Encounter: Payer: Self-pay | Admitting: Nurse Practitioner

## 2017-03-04 ENCOUNTER — Ambulatory Visit (INDEPENDENT_AMBULATORY_CARE_PROVIDER_SITE_OTHER): Payer: BLUE CROSS/BLUE SHIELD | Admitting: Nurse Practitioner

## 2017-03-04 ENCOUNTER — Other Ambulatory Visit (INDEPENDENT_AMBULATORY_CARE_PROVIDER_SITE_OTHER): Payer: BLUE CROSS/BLUE SHIELD

## 2017-03-04 VITALS — BP 124/80 | HR 73 | Temp 98.4°F | Ht 69.5 in | Wt 158.0 lb

## 2017-03-04 DIAGNOSIS — R300 Dysuria: Secondary | ICD-10-CM

## 2017-03-04 DIAGNOSIS — R350 Frequency of micturition: Secondary | ICD-10-CM

## 2017-03-04 LAB — URINALYSIS WITH CULTURE, IF INDICATED
Bilirubin Urine: NEGATIVE
Ketones, ur: NEGATIVE
Nitrite: NEGATIVE
SPECIFIC GRAVITY, URINE: 1.01 (ref 1.000–1.030)
TOTAL PROTEIN, URINE-UPE24: NEGATIVE
URINE GLUCOSE: NEGATIVE
UROBILINOGEN UA: 0.2 (ref 0.0–1.0)
pH: 7.5 (ref 5.0–8.0)

## 2017-03-04 MED ORDER — PHENAZOPYRIDINE HCL 100 MG PO TABS: 100.0000 mg | ORAL_TABLET | Freq: Three times a day (TID) | ORAL | 0 refills | Status: DC | PRN

## 2017-03-04 MED ORDER — NITROFURANTOIN MONOHYD MACRO 100 MG PO CAPS: 100.0000 mg | ORAL_CAPSULE | Freq: Two times a day (BID) | ORAL | 0 refills | Status: AC

## 2017-03-04 NOTE — Patient Instructions (Signed)
Please return urine to lab asap.  Push oral hydration.

## 2017-03-04 NOTE — Progress Notes (Signed)
Subjective:  Patient ID: Crystal [name], female    DOB: 05/09/1951  Age: 66 y.o. MRN: [id number]  CC: Urinary Tract Infection (frequent urinate,burning--pain/ 1 day)   Urinary Frequency   This is a new problem. The current episode started yesterday. The problem occurs every urination. The problem has been gradually worsening. The quality of the pain is described as burning. There has been no fever. She is not sexually active. There is no history of pyelonephritis. Associated symptoms include frequency and urgency. Pertinent negatives include no discharge, flank pain, hematuria, hesitancy, nausea or possible pregnancy. She has tried nothing for the symptoms. Her past medical history is significant for urinary stasis. There is no history of recurrent UTIs.    Outpatient Medications Prior to Visit  Medication Sig Dispense Refill  . alendronate (FOSAMAX) 70 MG tablet take 1 tablet by mouth every week on an empty stomach with A 12 tablet 3  . anastrozole (ARIMIDEX) 1 MG tablet Take 1 tablet (1 mg total) by mouth daily. 90 tablet 3  . Calcium Carbonate-Vitamin D (CALCIUM 600-D) 600-400 MG-UNIT per tablet Take 1 tablet by mouth daily.     . Cholecalciferol (VITAMIN D3) 1000 UNITS CAPS Take by mouth daily.      Marland Kitchen ibuprofen (ADVIL,MOTRIN) 200 MG tablet Take 200-400 mg by mouth daily as needed for headache.    . Inulin (FIBER CHOICE FRUITY BITES PO) Take by mouth.    . Multiple Vitamin (MULTIVITAMIN) tablet Take 1 tablet by mouth daily.      Vladimir Faster Glycol-Propyl Glycol (SYSTANE OP) Apply 1 drop to eye as needed (For dryness.).     No facility-administered medications prior to visit.     ROS See HPI  Objective:  BP 124/80   Pulse 73   Temp 98.4 F (36.9 C)   Ht 5' 9.5" (1.765 m)   Wt 158 lb (71.7 kg)   SpO2 95%   BMI 23.00 kg/m   BP Readings from Last 3 Encounters:  03/04/17 124/80  02/05/17 120/76  02/02/17 131/79    Wt Readings from Last 3 Encounters:  03/04/17 158 lb  (71.7 kg)  02/05/17 156 lb 6.4 oz (70.9 kg)  02/02/17 158 lb 3.2 oz (71.8 kg)    Physical Exam  Constitutional: She is oriented to person, place, and time. No distress.  Cardiovascular: Normal rate.   Pulmonary/Chest: Effort normal.  Abdominal: Soft. She exhibits no distension. There is tenderness.  Suprapubic tenderness. Negative CVA tenderness  Neurological: She is alert and oriented to person, place, and time.  Skin: Skin is warm and dry.  Vitals reviewed.   Lab Results  Component Value Date   WBC 5.2 02/02/2017   HGB 12.4 02/02/2017   HCT 37.0 02/02/2017   PLT 205 02/02/2017   GLUCOSE 71 02/02/2017   CHOL 200 02/27/2016   TRIG 57.0 02/27/2016   HDL 84.20 02/27/2016   LDLCALC 104 (H) 02/27/2016   ALT 17 02/02/2017   AST 21 02/02/2017   NA 141 02/02/2017   K 4.3 02/02/2017   CL 103 02/27/2016   CREATININE 0.9 02/02/2017   BUN 18.9 02/02/2017   CO2 31 (H) 02/02/2017   TSH 1.81 02/27/2016    Dg Lumbar Spine Complete  Result Date: 12/17/2016 CLINICAL DATA:  Chronic numbness of toes without known injury. EXAM: LUMBAR SPINE - COMPLETE 4+ VIEW COMPARISON:  Radiographs of June 25, 2010. FINDINGS: No fracture or spondylolisthesis is noted. Moderate degenerative disc disease is noted at L5-S1. Remaining disc spaces  appear intact. Posterior facet joints are unremarkable. IMPRESSION: Moderate degenerative disc disease is noted at L5-S1. No acute abnormality seen in the lumbar spine. Electronically Signed   By: Marijo Conception, M.D.   On: 12/17/2016 09:54   Urinalysis    Component Value Date/Time   COLORURINE YELLOW 03/04/2017 [place] 03/04/2017 1112   LABSPEC 1.010 03/04/2017 1112   PHURINE 7.5 03/04/2017 1112   GLUCOSEU NEGATIVE 03/04/2017 1112   HGBUR TRACE-INTACT (A) 03/04/2017 1112   BILIRUBINUR NEGATIVE 03/04/2017 1112   BILIRUBINUR neg 05/28/2016 0928   KETONESUR NEGATIVE 03/04/2017 1112   PROTEINUR neg 05/28/2016 0928   UROBILINOGEN 0.2  03/04/2017 1112   NITRITE NEGATIVE 03/04/2017 1112   LEUKOCYTESUR SMALL (A) 03/04/2017 1112    Assessment & Plan:   Crystal [name] was seen today for urinary tract infection.  Diagnoses and all orders for this visit:  Frequent urination -     Cancel: POCT urinalysis dipstick -     phenazopyridine (PYRIDIUM) 100 MG tablet; Take 1 tablet (100 mg total) by mouth 3 (three) times daily as needed for pain (with food). -     Urinalysis with Culture, if indicated; Future -     nitrofurantoin, macrocrystal-monohydrate, (MACROBID) 100 MG capsule; Take 1 capsule (100 mg total) by mouth 2 (two) times daily.  Dysuria -     phenazopyridine (PYRIDIUM) 100 MG tablet; Take 1 tablet (100 mg total) by mouth 3 (three) times daily as needed for pain (with food). -     Urinalysis with Culture, if indicated; Future -     nitrofurantoin, macrocrystal-monohydrate, (MACROBID) 100 MG capsule; Take 1 capsule (100 mg total) by mouth 2 (two) times daily.   I am having Crystal [name] start on phenazopyridine and nitrofurantoin (macrocrystal-monohydrate). I am also having her maintain her Calcium Carbonate-Vitamin D, multivitamin, Vitamin D3, Inulin (FIBER CHOICE FRUITY BITES PO), ibuprofen, Polyethyl Glycol-Propyl Glycol (SYSTANE OP), anastrozole, and alendronate.  Meds ordered this encounter  Medications  . phenazopyridine (PYRIDIUM) 100 MG tablet    Sig: Take 1 tablet (100 mg total) by mouth 3 (three) times daily as needed for pain (with food).    Dispense:  10 tablet    Refill:  0    Order Specific Question:   Supervising Provider    Answer:   Cassandria Anger [[id number]]  . nitrofurantoin, macrocrystal-monohydrate, (MACROBID) 100 MG capsule    Sig: Take 1 capsule (100 mg total) by mouth 2 (two) times daily.    Dispense:  10 capsule    Refill:  0    Order Specific Question:   Supervising Provider    Answer:   Cassandria Anger [[id number]]    Follow-up: Return if symptoms worsen or fail to improve.  Wilfred Lacy, NP

## 2017-03-11 ENCOUNTER — Telehealth: Payer: Self-pay | Admitting: Internal Medicine

## 2017-03-11 DIAGNOSIS — R8290 Unspecified abnormal findings in urine: Secondary | ICD-10-CM

## 2017-03-11 NOTE — Telephone Encounter (Signed)
Pt called checking on the results from her urine culture. Please advise.

## 2017-03-12 NOTE — Telephone Encounter (Signed)
Checking on the urine culture result. Spoke with Caren Griffins, she is looking into it. Waiting for her to call me or Jonelle Sidle back.

## 2017-03-12 NOTE — Telephone Encounter (Signed)
Pt called asking about this, she would like results from her culture, please call when you have them.

## 2017-03-12 NOTE — Telephone Encounter (Signed)
Pt is aware and she will go to the lab one day next week to give sample. Order entered

## 2017-03-12 NOTE — Addendum Note (Signed)
Addended byShawnie Pons on: 03/12/2017 03:19 PM   Modules accepted: Orders

## 2017-03-12 NOTE — Telephone Encounter (Signed)
Per cynthia, urine for culture cannot be tracked down--not sure what happened---cynthia has talked with her staff and has submitted safety zone portal---charlotte advised

## 2017-03-12 NOTE — Telephone Encounter (Signed)
Spoke with pt, report no symptoms right now but she said last year she was treated with the abx but it came back 2 weeks later because they didn't do the urine culture.   Advise pt to give our office a call back if she has any symptoms. FYI

## 2017-03-12 NOTE — Telephone Encounter (Signed)
Please have patient return to lab to provide another urine sample for urine culture. Please enter order. Thank you

## 2017-03-20 ENCOUNTER — Ambulatory Visit (INDEPENDENT_AMBULATORY_CARE_PROVIDER_SITE_OTHER): Payer: BLUE CROSS/BLUE SHIELD | Admitting: Neurology

## 2017-03-20 ENCOUNTER — Encounter: Payer: Self-pay | Admitting: Neurology

## 2017-03-20 VITALS — BP 120/70 | HR 67 | Ht 69.5 in | Wt 156.2 lb

## 2017-03-20 DIAGNOSIS — G629 Polyneuropathy, unspecified: Secondary | ICD-10-CM | POA: Diagnosis not present

## 2017-03-20 NOTE — Patient Instructions (Addendum)
It was great to see you today.  Your exam shows mild symptoms of peripheral neuropathy.  We check labs for treatable causes of neuropathy and call you with the results.  If your symptoms get worse or you decide to proceed with the nerve test, please call my office

## 2017-03-20 NOTE — Progress Notes (Signed)
[facility] Clinic Note - Initial Visit   Date: 03/20/17  Crystal [name] MRN: [id number] DOB: 1951/05/05   Dear Dr. Sharlet Salina:  Thank you for your kind referral of AIJALON [name] for consultation of numbness of the toes. Although her history is well known to you, please allow [name] to reiterate it for the purpose of our medical record. The patient was accompanied to the clinic by self.  History of Present Illness: Crystal [name] is a 66 y.o. right-handed Caucasian female with right breast cancer s/p radiation (2016),  presenting for evaluation of bilateral toe numbness/tingling.    Starting around 2011, she started having tingling of the tips of her toes.  She saw Dr. Linda Hedges and was told she may have early signs of neuropathy.  During the summer of 2018, she starting having burning sensation over the toes and the pad of the feet.   She recalls having to take her shoes off her feet because of severe pain.  Lately, she does not have severe pain, but continues to have tingling and numbness of the toes, which is worse on the left.  She denies any imbalance, falls, or weakness.   She denies any history of diabetes mellitus, alcohol abuse, or family history neuropathy.   Out-side paper records, electronic medical record, and images have been reviewed where available and summarized as:  Lab Results  Component Value Date   TSH 1.81 02/27/2016   Lab Results  Component Value Date   OZHYQMVH84 696 02/27/2016     Past Medical History:  Diagnosis Date  . BACK PAIN, LUMBAR   . Breast cancer, right breast   . FIBROADENOMA, BREAST 2006  . PARESTHESIA    toes  . PONV (postoperative nausea and vomiting)   . Radiation 11/23/14-12/25/14   right breast 42.72 gray, lumpectomy cavity boosted to 54.72 gray  . Wears glasses     Past Surgical History:  Procedure Laterality Date  . atrophic vaginitis    . BREAST LUMPECTOMY WITH RADIOACTIVE SEED LOCALIZATION Right  10/18/2014   Procedure: BREAST LUMPECTOMY WITH RADIOACTIVE SEED LOCALIZATION;  Surgeon: Coralie Keens, MD;  Location: [facility];  Service: General;  Laterality: Right;  . COLONOSCOPY  09/05/2013  . D & C and hysteroscopy  11/1998   for uterine polyp  . DILATATION & CURRETTAGE/HYSTEROSCOPY WITH RESECTOCOPE N/A 05/30/2015   Procedure: DILATATION & CURETTAGE/HYSTEROSCOPY WITH RESECTOCOPE;  Surgeon: Princess Bruins, MD;  Location: [facility];  Service: Gynecology;  Laterality: N/A;  . DILATION AND CURETTAGE OF UTERUS    . Sterotactix needle biopsy  2000   breast calcification     Medications:  Outpatient Encounter Prescriptions as of 03/20/2017  Medication Sig  . alendronate (FOSAMAX) 70 MG tablet take 1 tablet by mouth every week on an empty stomach with A  . anastrozole (ARIMIDEX) 1 MG tablet Take 1 tablet (1 mg total) by mouth daily.  . Calcium Carbonate-Vitamin D (CALCIUM 600-D) 600-400 MG-UNIT per tablet Take 1 tablet by mouth daily.   . Cholecalciferol (VITAMIN D3) 1000 UNITS CAPS Take by mouth daily.    Marland Kitchen ibuprofen (ADVIL,MOTRIN) 200 MG tablet Take 200-400 mg by mouth daily as needed for headache.  . Inulin (FIBER CHOICE FRUITY BITES PO) Take by mouth.  . Multiple Vitamin (MULTIVITAMIN) tablet Take 1 tablet by mouth daily.    Vladimir Faster Glycol-Propyl Glycol (SYSTANE OP) Apply 1 drop to eye as needed (For dryness.).  . [DISCONTINUED] phenazopyridine (PYRIDIUM) 100 MG tablet Take 1  tablet (100 mg total) by mouth 3 (three) times daily as needed for pain (with food).   No facility-administered encounter medications on file as of 03/20/2017.      Allergies: No Known Allergies  Family History: Family History  Problem Relation Age of Onset  . Hypothyroidism Mother   . GER disease Mother   . Osteoporosis Mother   . Hypertension Mother   . Cancer Mother        hodgkins lympoma, uterine cancer  . Arrhythmia Mother   . Hyperlipidemia Father   . Hypertension Father   .  Diabetes Father   . Coronary artery disease Father   . Coronary artery disease Maternal Grandfather   . COPD Neg Hx   . Asthma Neg Hx   . Colon cancer Neg Hx   . Esophageal cancer Neg Hx   . Rectal cancer Neg Hx   . Stomach cancer Neg Hx     Social History: Social History  Substance Use Topics  . Smoking status: Never Smoker  . Smokeless tobacco: Never Used  . Alcohol use 2.5 oz/week    5 Standard drinks or equivalent per week     Comment: 2 beers/week   Social History   Social History Narrative   HSG, [place], Married '78,  1 son '81, 1 daughter '84, 1 grandson '12.    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 ACP - discussed with patient.    Refer - [email or web address]   Highest level of education:  BS    Review of Systems:  CONSTITUTIONAL: No fevers, chills, night sweats, or weight loss.   EYES: No visual changes or eye pain ENT: No hearing changes.  No history of nose bleeds.   RESPIRATORY: No cough, wheezing and shortness of breath.   CARDIOVASCULAR: Negative for chest pain, and palpitations.   GI: Negative for abdominal discomfort, blood in stools or black stools.  No recent change in bowel habits.   GU:  No history of incontinence.   MUSCLOSKELETAL: No history of joint pain or swelling.  No myalgias.   SKIN: Negative for lesions, rash, and itching.   HEMATOLOGY/ONCOLOGY: Negative for prolonged bleeding, bruising easily, and swollen nodes.  +history of cancer.   ENDOCRINE: Negative for cold or heat intolerance, polydipsia or goiter.   PSYCH:  No depression or anxiety symptoms.   NEURO: As Above.   Vital Signs:  BP 120/70   Pulse 67   Ht 5' 9.5" (1.765 m)   Wt 156 lb 4 oz (70.9 kg)   SpO2 98%   BMI 22.74 kg/m   General Medical Exam:   General:  Well appearing, comfortable.   Eyes/ENT: see cranial nerve examination.   Neck: No masses appreciated.  Full range of motion without tenderness.  No carotid  bruits. Respiratory:  Clear to auscultation, good air entry bilaterally.   Cardiac:  Regular rate and rhythm, no murmur.   Extremities:  No deformities, edema, or skin discoloration.  Skin:  No rashes or lesions.  Neurological Exam: MENTAL STATUS including orientation to time, place, person, recent and remote memory, attention span and concentration, language, and fund of knowledge is normal.  Speech is not dysarthric.  CRANIAL NERVES: II:  No visual field defects.  Unremarkable fundi.   III-IV-VI: Pupils equal round and reactive to light.  Normal conjugate, extra-ocular eye movements in all directions of gaze.  No nystagmus.  No ptosis.   V:  Normal facial sensation.  VII:  Normal facial symmetry and movements.  No pathologic facial reflexes.  VIII:  Normal hearing and vestibular function.   IX-X:  Normal palatal movement.   XI:  Normal shoulder shrug and head rotation.   XII:  Normal tongue strength and range of motion, no deviation or fasciculation.  MOTOR:  No atrophy, fasciculations or abnormal movements.  No pronator drift.  Tone is normal.    Right Upper Extremity:    Left Upper Extremity:    Deltoid  5/5   Deltoid  5/5   Biceps  5/5   Biceps  5/5   Triceps  5/5   Triceps  5/5   Wrist extensors  5/5   Wrist extensors  5/5   Wrist flexors  5/5   Wrist flexors  5/5   Finger extensors  5/5   Finger extensors  5/5   Finger flexors  5/5   Finger flexors  5/5   Dorsal interossei  5/5   Dorsal interossei  5/5   Abductor pollicis  5/5   Abductor pollicis  5/5   Tone (Ashworth scale)  0  Tone (Ashworth scale)  0   Right Lower Extremity:    Left Lower Extremity:    Hip flexors  5/5   Hip flexors  5/5   Hip extensors  5/5   Hip extensors  5/5   Knee flexors  5/5   Knee flexors  5/5   Knee extensors  5/5   Knee extensors  5/5   Dorsiflexors  5/5   Dorsiflexors  5/5   Plantarflexors  5/5   Plantarflexors  5/5   Toe extensors  5/5   Toe extensors  5/5   Toe flexors  5/5   Toe  flexors  5/5   Tone (Ashworth scale)  0  Tone (Ashworth scale)  0   MSRs:  Right                                                                 Left brachioradialis 2+  brachioradialis 2+  biceps 2+  biceps 2+  triceps 2+  triceps 2+  patellar 2+  patellar 2+  ankle jerk 2+  ankle jerk 2+  Hoffman no  Hoffman no  plantar response down  plantar response down   SENSORY:  Normal and symmetric perception of light touch, pinprick, vibration, and proprioception.  Romberg's sign absent.   COORDINATION/GAIT: Normal finger-to- nose-finger and heel-to-shin.  Intact rapid alternating movements bilaterally.  Able to rise from a chair without using arms.  Gait narrow based and stable. Tandem and stressed gait intact.    IMPRESSION: Mrs. Knopf is a 66 year-old female referred for evaluation of bilateral toe paresthesias.  His neurological examination is remarkably intact.  However, based on her history, I do suspect that she has early and distal signs of peripheral neuropathy. I counseled the patient regarding the pathogenesis, etiology, management, and natural course of neuropathy. Neuropathy tends to be slowly progressive, especially if a treatable etiology is not identified.  I would like to test for treatable causes of neuropathy. I discussed that in the vast majority of cases, despite checking for reversible causes, we are unable to find the underlying etiology and management is symptomatic.  We discussed further testing with NCS/EMG  of the legs, but also to keep in mind possibility of false positive results given the very distal symptoms.  We decided to puruse NCS/EMG only if her symptoms get worse.  She does not have tingling, burning, or painful paresthesias so there is no role for medications.  I offered OTC lidocaine ointments, if she did develop any.  Unfortunately, there is nothing available to manage her numbness.    PLAN/RECOMMENDATIONS:  1.  Check ESR, vitamin B12, vitamin B1, copper, SPEP  with IFE, glucose tolerance testing 2.  Try OTC lidocaine ointment, if she develops painful paresthesias 3.  NCS/EMG of the legs, if symptoms get worse  Return to clinic as needed  The duration of this appointment visit was 45 minutes of face-to-face time with the patient.  Greater than 50% of this time was spent in counseling, explanation of diagnosis, planning of further management, and coordination of care.   Thank you for allowing me to participate in patient's care.  If I can answer any additional questions, I would be pleased to do so.    Sincerely,    Donika K. Posey Pronto, DO'

## 2017-03-26 ENCOUNTER — Other Ambulatory Visit: Payer: BLUE CROSS/BLUE SHIELD

## 2017-03-26 DIAGNOSIS — N3000 Acute cystitis without hematuria: Secondary | ICD-10-CM

## 2017-03-29 LAB — URINE CULTURE
MICRO NUMBER:: 81073106
SPECIMEN QUALITY:: ADEQUATE

## 2017-03-30 ENCOUNTER — Telehealth: Payer: Self-pay | Admitting: Emergency Medicine

## 2017-03-30 MED ORDER — CIPROFLOXACIN HCL 500 MG PO TABS: 500.0000 mg | ORAL_TABLET | Freq: Two times a day (BID) | ORAL | 0 refills | Status: DC

## 2017-03-30 NOTE — Telephone Encounter (Signed)
Pt called and asked that you give her a call back. She has some questions about the medication that you called in for her. Thanks.

## 2017-03-30 NOTE — Telephone Encounter (Signed)
Spoke with pt

## 2017-03-31 MED ORDER — AMOXICILLIN-POT CLAVULANATE 875-125 MG PO TABS: 1.0000 | ORAL_TABLET | Freq: Two times a day (BID) | ORAL | 0 refills | Status: DC

## 2017-03-31 NOTE — Addendum Note (Signed)
Addended byShawnie Pons on: 03/31/2017 11:25 AM   Modules accepted: Orders

## 2017-03-31 NOTE — Telephone Encounter (Signed)
rx sent, pt is aware 

## 2017-04-03 ENCOUNTER — Other Ambulatory Visit: Payer: BLUE CROSS/BLUE SHIELD

## 2017-04-03 DIAGNOSIS — G629 Polyneuropathy, unspecified: Secondary | ICD-10-CM

## 2017-04-03 LAB — GLUCOSE TOLERANCE, 2 HOURS
GLUCOSE, FASTING: 108 mg/dL — AB (ref 70–99)
Glucose, 1 Hour GTT: 117 mg/dL
Glucose, 2 hour: 104 mg/dL

## 2017-04-03 LAB — VITAMIN B12: VITAMIN B 12: 597 pg/mL (ref 211–911)

## 2017-04-03 LAB — TIQ-NTM

## 2017-04-03 LAB — SEDIMENTATION RATE: Sed Rate: 41 mm/hr — ABNORMAL HIGH (ref 0–30)

## 2017-04-08 LAB — IMMUNOFIXATION ELECTROPHORESIS
IgG (Immunoglobin G), Serum: 1059 mg/dL (ref 694–1618)
IgM, Serum: 123 mg/dL (ref 48–271)
Immunofix Electr Int: NOT DETECTED
Immunoglobulin A: 251 mg/dL (ref 81–463)

## 2017-04-08 LAB — COPPER, SERUM

## 2017-04-08 LAB — PROTEIN ELECTROPHORESIS, SERUM
ALBUMIN ELP: 3.7 g/dL — AB (ref 3.8–4.8)
ALPHA 1: 0.4 g/dL — AB (ref 0.2–0.3)
ALPHA 2: 0.9 g/dL (ref 0.5–0.9)
BETA 2: 0.3 g/dL (ref 0.2–0.5)
Beta Globulin: 0.4 g/dL (ref 0.4–0.6)
GAMMA GLOBULIN: 1 g/dL (ref 0.8–1.7)
Total Protein: 6.7 g/dL (ref 6.1–8.1)

## 2017-04-08 LAB — VITAMIN B1: Vitamin B1 (Thiamine): 20 nmol/L (ref 8–30)

## 2017-04-09 ENCOUNTER — Other Ambulatory Visit: Payer: Self-pay | Admitting: *Deleted

## 2017-04-09 ENCOUNTER — Telehealth: Payer: Self-pay | Admitting: *Deleted

## 2017-04-09 DIAGNOSIS — G629 Polyneuropathy, unspecified: Secondary | ICD-10-CM

## 2017-04-09 NOTE — Telephone Encounter (Signed)
-----   Message from Alda Berthold, DO sent at 04/09/2017 10:41 AM EDT ----- Please inform patient that her labs showed very mild elevation and fasting glucose and I recommend that she start a low sugar/local carbohydrate diet to minimize progression into diabetes, as this can contribute to neuropathy.. The remaining labs are within normal limits.  The results for her copper did not come through and may be lab handling error, if she would like to have this redrawn, please send a new order.

## 2017-04-09 NOTE — Telephone Encounter (Signed)
Patient given results and instructions.  She will come in on Monday for copper lab.

## 2017-04-13 ENCOUNTER — Other Ambulatory Visit: Payer: BLUE CROSS/BLUE SHIELD

## 2017-04-13 DIAGNOSIS — G629 Polyneuropathy, unspecified: Secondary | ICD-10-CM

## 2017-04-15 LAB — COPPER, SERUM: COPPER: 132 ug/dL (ref 70–175)

## 2017-05-25 LAB — HM DEXA SCAN: HM Dexa Scan: -2.4

## 2017-06-01 ENCOUNTER — Encounter: Payer: Self-pay | Admitting: Anesthesiology

## 2017-06-01 ENCOUNTER — Encounter: Payer: Self-pay | Admitting: Internal Medicine

## 2017-06-01 NOTE — Progress Notes (Signed)
Abstracted and sent to scan  

## 2017-06-02 ENCOUNTER — Ambulatory Visit (INDEPENDENT_AMBULATORY_CARE_PROVIDER_SITE_OTHER): Payer: BLUE CROSS/BLUE SHIELD

## 2017-06-02 DIAGNOSIS — Z23 Encounter for immunization: Secondary | ICD-10-CM

## 2017-06-11 ENCOUNTER — Encounter: Payer: Self-pay | Admitting: Internal Medicine

## 2017-06-11 ENCOUNTER — Ambulatory Visit: Payer: BLUE CROSS/BLUE SHIELD | Admitting: Internal Medicine

## 2017-06-11 DIAGNOSIS — B349 Viral infection, unspecified: Secondary | ICD-10-CM

## 2017-06-11 MED ORDER — PREDNISONE 20 MG PO TABS: 40.0000 mg | ORAL_TABLET | Freq: Every day | ORAL | 0 refills | Status: DC

## 2017-06-11 NOTE — Progress Notes (Signed)
   Subjective:    Patient ID: Crystal [name], female    DOB: 22-Aug-1950, 66 y.o.   MRN: [id number]  HPI The patient is a 66 YO female coming in for drainage for about 1.5 weeks. She is having some cough and headache. Denies fevers or chills. Denies SOB. Overall stable since onset perhaps worsening slightly. She is taking sudafed over the counter which helps while it is working. Cough is mostly non-productive.   Review of Systems  Constitutional: Negative for activity change, appetite change, chills, fatigue, fever and unexpected weight change.  HENT: Positive for congestion, postnasal drip, rhinorrhea and sinus pressure. Negative for ear discharge, ear pain, sinus pain, sneezing, sore throat, tinnitus, trouble swallowing and voice change.   Eyes: Negative.   Respiratory: Positive for cough. Negative for chest tightness, shortness of breath and wheezing.   Cardiovascular: Negative.   Gastrointestinal: Negative.   Musculoskeletal: Positive for myalgias.  Neurological: Negative.       Objective:   Physical Exam  Constitutional: She is oriented to person, place, and time. She appears well-developed and well-nourished.  HENT:  Head: Normocephalic and atraumatic.  Oropharynx with redness and clear drainage, nose with swollen turbinates, TMs normal bilaterally, right ear canal blocked with wax prior to clearing.   Eyes: EOM are normal.  Neck: Normal range of motion. No thyromegaly present.  Cardiovascular: Normal rate and regular rhythm.  Pulmonary/Chest: Effort normal and breath sounds normal. No respiratory distress. She has no wheezes. She has no rales.  Abdominal: Soft.  Lymphadenopathy:    She has no cervical adenopathy.  Neurological: She is alert and oriented to person, place, and time.  Skin: Skin is warm and dry.   Vitals:   06/11/17 1111  BP: 110/80  Pulse: 87  Temp: 98.1 F (36.7 C)  TempSrc: Oral  SpO2: 99%  Weight: 160 lb (72.6 kg)  Height: 5' 9.5" (1.765 m)        Assessment & Plan:

## 2017-06-11 NOTE — Patient Instructions (Signed)
We have sent in the prednisone to take 2 pills daily.  Start taking zyrtec (cetirizine) 10 mg daily.   Use the sudafed only if you need it.

## 2017-06-11 NOTE — Assessment & Plan Note (Addendum)
Advised to use zyrtec and rx for prednisone. No indication for antibiotics today. Right ear cleared of wax in office today.

## 2017-06-16 ENCOUNTER — Ambulatory Visit: Payer: BLUE CROSS/BLUE SHIELD | Admitting: Internal Medicine

## 2017-06-16 ENCOUNTER — Telehealth: Payer: Self-pay | Admitting: Internal Medicine

## 2017-06-16 MED ORDER — AMOXICILLIN-POT CLAVULANATE 875-125 MG PO TABS: 1.0000 | ORAL_TABLET | Freq: Two times a day (BID) | ORAL | 0 refills | Status: DC

## 2017-06-16 NOTE — Telephone Encounter (Signed)
Please call and find out if she has taken prednisone and if she started zyrtec daily.   >> Jun 16, 2017  2:20 PM [name], NT wrote: Pt. Called back wanting to speak with Crystal [name] but she was at lunch. Pt said she has the same symptoms dry cough and drainage. Pt. Asked if something can be called in. Pt. Uses [facility] in [place] line.Marland Kitchen

## 2017-06-16 NOTE — Telephone Encounter (Signed)
Sent in augmentin to take 1 pill twice a day for 10 days.

## 2017-06-16 NOTE — Telephone Encounter (Signed)
States that she has finished the prednisone yesterday and is still taking zyrtec and sudafed. States that her head hurts really bad when she coughs and is having more nasal pressure and head pressure. Drainage down her throat causing her to cough has tried mucinex to help and has not helped

## 2017-06-16 NOTE — Telephone Encounter (Signed)
Patient informed Rx was sent in

## 2017-08-06 IMAGING — DX DG LUMBAR SPINE COMPLETE 4+V
5 series · 5 of 5 positions shown · non-contrast
Comparison: Radiographs June 25, 2010.

CLINICAL DATA: Chronic numbness of toes without known injury.

EXAM:
LUMBAR SPINE - COMPLETE 4+ VIEW

[l-spine ap]
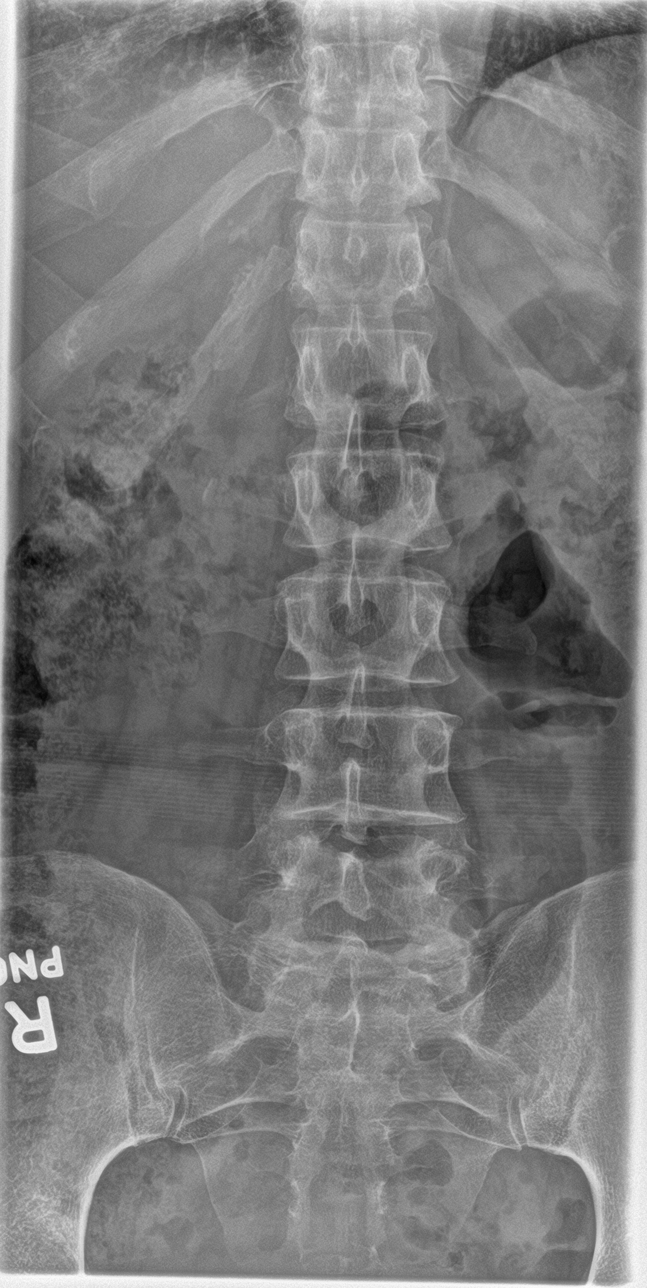

[l-spine obl (1 of 2)]
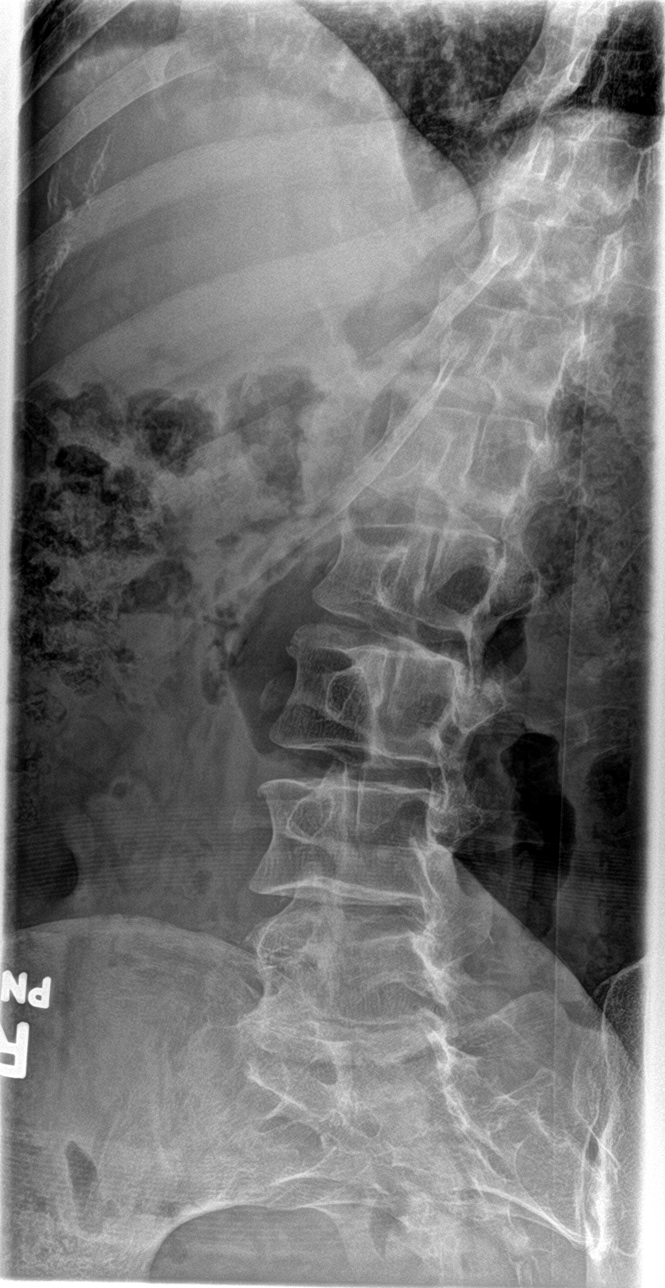

[l-spine obl (2 of 2)]
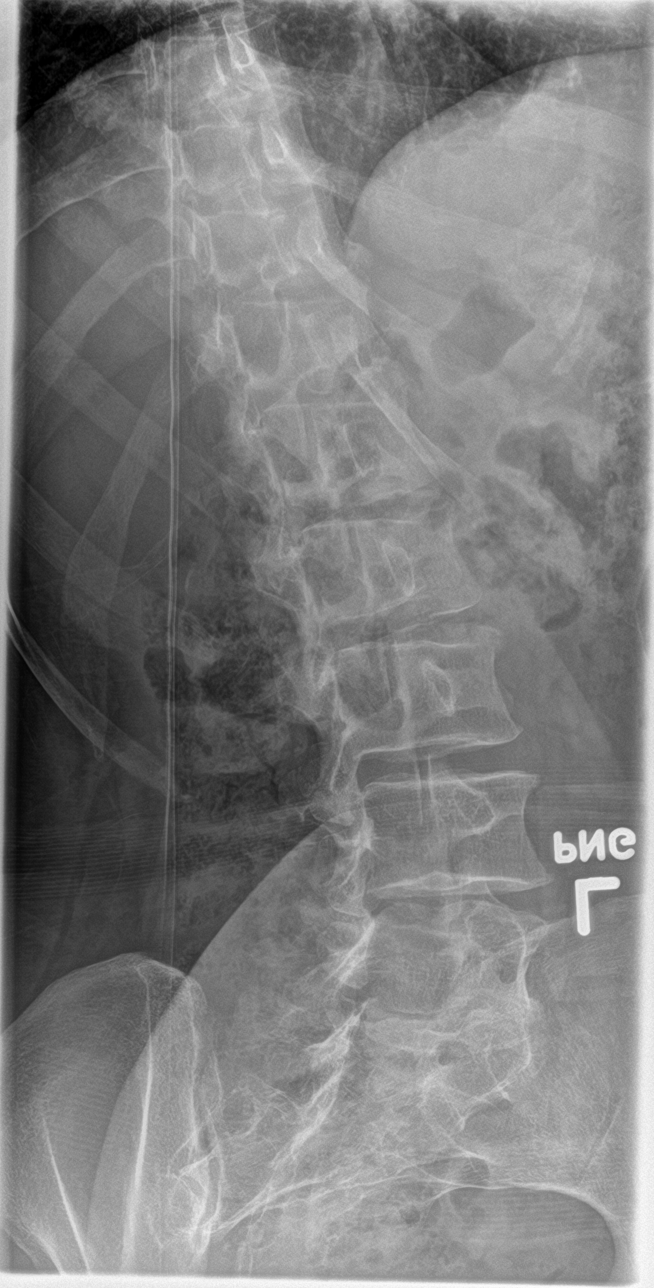

[l-spine lat]
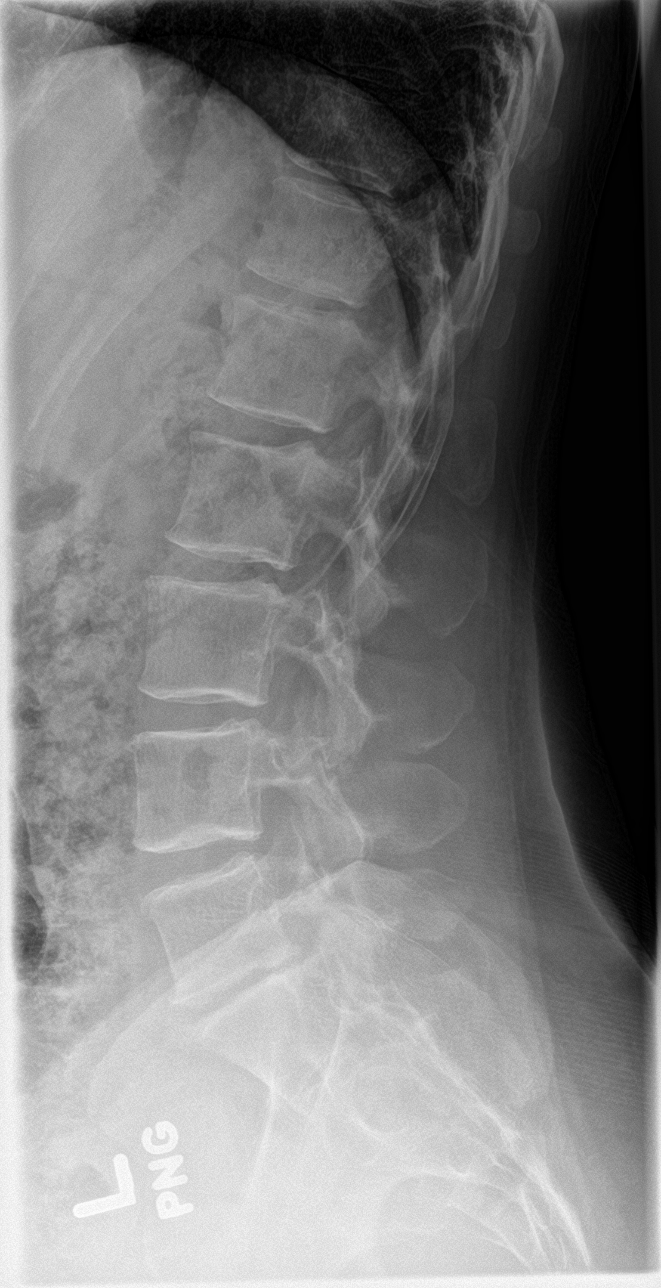

[l-spine spot]
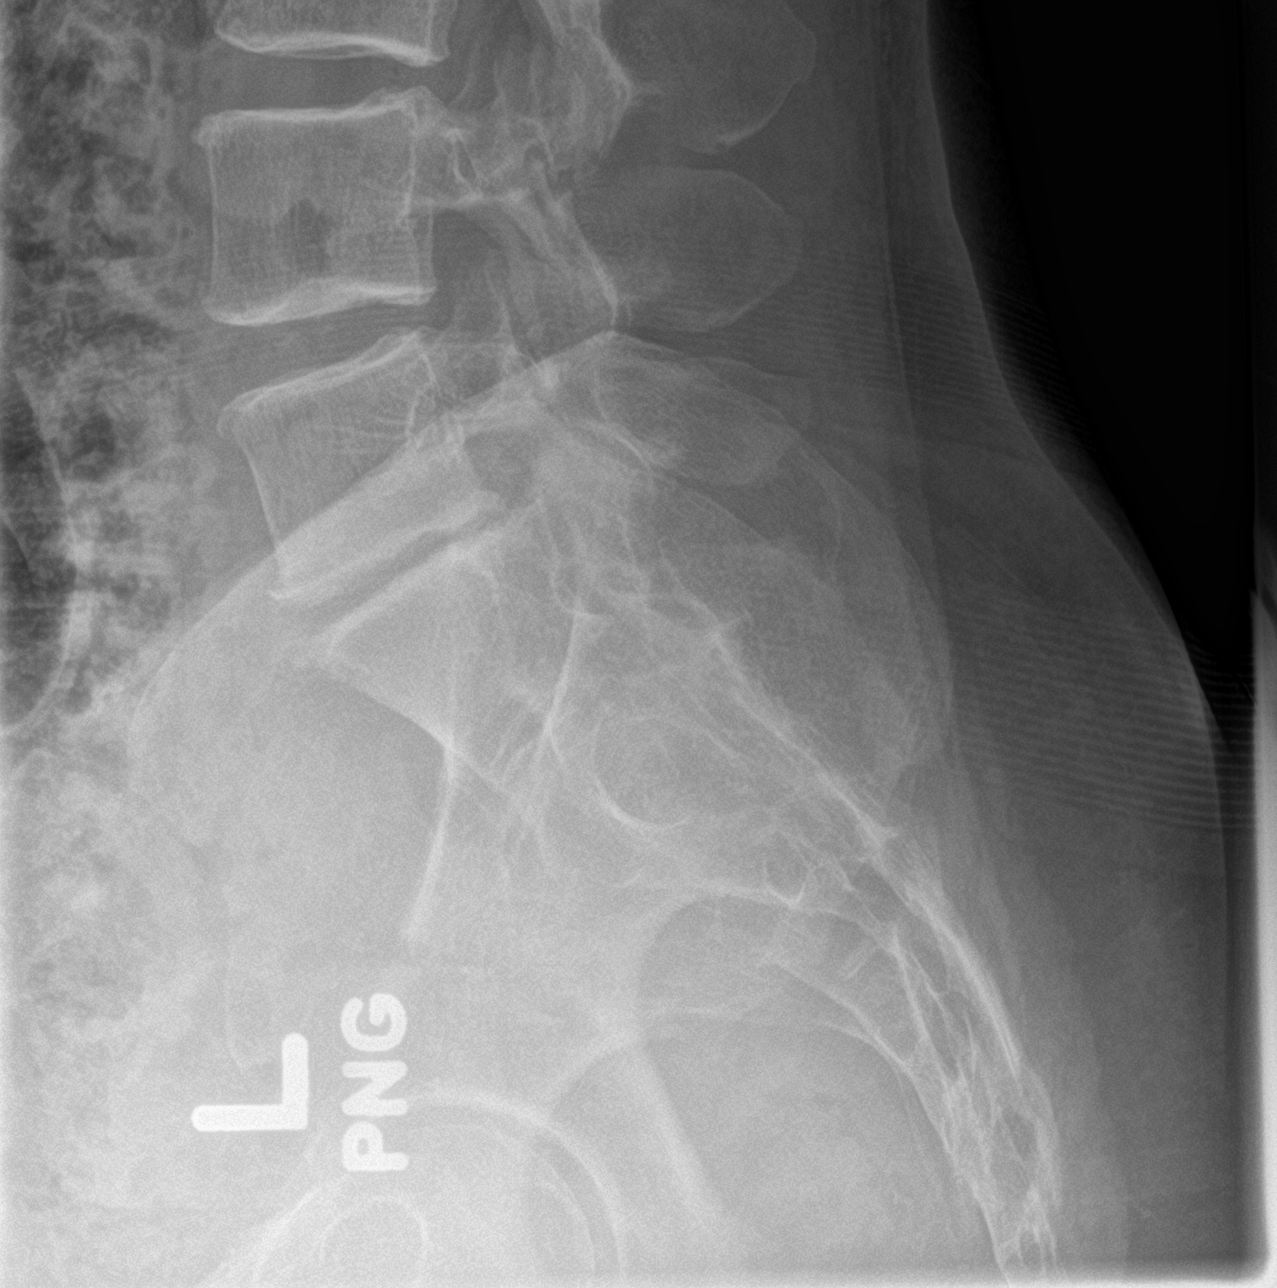

[5 of 5 positions shown; findings below may reference images not displayed]

FINDINGS: No fracture or spondylolisthesis is noted. Moderate degenerative
disc disease is noted at L5-S1. Remaining disc spaces appear intact.
Posterior facet joints are unremarkable.
IMPRESSION: Moderate degenerative disc disease is noted at L5-S1. No acute
abnormality seen in the lumbar spine.

## 2017-09-14 ENCOUNTER — Encounter (HOSPITAL_COMMUNITY): Payer: Self-pay

## 2017-09-14 ENCOUNTER — Emergency Department (HOSPITAL_COMMUNITY)
Admission: EM | Admit: 2017-09-14 | Discharge: 2017-09-14 | Disposition: A | Discharge status: Home / Self Care | Payer: BLUE CROSS/BLUE SHIELD | Attending: Emergency Medicine | Admitting: Emergency Medicine

## 2017-09-14 DIAGNOSIS — R55 Syncope and collapse: Secondary | ICD-10-CM

## 2017-09-14 DIAGNOSIS — E860 Dehydration: Secondary | ICD-10-CM | POA: Insufficient documentation

## 2017-09-14 DIAGNOSIS — Z79899 Other long term (current) drug therapy: Secondary | ICD-10-CM | POA: Diagnosis not present

## 2017-09-14 DIAGNOSIS — Z853 Personal history of malignant neoplasm of breast: Secondary | ICD-10-CM | POA: Diagnosis not present

## 2017-09-14 DIAGNOSIS — K529 Noninfective gastroenteritis and colitis, unspecified: Secondary | ICD-10-CM

## 2017-09-14 LAB — CBC
HEMATOCRIT: 38.9 % (ref 36.0–46.0)
Hemoglobin: 12.8 g/dL (ref 12.0–15.0)
MCH: 30.5 pg (ref 26.0–34.0)
MCHC: 32.9 g/dL (ref 30.0–36.0)
MCV: 92.6 fL (ref 78.0–100.0)
PLATELETS: 229 10*3/uL (ref 150–400)
RBC: 4.2 MIL/uL (ref 3.87–5.11)
RDW: 12.9 % (ref 11.5–15.5)
WBC: 14.1 10*3/uL — AB (ref 4.0–10.5)

## 2017-09-14 LAB — COMPREHENSIVE METABOLIC PANEL
ALBUMIN: 4.2 g/dL (ref 3.5–5.0)
ALT: 30 U/L (ref 14–54)
ANION GAP: 10 (ref 5–15)
AST: 43 U/L — AB (ref 15–41)
Alkaline Phosphatase: 56 U/L (ref 38–126)
BUN: 15 mg/dL (ref 6–20)
CO2: 25 mmol/L (ref 22–32)
Calcium: 9.2 mg/dL (ref 8.9–10.3)
Chloride: 102 mmol/L (ref 101–111)
Creatinine, Ser: 1.05 mg/dL — ABNORMAL HIGH (ref 0.44–1.00)
GFR calc Af Amer: >60 mL/min (ref >60)
GFR calc non Af Amer: 55 mL/min — ABNORMAL LOW (ref >60)
GLUCOSE: 122 mg/dL — AB (ref 65–99)
POTASSIUM: 4.2 mmol/L (ref 3.5–5.1)
SODIUM: 137 mmol/L (ref 135–145)
Total Bilirubin: 0.6 mg/dL (ref 0.3–1.2)
Total Protein: 7.5 g/dL (ref 6.5–8.1)

## 2017-09-14 LAB — URINALYSIS, ROUTINE W REFLEX MICROSCOPIC
BACTERIA UA: NONE SEEN
Bilirubin Urine: NEGATIVE
Glucose, UA: NEGATIVE mg/dL
Ketones, ur: 20 mg/dL — AB
LEUKOCYTES UA: NEGATIVE
NITRITE: NEGATIVE
PROTEIN: NEGATIVE mg/dL
SPECIFIC GRAVITY, URINE: 1.015 (ref 1.005–1.030)
pH: 5 (ref 5.0–8.0)

## 2017-09-14 LAB — I-STAT TROPONIN, ED: Troponin i, poc: 0.01 ng/mL (ref 0.00–0.08)

## 2017-09-14 LAB — CBG MONITORING, ED: GLUCOSE-CAPILLARY: 125 mg/dL — AB (ref 65–99)

## 2017-09-14 LAB — LIPASE, BLOOD: Lipase: 25 U/L (ref 11–51)

## 2017-09-14 MED ORDER — ONDANSETRON 4 MG PO TBDP: 4.0000 mg | ORAL_TABLET | Freq: Three times a day (TID) | ORAL | 0 refills | Status: AC | PRN

## 2017-09-14 MED ORDER — ONDANSETRON 4 MG PO TBDP
4.0000 mg | ORAL_TABLET | Freq: Once | ORAL | Status: AC
  Administered 2017-09-14: 4 mg via ORAL
  Filled 2017-09-14: qty 1

## 2017-09-14 MED ORDER — SODIUM CHLORIDE 0.9 % IV BOLUS (SEPSIS)
1000.0000 mL | Freq: Once | INTRAVENOUS | Status: AC
  Administered 2017-09-14: 1000 mL via INTRAVENOUS

## 2017-09-14 NOTE — ED Triage Notes (Signed)
Pt from home with ems c.o syncopal episode and abd pain with diarrhea. Pt states she ate lunch today and suddenly felt sick to her stomach, pt had 2 episodes of diarrhea when she got up and had a syncopal episode in the bathroom witnessed by her husband. Pt was very pale and diaphoretic upon EMS arrival. Pt positive for orthostatic changes. Pt a.o, nad at this time

## 2017-09-14 NOTE — ED Notes (Signed)
Lab reminded of need for CMP results.

## 2017-09-14 NOTE — ED Notes (Addendum)
Pt's husband reports pt came home stating she did not feel good.  States she has been having diarrhea today, was in the BR when she had a syncopal episode.  Her husband states pt came out of the BR disoriented and had another syncopal episode.  He reports pt was diaphoretic.  Pt denies cp or SOB but reports constant nausea.  She reports being around her 26-month-old grand-daughter who has had the stomach virus.  She reports she only had nausea, no vomiting or diarrhea.

## 2017-09-14 NOTE — ED Notes (Signed)
Pt stated she did not get dizzy while standing but that she felt nauseated.

## 2017-09-14 NOTE — ED Provider Notes (Signed)
[facility] EMERGENCY DEPARTMENT Provider Note   CSN: [id number] Arrival date & time: 09/14/17  1748     History   Chief Complaint Chief Complaint  Patient presents with  . Loss of Consciousness  . Diarrhea    HPI Crystal [name] is a 67 y.o. female.  The history is provided by the patient and the spouse.  Loss of Consciousness   This is a new problem. The current episode started less than 1 hour ago. The problem has been resolved. She lost consciousness for a period of less than one minute. The problem is associated with standing up and bowel movements. Associated symptoms include diaphoresis, light-headedness and nausea. Pertinent negatives include abdominal pain, back pain, chest pain, fever, headaches, palpitations, seizures, slurred speech, vomiting and weakness. Her past medical history does not include CAD, CVA, DM, HTN or seizures.  Patient states she is taking care of her granddaughter over the weekend who had a stomach bug and had diarrhea.  She states today she had a leave work because she was having abdominal discomfort and then later in the afternoon started having multiple episodes of diarrhea.  When getting up from the toilet she had a syncopal event and was caught by her husband.  She had 2 more syncopal events after that.  2 out of the 3 events were associated with standing.  Patient states she feels better after receiving IV fluids with EMS.  She had also nausea but without any vomiting.  She denies any chest pain, palpitations, shortness of breath.  Past Medical History:  Diagnosis Date  . BACK PAIN, LUMBAR   . Breast cancer, right breast   . FIBROADENOMA, BREAST 2006  . PARESTHESIA    toes  . PONV (postoperative nausea and vomiting)   . Radiation 11/23/14-12/25/14   right breast 42.72 gray, lumpectomy cavity boosted to 54.72 gray  . Wears glasses     Patient Active Problem List   Diagnosis Date Noted  . Viral illness 09/18/2016  . Breast  cancer of lower-outer quadrant of right female breast ([place]) 10/24/2014  . Routine health maintenance 03/30/2013  . PARESTHESIA 06/25/2010    Past Surgical History:  Procedure Laterality Date  . atrophic vaginitis    . BREAST LUMPECTOMY WITH RADIOACTIVE SEED LOCALIZATION Right 10/18/2014   Procedure: BREAST LUMPECTOMY WITH RADIOACTIVE SEED LOCALIZATION;  Surgeon: Coralie Keens, MD;  Location: [facility];  Service: General;  Laterality: Right;  . COLONOSCOPY  09/05/2013  . D & C and hysteroscopy  11/1998   for uterine polyp  . DILATATION & CURRETTAGE/HYSTEROSCOPY WITH RESECTOCOPE N/A 05/30/2015   Procedure: DILATATION & CURETTAGE/HYSTEROSCOPY WITH RESECTOCOPE;  Surgeon: Princess Bruins, MD;  Location: [facility];  Service: Gynecology;  Laterality: N/A;  . DILATION AND CURETTAGE OF UTERUS    . Sterotactix needle biopsy  2000   breast calcification    OB History    Gravida Para Term Preterm AB Living   2 2       2    SAB TAB Ectopic Multiple Live Births                   Home Medications    Prior to Admission medications   Medication Sig Start Date End Date Taking? Authorizing Provider  alendronate (FOSAMAX) 70 MG tablet take 1 tablet by mouth every week on an empty stomach with A 02/02/17   Nicholas Lose, MD  amoxicillin-clavulanate (AUGMENTIN) 875-125 MG tablet Take 1 tablet by mouth 2 (two)  times daily. 06/16/17   Hoyt Koch, MD  anastrozole (ARIMIDEX) 1 MG tablet Take 1 tablet (1 mg total) by mouth daily. 02/02/17   Nicholas Lose, MD  Calcium Carbonate-Vitamin D (CALCIUM 600-D) 600-400 MG-UNIT per tablet Take 1 tablet by mouth daily.     [provider]  Cholecalciferol (VITAMIN D3) 1000 UNITS CAPS Take by mouth daily.      [provider]  ibuprofen (ADVIL,MOTRIN) 200 MG tablet Take 200-400 mg by mouth daily as needed for headache.    [provider]  Inulin (FIBER CHOICE FRUITY BITES PO) Take by mouth.    [provider]    Multiple Vitamin (MULTIVITAMIN) tablet Take 1 tablet by mouth daily.      [provider]  ondansetron (ZOFRAN ODT) 4 MG disintegrating tablet Take 1 tablet (4 mg total) by mouth every 8 (eight) hours as needed for up to 3 days for nausea or vomiting. 09/14/17 09/17/17  Tobie Poet, DO  Polyethyl Glycol-Propyl Glycol (SYSTANE OP) Apply 1 drop to eye as needed (For dryness.).    [provider]  predniSONE (DELTASONE) 20 MG tablet Take 2 tablets (40 mg total) by mouth daily with breakfast. 06/11/17   Hoyt Koch, MD    Family History Family History  Problem Relation Age of Onset  . Hypothyroidism Mother   . GER disease Mother   . Osteoporosis Mother   . Hypertension Mother   . Cancer Mother        hodgkins lympoma, uterine cancer  . Arrhythmia Mother   . Hyperlipidemia Father   . Hypertension Father   . Diabetes Father   . Coronary artery disease Father   . Coronary artery disease Maternal Grandfather   . COPD Neg Hx   . Asthma Neg Hx   . Colon cancer Neg Hx   . Esophageal cancer Neg Hx   . Rectal cancer Neg Hx   . Stomach cancer Neg Hx     Social History Social History   Tobacco Use  . Smoking status: Never Smoker  . Smokeless tobacco: Never Used  Substance Use Topics  . Alcohol use: Yes    Alcohol/week: 2.5 oz    Types: 5 Standard drinks or equivalent per week    Comment: 2 beers/week  . Drug use: No     Allergies   Patient has no known allergies.   Review of Systems Review of Systems  Constitutional: Positive for diaphoresis. Negative for chills and fever.  HENT: Negative for ear pain and sore throat.   Eyes: Negative for visual disturbance.  Respiratory: Negative for cough and shortness of breath.   Cardiovascular: Positive for syncope. Negative for chest pain and palpitations.  Gastrointestinal: Positive for diarrhea and nausea. Negative for abdominal pain and vomiting.  Genitourinary: Negative for dysuria.  Musculoskeletal:  Negative for back pain.  Skin: Negative for color change and rash.  Neurological: Positive for light-headedness. Negative for seizures, syncope, weakness and headaches.  All other systems reviewed and are negative.    Physical Exam Updated Vital Signs BP 128/74   Pulse 82   Temp 97.7 F (36.5 C) (Oral)   Resp 18   SpO2 97%   Physical Exam  Constitutional: She is oriented to person, place, and time. She appears well-developed and well-nourished. No distress.  HENT:  Head: Normocephalic and atraumatic.  Mouth/Throat: Oropharynx is clear and moist.  Eyes: Conjunctivae are normal.  Neck: Neck supple.  Cardiovascular: Normal rate and regular rhythm.  No  murmur heard. Pulmonary/Chest: Effort normal and breath sounds normal. No respiratory distress. She has no wheezes. She has no rales.  Abdominal: Soft. She exhibits no distension. There is no tenderness. There is no guarding.  Musculoskeletal: She exhibits no edema.  Neurological: She is alert and oriented to person, place, and time.  Skin: Skin is warm and dry.  Psychiatric: She has a normal mood and affect.  Nursing note and vitals reviewed.    ED Treatments / Results  Labs (all labs ordered are listed, but only abnormal results are displayed) Labs Reviewed  CBC - Abnormal; Notable for the following components:      Result Value   WBC 14.1 (*)    All other components within normal limits  URINALYSIS, ROUTINE W REFLEX MICROSCOPIC - Abnormal; Notable for the following components:   Hgb urine dipstick MODERATE (*)    Ketones, ur 20 (*)    Squamous Epithelial / LPF 0-5 (*)    All other components within normal limits  COMPREHENSIVE METABOLIC PANEL - Abnormal; Notable for the following components:   Glucose, Bld 122 (*)    Creatinine, Ser 1.05 (*)    AST 43 (*)    GFR calc non Af Amer 55 (*)    All other components within normal limits  CBG MONITORING, ED - Abnormal; Notable for the following components:    Glucose-Capillary 125 (*)    All other components within normal limits  LIPASE, BLOOD  I-STAT TROPONIN, ED    EKG  EKG Interpretation  Date/Time:  Monday September 14 2017 18:52:47 EDT Ventricular Rate:  86 PR Interval:    QRS Duration: 94 QT Interval:  379 QTC Calculation: 454 R Axis:   77 Text Interpretation:  Sinus rhythm Normal ECG since last tracing no significant change Confirmed by Noemi Chapel [id number]) on 09/14/2017 7:00:47 PM       Radiology No results found.  Procedures Procedures (including critical care time)  Medications Ordered in ED Medications  sodium chloride 0.9 % bolus 1,000 mL (0 mLs Intravenous Stopped 09/14/17 2057)  ondansetron (ZOFRAN-ODT) disintegrating tablet 4 mg (4 mg Oral Given 09/14/17 1858)     Initial Impression / Assessment and Plan / ED Course  I have reviewed the triage vital signs and the nursing notes.  Pertinent labs & imaging results that were available during my care of the patient were reviewed by me and considered in my medical decision making (see chart for details).     Patient is a 67 year old female who presents after having 3 syncopal episodes.  Patient most likely dehydrated due to a viral gastroenteritis.  She was wrestling with her young granddaughter who had a GI illness over the weekend.  Patient states her abdomen feels sore but denies any pain.  Abdominal exam is benign.  She denies having any cardiac symptoms such as palpitations or chest pain or shortness of breath related to the event.  She states she had multiple episodes of diarrhea and she had a syncopal event upon standing and walking from the toilet.  She had another syncopal event while laying on the floor.  And then again when trying to get up from the stretcher with EMS.  She states she feels better after getting IV fluids.  EMS did have a low blood pressure initially on scene.    Since being in the ED she has been in a cinematic.  EKG is unremarkable.  More IV  fluids given.  Her blood pressure did trend down with  her orthostatic vital signs.  At this time doubt cardiogenic cause of her syncope, doubt PE.  Most likely orthostatic in nature from volume depletion.  Patient is discharged in good condition.  Prescription for Zofran given.  Patient follow-up with PCP in the next few days.  Final Clinical Impressions(s) / ED Diagnoses   Final diagnoses:  Gastroenteritis  Dehydration  Syncope and collapse    ED Discharge Orders        Ordered    ondansetron (ZOFRAN ODT) 4 MG disintegrating tablet  Every 8 hours PRN     09/14/17 2152       Tobie Poet, DO 09/14/17 2157    Noemi Chapel, MD 09/16/17 1029

## 2017-09-16 ENCOUNTER — Ambulatory Visit (INDEPENDENT_AMBULATORY_CARE_PROVIDER_SITE_OTHER): Payer: BLUE CROSS/BLUE SHIELD | Admitting: Internal Medicine

## 2017-09-16 ENCOUNTER — Encounter: Payer: Self-pay | Admitting: Internal Medicine

## 2017-09-16 DIAGNOSIS — R55 Syncope and collapse: Secondary | ICD-10-CM

## 2017-09-16 DIAGNOSIS — A084 Viral intestinal infection, unspecified: Secondary | ICD-10-CM | POA: Diagnosis not present

## 2017-09-16 DIAGNOSIS — I951 Orthostatic hypotension: Secondary | ICD-10-CM | POA: Diagnosis not present

## 2017-09-16 DIAGNOSIS — E869 Volume depletion, unspecified: Secondary | ICD-10-CM | POA: Diagnosis not present

## 2017-09-16 DIAGNOSIS — R319 Hematuria, unspecified: Secondary | ICD-10-CM

## 2017-09-16 LAB — POCT URINALYSIS DIPSTICK
APPEARANCE: NORMAL
BILIRUBIN UA: NEGATIVE
GLUCOSE UA: NEGATIVE
Ketones, UA: NEGATIVE
Leukocytes, UA: NEGATIVE
Nitrite, UA: NEGATIVE
Odor: NORMAL
Protein, UA: NEGATIVE
Spec Grav, UA: 1.01 (ref 1.010–1.025)
Urobilinogen, UA: 0.2 E.U./dL
pH, UA: 6.5 (ref 5.0–8.0)

## 2017-09-16 NOTE — Patient Instructions (Signed)
Await urine culture results.  Patient is feeling better and needs to stay well-hydrated.  Symptoms have improved considerably.  Will let Dr. Sharlet Salina know of this visit today.

## 2017-09-16 NOTE — Progress Notes (Signed)
   Subjective:    Patient ID: Crystal [name], female    DOB: 12-07-1950, 67 y.o.   MRN: [id number]  HPI  67 year old Female in today for Emergency Department follow-up.  Patient usually sees Dr. Pricilla Holm at [facility].  Her 65-month-old granddaughter was ill recently with vomiting.  Patient kept her for couple of days this past weekend while parents were away.  On March 18, patient was taken to the emergency department by ambulance after a syncopal episode at home.  She had developed nausea diaphoresis and lightheadedness.  She had several episodes diarrhea.  While getting up from the toilet she had a syncopal event.  Husband called ambulance.  EMS noted she was orthostatic.  She was transported to the emergency department for IV fluids and further evaluation.  Creatinine was slightly elevated at 1.95.  SGOT was slightly elevated at 43.  Patient admits she did not get a clean-catch urine specimen in the Emergency Department.  Urinalysis showed moderate hemoglobin on dipstick with ketones present.  She had 6-30 red blood cells per high-powered field.  Culture was not done.  White blood cell count was elevated at 14,100.  7 months ago white blood cell count was 5200.  Patient says she has had blood in her urine before.  Review of her records indicates she had trace hemoglobin on urine specimen September 2018 with 0-2 red blood cells per high-powered field at that time.  Apparently has had cold blood on urine dipstick on urine specimens in November 2017 in September 2017.  These results were associated with  urinary tract infections.  We repeated her urine specimen today and it does have occult blood on urine dipstick and was sent for microscopic and culture.  Patient came back today to request check her blood pressure as suggested by emergency department physician.  She is not hypotensive however blood pressure dropped 10 points when she stands.  She is feeling much better but a little washed  out.  She denies nausea and is no longer vomiting.  Has been staying with clear liquids and crackers.    Review of Systems see above     Objective:   Physical Exam  Spent 20 minutes speaking with her about her recent illness.  It sounds like she had vasovagal syncope related to acute gastroenteritis and volume depletion.  Has had hematuria previously associated with urinary infections.  Culture will be sent.      Assessment & Plan:  Occult blood on urine dipstick  Status post recent viral gastroenteritis with syncopal episode and orthostasis  Plan: Will let Dr. Sharlet Salina know of  this visit.  Will await urine culture results.

## 2017-09-17 LAB — URINALYSIS, MICROSCOPIC ONLY
Bacteria, UA: NONE SEEN /HPF
HYALINE CAST: NONE SEEN /LPF
SQUAMOUS EPITHELIAL / LPF: NONE SEEN /HPF (ref <=5)

## 2017-09-17 LAB — URINE CULTURE
MICRO NUMBER:: 90351467
Result:: NO GROWTH
SPECIMEN QUALITY:: ADEQUATE

## 2017-09-22 ENCOUNTER — Ambulatory Visit: Payer: BLUE CROSS/BLUE SHIELD | Admitting: Internal Medicine

## 2017-09-22 ENCOUNTER — Encounter: Payer: Self-pay | Admitting: Internal Medicine

## 2017-09-22 ENCOUNTER — Other Ambulatory Visit: Payer: BLUE CROSS/BLUE SHIELD

## 2017-09-22 VITALS — BP 128/84 | HR 69 | Temp 98.0°F | Resp 16 | Wt 158.0 lb

## 2017-09-22 DIAGNOSIS — N3000 Acute cystitis without hematuria: Secondary | ICD-10-CM

## 2017-09-22 DIAGNOSIS — R300 Dysuria: Secondary | ICD-10-CM

## 2017-09-22 DIAGNOSIS — R350 Frequency of micturition: Secondary | ICD-10-CM | POA: Diagnosis not present

## 2017-09-22 LAB — POCT URINALYSIS DIPSTICK
Bilirubin, UA: NEGATIVE
GLUCOSE UA: NEGATIVE
Ketones, UA: NEGATIVE
Nitrite, UA: NEGATIVE
Protein, UA: NEGATIVE
Spec Grav, UA: 1.01 (ref 1.010–1.025)
Urobilinogen, UA: 0.2 E.U./dL
pH, UA: 6 (ref 5.0–8.0)

## 2017-09-22 MED ORDER — NITROFURANTOIN MONOHYD MACRO 100 MG PO CAPS: 100.0000 mg | ORAL_CAPSULE | Freq: Two times a day (BID) | ORAL | 0 refills | Status: DC

## 2017-09-22 NOTE — Assessment & Plan Note (Signed)
Urine dip consistent with UTI Will send urine for culture Take the antibiotic as prescribed.   Take tylenol if needed.   Increase your water intake.  Call if no improvement   

## 2017-09-22 NOTE — Progress Notes (Signed)
Subjective:    Patient ID: Crystal [name], female    DOB: Oct 31, 1950, 67 y.o.   MRN: [id number]  HPI The patient is here for an acute visit.  ? UTI:  Her symptoms started yesterday.  She has had dysuria, increased urinary frequency.  She noticed an abnormal urine odor in the morning.  She denies hematuria, fever, chills, abdominal pain, nausea and back pain.  She typically gets a UTI once a year - her last one was in 02/2017.      Medications and allergies reviewed with patient and updated if appropriate.  Patient Active Problem List   Diagnosis Date Noted  . Viral illness 09/18/2016  . Breast cancer of lower-outer quadrant of right female breast ([place]) 10/24/2014  . Routine health maintenance 03/30/2013  . PARESTHESIA 06/25/2010    Current Outpatient Medications on File Prior to Visit  Medication Sig Dispense Refill  . alendronate (FOSAMAX) 70 MG tablet take 1 tablet by mouth every week on an empty stomach with A 12 tablet 3  . anastrozole (ARIMIDEX) 1 MG tablet Take 1 tablet (1 mg total) by mouth daily. 90 tablet 3  . Calcium Carbonate-Vitamin D (CALCIUM 600-D) 600-400 MG-UNIT per tablet Take 1 tablet by mouth daily.     . Cholecalciferol (VITAMIN D3) 1000 UNITS CAPS Take by mouth daily.      Marland Kitchen ibuprofen (ADVIL,MOTRIN) 200 MG tablet Take 200-400 mg by mouth daily as needed for headache.    . Inulin (FIBER CHOICE FRUITY BITES PO) Take by mouth.    . Multiple Vitamin (MULTIVITAMIN) tablet Take 1 tablet by mouth daily.      Vladimir Faster Glycol-Propyl Glycol (SYSTANE OP) Apply 1 drop to eye as needed (For dryness.).     No current facility-administered medications on file prior to visit.     Past Medical History:  Diagnosis Date  . BACK PAIN, LUMBAR   . Breast cancer, right breast   . FIBROADENOMA, BREAST 2006  . PARESTHESIA    toes  . PONV (postoperative nausea and vomiting)   . Radiation 11/23/14-12/25/14   right breast 42.72 gray, lumpectomy cavity boosted to 54.72  gray  . Wears glasses     Past Surgical History:  Procedure Laterality Date  . atrophic vaginitis    . BREAST LUMPECTOMY WITH RADIOACTIVE SEED LOCALIZATION Right 10/18/2014   Procedure: BREAST LUMPECTOMY WITH RADIOACTIVE SEED LOCALIZATION;  Surgeon: Coralie Keens, MD;  Location: [facility];  Service: General;  Laterality: Right;  . COLONOSCOPY  09/05/2013  . D & C and hysteroscopy  11/1998   for uterine polyp  . DILATATION & CURRETTAGE/HYSTEROSCOPY WITH RESECTOCOPE N/A 05/30/2015   Procedure: DILATATION & CURETTAGE/HYSTEROSCOPY WITH RESECTOCOPE;  Surgeon: Princess Bruins, MD;  Location: [facility];  Service: Gynecology;  Laterality: N/A;  . DILATION AND CURETTAGE OF UTERUS    . Sterotactix needle biopsy  2000   breast calcification    Social History   Socioeconomic History  . Marital status: Married    Spouse name: howard  . Number of children: 2  . Years of education: 55  . Highest education level: Not on file  Occupational History  . Occupation: [employer]    Employer: [employer]  Social Needs  . Financial resource strain: Not on file  . Food insecurity:    Worry: Not on file    Inability: Not on file  . Transportation needs:    Medical: Not on file    Non-medical: Not  on file  Tobacco Use  . Smoking status: Never Smoker  . Smokeless tobacco: Never Used  Substance and Sexual Activity  . Alcohol use: Yes    Alcohol/week: 2.5 oz    Types: 5 Standard drinks or equivalent per week    Comment: 2 beers/week  . Drug use: No  . Sexual activity: Yes    Partners: Male    Comment: 1st intercourse- 34, partners-  married-  64 yrs   Lifestyle  . Physical activity:    Days per week: Not on file    Minutes per session: Not on file  . Stress: Not on file  Relationships  . Social connections:    Talks on phone: Not on file    Gets together: Not on file    Attends religious service: Not on file    Active member of club or organization: Not on  file    Attends meetings of clubs or organizations: Not on file    Relationship status: Not on file  Other Topics Concern  . Not on file  Social History Narrative   HSG, [place], Married '78,  1 son '81, 1 daughter '84, 1 grandson '12.    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 ACP - discussed with patient.    Refer - [email or web address]   Highest level of education:  BS    Family History  Problem Relation Age of Onset  . Hypothyroidism Mother   . GER disease Mother   . Osteoporosis Mother   . Hypertension Mother   . Cancer Mother        hodgkins lympoma, uterine cancer  . Arrhythmia Mother   . Hyperlipidemia Father   . Hypertension Father   . Diabetes Father   . Coronary artery disease Father   . Coronary artery disease Maternal Grandfather   . COPD Neg Hx   . Asthma Neg Hx   . Colon cancer Neg Hx   . Esophageal cancer Neg Hx   . Rectal cancer Neg Hx   . Stomach cancer Neg Hx     Review of Systems  Constitutional: Negative for chills and fever.  Gastrointestinal: Negative for abdominal pain and nausea.  Genitourinary: Positive for dysuria and frequency. Negative for hematuria.       Urine odor abnormal, no urine odor  Musculoskeletal: Negative for back pain.       Objective:   Vitals:   09/22/17 1440  BP: 128/84  Pulse: 69  Resp: 16  Temp: 98 F (36.7 C)  SpO2: 95%   Wt Readings from Last 3 Encounters:  09/22/17 158 lb (71.7 kg)  06/11/17 160 lb (72.6 kg)  03/20/17 156 lb 4 oz (70.9 kg)   Body mass index is 23 kg/m.   Physical Exam  Constitutional: She appears well-developed and well-nourished. No distress.  HENT:  Head: Normocephalic and atraumatic.  Abdominal: Soft. She exhibits no distension. There is no tenderness.  Genitourinary:  Genitourinary Comments: no cva tenderness  Skin: Skin is warm and dry. She is not diaphoretic.           Assessment & Plan:    See Problem List for Assessment  and Plan of chronic medical problems.

## 2017-09-22 NOTE — Patient Instructions (Signed)
Take the antibiotic as prescribed.  Take tylenol if needed.  Increase your water intake.     Urinary Tract Infection, Adult A urinary tract infection (UTI) is an infection of any part of the urinary tract, which includes the kidneys, ureters, bladder, and urethra. These organs make, store, and get rid of urine in the body. UTI can be a bladder infection (cystitis) or kidney infection (pyelonephritis). What are the causes? This infection may be caused by fungi, viruses, or bacteria. Bacteria are the most common cause of UTIs. This condition can also be caused by repeated incomplete emptying of the bladder during urination. What increases the risk? This condition is more likely to develop if:  You ignore your need to urinate or hold urine for long periods of time.  You do not empty your bladder completely during urination.  You wipe back to front after urinating or having a bowel movement, if you are female.  You are uncircumcised, if you are female.  You are constipated.  You have a urinary catheter that stays in place (indwelling).  You have a weak defense (immune) system.  You have a medical condition that affects your bowels, kidneys, or bladder.  You have diabetes.  You take antibiotic medicines frequently or for long periods of time, and the antibiotics no longer work well against certain types of infections (antibiotic resistance).  You take medicines that irritate your urinary tract.  You are exposed to chemicals that irritate your urinary tract.  You are female.  What are the signs or symptoms? Symptoms of this condition include:  Fever.  Frequent urination or passing small amounts of urine frequently.  Needing to urinate urgently.  Pain or burning with urination.  Urine that smells bad or unusual.  Cloudy urine.  Pain in the lower abdomen or back.  Trouble urinating.  Blood in the urine.  Vomiting or being less hungry than normal.  Diarrhea or  abdominal pain.  Vaginal discharge, if you are female.  How is this diagnosed? This condition is diagnosed with a medical history and physical exam. You will also need to provide a urine sample to test your urine. Other tests may be done, including:  Blood tests.  Sexually transmitted disease (STD) testing.  If you have had more than one UTI, a cystoscopy or imaging studies may be done to determine the cause of the infections. How is this treated? Treatment for this condition often includes a combination of two or more of the following:  Antibiotic medicine.  Other medicines to treat less common causes of UTI.  Over-the-counter medicines to treat pain.  Drinking enough water to stay hydrated.  Follow these instructions at home:  Take over-the-counter and prescription medicines only as told by your health care provider.  If you were prescribed an antibiotic, take it as told by your health care provider. Do not stop taking the antibiotic even if you start to feel better.  Avoid alcohol, caffeine, tea, and carbonated beverages. They can irritate your bladder.  Drink enough fluid to keep your urine clear or pale yellow.  Keep all follow-up visits as told by your health care provider. This is important.  Make sure to: ? Empty your bladder often and completely. Do not hold urine for long periods of time. ? Empty your bladder before and after sex. ? Wipe from front to back after a bowel movement if you are female. Use each tissue one time when you wipe. Contact a health care provider if:  You   have back pain.  You have a fever.  You feel nauseous or vomit.  Your symptoms do not get better after 3 days.  Your symptoms go away and then return. Get help right away if:  You have severe back pain or lower abdominal pain.  You are vomiting and cannot keep down any medicines or water. This information is not intended to replace advice given to you by your health care provider.  Make sure you discuss any questions you have with your health care provider. Document Released: 03/26/2005 Document Revised: 11/28/2015 Document Reviewed: 05/07/2015 Elsevier Interactive Patient Education  2018 Elsevier Inc.  

## 2017-09-24 LAB — URINE CULTURE
MICRO NUMBER: 90376259
SPECIMEN QUALITY: ADEQUATE

## 2017-10-02 ENCOUNTER — Other Ambulatory Visit: Payer: Self-pay | Admitting: Internal Medicine

## 2017-10-02 ENCOUNTER — Telehealth: Payer: Self-pay | Admitting: Emergency Medicine

## 2017-10-02 ENCOUNTER — Encounter: Payer: Self-pay | Admitting: Internal Medicine

## 2017-10-02 ENCOUNTER — Other Ambulatory Visit (INDEPENDENT_AMBULATORY_CARE_PROVIDER_SITE_OTHER): Payer: BLUE CROSS/BLUE SHIELD

## 2017-10-02 DIAGNOSIS — R350 Frequency of micturition: Secondary | ICD-10-CM | POA: Diagnosis not present

## 2017-10-02 LAB — URINALYSIS, ROUTINE W REFLEX MICROSCOPIC
Bilirubin Urine: NEGATIVE
Ketones, ur: NEGATIVE
NITRITE: NEGATIVE
Specific Gravity, Urine: <=1.005 — AB (ref 1.000–1.030)
Total Protein, Urine: NEGATIVE
Urine Glucose: NEGATIVE
Urobilinogen, UA: 0.2 (ref 0.0–1.0)
pH: 6.5 (ref 5.0–8.0)

## 2017-10-02 MED ORDER — SULFAMETHOXAZOLE-TRIMETHOPRIM 800-160 MG PO TABS: 1.0000 | ORAL_TABLET | Freq: Two times a day (BID) | ORAL | 0 refills | Status: DC

## 2017-10-02 NOTE — Telephone Encounter (Signed)
Noted - will watch for results over the weekend

## 2017-10-02 NOTE — Telephone Encounter (Signed)
Copied from [place] [id number]. Topic: Inquiry >> Oct 02, 2017  2:03 PM Scherrie Gerlach wrote: Reason for CRM: Pt saw Dr Quay Burow the other day with UTI. pt states she feels as though her UTI is coming back again , this started about lunch.  Pt feels urgency. Pt sees the results on mychart, but did not hear if she is being treated correctly. Pt would like a call back to advise.    [facility] - [place], [address] AT [facility] [address] [phone] (Phone) [phone] (Fax)

## 2017-10-02 NOTE — Telephone Encounter (Signed)
She was appropriately treated -  We can recheck her urine - UA, UCx

## 2017-10-02 NOTE — Telephone Encounter (Signed)
Spoke with pt, orders placed. Pt would like to be notified via MyChart if we get the results back before or during the weekend.

## 2017-10-04 LAB — URINE CULTURE
MICRO NUMBER: 90423075
SPECIMEN QUALITY: ADEQUATE

## 2017-10-05 ENCOUNTER — Other Ambulatory Visit: Payer: Self-pay | Admitting: Internal Medicine

## 2017-10-05 MED ORDER — AMOXICILLIN-POT CLAVULANATE 875-125 MG PO TABS: 1.0000 | ORAL_TABLET | Freq: Two times a day (BID) | ORAL | 0 refills | Status: DC

## 2017-10-22 ENCOUNTER — Ambulatory Visit: Payer: BLUE CROSS/BLUE SHIELD | Admitting: Internal Medicine

## 2017-10-22 ENCOUNTER — Ambulatory Visit (INDEPENDENT_AMBULATORY_CARE_PROVIDER_SITE_OTHER)
Admission: RE | Admit: 2017-10-22 | Discharge: 2017-10-22 | Disposition: A | Discharge status: Home / Self Care | Payer: BLUE CROSS/BLUE SHIELD | Source: Ambulatory Visit | Attending: Internal Medicine | Admitting: Internal Medicine

## 2017-10-22 ENCOUNTER — Encounter: Payer: Self-pay | Admitting: Internal Medicine

## 2017-10-22 VITALS — BP 120/78 | HR 93 | Temp 98.6°F | Ht 69.5 in | Wt 159.0 lb

## 2017-10-22 DIAGNOSIS — R102 Pelvic and perineal pain: Secondary | ICD-10-CM

## 2017-10-22 NOTE — Patient Instructions (Signed)
Try taking turmeric 400 twice a day for the arthritis.   Work on doing core exercises more to help with back and stomach muscles.    Back Exercises If you have pain in your back, do these exercises 2-3 times each day or as told by your doctor. When the pain goes away, do the exercises once each day, but repeat the steps more times for each exercise (do more repetitions). If you do not have pain in your back, do these exercises once each day or as told by your doctor. Exercises Single Knee to Chest  Do these steps 3-5 times in a row for each leg: 1. Lie on your back on a firm bed or the floor with your legs stretched out. 2. Bring one knee to your chest. 3. Hold your knee to your chest by grabbing your knee or thigh. 4. Pull on your knee until you feel a gentle stretch in your lower back. 5. Keep doing the stretch for 10-30 seconds. 6. Slowly let go of your leg and straighten it.  Pelvic Tilt  Do these steps 5-10 times in a row: 1. Lie on your back on a firm bed or the floor with your legs stretched out. 2. Bend your knees so they point up to the ceiling. Your feet should be flat on the floor. 3. Tighten your lower belly (abdomen) muscles to press your lower back against the floor. This will make your tailbone point up to the ceiling instead of pointing down to your feet or the floor. 4. Stay in this position for 5-10 seconds while you gently tighten your muscles and breathe evenly.  Cat-Cow  Do these steps until your lower back bends more easily: 1. Get on your hands and knees on a firm surface. Keep your hands under your shoulders, and keep your knees under your hips. You may put padding under your knees. 2. Let your head hang down, and make your tailbone point down to the floor so your lower back is round like the back of a cat. 3. Stay in this position for 5 seconds. 4. Slowly lift your head and make your tailbone point up to the ceiling so your back hangs low (sags) like the back  of a cow. 5. Stay in this position for 5 seconds.  Press-Ups  Do these steps 5-10 times in a row: 1. Lie on your belly (face-down) on the floor. 2. Place your hands near your head, about shoulder-width apart. 3. While you keep your back relaxed and keep your hips on the floor, slowly straighten your arms to raise the top half of your body and lift your shoulders. Do not use your back muscles. To make yourself more comfortable, you may change where you place your hands. 4. Stay in this position for 5 seconds. 5. Slowly return to lying flat on the floor.  Bridges  Do these steps 10 times in a row: 1. Lie on your back on a firm surface. 2. Bend your knees so they point up to the ceiling. Your feet should be flat on the floor. 3. Tighten your butt muscles and lift your butt off of the floor until your waist is almost as high as your knees. If you do not feel the muscles working in your butt and the back of your thighs, slide your feet 1-2 inches farther away from your butt. 4. Stay in this position for 3-5 seconds. 5. Slowly lower your butt to the floor, and let your butt  muscles relax.  If this exercise is too easy, try doing it with your arms crossed over your chest. Belly Crunches  Do these steps 5-10 times in a row: 1. Lie on your back on a firm bed or the floor with your legs stretched out. 2. Bend your knees so they point up to the ceiling. Your feet should be flat on the floor. 3. Cross your arms over your chest. 4. Tip your chin a little bit toward your chest but do not bend your neck. 5. Tighten your belly muscles and slowly raise your chest just enough to lift your shoulder blades a tiny bit off of the floor. 6. Slowly lower your chest and your head to the floor.  Back Lifts Do these steps 5-10 times in a row: 1. Lie on your belly (face-down) with your arms at your sides, and rest your forehead on the floor. 2. Tighten the muscles in your legs and your butt. 3. Slowly lift  your chest off of the floor while you keep your hips on the floor. Keep the back of your head in line with the curve in your back. Look at the floor while you do this. 4. Stay in this position for 3-5 seconds. 5. Slowly lower your chest and your face to the floor.  Contact a doctor if:  Your back pain gets a lot worse when you do an exercise.  Your back pain does not lessen 2 hours after you exercise. If you have any of these problems, stop doing the exercises. Do not do them again unless your doctor says it is okay. Get help right away if:  You have sudden, very bad back pain. If this happens, stop doing the exercises. Do not do them again unless your doctor says it is okay. This information is not intended to replace advice given to you by your health care provider. Make sure you discuss any questions you have with your health care provider. Document Released: 07/19/2010 Document Revised: 11/22/2015 Document Reviewed: 08/10/2014 Elsevier Interactive Patient Education  [employer].

## 2017-10-22 NOTE — Progress Notes (Signed)
   Subjective:    Patient ID: Crystal [name], female    DOB: 18-Nov-1950, 67 y.o.   MRN: [id number]  HPI The patient is a 67 YO female coming in for tail bone pain. Happens with sitting more than 1 hour. She noticed it at a seminar within the last 1-2 months. Since that event more pain. Not worsening. She is concerned about traveling in June via plane if she might have pain. Treated for UTI twice in the last month. Is not exercising much although does yoga once per week.   Review of Systems  Constitutional: Negative for activity change, appetite change, chills, fatigue, fever and unexpected weight change.  Respiratory: Negative.   Cardiovascular: Negative.   Gastrointestinal: Negative.   Musculoskeletal: Positive for arthralgias, back pain and myalgias. Negative for gait problem and joint swelling.  Skin: Negative.   Neurological: Negative.       Objective:   Physical Exam  Constitutional: She is oriented to person, place, and time. She appears well-developed and well-nourished.  HENT:  Head: Normocephalic and atraumatic.  Eyes: EOM are normal.  Neck: Normal range of motion.  Cardiovascular: Normal rate and regular rhythm.  Pulmonary/Chest: Effort normal and breath sounds normal. No respiratory distress. She has no wheezes. She has no rales.  Abdominal: Soft. Bowel sounds are normal. She exhibits no distension. There is no tenderness. There is no rebound.  Musculoskeletal: She exhibits tenderness. She exhibits no edema.  Tenderness at the gluteal cleft  Neurological: She is alert and oriented to person, place, and time. Coordination normal.  Skin: Skin is warm and dry.   Vitals:   10/22/17 1400  BP: 120/78  Pulse: 93  Temp: 98.6 F (37 C)  TempSrc: Oral  SpO2: 96%  Weight: 159 lb (72.1 kg)  Height: 5' 9.5" (1.765 m)      Assessment & Plan:

## 2017-10-23 DIAGNOSIS — R102 Pelvic and perineal pain: Secondary | ICD-10-CM | POA: Insufficient documentation

## 2017-10-23 NOTE — Assessment & Plan Note (Signed)
Checking U/A and pelvis x-ray given location of pain on exam is lower than lumbar x-ray would cover. Suspect arthritis and advised turmeric daily and nsaids prn.

## 2017-10-26 ENCOUNTER — Other Ambulatory Visit (INDEPENDENT_AMBULATORY_CARE_PROVIDER_SITE_OTHER): Payer: BLUE CROSS/BLUE SHIELD

## 2017-10-26 DIAGNOSIS — R102 Pelvic and perineal pain: Secondary | ICD-10-CM

## 2017-10-26 LAB — URINALYSIS, ROUTINE W REFLEX MICROSCOPIC
Bilirubin Urine: NEGATIVE
Ketones, ur: NEGATIVE
Leukocytes, UA: NEGATIVE
NITRITE: NEGATIVE
TOTAL PROTEIN, URINE-UPE24: NEGATIVE
URINE GLUCOSE: NEGATIVE
Urobilinogen, UA: 0.2 (ref 0.0–1.0)
WBC, UA: NONE SEEN (ref 0–?)
pH: 7 (ref 5.0–8.0)

## 2017-11-03 ENCOUNTER — Ambulatory Visit: Payer: BLUE CROSS/BLUE SHIELD | Admitting: Obstetrics & Gynecology

## 2017-11-03 ENCOUNTER — Encounter: Payer: Self-pay | Admitting: Obstetrics & Gynecology

## 2017-11-03 VITALS — BP 120/72

## 2017-11-03 DIAGNOSIS — N952 Postmenopausal atrophic vaginitis: Secondary | ICD-10-CM

## 2017-11-03 DIAGNOSIS — N898 Other specified noninflammatory disorders of vagina: Secondary | ICD-10-CM | POA: Diagnosis not present

## 2017-11-03 DIAGNOSIS — Z170 Estrogen receptor positive status [ER+]: Secondary | ICD-10-CM | POA: Diagnosis not present

## 2017-11-03 DIAGNOSIS — C50511 Malignant neoplasm of lower-outer quadrant of right female breast: Secondary | ICD-10-CM

## 2017-11-03 LAB — WET PREP FOR TRICH, YEAST, CLUE

## 2017-11-03 MED ORDER — FLUCONAZOLE 150 MG PO TABS: 150.0000 mg | ORAL_TABLET | Freq: Every day | ORAL | 1 refills | Status: AC

## 2017-11-03 NOTE — Progress Notes (Signed)
    Crystal [name] Feb 09, 1951 [id number]        67 y.o.  G2P2 Married  RP: Vaginal burning and itching post ABTx  HPI: 2 episodes of Cystitis treated with ABTx end of March and early April 2019.  Started itching/burning in the vagina since then.  No vaginal bleeding.  No pelvic pain.  Breast Ca Dx 2016.  Dryness with IC, tried Astroglide in August/September 2018, but it made it worse.  Not recently sexually active.    OB History  Gravida Para Term Preterm AB Living  2 2       2   SAB TAB Ectopic Multiple Live Births               # Outcome Date GA Lbr Len/2nd Weight Sex Delivery Anes PTL Lv  2 Para           1 Para             Past medical history,surgical history, problem list, medications, allergies, family history and social history were all reviewed and documented in the EPIC chart.   Directed ROS with pertinent positives and negatives documented in the history of present illness/assessment and plan.  Exam:  Vitals:   11/03/17 1514  BP: 120/72   General appearance:  Normal  Abdomen: Normal  Gynecologic exam: Vulva normal with menopausal atrophy.  No erythema or irritation.  Speculum: Cervix and vagina normal, but menopausal atrophy present.  Very little vaginal secretions.   Wet prep done.  Wet prep:  Negative   Assessment/Plan:  67 y.o. G2P2   1. Vaginal itching Post ABTx vaginal itching.  Although the wet prep was negative, given the timing of the symptoms happening after antibiotic treatment, decision to treat with an antifungal medication.  Therefore, fluconazole 150 mg 1 tab PO daily x 3 prescribed and sent to the pharmacy.  Usage, risks and benefits reviewed. - WET PREP FOR TRICH, YEAST, CLUE  2. Post-menopausal atrophic vaginitis Postmenopausal atrophic vaginitis also discussed with patient.  Given that patient has history of right breast cancer, recommend using a moisturizer such as Replens.  Given that Astroglide was not helpful with intercourse,  recommend coconut oil in the future.  3. Malignant neoplasm of lower-outer quadrant of right breast of female, estrogen receptor positive ([place]) On anastrozole.  Other orders - fluconazole (DIFLUCAN) 150 MG tablet; Take 1 tablet (150 mg total) by mouth daily for 3 doses.  Counseling on above issues and coordination of care more than 50% for 15 minutes.  Princess Bruins MD, 3:24 PM 11/03/2017

## 2017-11-04 ENCOUNTER — Encounter: Payer: Self-pay | Admitting: Obstetrics & Gynecology

## 2017-11-04 NOTE — Patient Instructions (Signed)
1. Vaginal itching Post ABTx vaginal itching.  Although the wet prep was negative, given the timing of the symptoms happening after antibiotic treatment, decision to treat with an antifungal medication.  Therefore, fluconazole 150 mg 1 tab PO daily x 3 prescribed and sent to the pharmacy.  Usage, risks and benefits reviewed. - WET PREP FOR TRICH, YEAST, CLUE  2. Post-menopausal atrophic vaginitis Postmenopausal atrophic vaginitis also discussed with patient.  Given that patient has history of right breast cancer, recommend using a moisturizer such as Replens.  Given that Astroglide was not helpful with intercourse, recommend coconut oil in the future.  3. Malignant neoplasm of lower-outer quadrant of right breast of female, estrogen receptor positive ([place]) On anastrozole.  Other orders - fluconazole (DIFLUCAN) 150 MG tablet; Take 1 tablet (150 mg total) by mouth daily for 3 doses.  Rip Harbour, good seeing you today!

## 2018-02-01 ENCOUNTER — Encounter: Payer: Self-pay | Admitting: Family

## 2018-02-01 ENCOUNTER — Other Ambulatory Visit: Payer: BLUE CROSS/BLUE SHIELD

## 2018-02-01 ENCOUNTER — Ambulatory Visit: Payer: BLUE CROSS/BLUE SHIELD | Admitting: Family

## 2018-02-01 VITALS — BP 126/78 | HR 88 | Temp 97.7°F | Ht 69.5 in | Wt 160.0 lb

## 2018-02-01 DIAGNOSIS — R300 Dysuria: Secondary | ICD-10-CM | POA: Diagnosis not present

## 2018-02-01 LAB — POC URINALSYSI DIPSTICK (AUTOMATED)
Bilirubin, UA: NEGATIVE
Glucose, UA: NEGATIVE
KETONES UA: NEGATIVE
Nitrite, UA: NEGATIVE
PH UA: 6.5 (ref 5.0–8.0)
PROTEIN UA: NEGATIVE
RBC UA: POSITIVE
SPEC GRAV UA: 1.01 (ref 1.010–1.025)
UROBILINOGEN UA: 0.2 U/dL

## 2018-02-01 MED ORDER — NITROFURANTOIN MONOHYD MACRO 100 MG PO CAPS: 100.0000 mg | ORAL_CAPSULE | Freq: Two times a day (BID) | ORAL | 0 refills | Status: DC

## 2018-02-01 NOTE — Progress Notes (Signed)
Crystal [name] is a 67 y.o. female with the following history as recorded in EpicCare:  Patient Active Problem List   Diagnosis Date Noted  . Acute pain in female pelvis 10/23/2017  . Viral illness 09/18/2016  . Acute cystitis without hematuria 05/28/2016  . Breast cancer of lower-outer quadrant of right female breast ([place]) 10/24/2014  . Routine health maintenance 03/30/2013  . PARESTHESIA 06/25/2010    Current Outpatient Medications  Medication Sig Dispense Refill  . alendronate (FOSAMAX) 70 MG tablet take 1 tablet by mouth every week on an empty stomach with A 12 tablet 3  . anastrozole (ARIMIDEX) 1 MG tablet Take 1 tablet (1 mg total) by mouth daily. 90 tablet 3  . Calcium Carbonate-Vitamin D (CALCIUM 600-D) 600-400 MG-UNIT per tablet Take 1 tablet by mouth daily.     . cetirizine (ZYRTEC) 10 MG chewable tablet Chew 10 mg by mouth daily.    . Cholecalciferol (VITAMIN D3) 1000 UNITS CAPS Take by mouth daily.      Marland Kitchen ibuprofen (ADVIL,MOTRIN) 200 MG tablet Take 200-400 mg by mouth daily as needed for headache.    . Inulin (FIBER CHOICE FRUITY BITES PO) Take by mouth.    . Multiple Vitamin (MULTIVITAMIN) tablet Take 1 tablet by mouth daily.      Vladimir Faster Glycol-Propyl Glycol (SYSTANE OP) Apply 1 drop to eye as needed (For dryness.).    Marland Kitchen TURMERIC PO Take by mouth.    . nitrofurantoin, macrocrystal-monohydrate, (MACROBID) 100 MG capsule Take 1 capsule (100 mg total) by mouth 2 (two) times daily. 14 capsule 0   No current facility-administered medications for this visit.     Allergies: Patient has no known allergies.  Past Medical History:  Diagnosis Date  . BACK PAIN, LUMBAR   . Breast cancer, right breast   . FIBROADENOMA, BREAST 2006  . PARESTHESIA    toes  . PONV (postoperative nausea and vomiting)   . Radiation 11/23/14-12/25/14   right breast 42.72 gray, lumpectomy cavity boosted to 54.72 gray  . Wears glasses     Past Surgical History:  Procedure Laterality Date  .  atrophic vaginitis    . BREAST LUMPECTOMY WITH RADIOACTIVE SEED LOCALIZATION Right 10/18/2014   Procedure: BREAST LUMPECTOMY WITH RADIOACTIVE SEED LOCALIZATION;  Surgeon: Coralie Keens, MD;  Location: [facility];  Service: General;  Laterality: Right;  . COLONOSCOPY  09/05/2013  . D & C and hysteroscopy  11/1998   for uterine polyp  . DILATATION & CURRETTAGE/HYSTEROSCOPY WITH RESECTOCOPE N/A 05/30/2015   Procedure: DILATATION & CURETTAGE/HYSTEROSCOPY WITH RESECTOCOPE;  Surgeon: Princess Bruins, MD;  Location: [facility];  Service: Gynecology;  Laterality: N/A;  . DILATION AND CURETTAGE OF UTERUS    . Sterotactix needle biopsy  2000   breast calcification    Family History  Problem Relation Age of Onset  . Hypothyroidism Mother   . GER disease Mother   . Osteoporosis Mother   . Hypertension Mother   . Cancer Mother        hodgkins lympoma, uterine cancer  . Arrhythmia Mother   . Hyperlipidemia Father   . Hypertension Father   . Diabetes Father   . Coronary artery disease Father   . Coronary artery disease Maternal Grandfather   . COPD Neg Hx   . Asthma Neg Hx   . Colon cancer Neg Hx   . Esophageal cancer Neg Hx   . Rectal cancer Neg Hx   . Stomach cancer Neg Hx  Social History   Tobacco Use  . Smoking status: Never Smoker  . Smokeless tobacco: Never Used  Substance Use Topics  . Alcohol use: Yes    Alcohol/week: 3.0 oz    Types: 5 Standard drinks or equivalent per week    Comment: 2 beers/week    Subjective:  Patient presents with concerns for UTI; + frequency/ hesitancy; symptoms started yesterday suddenly; prone to recurrent UTIs- last infection was in March 2019. No fever; + pelvic pain;   Objective:  Vitals:   02/01/18 1021  BP: 126/78  Pulse: 88  Temp: 97.7 F (36.5 C)  TempSrc: Oral  SpO2: 98%  Weight: 160 lb (72.6 kg)  Height: 5' 9.5" (1.765 m)    General: Well developed, well nourished, in no acute distress  Skin : Warm and dry.   Head: Normocephalic and atraumatic  Lungs: Respirations unlabored; clear to auscultation bilaterally without wheeze, rales, rhonchi  CVS exam: normal rate and regular rhythm.  Musculoskeletal: No deformities; no active joint inflammation; negative CVA tenderness  Extremities: No edema, cyanosis, clubbing  Vessels: Symmetric bilaterally  Neurologic: Alert and oriented; speech intact; face symmetrical; moves all extremities well; CNII-XII intact without focal deficit   Assessment:  1. Dysuria     Plan:  Suspect UTI; check U/A and urine culture; Rx for Macrobid 100 mg bid x 7 days; increase fluids, rest and follow-up worse, no better.   No follow-ups on file.  Orders Placed This Encounter  Procedures  . Urine Culture    Standing Status:   Future    Standing Expiration Date:   02/01/2019    Requested Prescriptions   Signed Prescriptions Disp Refills  . nitrofurantoin, macrocrystal-monohydrate, (MACROBID) 100 MG capsule 14 capsule 0    Sig: Take 1 capsule (100 mg total) by mouth 2 (two) times daily.

## 2018-02-02 ENCOUNTER — Ambulatory Visit: Payer: BLUE CROSS/BLUE SHIELD | Admitting: Hematology and Oncology

## 2018-02-02 LAB — URINE CULTURE
MICRO NUMBER: 90922750
SPECIMEN QUALITY: ADEQUATE

## 2018-02-03 ENCOUNTER — Inpatient Hospital Stay: Payer: BLUE CROSS/BLUE SHIELD | Attending: Hematology and Oncology | Admitting: Hematology and Oncology

## 2018-02-03 ENCOUNTER — Telehealth: Payer: Self-pay | Admitting: Hematology and Oncology

## 2018-02-03 DIAGNOSIS — M8580 Other specified disorders of bone density and structure, unspecified site: Secondary | ICD-10-CM | POA: Diagnosis not present

## 2018-02-03 DIAGNOSIS — Z923 Personal history of irradiation: Secondary | ICD-10-CM | POA: Insufficient documentation

## 2018-02-03 DIAGNOSIS — Z79811 Long term (current) use of aromatase inhibitors: Secondary | ICD-10-CM | POA: Diagnosis not present

## 2018-02-03 DIAGNOSIS — C50511 Malignant neoplasm of lower-outer quadrant of right female breast: Secondary | ICD-10-CM | POA: Diagnosis present

## 2018-02-03 DIAGNOSIS — Z170 Estrogen receptor positive status [ER+]: Secondary | ICD-10-CM | POA: Insufficient documentation

## 2018-02-03 MED ORDER — ANASTROZOLE 1 MG PO TABS: 1.0000 mg | ORAL_TABLET | Freq: Every day | ORAL | 3 refills | Status: DC

## 2018-02-03 MED ORDER — ALENDRONATE SODIUM 70 MG PO TABS: ORAL_TABLET | ORAL | 3 refills | Status: DC

## 2018-02-03 NOTE — Progress Notes (Signed)
Patient Care Team: Hoyt Koch, MD as PCP - General (Internal Medicine) Sylvan Cheese, NP as Nurse Practitioner (Nurse Practitioner) Princess Bruins, MD as Consulting Physician (Obstetrics and Gynecology)  DIAGNOSIS:  Encounter Diagnosis  Name Primary?  . Malignant neoplasm of lower-outer quadrant of right breast of female, estrogen receptor positive ([place])     SUMMARY OF ONCOLOGIC HISTORY:   Breast cancer of lower-outer quadrant of right female breast ([place])   09/04/2014 Mammogram    Right breast with 1 cm new cluster of calcifications at 7:00, 5 cm from nipple; 0.5 cm oval aysmmetry in left medial breast, 3.5 from nipple.        09/08/2014 Breast US    Right breast: possible 5 mm oval cyst, hypoechoic, correlates with mammographic findings. no axillary findings.      09/20/2014 Initial Biopsy    Right breast needle core bx: DCIS with associated calcifications, ER+ (97%), PR+ (67%)      09/20/2014 Clinical Stage    Stage 0: Tis N0      10/18/2014 Definitive Surgery    Right breast lumpectomy [facility]): DCIS, grade 1, calcifications present. ER+ (97%), PR+ (67%). 2.6 cm in size. 0.27mm from nearest margin (posterior). [facility] nomogram risk of recurrence 2% at 10 years with antiestrogen therapy and radiation      10/18/2014 Pathologic Stage    pTis, pNx: Stage 0      11/23/2014 - 12/25/2014 Radiation Therapy    Adjuvant RT completed ([place]): right breast 42.72 Gy over 16 fractions; boost to lumpectomy cavity 12 Gy; total dose received: 54.72 Gy.        12/30/2014 -  Anti-estrogen oral therapy    Tamoxifen 20 mg daily stopped 02/27/2015 due to uterine hypertrophy and bleeding, D&C 05/30/2015, anastrozole began 06/14/2015      03/09/2015 Survivorship    A copy of the survivorship care plan was mailed to the patient in lieu of an in-person visit at her request.       CHIEF COMPLIANT: Follow-up on anastrozole therapy  INTERVAL HISTORY: Crystal [name]  is a 39-year with above-mentioned history of right breast cancer who is currently on anastrozole therapy.  She appears to be tolerating anastrozole extremely well.  She does not have any hot flashes or myalgias.  Denies any lumps or nodules in the breast.  REVIEW OF SYSTEMS:   Constitutional: Denies fevers, chills or abnormal weight loss Eyes: Denies blurriness of vision Ears, nose, mouth, throat, and face: Denies mucositis or sore throat Respiratory: Denies cough, dyspnea or wheezes Cardiovascular: Denies palpitation, chest discomfort Gastrointestinal:  Denies nausea, heartburn or change in bowel habits Skin: Denies abnormal skin rashes Lymphatics: Denies new lymphadenopathy or easy bruising Neurological:Denies numbness, tingling or new weaknesses Behavioral/Psych: Mood is stable, no new changes  Extremities: No lower extremity edema Breast:  denies any pain or lumps or nodules in either breasts All other systems were reviewed with the patient and are negative.  I have reviewed the past medical history, past surgical history, social history and family history with the patient and they are unchanged from previous note.  ALLERGIES:  has No Known Allergies.  MEDICATIONS:  Current Outpatient Medications  Medication Sig Dispense Refill  . alendronate (FOSAMAX) 70 MG tablet take 1 tablet by mouth every week on an empty stomach with A 12 tablet 3  . anastrozole (ARIMIDEX) 1 MG tablet Take 1 tablet (1 mg total) by mouth daily. 90 tablet 3  . Calcium Carbonate-Vitamin D (CALCIUM 600-D) 600-400 MG-UNIT  per tablet Take 1 tablet by mouth daily.     . cetirizine (ZYRTEC) 10 MG chewable tablet Chew 10 mg by mouth daily.    . Cholecalciferol (VITAMIN D3) 1000 UNITS CAPS Take by mouth daily.      Marland Kitchen ibuprofen (ADVIL,MOTRIN) 200 MG tablet Take 200-400 mg by mouth daily as needed for headache.    . Inulin (FIBER CHOICE FRUITY BITES PO) Take by mouth.    . Multiple Vitamin (MULTIVITAMIN) tablet Take 1  tablet by mouth daily.      . nitrofurantoin, macrocrystal-monohydrate, (MACROBID) 100 MG capsule Take 1 capsule (100 mg total) by mouth 2 (two) times daily. 14 capsule 0  . Polyethyl Glycol-Propyl Glycol (SYSTANE OP) Apply 1 drop to eye as needed (For dryness.).    Marland Kitchen TURMERIC PO Take by mouth.     No current facility-administered medications for this visit.     PHYSICAL EXAMINATION: ECOG PERFORMANCE STATUS: 0 - Asymptomatic  Vitals:   02/03/18 0824  BP: 118/76  Pulse: 70  Resp: 18  Temp: 98.8 F (37.1 C)  SpO2: 100%   Filed Weights   02/03/18 0824  Weight: 158 lb 9.6 oz (71.9 kg)    GENERAL:alert, no distress and comfortable SKIN: skin color, texture, turgor are normal, no rashes or significant lesions EYES: normal, Conjunctiva are pink and non-injected, sclera clear OROPHARYNX:no exudate, no erythema and lips, buccal mucosa, and tongue normal  NECK: supple, thyroid normal size, non-tender, without nodularity LYMPH:  no palpable lymphadenopathy in the cervical, axillary or inguinal LUNGS: clear to auscultation and percussion with normal breathing effort HEART: regular rate & rhythm and no murmurs and no lower extremity edema ABDOMEN:abdomen soft, non-tender and normal bowel sounds MUSCULOSKELETAL:no cyanosis of digits and no clubbing  NEURO: alert & oriented x 3 with fluent speech, no focal motor/sensory deficits EXTREMITIES: No lower extremity edema BREAST: No palpable masses or nodules in either right or left breasts. No palpable axillary supraclavicular or infraclavicular adenopathy no breast tenderness or nipple discharge. (exam performed in the presence of a chaperone)  LABORATORY DATA:  I have reviewed the data as listed CMP Latest Ref Rng & Units 09/14/2017 04/03/2017 02/02/2017  Glucose 65 - 99 mg/dL 122(H) - 71  BUN 6 - 20 mg/dL 15 - 18.9  Creatinine 0.44 - 1.00 mg/dL 1.05(H) - 0.9  Sodium 135 - 145 mmol/L 137 - 141  Potassium 3.5 - 5.1 mmol/L 4.2 - 4.3  Chloride  101 - 111 mmol/L 102 - -  CO2 22 - 32 mmol/L 25 - 31(H)  Calcium 8.9 - 10.3 mg/dL 9.2 - 9.7  Total Protein 6.5 - 8.1 g/dL 7.5 6.7 6.9  Total Bilirubin 0.3 - 1.2 mg/dL 0.6 - 0.40  Alkaline Phos 38 - 126 U/L 56 - 57  AST 15 - 41 U/L 43(H) - 21  ALT 14 - 54 U/L 30 - 17    Lab Results  Component Value Date   WBC 14.1 (H) 09/14/2017   HGB 12.8 09/14/2017   HCT 38.9 09/14/2017   MCV 92.6 09/14/2017   PLT 229 09/14/2017   NEUTROABS 3.3 02/02/2017    ASSESSMENT & PLAN:  Breast cancer of lower-outer quadrant of right female breast ([place]) Rt Breast DCIS grade 1, 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 Tolerating anastrozole  extremely well.  Severe osteopenia: T score -2.4 based on bone mineral density done October 2016 Currently on Fosamax. In combination with calcium and vitamin D.  Breast cancer surveillance:  1. Mammograms April 2019 at [facility]: normal, breast density category D. 2. Breast Exam:02/03/18: benign  Return to clinic in 1 yearfor follow-up      No orders of the defined types were placed in this encounter.  The patient has a good understanding of the overall plan. she agrees with it. she will call with any problems that may develop before the next visit here.   Harriette Ohara, MD 02/03/18

## 2018-02-03 NOTE — Telephone Encounter (Signed)
Gave avs and calendar ° °

## 2018-02-03 NOTE — Assessment & Plan Note (Signed)
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 Tolerating anastrozole extremely well.  Severe osteopenia: T score -2.4 based on bone mineral density done October 2016 Currently on Fosamax. In combination with calcium and vitamin D.  Breast cancer surveillance:  1. Mammograms April 2019 at [facility]: normal, breast density category D. 2. Breast Exam:02/03/18: benign  Return to clinic in 1 yearfor follow-up

## 2018-02-16 ENCOUNTER — Other Ambulatory Visit: Payer: Self-pay

## 2018-02-16 ENCOUNTER — Other Ambulatory Visit: Payer: BLUE CROSS/BLUE SHIELD

## 2018-02-16 DIAGNOSIS — R300 Dysuria: Secondary | ICD-10-CM

## 2018-02-17 LAB — URINE CULTURE
MICRO NUMBER: 90990679
Result:: NO GROWTH
SPECIMEN QUALITY:: ADEQUATE

## 2018-03-31 ENCOUNTER — Other Ambulatory Visit: Payer: BLUE CROSS/BLUE SHIELD

## 2018-03-31 ENCOUNTER — Ambulatory Visit: Payer: BLUE CROSS/BLUE SHIELD | Admitting: Family

## 2018-03-31 ENCOUNTER — Encounter: Payer: Self-pay | Admitting: Family

## 2018-03-31 VITALS — BP 112/70 | HR 89 | Temp 98.2°F | Ht 69.5 in | Wt 161.0 lb

## 2018-03-31 DIAGNOSIS — R300 Dysuria: Secondary | ICD-10-CM | POA: Diagnosis not present

## 2018-03-31 DIAGNOSIS — Z23 Encounter for immunization: Secondary | ICD-10-CM | POA: Diagnosis not present

## 2018-03-31 LAB — POC URINALSYSI DIPSTICK (AUTOMATED)
Bilirubin, UA: NEGATIVE
Glucose, UA: NEGATIVE
KETONES UA: NEGATIVE
Nitrite, UA: NEGATIVE
PH UA: 6 (ref 5.0–8.0)
PROTEIN UA: NEGATIVE
RBC UA: 2
SPEC GRAV UA: 1.015 (ref 1.010–1.025)
Urobilinogen, UA: 0.2 E.U./dL

## 2018-03-31 MED ORDER — CEFUROXIME AXETIL 500 MG PO TABS: 500.0000 mg | ORAL_TABLET | Freq: Two times a day (BID) | ORAL | 0 refills | Status: DC

## 2018-03-31 NOTE — Patient Instructions (Signed)
Schedule with Dr. Sharlet Salina for CPE;

## 2018-03-31 NOTE — Progress Notes (Signed)
Crystal [name] is a 67 y.o. female with the following history as recorded in [employer]:  Patient Active Problem List   Diagnosis Date Noted  . Acute pain in female pelvis 10/23/2017  . Viral illness 09/18/2016  . Acute cystitis without hematuria 05/28/2016  . Breast cancer of lower-outer quadrant of right female breast ([place]) 10/24/2014  . Routine health maintenance 03/30/2013  . PARESTHESIA 06/25/2010    Current Outpatient Medications  Medication Sig Dispense Refill  . alendronate (FOSAMAX) 70 MG tablet take 1 tablet by mouth every week on an empty stomach with A 12 tablet 3  . anastrozole (ARIMIDEX) 1 MG tablet Take 1 tablet (1 mg total) by mouth daily. 90 tablet 3  . Calcium Carbonate-Vitamin D (CALCIUM 600-D) 600-400 MG-UNIT per tablet Take 1 tablet by mouth daily.     . cetirizine (ZYRTEC) 10 MG chewable tablet Chew 10 mg by mouth daily.    . Cholecalciferol (VITAMIN D3) 1000 UNITS CAPS Take by mouth daily.      Marland Kitchen ibuprofen (ADVIL,MOTRIN) 200 MG tablet Take 200-400 mg by mouth daily as needed for headache.    . Inulin (FIBER CHOICE FRUITY BITES PO) Take by mouth.    . Multiple Vitamin (MULTIVITAMIN) tablet Take 1 tablet by mouth daily.      Vladimir Faster Glycol-Propyl Glycol (SYSTANE OP) Apply 1 drop to eye as needed (For dryness.).    Marland Kitchen TURMERIC PO Take by mouth.    . cefUROXime (CEFTIN) 500 MG tablet Take 1 tablet (500 mg total) by mouth 2 (two) times daily with a meal. 10 tablet 0   No current facility-administered medications for this visit.     Allergies: Patient has no known allergies.  Past Medical History:  Diagnosis Date  . BACK PAIN, LUMBAR   . Breast cancer, right breast   . FIBROADENOMA, BREAST 2006  . PARESTHESIA    toes  . PONV (postoperative nausea and vomiting)   . Radiation 11/23/14-12/25/14   right breast 42.72 gray, lumpectomy cavity boosted to 54.72 gray  . Wears glasses     Past Surgical History:  Procedure Laterality Date  . atrophic vaginitis    .  BREAST LUMPECTOMY WITH RADIOACTIVE SEED LOCALIZATION Right 10/18/2014   Procedure: BREAST LUMPECTOMY WITH RADIOACTIVE SEED LOCALIZATION;  Surgeon: Coralie Keens, MD;  Location: [facility];  Service: General;  Laterality: Right;  . COLONOSCOPY  09/05/2013  . D & C and hysteroscopy  11/1998   for uterine polyp  . DILATATION & CURRETTAGE/HYSTEROSCOPY WITH RESECTOCOPE N/A 05/30/2015   Procedure: DILATATION & CURETTAGE/HYSTEROSCOPY WITH RESECTOCOPE;  Surgeon: Princess Bruins, MD;  Location: [facility];  Service: Gynecology;  Laterality: N/A;  . DILATION AND CURETTAGE OF UTERUS    . Sterotactix needle biopsy  2000   breast calcification    Family History  Problem Relation Age of Onset  . Hypothyroidism Mother   . GER disease Mother   . Osteoporosis Mother   . Hypertension Mother   . Cancer Mother        hodgkins lympoma, uterine cancer  . Arrhythmia Mother   . Hyperlipidemia Father   . Hypertension Father   . Diabetes Father   . Coronary artery disease Father   . Coronary artery disease Maternal Grandfather   . COPD Neg Hx   . Asthma Neg Hx   . Colon cancer Neg Hx   . Esophageal cancer Neg Hx   . Rectal cancer Neg Hx   . Stomach cancer Neg Hx  Social History   Tobacco Use  . Smoking status: Never Smoker  . Smokeless tobacco: Never Used  Substance Use Topics  . Alcohol use: Yes    Alcohol/week: 5.0 standard drinks    Types: 5 Standard drinks or equivalent per week    Comment: 2 beers/week    Subjective:  Patient presents with concerns for recurrent UTI; burning, hesitancy x 3 days; prone to recurrent UTI; has not seen any blood in her urine; no fever, no back pain;   Also mentions that she has been having occasional episodes at night where she "feels her heart beating harder" than normal; no chest pain, no shortness of breath; FH- mother had A. Fib.    Objective:  Vitals:   03/31/18 1512  BP: 112/70  Pulse: 89  Temp: 98.2 F (36.8 C)  TempSrc: Oral   SpO2: 97%  Weight: 161 lb (73 kg)  Height: 5' 9.5" (1.765 m)    General: Well developed, well nourished, in no acute distress  Skin : Warm and dry.  Head: Normocephalic and atraumatic  Eyes: Sclera and conjunctiva clear; pupils round and reactive to light; extraocular movements intact  Ears: External normal; canals clear; tympanic membranes normal  Oropharynx: Pink, supple. No suspicious lesions  Neck: Supple without thyromegaly, adenopathy  Lungs: Respirations unlabored;  Neurologic: Alert and oriented; speech intact; face symmetrical; moves all extremities well; CNII-XII intact without focal deficit  Assessment:  1. Dysuria   2. Need for influenza vaccination     Plan:  1. Update U/A and urine culture; Rx for Ceftin 500 mg bid x 5 days; discussed seeing urology due to frequency of her symptoms but she wants to talk to her PCP first;  2. Flu shot given;  Patient also defers referral for cardiology until she speaks to her PCP; she will call back if unable to see her PCP in timely manner. Discussed need for holter, echo;   No follow-ups on file.  Orders Placed This Encounter  Procedures  . Urine Culture    Standing Status:   Future    Standing Expiration Date:   03/31/2019  . Flu vaccine HIGH DOSE PF  . POCT Urinalysis Dipstick (Automated)    Requested Prescriptions   Signed Prescriptions Disp Refills  . cefUROXime (CEFTIN) 500 MG tablet 10 tablet 0    Sig: Take 1 tablet (500 mg total) by mouth 2 (two) times daily with a meal.

## 2018-04-02 LAB — URINE CULTURE
MICRO NUMBER: 91183776
SPECIMEN QUALITY:: ADEQUATE

## 2018-04-07 ENCOUNTER — Other Ambulatory Visit (INDEPENDENT_AMBULATORY_CARE_PROVIDER_SITE_OTHER): Payer: BLUE CROSS/BLUE SHIELD

## 2018-04-07 ENCOUNTER — Encounter: Payer: Self-pay | Admitting: Internal Medicine

## 2018-04-07 ENCOUNTER — Ambulatory Visit: Payer: BLUE CROSS/BLUE SHIELD | Admitting: Internal Medicine

## 2018-04-07 VITALS — BP 110/62 | HR 68 | Temp 98.2°F | Ht 69.5 in | Wt 159.0 lb

## 2018-04-07 DIAGNOSIS — Z0000 Encounter for general adult medical examination without abnormal findings: Secondary | ICD-10-CM

## 2018-04-07 DIAGNOSIS — Z23 Encounter for immunization: Secondary | ICD-10-CM

## 2018-04-07 DIAGNOSIS — C50511 Malignant neoplasm of lower-outer quadrant of right female breast: Secondary | ICD-10-CM

## 2018-04-07 DIAGNOSIS — Z170 Estrogen receptor positive status [ER+]: Secondary | ICD-10-CM

## 2018-04-07 LAB — LIPID PANEL
Cholesterol: 187 mg/dL (ref 0–200)
HDL: 77.2 mg/dL (ref >39.00)
LDL Cholesterol: 91 mg/dL (ref 0–99)
NONHDL: 109.92
Total CHOL/HDL Ratio: 2
Triglycerides: 97 mg/dL (ref 0.0–149.0)
VLDL: 19.4 mg/dL (ref 0.0–40.0)

## 2018-04-07 LAB — CBC
HEMATOCRIT: 37.8 % (ref 36.0–46.0)
Hemoglobin: 12.7 g/dL (ref 12.0–15.0)
MCHC: 33.5 g/dL (ref 30.0–36.0)
MCV: 91.9 fl (ref 78.0–100.0)
Platelets: 285 10*3/uL (ref 150.0–400.0)
RBC: 4.11 Mil/uL (ref 3.87–5.11)
RDW: 12.9 % (ref 11.5–15.5)
WBC: 6.6 10*3/uL (ref 4.0–10.5)

## 2018-04-07 LAB — URINALYSIS, ROUTINE W REFLEX MICROSCOPIC
Bilirubin Urine: NEGATIVE
KETONES UR: NEGATIVE
Leukocytes, UA: NEGATIVE
Nitrite: NEGATIVE
PH: 6.5 (ref 5.0–8.0)
Total Protein, Urine: NEGATIVE
Urine Glucose: NEGATIVE
Urobilinogen, UA: 0.2 (ref 0.0–1.0)
WBC UA: NONE SEEN (ref 0–?)

## 2018-04-07 LAB — COMPREHENSIVE METABOLIC PANEL
ALK PHOS: 50 U/L (ref 39–117)
ALT: 16 U/L (ref 0–35)
AST: 19 U/L (ref 0–37)
Albumin: 4.3 g/dL (ref 3.5–5.2)
BILIRUBIN TOTAL: 0.4 mg/dL (ref 0.2–1.2)
BUN: 17 mg/dL (ref 6–23)
CO2: 32 mEq/L (ref 19–32)
Calcium: 10.1 mg/dL (ref 8.4–10.5)
Chloride: 102 mEq/L (ref 96–112)
Creatinine, Ser: 0.85 mg/dL (ref 0.40–1.20)
GFR: 71.01 mL/min (ref >60.00)
GLUCOSE: 104 mg/dL — AB (ref 70–99)
Potassium: 4.4 mEq/L (ref 3.5–5.1)
SODIUM: 139 meq/L (ref 135–145)
TOTAL PROTEIN: 7.6 g/dL (ref 6.0–8.3)

## 2018-04-07 LAB — HEMOGLOBIN A1C: HEMOGLOBIN A1C: 5.6 % (ref 4.6–6.5)

## 2018-04-07 NOTE — Patient Instructions (Signed)
We have given you the pneumonia shot today.  Health Maintenance, Female Adopting a healthy lifestyle and getting preventive care can go a long way to promote health and wellness. Talk with your health care provider about what schedule of regular examinations is right for you. This is a good chance for you to check in with your provider about disease prevention and staying healthy. In between checkups, there are plenty of things you can do on your own. Experts have done a lot of research about which lifestyle changes and preventive measures are most likely to keep you healthy. Ask your health care provider for more information. Weight and diet Eat a healthy diet  Be sure to include plenty of vegetables, fruits, low-fat dairy products, and lean protein.  Do not eat a lot of foods high in solid fats, added sugars, or salt.  Get regular exercise. This is one of the most important things you can do for your health. ? Most adults should exercise for at least 150 minutes each week. The exercise should increase your heart rate and make you sweat (moderate-intensity exercise). ? Most adults should also do strengthening exercises at least twice a week. This is in addition to the moderate-intensity exercise.  Maintain a healthy weight  Body mass index (BMI) is a measurement that can be used to identify possible weight problems. It estimates body fat based on height and weight. Your health care provider can help determine your BMI and help you achieve or maintain a healthy weight.  For females 82 years of age and older: ? A BMI below 18.5 is considered underweight. ? A BMI of 18.5 to 24.9 is normal. ? A BMI of 25 to 29.9 is considered overweight. ? A BMI of 30 and above is considered obese.  Watch levels of cholesterol and blood lipids  You should start having your blood tested for lipids and cholesterol at 67 years of age, then have this test every 5 years.  You may need to have your cholesterol  levels checked more often if: ? Your lipid or cholesterol levels are high. ? You are older than 66 years of age. ? You are at high risk for heart disease.  Cancer screening Lung Cancer  Lung cancer screening is recommended for adults 32-54 years old who are at high risk for lung cancer because of a history of smoking.  A yearly low-dose CT scan of the lungs is recommended for people who: ? Currently smoke. ? Have quit within the past 15 years. ? Have at least a 30-pack-year history of smoking. A pack year is smoking an average of one pack of cigarettes a day for 1 year.  Yearly screening should continue until it has been 15 years since you quit.  Yearly screening should stop if you develop a health problem that would prevent you from having lung cancer treatment.  Breast Cancer  Practice breast self-awareness. This means understanding how your breasts normally appear and feel.  It also means doing regular breast self-exams. Let your health care provider know about any changes, no matter how small.  If you are in your 20s or 30s, you should have a clinical breast exam (CBE) by a health care provider every 1-3 years as part of a regular health exam.  If you are 56 or older, have a CBE every year. Also consider having a breast X-ray (mammogram) every year.  If you have a family history of breast cancer, talk to your health care provider about  genetic screening.  If you are at high risk for breast cancer, talk to your health care provider about having an MRI and a mammogram every year.  Breast cancer gene (BRCA) assessment is recommended for women who have family members with BRCA-related cancers. BRCA-related cancers include: ? Breast. ? Ovarian. ? Tubal. ? Peritoneal cancers.  Results of the assessment will determine the need for genetic counseling and BRCA1 and BRCA2 testing.  Cervical Cancer Your health care provider may recommend that you be screened regularly for cancer of  the pelvic organs (ovaries, uterus, and vagina). This screening involves a pelvic examination, including checking for microscopic changes to the surface of your cervix (Pap test). You may be encouraged to have this screening done every 3 years, beginning at age 98.  For women ages 40-65, health care providers may recommend pelvic exams and Pap testing every 3 years, or they may recommend the Pap and pelvic exam, combined with testing for human papilloma virus (HPV), every 5 years. Some types of HPV increase your risk of cervical cancer. Testing for HPV may also be done on women of any age with unclear Pap test results.  Other health care providers may not recommend any screening for nonpregnant women who are considered low risk for pelvic cancer and who do not have symptoms. Ask your health care provider if a screening pelvic exam is right for you.  If you have had past treatment for cervical cancer or a condition that could lead to cancer, you need Pap tests and screening for cancer for at least 20 years after your treatment. If Pap tests have been discontinued, your risk factors (such as having a new sexual partner) need to be reassessed to determine if screening should resume. Some women have medical problems that increase the chance of getting cervical cancer. In these cases, your health care provider may recommend more frequent screening and Pap tests.  Colorectal Cancer  This type of cancer can be detected and often prevented.  Routine colorectal cancer screening usually begins at 66 years of age and continues through 66 years of age.  Your health care provider may recommend screening at an earlier age if you have risk factors for colon cancer.  Your health care provider may also recommend using home test kits to check for hidden blood in the stool.  A small camera at the end of a tube can be used to examine your colon directly (sigmoidoscopy or colonoscopy). This is done to check for the  earliest forms of colorectal cancer.  Routine screening usually begins at age 28.  Direct examination of the colon should be repeated every 5-10 years through 66 years of age. However, you may need to be screened more often if early forms of precancerous polyps or small growths are found.  Skin Cancer  Check your skin from head to toe regularly.  Tell your health care provider about any new moles or changes in moles, especially if there is a change in a mole's shape or color.  Also tell your health care provider if you have a mole that is larger than the size of a pencil eraser.  Always use sunscreen. Apply sunscreen liberally and repeatedly throughout the day.  Protect yourself by wearing long sleeves, pants, a wide-brimmed hat, and sunglasses whenever you are outside.  Heart disease, diabetes, and high blood pressure  High blood pressure causes heart disease and increases the risk of stroke. High blood pressure is more likely to develop in: ? People  who have blood pressure in the high end of the normal range (130-139/85-89 mm Hg). ? People who are overweight or obese. ? People who are African American.  If you are 5-22 years of age, have your blood pressure checked every 3-5 years. If you are 69 years of age or older, have your blood pressure checked every year. You should have your blood pressure measured twice-once when you are at a hospital or clinic, and once when you are not at a hospital or clinic. Record the average of the two measurements. To check your blood pressure when you are not at a hospital or clinic, you can use: ? An automated blood pressure machine at a pharmacy. ? A home blood pressure monitor.  If you are between 29 years and 27 years old, ask your health care provider if you should take aspirin to prevent strokes.  Have regular diabetes screenings. This involves taking a blood sample to check your fasting blood sugar level. ? If you are at a normal weight and  have a low risk for diabetes, have this test once every three years after 66 years of age. ? If you are overweight and have a high risk for diabetes, consider being tested at a younger age or more often. Preventing infection Hepatitis B  If you have a higher risk for hepatitis B, you should be screened for this virus. You are considered at high risk for hepatitis B if: ? You were born in a country where hepatitis B is common. Ask your health care provider which countries are considered high risk. ? Your parents were born in a high-risk country, and you have not been immunized against hepatitis B (hepatitis B vaccine). ? You have HIV or AIDS. ? You use needles to inject street drugs. ? You live with someone who has hepatitis B. ? You have had sex with someone who has hepatitis B. ? You get hemodialysis treatment. ? You take certain medicines for conditions, including cancer, organ transplantation, and autoimmune conditions.  Hepatitis C  Blood testing is recommended for: ? Everyone born from [birth year] through 1964. ? Anyone with known risk factors for hepatitis C.  Sexually transmitted infections (STIs)  You should be screened for sexually transmitted infections (STIs) including gonorrhea and chlamydia if: ? You are sexually active and are younger than 66 years of age. ? You are older than 66 years of age and your health care provider tells you that you are at risk for this type of infection. ? Your sexual activity has changed since you were last screened and you are at an increased risk for chlamydia or gonorrhea. Ask your health care provider if you are at risk.  If you do not have HIV, but are at risk, it may be recommended that you take a prescription medicine daily to prevent HIV infection. This is called pre-exposure prophylaxis (PrEP). You are considered at risk if: ? You are sexually active and do not regularly use condoms or know the HIV status of your partner(s). ? You take drugs by  injection. ? You are sexually active with a partner who has HIV.  Talk with your health care provider about whether you are at high risk of being infected with HIV. If you choose to begin PrEP, you should first be tested for HIV. You should then be tested every 3 months for as long as you are taking PrEP. Pregnancy  If you are premenopausal and you may become pregnant, ask your health care  provider about preconception counseling.  If you may become pregnant, take 400 to 800 micrograms (mcg) of folic acid every day.  If you want to prevent pregnancy, talk to your health care provider about birth control (contraception). Osteoporosis and menopause  Osteoporosis is a disease in which the bones lose minerals and strength with aging. This can result in serious bone fractures. Your risk for osteoporosis can be identified using a bone density scan.  If you are 35 years of age or older, or if you are at risk for osteoporosis and fractures, ask your health care provider if you should be screened.  Ask your health care provider whether you should take a calcium or vitamin D supplement to lower your risk for osteoporosis.  Menopause may have certain physical symptoms and risks.  Hormone replacement therapy may reduce some of these symptoms and risks. Talk to your health care provider about whether hormone replacement therapy is right for you. Follow these instructions at home:  Schedule regular health, dental, and eye exams.  Stay current with your immunizations.  Do not use any tobacco products including cigarettes, chewing tobacco, or electronic cigarettes.  If you are pregnant, do not drink alcohol.  If you are breastfeeding, limit how much and how often you drink alcohol.  Limit alcohol intake to no more than 1 drink per day for nonpregnant women. One drink equals 12 ounces of beer, 5 ounces of wine, or 1 ounces of hard liquor.  Do not use street drugs.  Do not share needles.  Ask  your health care provider for help if you need support or information about quitting drugs.  Tell your health care provider if you often feel depressed.  Tell your health care provider if you have ever been abused or do not feel safe at home. This information is not intended to replace advice given to you by your health care provider. Make sure you discuss any questions you have with your health care provider. Document Released: 12/30/2010 Document Revised: 11/22/2015 Document Reviewed: 03/20/2015 Elsevier Interactive Patient Education  [employer].

## 2018-04-07 NOTE — Progress Notes (Signed)
   Subjective:    Patient ID: Crystal [name], female    DOB: 1950/10/19, 67 y.o.   MRN: [id number]  HPI The patient is a 67 YO female coming in for physical.   PMH, [facility], social history reviewed and updated.   Review of Systems  Constitutional: Negative.   HENT: Negative.   Eyes: Negative.   Respiratory: Negative for cough, chest tightness and shortness of breath.   Cardiovascular: Negative for chest pain, palpitations and leg swelling.  Gastrointestinal: Negative for abdominal distention, abdominal pain, constipation, diarrhea, nausea and vomiting.  Musculoskeletal: Negative.   Skin: Negative.   Neurological: Negative.   Psychiatric/Behavioral: Negative.       Objective:   Physical Exam  Constitutional: She is oriented to person, place, and time. She appears well-developed and well-nourished.  HENT:  Head: Normocephalic and atraumatic.  Eyes: EOM are normal.  Neck: Normal range of motion.  Cardiovascular: Normal rate and regular rhythm.  Pulmonary/Chest: Effort normal and breath sounds normal. No respiratory distress. She has no wheezes. She has no rales.  Abdominal: Soft. Bowel sounds are normal. She exhibits no distension. There is no tenderness. There is no rebound.  Musculoskeletal: She exhibits no edema.  Neurological: She is alert and oriented to person, place, and time. Coordination normal.  Skin: Skin is warm and dry.  Psychiatric: She has a normal mood and affect.   Vitals:   04/07/18 0905  BP: 110/62  Pulse: 68  Temp: 98.2 F (36.8 C)  TempSrc: Oral  SpO2: 96%  Weight: 159 lb (72.1 kg)  Height: 5' 9.5" (1.765 m)      Assessment & Plan:  Prevnar 13 given at visit

## 2018-04-07 NOTE — Assessment & Plan Note (Signed)
Flu shot up to date. Pneumonia given 67, needs 23 in 1 year. Shingrix counseled. Tetanus up to date. Colonoscopy up to date. Mammogram up to date, pap smear up to date and dexa up to date. Counseled about sun safety and mole surveillance. Counseled about the dangers of distracted driving. Given 10 year screening recommendations.

## 2018-04-07 NOTE — Assessment & Plan Note (Signed)
Still on arimidex and fosamax for another 2 years then will likely stop both.

## 2018-05-18 ENCOUNTER — Encounter: Payer: Self-pay | Admitting: Obstetrics & Gynecology

## 2018-05-18 ENCOUNTER — Ambulatory Visit (INDEPENDENT_AMBULATORY_CARE_PROVIDER_SITE_OTHER): Payer: BLUE CROSS/BLUE SHIELD | Admitting: Obstetrics & Gynecology

## 2018-05-18 VITALS — BP 128/84 | Ht 69.25 in | Wt 159.0 lb

## 2018-05-18 DIAGNOSIS — M8589 Other specified disorders of bone density and structure, multiple sites: Secondary | ICD-10-CM

## 2018-05-18 DIAGNOSIS — Z780 Asymptomatic menopausal state: Secondary | ICD-10-CM | POA: Diagnosis not present

## 2018-05-18 DIAGNOSIS — Z01419 Encounter for gynecological examination (general) (routine) without abnormal findings: Secondary | ICD-10-CM

## 2018-05-18 DIAGNOSIS — R3121 Asymptomatic microscopic hematuria: Secondary | ICD-10-CM | POA: Diagnosis not present

## 2018-05-18 DIAGNOSIS — C50511 Malignant neoplasm of lower-outer quadrant of right female breast: Secondary | ICD-10-CM

## 2018-05-18 DIAGNOSIS — Z9289 Personal history of other medical treatment: Secondary | ICD-10-CM

## 2018-05-18 DIAGNOSIS — Z170 Estrogen receptor positive status [ER+]: Secondary | ICD-10-CM

## 2018-05-18 NOTE — Progress Notes (Signed)
Crystal [name] 09-11-1950 [id number]   History:    67 y.o. G2P2L2 Married  RP:  Established patient presenting for annual gyn exam   HPI: Postmenopausal, well on no hormone replacement therapy.  No postmenopausal bleeding.  History of right breast cancer in situ in 2016, status post lumpectomy with radiation therapy.  On anastrozole for 3 years.  Followed by Dr. Jana Hakim.  No pelvic pain.  Rarely sexually active because of pain and dryness.  Did not try with coconut oil so far.  Has had 3 cystitis in the past year.  Followed by her family physician.  Frequently has microscopic hematuria.  Not seen by urology so far.  Bowel movements normal.  Health labs with family physician.  Good body mass index at 23.31.  Enjoys yoga.  Past medical history,surgical history, family history and social history were all reviewed and documented in the EPIC chart.  Gynecologic History No LMP recorded. Patient is postmenopausal. Contraception: post menopausal status Last Pap: 01/2017. Results were: Negative/HPV HR neg Last mammogram: 2019. Results were: normal Bone Density: 05/2017 Osteopenia, improved at all sites on Fosamax Colonoscopy: 2015  Obstetric History OB History  Gravida Para Term Preterm AB Living  2 2       2   SAB TAB Ectopic Multiple Live Births               # Outcome Date GA Lbr Len/2nd Weight Sex Delivery Anes PTL Lv  2 Para           1 Para              ROS: A ROS was performed and pertinent positives and negatives are included in the history.  GENERAL: No fevers or chills. HEENT: No change in vision, no earache, sore throat or sinus congestion. NECK: No pain or stiffness. CARDIOVASCULAR: No chest pain or pressure. No palpitations. PULMONARY: No shortness of breath, cough or wheeze. GASTROINTESTINAL: No abdominal pain, nausea, vomiting or diarrhea, melena or bright red blood per rectum. GENITOURINARY: No urinary frequency, urgency, hesitancy or dysuria. MUSCULOSKELETAL: No joint  or muscle pain, no back pain, no recent trauma. DERMATOLOGIC: No rash, no itching, no lesions. ENDOCRINE: No polyuria, polydipsia, no heat or cold intolerance. No recent change in weight. HEMATOLOGICAL: No anemia or easy bruising or bleeding. NEUROLOGIC: No headache, seizures, numbness, tingling or weakness. PSYCHIATRIC: No depression, no loss of interest in normal activity or change in sleep pattern.     Exam:   BP 128/84   Ht 5' 9.25" (1.759 m)   Wt 159 lb (72.1 kg)   BMI 23.31 kg/m   Body mass index is 23.31 kg/m.  General appearance : Well developed well nourished female. No acute distress HEENT: Eyes: no retinal hemorrhage or exudates,  Neck supple, trachea midline, no carotid bruits, no thyroidmegaly Lungs: Clear to auscultation, no rhonchi or wheezes, or rib retractions  Heart: Regular rate and rhythm, no murmurs or gallops Breast:Examined in sitting and supine position were symmetrical in appearance, no palpable masses or tenderness,  no skin retraction, no nipple inversion, no nipple discharge, no skin discoloration, no axillary or supraclavicular lymphadenopathy Abdomen: no palpable masses or tenderness, no rebound or guarding Extremities: no edema or skin discoloration or tenderness  Pelvic: Vulva: Normal             Vagina: No gross lesions or discharge  Cervix: No gross lesions or discharge  Uterus  AV, normal size, shape and consistency, non-tender and mobile  Adnexa  Without masses or tenderness  Anus: Normal  U/A: Yellow clear, protein negative, nitrites negative, white blood cells negative, red blood cells 3-10, bacteria negative.  Urine culture pending.   Assessment/Plan:  67 y.o. female for annual exam   1. Well female exam with routine gynecological exam Normal gynecologic exam.  Pap test negative with negative high-risk HPV in August 2018.  No indication to repeat this year.  Breast exam normal.  Screening mammogram normal in 2019.  Will obtain report.   Colonoscopy 2015.  Health labs with family physician.  2. Postmenopausal Postmenopausal well on no hormone replacement therapy.  No postmenopausal bleeding.  3. Osteopenia of multiple sites Bone density December 2018 with osteopenia improved at all sites on Fosamax.  Will continue on Fosamax until repeat bone density in 2020.  Vitamin D supplements, calcium intake of 1.5 g/day and regular weightbearing physical activity recommended.   4. Asymptomatic microscopic hematuria Urine analysis negative except for hematuria with red blood cells 3-10 per high power field.  Patient also had 3 cystitis in the last year.  Decision to refer to urology for further investigation. - Urinalysis,Complete w/RFL Culture  5. Malignant neoplasm of lower-outer quadrant of right breast of female, estrogen receptor positive ([place]) On Anastrazole.  Followed by Dr Jana Hakim  Princess Bruins MD, 10:20 AM 05/18/2018

## 2018-05-20 LAB — URINE CULTURE
MICRO NUMBER: 91392727
RESULT: NO GROWTH
SPECIMEN QUALITY: ADEQUATE

## 2018-05-20 LAB — URINALYSIS, COMPLETE W/RFL CULTURE
BILIRUBIN URINE: NEGATIVE
Bacteria, UA: NONE SEEN /HPF
GLUCOSE, UA: NEGATIVE
Hyaline Cast: NONE SEEN /LPF
KETONES UR: NEGATIVE
Leukocyte Esterase: NEGATIVE
NITRITES URINE, INITIAL: NEGATIVE
PH: 7 (ref 5.0–8.0)
Protein, ur: NEGATIVE
SPECIFIC GRAVITY, URINE: 1.01 (ref 1.001–1.03)
WBC UA: NONE SEEN /HPF (ref 0–5)

## 2018-05-20 LAB — CULTURE INDICATED

## 2018-05-21 ENCOUNTER — Encounter: Payer: Self-pay | Admitting: *Deleted

## 2018-05-24 ENCOUNTER — Telehealth: Payer: Self-pay | Admitting: *Deleted

## 2018-05-24 ENCOUNTER — Encounter: Payer: Self-pay | Admitting: Obstetrics & Gynecology

## 2018-05-24 NOTE — Telephone Encounter (Signed)
Office notes faxed and referral placed in [place] at [facility] they will call to schedule.

## 2018-05-24 NOTE — Patient Instructions (Signed)
1. Well female exam with routine gynecological exam Normal gynecologic exam.  Pap test negative with negative high-risk HPV in August 2018.  No indication to repeat this year.  Breast exam normal.  Screening mammogram normal in 2019.  Will obtain report.  Colonoscopy 2015.  Health labs with family physician.  2. Postmenopausal Postmenopausal well on no hormone replacement therapy.  No postmenopausal bleeding.  3. Osteopenia of multiple sites Bone density December 2018 with osteopenia improved at all sites on Fosamax.  Will continue on Fosamax until repeat bone density in 2020.  Vitamin D supplements, calcium intake of 1.5 g/day and regular weightbearing physical activity recommended.   4. Asymptomatic microscopic hematuria Urine analysis negative except for hematuria with red blood cells 3-10 per high power field.  Patient also had 3 cystitis in the last year.  Decision to refer to urology for further investigation. - Urinalysis,Complete w/RFL Culture  5. Malignant neoplasm of lower-outer quadrant of right breast of female, estrogen receptor positive ([name]) On Anastrazole.  Followed by Dr Bing Neighbors, it was a pleasure seeing you today!

## 2018-05-24 NOTE — Telephone Encounter (Signed)
-----   Message from Princess Bruins, MD sent at 05/18/2018  4:10 PM EST ----- Regarding: Refer to Urology U/A with RBC 3-10.  Persistent Hematuria with no evidence of Cystitis on U/A, pending U. Culture.  3 Cystitis in last year.  Refer to Urology for further investigation.

## 2018-06-10 ENCOUNTER — Encounter: Payer: Self-pay | Admitting: *Deleted

## 2018-06-10 NOTE — Telephone Encounter (Signed)
Patient on 06/30/18 @ 8:30am with Dr. Matilde Sprang,

## 2018-06-11 NOTE — Telephone Encounter (Signed)
[facility] message sent informing patient

## 2018-06-15 ENCOUNTER — Encounter: Payer: Self-pay | Admitting: Internal Medicine

## 2018-06-15 ENCOUNTER — Ambulatory Visit: Payer: BLUE CROSS/BLUE SHIELD | Admitting: Internal Medicine

## 2018-06-15 VITALS — BP 112/72 | HR 88 | Temp 98.1°F | Ht 69.5 in | Wt 159.0 lb

## 2018-06-15 DIAGNOSIS — R8290 Unspecified abnormal findings in urine: Secondary | ICD-10-CM

## 2018-06-15 DIAGNOSIS — R350 Frequency of micturition: Secondary | ICD-10-CM | POA: Diagnosis not present

## 2018-06-15 DIAGNOSIS — R300 Dysuria: Secondary | ICD-10-CM | POA: Diagnosis not present

## 2018-06-15 LAB — POCT URINALYSIS DIPSTICK
Bilirubin, UA: NEGATIVE
GLUCOSE UA: NEGATIVE
Ketones, UA: NEGATIVE
NITRITE UA: NEGATIVE
PROTEIN UA: NEGATIVE
SPEC GRAV UA: 1.01 (ref 1.010–1.025)
Urobilinogen, UA: 0.2 E.U./dL
pH, UA: 6.5 (ref 5.0–8.0)

## 2018-06-15 MED ORDER — CIPROFLOXACIN HCL 250 MG PO TABS: 250.0000 mg | ORAL_TABLET | Freq: Two times a day (BID) | ORAL | 0 refills | Status: DC

## 2018-06-15 NOTE — Patient Instructions (Signed)
It was a pleasure to see you today.  Urine culture pending.  Cipro 250 mg twice daily for 7 days.  Further instructions to follow pending culture results.

## 2018-06-15 NOTE — Progress Notes (Signed)
   Subjective:    Patient ID: Roselind Rily, female    DOB: 05-Sep-1950, 67 y.o.   MRN: [id number]  HPI 67 year old Female  with frequency, urgency, and lower abdomen discomfort. Has had several UTIs past few years  and has appt soon to see Urologist at [facility] January 10.   Has had UTI Jan 2017- E.coli                       Nov 2017 Citrobacter freundii                        Sept 2018 E coli- resistant to Augmentin                        March 2019 Citrobacter koseri                         April 2019 E.coli 10,000- 50,000 CFU                          October 2019 E.coli greater than 100,000  CFU  Generally treated with Macrobid or cephalosporin.   Hx right breast cancer 2016 DCIS ER+,PR+ treated with lumpectomy, radiation and antiestrogen therapy  Hx peripheral neuropathy toe paresthesias idiopathic  Has headache mid lower forehead almost daily and takes NSAID one tab once or twice daily with relief.  Urine dipstick today abnormal. Culture sent.    Review of Systems no fever or chills.No frank hematuria but has had blood on urine dipstick several times this past year.     Objective:   Physical Exam  No CVA tenderness.Culture sent. Small LE. Moderate occult blood on dipstick.      Assessment & Plan:  Acute UTI Recurrent UTI Plan: Cipro 250 mg bid x 7days. Further instructions to follow pending culture.

## 2018-06-17 LAB — URINE CULTURE
MICRO NUMBER:: 91508363
SPECIMEN QUALITY: ADEQUATE

## 2018-06-25 ENCOUNTER — Encounter: Payer: Self-pay | Admitting: Internal Medicine

## 2018-06-25 ENCOUNTER — Other Ambulatory Visit (INDEPENDENT_AMBULATORY_CARE_PROVIDER_SITE_OTHER): Payer: BLUE CROSS/BLUE SHIELD | Admitting: Internal Medicine

## 2018-06-25 VITALS — BP 120/80 | HR 70 | Temp 98.2°F | Ht 69.0 in | Wt 159.0 lb

## 2018-06-25 DIAGNOSIS — Z87440 Personal history of urinary (tract) infections: Secondary | ICD-10-CM | POA: Diagnosis not present

## 2018-06-25 LAB — POCT URINALYSIS DIPSTICK
APPEARANCE: NEGATIVE
BILIRUBIN UA: NEGATIVE
GLUCOSE UA: NEGATIVE
Ketones, UA: NEGATIVE
LEUKOCYTES UA: NEGATIVE
Nitrite, UA: NEGATIVE
ODOR: NEGATIVE
PH UA: 7.5 (ref 5.0–8.0)
Protein, UA: NEGATIVE
Spec Grav, UA: 1.01 (ref 1.010–1.025)
UROBILINOGEN UA: 0.2 U/dL

## 2018-06-25 NOTE — Progress Notes (Signed)
Urine specimen s/p treatment for UTI is negative. Pt feels better.

## 2018-06-25 NOTE — Patient Instructions (Signed)
Urine specimen normal s/p treatment for UTI. See urologist for evaluation of frequent infections.

## 2018-07-20 ENCOUNTER — Other Ambulatory Visit: Payer: Self-pay | Admitting: *Deleted

## 2018-07-20 ENCOUNTER — Ambulatory Visit (INDEPENDENT_AMBULATORY_CARE_PROVIDER_SITE_OTHER): Payer: BLUE CROSS/BLUE SHIELD | Admitting: Neurology

## 2018-07-20 DIAGNOSIS — R200 Anesthesia of skin: Secondary | ICD-10-CM

## 2018-07-20 NOTE — Procedures (Signed)
[facility]  [address]  [place], [place] [address] Tel: [phone] Fax:  [phone] Test Date:  07/20/2018  Patient: Crystal [name] DOB: [birth year] Physician: Narda Amber, DO  Sex: Female Height: 5\' 9"  Ref Phys: Narda Amber, DO  ID#: [id number] Temp: 33.0C Technician:    Patient Complaints: This is a 68 year old female referred for evaluation of bilateral feet numbness.  NCV & EMG Findings: Electrodiagnostic testing of the right lower extremity and additional studies of the left shows: 1. Bilateral sural and superficial peroneal sensory responses are within normal limits. 2. Bilateral peroneal motor responses at the extensor digitorum brevis are reduced, and normal at the tibialis anterior.  Bilateral tibial motor responses are within normal limits.   3. Bilateral tibial H reflex studies are within normal limits. 4. There is no evidence of active or chronic motor axonal changes affecting any of the tested muscles.  Motor unit configuration and recruitment pattern is within normal limits.   Impression: This is a normal study of the lower extremities.  In particular, there is no evidence of a sensorimotor polyneuropathy or lumbosacral radiculopathy.     ___________________________ Narda Amber, DO    Nerve Conduction Studies Anti Sensory Summary Table   Site NR Peak (ms) Norm Peak (ms) P-T Amp (V) Norm P-T Amp  Left Sup Peroneal Anti Sensory (Ant Lat Mall)  33C  12 cm    2.4 <4.6 7.7 >3  Right Sup Peroneal Anti Sensory (Ant Lat Mall)  33C  12 cm    2.5 <4.6 7.2 >3  Left Sural Anti Sensory (Lat Mall)  33C  Calf    4.1 <4.6 10.7 >3  Right Sural Anti Sensory (Lat Mall)  33C  Calf    3.5 <4.6 11.1 >3   Motor Summary Table   Site NR Onset (ms) Norm Onset (ms) O-P Amp (mV) Norm O-P Amp Site1 Site2 Delta-0 (ms) Dist (cm) Vel (m/s) Norm Vel (m/s)  Left Peroneal Motor (Ext Dig Brev)  33C  Ankle    3.0 <6.0 1.3 >2.5 B Fib Ankle 9.4 38.0 40  >40  B Fib    12.4  1.1  Poplt B Fib 1.6 8.0 50 >40  Poplt    14.0  1.0         Right Peroneal Motor (Ext Dig Brev)  33C  Ankle    4.9 <6.0 2.0 >2.5 B Fib Ankle 9.1 41.0 45 >40  B Fib    14.0  2.0  Poplt B Fib 2.0 8.0 40 >40  Poplt    16.0  1.8         Left Peroneal TA Motor (Tib Ant)  33C  Fib Head    3.6 <4.5 4.3 >3 Poplit Fib Head 1.2 8.0 67 >40  Poplit    4.8  3.9         Right Peroneal TA Motor (Tib Ant)  33C  Fib Head    2.8 <4.5 4.4 >3 Poplit Fib Head 1.3 8.0 62 >40  Poplit    4.1  4.3         Left Tibial Motor (Abd Hall Brev)  33C  Ankle    4.1 <6.0 8.5 >4 Knee Ankle 9.4 44.0 47 >40  Knee    13.5  6.0         Right Tibial Motor (Abd Hall Brev)  33C  Ankle    4.5 <6.0 10.1 >4 Knee Ankle 10.0 44.0 44 >40  Knee  14.5  8.7          H Reflex Studies   NR H-Lat (ms) Lat Norm (ms) L-R H-Lat (ms)  Left Tibial (Gastroc)  33C     34.83 <35 2.18  Right Tibial (Gastroc)  33C     32.65 <35 2.18   EMG   Side Muscle Ins Act Fibs Psw Fasc Number Recrt Dur Dur. Amp Amp. Poly Poly. Comment  Left AntTibialis Nml Nml Nml Nml Nml Nml Nml Nml Nml Nml Nml Nml N/A  Left Gastroc Nml Nml Nml Nml Nml Nml Nml Nml Nml Nml Nml Nml N/A  Left Flex Dig Long Nml Nml Nml Nml Nml Nml Nml Nml Nml Nml Nml Nml N/A  Left RectFemoris Nml Nml Nml Nml Nml Nml Nml Nml Nml Nml Nml Nml N/A  Left GluteusMed Nml Nml Nml Nml Nml Nml Nml Nml Nml Nml Nml Nml N/A  Right AntTibialis Nml Nml Nml Nml Nml Nml Nml Nml Nml Nml Nml Nml N/A  Right Gastroc Nml Nml Nml Nml Nml Nml Nml Nml Nml Nml Nml Nml N/A  Right Flex Dig Long Nml Nml Nml Nml Nml Nml Nml Nml Nml Nml Nml Nml N/A  Right RectFemoris Nml Nml Nml Nml Nml Nml Nml Nml Nml Nml Nml Nml N/A  Right GluteusMed Nml Nml Nml Nml Nml Nml Nml Nml Nml Nml Nml Nml N/A      Waveforms:

## 2018-07-21 ENCOUNTER — Encounter: Payer: Self-pay | Admitting: *Deleted

## 2018-07-21 ENCOUNTER — Telehealth: Payer: Self-pay | Admitting: *Deleted

## 2018-07-21 NOTE — Telephone Encounter (Signed)
Results and instructions sent to patient via My Chart.

## 2018-07-21 NOTE — Telephone Encounter (Signed)
-----   Message from Alda Berthold, DO sent at 07/21/2018  9:01 AM EST ----- Please inform patient that her nerve testing is normal.  Recommend scheduling a follow-up visit, if she would like to discuss. Thanks.

## 2018-08-23 ENCOUNTER — Encounter: Payer: Self-pay | Admitting: Internal Medicine

## 2018-08-23 ENCOUNTER — Ambulatory Visit: Payer: BLUE CROSS/BLUE SHIELD | Admitting: Internal Medicine

## 2018-08-23 VITALS — BP 118/72 | HR 83 | Temp 98.5°F | Ht 69.0 in | Wt 158.0 lb

## 2018-08-23 DIAGNOSIS — T3995XA Adverse effect of unspecified nonopioid analgesic, antipyretic and antirheumatic, initial encounter: Secondary | ICD-10-CM | POA: Diagnosis not present

## 2018-08-23 DIAGNOSIS — M549 Dorsalgia, unspecified: Secondary | ICD-10-CM | POA: Insufficient documentation

## 2018-08-23 DIAGNOSIS — G4440 Drug-induced headache, not elsewhere classified, not intractable: Secondary | ICD-10-CM | POA: Diagnosis not present

## 2018-08-23 NOTE — Patient Instructions (Signed)
Shoulder Range of Motion Exercises Shoulder range of motion (ROM) exercises are done to keep the shoulder moving freely or to increase movement. They are often recommended for people who have shoulder pain or stiffness or who are recovering from a shoulder surgery. Phase 1 exercises When you are able, do this exercise 1-2 times per day for 30-60 seconds in each direction, or as directed by your health care provider. Pendulum exercise To do this exercise while sitting: 1. Sit in a chair or at the edge of your bed with your feet flat on the floor. 2. Let your affected arm hang down in front of you over the edge of the bed or chair. 3. Relax your shoulder, arm, and hand. 4. Rock your body so your arm gently swings in small circles. You can also use your unaffected arm to start the motion. 5. Repeat changing the direction of the circles, swinging your arm left and right, and swinging your arm forward and back. To do this exercise while standing: 1. Stand next to a sturdy chair or table, and hold on to it with your hand on your unaffected side. 2. Bend forward at the waist. 3. Bend your knees slightly. 4. Relax your shoulder, arm, and hand. 5. While keeping your shoulder relaxed, use body motion to swing your arm in small circles. 6. Repeat changing the direction of the circles, swinging your arm left and right, and swinging your arm forward and back. 7. Between exercises, stand up tall and take a short break to relax your lower back.  Phase 2 exercises Do these exercises 1-2 times per day or as told by your health care provider. Hold each stretch for 30 seconds, and repeat 3 times. Do the exercises with one or both arms as instructed by your health care provider. For these exercises, sit at a table with your hand and arm supported by the table. A chair that slides easily or has wheels can be helpful. External rotation 1. Turn your chair so that your affected side is nearest to the  table. 2. Place your forearm on the table to your side. Bend your elbow about 90 at the elbow (right angle) and place your hand palm facing down on the table. Your elbow should be about 6 inches away from your side. 3. Keeping your arm on the table, lean your body forward. Abduction 1. Turn your chair so that your affected side is nearest to the table. 2. Place your forearm and hand on the table so that your thumb points toward the ceiling and your arm is straight out to your side. 3. Slide your hand out to the side and away from you, using your unaffected arm to do the work. 4. To increase the stretch, you can slide your chair away from the table. Flexion: forward stretch 1. Sit facing the table. Place your hand and elbow on the table in front of you. 2. Slide your hand forward and away from you, using your unaffected arm to do the work. 3. To increase the stretch, you can slide your chair backward. Phase 3 exercises Do these exercises 1-2 times per day or as told by your health care provider. Hold each stretch for 30 seconds, and repeat 3 times. Do the exercises with one or both arms as instructed by your health care provider. Cross-body stretch: posterior capsule stretch 1. Lift your arm straight out in front of you. 2. Bend your arm 90 at the elbow (right angle) so your forearm   moves across your body. 3. Use your other arm to gently pull the elbow across your body, toward your other shoulder. Wall climbs 1. Stand with your affected arm extended out to the side with your hand resting on a door frame. 2. Slide your hand slowly up the door frame. 3. To increase the stretch, step through the door frame. Keep your body upright and do not lean. Wand exercises You will need a cane, a piece of PVC pipe, or a sturdy wooden dowel for wand exercises. Flexion To do this exercise while standing: 1. Hold the wand with both of your hands, palms down. 2. Using the other arm to help, lift your arms  up and over your head, if able. 3. Push upward with your other arm to gently increase the stretch. To do this exercise while lying down: 1. Lie on your back with your elbows resting on the floor and the wand in both your hands. Your hands will be palm down, or pointing toward your feet. 2. Lift your hands toward the ceiling, using your unaffected arm to help if needed. 3. Bring your arms overhead as able, using your unaffected arm to help if needed. Internal rotation 1. Stand while holding the wand behind you with both hands. Your unaffected arm should be extended above your head with the arm of the affected side extended behind you at the level of your waist. The wand should be pointing straight up and down as you hold it. 2. Slowly pull the wand up behind your back by straightening the elbow of your unaffected arm and bending the elbow of your affected arm. External rotation 1. Lie on your back with your affected upper arm supported on a small pillow or rolled towel. When you first do this exercise, keep your upper arm close to your body. Over time, bring your arm up to a 90 angle out to the side. 2. Hold the wand across your stomach and with both hands palm up. Your elbow on your affected side should be bent at a 90 angle. 3. Use your unaffected side to help push your forearm away from you and toward the floor. Keep your elbow on your affected side bent at a 90 angle. Contact a health care provider if you have:  New or increasing pain.  New numbness, tingling, weakness, or discoloration in your arm or hand. This information is not intended to replace advice given to you by your health care provider. Make sure you discuss any questions you have with your health care provider. Document Released: 03/15/2003 Document Revised: 07/29/2017 Document Reviewed: 07/29/2017 Elsevier Interactive Patient Education  2019 [employer].   Analgesic Rebound Headache An analgesic rebound headache,  sometimes called a medication overuse headache, is a headache that comes after pain medicine (analgesic) taken to treat the original (primary) headache has worn off. Any type of primary headache can return as a rebound headache if a person regularly takes analgesics more than three times a week to treat it. The types of primary headaches that are commonly associated with rebound headaches include:  Migraines.  Headaches that arise from tense muscles in the head and neck area (tension headaches).  Headaches that develop and happen again (recur) on one side of the head and around the eye (cluster headaches). If rebound headaches continue, they become chronic daily headaches. What are the causes? This condition may be caused by frequent use of:  Over-the-counter medicines such as aspirin, ibuprofen, and acetaminophen.  Sinus relief medicines  and other medicines that contain caffeine.  Narcotic pain medicines such as codeine and oxycodone. What are the signs or symptoms? The symptoms of a rebound headache are the same as the symptoms of the original headache. Some of the symptoms of specific types of headaches include: Migraine headache  Pulsing or throbbing pain on one or both sides of the head.  Severe pain that interferes with daily activities.  Pain that is worsened by physical activity.  Nausea, vomiting, or both.  Pain with exposure to bright light, loud noises, or strong smells.  General sensitivity to bright light, loud noises, or strong smells.  Visual changes.  Numbness of one or both arms. Tension headache  Pressure around the head.  Dull, aching head pain.  Pain felt over the front and sides of the head.  Tenderness in the muscles of the head, neck, and shoulders. Cluster headache  Severe pain that begins in or around one eye or temple.  Redness and tearing in the eye on the same side as the pain.  Droopy or swollen eyelid.  One-sided head  pain.  Nausea.  Runny nose.  Sweaty, pale facial skin.  Restlessness. How is this diagnosed? This condition is diagnosed by:  Reviewing your medical history. This includes the nature of your primary headaches.  Reviewing the types of pain medicines that you have been using to treat your headaches and how often you take them. How is this treated? This condition may be treated or managed by:  Discontinuing frequent use of the analgesic medicine. Doing this may worsen your headaches at first, but the pain should eventually become more manageable, less frequent, and less severe.  Seeing a headache specialist. He or she may be able to help you manage your headaches and help make sure there is not another cause of the headaches.  Using methods of stress relief, such as acupuncture, counseling, biofeedback, and massage. Talk with your health care provider about which methods might be good for you. Follow these instructions at home:  Take over-the-counter and prescription medicines only as told by your health care provider.  Stop the repeated use of pain medicine as told by your health care provider. Stopping can be difficult. Carefully follow instructions from your health care provider.  Avoid triggers that are known to cause your primary headaches.  Keep all follow-up visits as told by your health care provider. This is important. Contact a health care provider if:  You continue to experience headaches after following treatments that your health care provider recommended. Get help right away if:  You develop new headache pain.  You develop headache pain that is different than what you have experienced in the past.  You develop numbness or tingling in your arms or legs.  You develop changes in your speech or vision. This information is not intended to replace advice given to you by your health care provider. Make sure you discuss any questions you have with your health care  provider. Document Released: 09/06/2003 Document Revised: 01/04/2016 Document Reviewed: 11/19/2015 Elsevier Interactive Patient Education  2019 [employer].

## 2018-08-23 NOTE — Assessment & Plan Note (Signed)
She has been headache prone over the years. She is taking advil several times per day for weeks and we talked about likely analgesic rebound and discussed other strategies for headache management to help break this cycle. Imaging not indicated today and not typical for temporal arteritis.

## 2018-08-23 NOTE — Progress Notes (Signed)
   Subjective:   Patient ID: Crystal [name], female    DOB: 1950/08/18, 68 y.o.   MRN: [id number]  HPI The patient is a 68 YO female coming in for upper back pain (started about 5 weeks ago, denies injury or overuse at onset, overall it is improving gradually, does yoga and was working with them to try some stretching, taking advil for headaches which helps some, also using heat which helps some, denies pain or numbness in the arms, no radiation of the pain, worse on the right around the scapula, mild on the left) and headaches (started about 6 weeks ago, right around the eyebrow, she denies current sinus symptoms, mild post nasal drip but not bad, no ear pain, no vision changes or double or blurred vision, takes 1 advil which helps, doing this 1-2 times per day for the last several weeks, feels like mild overall and 4/10 intensity, overall it is stable, denies numbness or weakness, no tenderness anywhere else on her head, has tried advil with success).   Review of Systems  Constitutional: Negative for activity change, appetite change, chills, fatigue, fever and unexpected weight change.  HENT: Positive for postnasal drip. Negative for congestion, ear discharge, ear pain, nosebleeds, sinus pressure, sneezing, sore throat and trouble swallowing.   Eyes: Negative.   Respiratory: Negative.   Cardiovascular: Negative.   Gastrointestinal: Negative.   Musculoskeletal: Positive for arthralgias, back pain and myalgias. Negative for gait problem, joint swelling, neck pain and neck stiffness.  Skin: Negative.   Neurological: Positive for headaches.  Psychiatric/Behavioral: Negative.     Objective:  Physical Exam Constitutional:      Appearance: She is well-developed.  HENT:     Head: Normocephalic and atraumatic.     Comments: No temporal tenderness Neck:     Musculoskeletal: Normal range of motion.  Cardiovascular:     Rate and Rhythm: Normal rate and regular rhythm.  Pulmonary:     Effort:  Pulmonary effort is normal. No respiratory distress.     Breath sounds: Normal breath sounds. No wheezing or rales.  Abdominal:     General: Bowel sounds are normal. There is no distension.     Palpations: Abdomen is soft.     Tenderness: There is no abdominal tenderness. There is no rebound.  Musculoskeletal:        General: Tenderness present.     Comments: Tenderness to palpation over the right scapula  Skin:    General: Skin is warm and dry.  Neurological:     Mental Status: She is alert and oriented to person, place, and time.     Coordination: Coordination normal.     Vitals:   08/23/18 0800  BP: 118/72  Pulse: 83  Temp: 98.5 F (36.9 C)  TempSrc: Oral  SpO2: 99%  Weight: 158 lb (71.7 kg)  Height: 5\' 9"  (1.753 m)    Assessment & Plan:

## 2018-08-23 NOTE — Assessment & Plan Note (Addendum)
Given stretching exercises which should help. We talked about muscle strain as likely diagnosis. She does have a grandchild that could have caused strain with lifting over the holidays. No red flag signs to indicate need for imaging today.

## 2018-08-28 NOTE — Progress Notes (Signed)
Follow-up Visit   Date: 09/01/18   Crystal [name] MRN: [id number] DOB: 1950-07-14   Interim History: Crystal [name] is a 68 y.o. right-handed Caucasian female with right breast cancer s/p radiation (2016)  returning to the clinic for follow-up of bilateral toe numbness/tingling.  The patient was accompanied to the clinic by self.  History of present illness: Starting around 2011, she started having tingling of the tips of her toes.  She saw Dr. Linda Hedges and was told she may have early signs of neuropathy.  During the summer of 2018, she starting having burning sensation over the toes and the pad of the feet.   She recalls having to take her shoes off her feet because of severe pain.  Lately, she does not have severe pain, but continues to have tingling and numbness of the toes, which is worse on the left.  She denies any imbalance, falls, or weakness.   She denies any history of diabetes mellitus, alcohol abuse, or family history neuropathy.   UPDATE 09/01/2018:  She had normal EDX of the legs performed in January. She continues to have tingling over over the balls of the feet and tips of the toes, which is worse in the evening.  Symptoms are not painful, but moreso annoying.  She does not have weakness or imbalance, but is aware that certain poses in yoga are more difficult.  She continues to work and is otherwise very healthy.    Medications:  Current Outpatient Medications on File Prior to Visit  Medication Sig Dispense Refill  . alendronate (FOSAMAX) 70 MG tablet take 1 tablet by mouth every week on an empty stomach with A 12 tablet 3  . anastrozole (ARIMIDEX) 1 MG tablet Take 1 tablet (1 mg total) by mouth daily. 90 tablet 3  . Calcium Carbonate-Vitamin D (CALCIUM 600-D) 600-400 MG-UNIT per tablet Take 1 tablet by mouth daily.     . cetirizine (ZYRTEC) 10 MG chewable tablet Chew 10 mg by mouth daily.    . Cholecalciferol (VITAMIN D3) 1000 UNITS CAPS Take by mouth daily.        Marland Kitchen ibuprofen (ADVIL,MOTRIN) 200 MG tablet Take 200-400 mg by mouth daily as needed for headache.    . Multiple Vitamin (MULTIVITAMIN) tablet Take 1 tablet by mouth daily.      Vladimir Faster Glycol-Propyl Glycol (SYSTANE OP) Apply 1 drop to eye as needed (For dryness.).    Marland Kitchen Probiotic Product (FORTIFY DAILY PROBIOTIC PO) Take 550 mg by mouth daily.    Marland Kitchen trimethoprim (TRIMPEX) 100 MG tablet     . TURMERIC PO Take by mouth.     No current facility-administered medications on file prior to visit.     Allergies: No Known Allergies  Review of Systems:  CONSTITUTIONAL: No fevers, chills, night sweats, or weight loss.  EYES: No visual changes or eye pain ENT: No hearing changes.  No history of nose bleeds.   RESPIRATORY: No cough, wheezing and shortness of breath.   CARDIOVASCULAR: Negative for chest pain, and palpitations.   GI: Negative for abdominal discomfort, blood in stools or black stools.  No recent change in bowel habits.   GU:  No history of incontinence.   MUSCLOSKELETAL: No history of joint pain or swelling.  No myalgias.   SKIN: Negative for lesions, rash, and itching.   ENDOCRINE: Negative for cold or heat intolerance, polydipsia or goiter.   PSYCH:  No depression or anxiety symptoms.   NEURO: As Above.  Vital Signs:  BP 118/80   Pulse 82   Ht '5\' 9"'  (1.753 m)   Wt 157 lb 2 oz (71.3 kg)   SpO2 94%   BMI 23.20 kg/m   Neurological Exam: MENTAL STATUS including orientation to time, place, person, recent and remote memory, attention span and concentration, language, and fund of knowledge is normal.  Speech is not dysarthric.  CRANIAL NERVES:  No visual field defects.  Pupils equal round and reactive to light.  Normal conjugate, extra-ocular eye movements in all directions of gaze.  No ptosis.  Face is symmetric. Palate elevates symmetrically.  Tongue is midline.  MOTOR:  Motor strength is 5/5 in all extremities.  No atrophy, fasciculations or abnormal movements.  No pronator  drift.  Tone is normal.    MSRs:  Reflexes are 2+/4 throughout .  SENSORY:  Intact to vibration throughout.  COORDINATION/GAIT:  Normal finger-to- nose-finger.  Gait narrow based and stable. Tandem gait intact.  Data: NCS/EMG of the legs 07/20/2018:  This is a normal study of the lower extremities.  In particular, there is no evidence of a sensorimotor polyneuropathy or lumbosacral radiculopathy.    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  Although her electrodiagnostic testing is normal, her history is most suggestive of a distal sensory neuropathy.  I explained that with her symptoms being distal to the recording electrode, NCS may not be sensitive enough to show signs of neuropathy.  Alternatively, she may have small fiber neuropathy which is diagnosed by skin biopsy.    Neuropathy tends to be very slowly progressive, especially if a treatable etiology is not identified.  Her labs have all returned normal.  discussed that in the vast majority of cases, despite checking for reversible causes, we are unable to find the underlying etiology and management is symptomatic.  Fortunately, she does not have pain and is more annoyed by the toe numbness, for which there is no treatment.  If symptoms get worse, we can repeat NCS or consider skin biopsy.  Return to clinic in 1 year.   Thank you for allowing me to participate in patient's care.  If I can answer any additional questions, I would be pleased to do so.    Sincerely,    Winner Valeriano K. Posey Pronto, DO

## 2018-09-01 ENCOUNTER — Ambulatory Visit: Payer: BLUE CROSS/BLUE SHIELD | Admitting: Neurology

## 2018-09-01 ENCOUNTER — Encounter: Payer: Self-pay | Admitting: Neurology

## 2018-09-01 VITALS — BP 118/80 | HR 82 | Ht 69.0 in | Wt 157.1 lb

## 2018-09-01 DIAGNOSIS — G629 Polyneuropathy, unspecified: Secondary | ICD-10-CM

## 2018-09-01 NOTE — Patient Instructions (Addendum)
Return to clinic in 1 year.

## 2018-09-07 ENCOUNTER — Ambulatory Visit: Payer: BLUE CROSS/BLUE SHIELD | Admitting: Internal Medicine

## 2018-09-07 ENCOUNTER — Ambulatory Visit (INDEPENDENT_AMBULATORY_CARE_PROVIDER_SITE_OTHER)
Admission: RE | Admit: 2018-09-07 | Discharge: 2018-09-07 | Disposition: A | Discharge status: Home / Self Care | Payer: BLUE CROSS/BLUE SHIELD | Source: Ambulatory Visit | Attending: Internal Medicine | Admitting: Internal Medicine

## 2018-09-07 ENCOUNTER — Encounter: Payer: Self-pay | Admitting: Internal Medicine

## 2018-09-07 VITALS — BP 140/70 | HR 87 | Temp 98.5°F | Ht 69.0 in | Wt 156.0 lb

## 2018-09-07 DIAGNOSIS — M549 Dorsalgia, unspecified: Secondary | ICD-10-CM

## 2018-09-07 NOTE — Progress Notes (Signed)
   Subjective:   Patient ID: Crystal [name], female    DOB: 01-14-1951, 68 y.o.   MRN: [id number]  HPI The patient is a 68 YO female coming in for right scapular pain. Started about 2 months ago and overall is better than then. She is still having pain with certain movements. Having a problem lifting her grandchild (13 pounds). Denies numbness or pain down the arms. Denies neck pain. No new headaches. Hx breast cancer.   Review of Systems  Constitutional: Negative.   HENT: Negative.   Eyes: Negative.   Respiratory: Negative for cough, chest tightness and shortness of breath.   Cardiovascular: Negative for chest pain, palpitations and leg swelling.  Gastrointestinal: Negative for abdominal distention, abdominal pain, constipation, diarrhea, nausea and vomiting.  Musculoskeletal: Positive for back pain and myalgias.  Skin: Negative.   Neurological: Negative.   Psychiatric/Behavioral: Negative.     Objective:  Physical Exam Constitutional:      Appearance: She is well-developed.  HENT:     Head: Normocephalic and atraumatic.  Neck:     Musculoskeletal: Normal range of motion.  Cardiovascular:     Rate and Rhythm: Normal rate and regular rhythm.  Pulmonary:     Effort: Pulmonary effort is normal. No respiratory distress.     Breath sounds: Normal breath sounds. No wheezing or rales.  Abdominal:     General: Bowel sounds are normal. There is no distension.     Palpations: Abdomen is soft.     Tenderness: There is no abdominal tenderness. There is no rebound.  Musculoskeletal:        General: Tenderness present.     Comments: Less tenderness to palpation on the right scapula region than prior  Skin:    General: Skin is warm and dry.  Neurological:     Mental Status: She is alert and oriented to person, place, and time.     Coordination: Coordination normal.     Vitals:   09/07/18 1427  BP: 140/70  Pulse: 87  Temp: 98.5 F (36.9 C)  TempSrc: Oral  SpO2: 99%  Weight:  156 lb (70.8 kg)  Height: 5\' 9"  (1.753 m)    Assessment & Plan:

## 2018-09-07 NOTE — Assessment & Plan Note (Signed)
Given lack of improvement ordered x-ray thoracic to rule out compression fracture. Suspect muscular and will resolve. If x-ray normal will order PT for patient.

## 2018-09-07 NOTE — Patient Instructions (Signed)
We will get the x-ray today.

## 2018-09-13 ENCOUNTER — Telehealth: Payer: Self-pay

## 2018-09-13 DIAGNOSIS — M549 Dorsalgia, unspecified: Secondary | ICD-10-CM

## 2018-09-13 NOTE — Telephone Encounter (Signed)
Copied from [place] [id number]. Topic: General - Inquiry >> Sep 13, 2018  2:40 PM Virl Axe D wrote: Reason for CRM: Pt stated she is still having upper back pain and that Dr. Sharlet Salina mentioned doing physical therapy if her x-ray did not show anything. She would like to know what she needs to do get start with PT. Please advise. [phone]

## 2018-09-14 NOTE — Telephone Encounter (Signed)
Referral to PT placed

## 2018-09-14 NOTE — Telephone Encounter (Signed)
Patient informed order has been placed and she will get a call to schedule. Patient stated understanding

## 2018-09-14 NOTE — Addendum Note (Signed)
Addended by: Pricilla Holm A on: 09/14/2018 07:47 AM   Modules accepted: Orders

## 2018-10-15 ENCOUNTER — Ambulatory Visit: Payer: BLUE CROSS/BLUE SHIELD | Attending: Internal Medicine | Admitting: Physical Therapy

## 2018-10-15 ENCOUNTER — Other Ambulatory Visit: Payer: Self-pay

## 2018-10-15 ENCOUNTER — Encounter: Payer: Self-pay | Admitting: Physical Therapy

## 2018-10-15 DIAGNOSIS — M542 Cervicalgia: Secondary | ICD-10-CM | POA: Diagnosis present

## 2018-10-15 DIAGNOSIS — R293 Abnormal posture: Secondary | ICD-10-CM | POA: Insufficient documentation

## 2018-10-15 NOTE — Patient Instructions (Signed)
Access Code: [email or web address]  URL: [email or web address]  Date: 10/15/2018  Prepared by: Ruben Im   Exercises  Seated Thoracic Lumbar Extension with Pectoralis Stretch - 10 reps - 1 sets - 1x daily - 7x weekly  Seated Scapular Retraction - 10 reps - 1 sets - 1x daily - 7x weekly  Neck Retraction - 10 reps - 1 sets - 1x daily - 7x weekly  Seated Cervical Sidebending Stretch - 3 reps - 1 sets - 30 hold - 1x daily - 7x weekly      Ruben Im PT

## 2018-10-15 NOTE — Therapy (Signed)
[facility] [address] [place], [place], [address] Phone: [phone]   Fax:  [phone]  Physical Therapy Evaluation  Patient Details  Name: Crystal [name] MRN: [id number] Date of Birth: 05/03/1951 No data recorded  Encounter Date: 10/15/2018  PT End of Session - 10/15/18 1246    Visit Number  1    Date for PT Re-Evaluation  12/10/18    Authorization Type  [employer]    PT Start Time  1000    PT Stop Time  1045    PT Time Calculation (min)  45 min    Activity Tolerance  Patient tolerated treatment well       Past Medical History:  Diagnosis Date  . BACK PAIN, LUMBAR   . Breast cancer, right breast   . FIBROADENOMA, BREAST 2006  . PARESTHESIA    toes  . PONV (postoperative nausea and vomiting)   . Radiation 11/23/14-12/25/14   right breast 42.72 gray, lumpectomy cavity boosted to 54.72 gray  . Wears glasses     Past Surgical History:  Procedure Laterality Date  . atrophic vaginitis    . BREAST LUMPECTOMY WITH RADIOACTIVE SEED LOCALIZATION Right 10/18/2014   Procedure: BREAST LUMPECTOMY WITH RADIOACTIVE SEED LOCALIZATION;  Surgeon: Coralie Keens, MD;  Location: [facility];  Service: General;  Laterality: Right;  . COLONOSCOPY  09/05/2013  . D & C and hysteroscopy  11/1998   for uterine polyp  . DILATATION & CURRETTAGE/HYSTEROSCOPY WITH RESECTOCOPE N/A 05/30/2015   Procedure: DILATATION & CURETTAGE/HYSTEROSCOPY WITH RESECTOCOPE;  Surgeon: Princess Bruins, MD;  Location: [facility];  Service: Gynecology;  Laterality: N/A;  . DILATION AND CURETTAGE OF UTERUS    . Sterotactix needle biopsy  2000   breast calcification    There were no vitals filed for this visit.   Subjective Assessment - 10/15/18 1004    Subjective  January had sudden onset of right scapular pain with reaching and would radiate down right arm.   Better now just some nagging pain across neck.  Manage commercial office  buildings.      Pertinent History  osteopenia,  breast CA 2016, neuropathy    How long can you sit comfortably?  as long as I want    Diagnostic tests  x-rays     Patient Stated Goals  no pain across neck    Currently in Pain?  Yes    Pain Score  3     Pain Location  Neck    Pain Orientation  Right;Left    Aggravating Factors   pottery bending over at the wheel;  get dishes out of the cabinet; holding pocketbook          [facility] PT Assessment - 10/15/18 0001      Assessment   Medical Diagnosis  upper back pain    Next MD Visit  as needed    Prior Therapy  none      Precautions   Precautions  None      Restrictions   Weight Bearing Restrictions  No      Balance Screen   Has the patient fallen in the past 6 months  No    Has the patient had a decrease in activity level because of a fear of falling?   No    Is the patient reluctant to leave their home because of a fear of falling?   No      Home Environment   Living Environment  Private residence    Living Arrangements  Spouse/significant other    Type of [place]      Prior Function   Level of Independence  Independent    Vocation Requirements  manage commercial properties    Leisure  holding grandbaby; pottery; yoga      Observation/Other Assessments   Focus on Therapeutic Outcomes (FOTO)   29% limitation      Posture/Postural Control   Posture/Postural Control  Postural limitations    Postural Limitations  Increased thoracic kyphosis      AROM   Cervical Flexion  50    Cervical Extension  46    Cervical - Right Side Bend  40    Cervical - Left Side Bend  38    Cervical - Right Rotation  50    Cervical - Left Rotation  40    Thoracic Flexion  full    Thoracic Extension  0    Thoracic - Right Rotation  WFLs    Thoracic - Left Rotation  WFLs      Strength   Strength Assessment Site  Shoulder;Cervical;Thoracic    Right/Left Shoulder  --   4/5 periscapular muscles   Cervical Flexion  5/5    Cervical  Extension  4/5    Thoracic Flexion  5/5    Thoracic Extension  4/5                Objective measurements completed on examination: See above findings.              PT Education - 10/15/18 1244    Education Details  [id number]   thoracic extension, scapular retraction, cervical retraction, upper trap stretch ;  DN info    Person(s) Educated  Patient    Methods  Explanation;Demonstration    Comprehension  Returned demonstration;Verbalized understanding       PT Short Term Goals - 10/15/18 1254      PT SHORT TERM GOAL #1   Title  The patient will demonstrate good understanding of postural correction and frequent change of position at work or when using the [employer] wheel     Time  4    Period  Weeks    Status  New    Target Date  11/12/18      PT SHORT TERM GOAL #2   Title  The patient will report a 30% improvement in lower cervical, upper back pain level with ADLs    Time  4    Period  Weeks    Status  New      PT SHORT TERM GOAL #3   Title  Patient will have improved thoracic extension to 10 degrees and symmetrical cervical sidebending and rotation     Time  4    Period  Weeks    Status  New        PT Long Term Goals - 10/15/18 1257      PT LONG TERM GOAL #1   Title  The patient will be independent with a safe self progression of HEP    Time  8    Period  Weeks    Status  New    Target Date  12/10/18      PT LONG TERM GOAL #2   Title  The patient will report a 70% improvement in pain across lower cervical/upper back region with home and work ADLs    Time  8    Period  Weeks  Status  New      PT LONG TERM GOAL #3   Title  The patient will have 4+/5 periscapular and trunk extensor strength needed for endurance for upright posture and to hold grandbaby    Time  8    Period  Weeks    Status  New      PT LONG TERM GOAL #4   Title  FOTO functional outcome score improved from 29% limitation to 22%     Time  8    Period  Weeks    Status  New              Plan - 10/15/18 1246    Clinical Impression Statement  The patient reports the onset of right scapular region pain that radiated into her right upper arm in January for no apparent reason.  She reports the pain was aggravated with reaching overhead and with leaning over her pottery wheel.  She report the pain has improved quite a bit but she continues to have a "nagging" pain across both shoulders.  She has multiple tender points in bil upper traps, levator scap and right rhomboid muscles.  Increased thoracic kyphosis and forward head posture.  Minor movement limitations with cervical flexion and extension, sidebending and rotation.   Very limited thoracic extension ROM.   Decreased periscapular and trunk extensor strength 4/5.  She would benefit from PT to address these deficits.      Personal Factors and Comorbidities  Profession;Comorbidity 1    Comorbidities  osteopenia, history of LBP    Examination-Activity Limitations  Reach Overhead    Examination-Participation Restrictions  Community Activity    Stability/Clinical Decision Making  Stable/Uncomplicated    Clinical Decision Making  Low    Rehab Potential  Good    PT Frequency  1x / week    PT Duration  8 weeks    PT Treatment/Interventions  ADLs/Self Care Home Management;Electrical Stimulation;Cryotherapy;Moist Heat;Traction;Ultrasound;Therapeutic exercise;Therapeutic activities;Neuromuscular re-education;Patient/family education;Manual techniques;Dry needling;Taping    PT Next Visit Plan  DN to bil upper traps, levator, right rhomboid;  review HEP and add band ex's and/or prone lifts     Consulted and Agree with Plan of Care  Patient       Patient will benefit from skilled therapeutic intervention in order to improve the following deficits and impairments:  Pain, Postural dysfunction, Increased fascial restricitons, Decreased strength, Decreased range of motion  Visit Diagnosis: Cervicalgia - Plan: PT plan of care  cert/re-cert  Abnormal posture - Plan: PT plan of care cert/re-cert     Problem List Patient Active Problem List   Diagnosis Date Noted  . Upper back pain 08/23/2018  . Analgesic rebound headache 08/23/2018  . Breast cancer of lower-outer quadrant of right female breast ([place]) 10/24/2014  . Routine health maintenance 03/30/2013  . PARESTHESIA 06/25/2010   Ruben Im, PT 10/15/18 1:04 PM Phone: [phone] Fax: [phone]  Alvera Singh 10/15/2018, 1:03 PM   [facility] [address] [place], [place], [address] Phone: [phone]   Fax:  [phone]  Name: Crystal [name] MRN: [id number] Date of Birth: May 21, 1951

## 2018-10-19 ENCOUNTER — Ambulatory Visit: Payer: BLUE CROSS/BLUE SHIELD | Admitting: Physical Therapy

## 2018-10-19 ENCOUNTER — Other Ambulatory Visit: Payer: Self-pay

## 2018-10-19 DIAGNOSIS — M542 Cervicalgia: Secondary | ICD-10-CM | POA: Diagnosis not present

## 2018-10-19 DIAGNOSIS — R293 Abnormal posture: Secondary | ICD-10-CM

## 2018-10-19 NOTE — Patient Instructions (Addendum)
Over Head Pull: Narrow Grip And Wide Grip      On back, knees bent, feet flat, band across thighs, elbows straight but relaxed. Pull hands apart (start). Keeping elbows straight, bring arms up and over head, hands toward floor. Keep pull steady on band. Hold momentarily. Return slowly, keeping pull steady, back to start. Repeat __10_ times. Band color _red_____   Side Pull: Double Arm   On back, knees bent, feet flat. Arms perpendicular to body, shoulder level, elbows straight but relaxed. Pull arms out to sides, elbows straight. Resistance band comes across collarbones, hands toward floor. Hold momentarily. Slowly return to starting position. Repeat __10_ times. Band color __red___   Elmer Picker   On back, knees bent, feet flat, left hand on left hip, right hand above left. Pull right arm DIAGONALLY (hip to shoulder) across chest. Bring right arm along head toward floor. Hold momentarily. Slowly return to starting position. Repeat _10__ times. Do with left arm. Band color __red____   Shoulder Rotation: Double Arm and single arm   On back, knees bent, feet flat, elbows tucked at sides, bent 90, hands palms up. Pull hands apart and down toward floor, keeping elbows near sides. Hold momentarily. Slowly return to starting position. Repeat _10__ times. Band color ___red ___      Trigger Point Dry Needling  . What is Trigger Point Dry Needling (DN)? o DN is a physical therapy technique used to treat muscle pain and dysfunction. Specifically, DN helps deactivate muscle trigger points (muscle knots).  o A thin filiform needle is used to penetrate the skin and stimulate the underlying trigger point. The goal is for a local twitch response (LTR) to occur and for the trigger point to relax. No medication of any kind is injected during the procedure.   . What Does Trigger Point Dry Needling Feel Like?  o The procedure feels different for each individual patient. Some patients report that  they do not actually feel the needle enter the skin and overall the process is not painful. Very mild bleeding may occur. However, many patients feel a deep cramping in the muscle in which the needle was inserted. This is the local twitch response.   Marland Kitchen How Will I feel after the treatment? o Soreness is normal, and the onset of soreness may not occur for a few hours. Typically this soreness does not last longer than two days.  o Bruising is uncommon, however; ice can be used to decrease any possible bruising.  o In rare cases feeling tired or nauseous after the treatment is normal. In addition, your symptoms may get worse before they get better, this period will typically not last longer than 24 hours.   . What Can I do After My Treatment? o Increase your hydration by drinking more water for the next 24 hours. o You may place ice or heat on the areas treated that have become sore, however, do not use heat on inflamed or bruised areas. Heat often brings more relief post needling. o You can continue your regular activities, but vigorous activity is not recommended initially after the treatment for 24 hours. o DN is best combined with other physical therapy such as strengthening, stretching, and other therapies.      Ruben Im PT [facility] [address] [place], [place] [address] Phone # [phone] Fax [phone]   The Original [employer]enzie Lumbar Roll by [employer] for Art gallery manager

## 2018-10-19 NOTE — Therapy (Signed)
[facility] [address] [place], [place], [address] Phone: [phone]   Fax:  [phone]  Physical Therapy Treatment  Patient Details  Name: Crystal [name] MRN: [id number] Date of Birth: 1951/03/01 No data recorded  Encounter Date: 10/19/2018  PT End of Session - 10/19/18 1246    Visit Number  2    Date for PT Re-Evaluation  12/10/18    Authorization Type  BCBS    PT Start Time  1200    PT Stop Time  1243    PT Time Calculation (min)  43 min    Activity Tolerance  Patient tolerated treatment well       Past Medical History:  Diagnosis Date  . BACK PAIN, LUMBAR   . Breast cancer, right breast   . FIBROADENOMA, BREAST 2006  . PARESTHESIA    toes  . PONV (postoperative nausea and vomiting)   . Radiation 11/23/14-12/25/14   right breast 42.72 gray, lumpectomy cavity boosted to 54.72 gray  . Wears glasses     Past Surgical History:  Procedure Laterality Date  . atrophic vaginitis    . BREAST LUMPECTOMY WITH RADIOACTIVE SEED LOCALIZATION Right 10/18/2014   Procedure: BREAST LUMPECTOMY WITH RADIOACTIVE SEED LOCALIZATION;  Surgeon: Coralie Keens, MD;  Location: [facility];  Service: General;  Laterality: Right;  . COLONOSCOPY  09/05/2013  . D & C and hysteroscopy  11/1998   for uterine polyp  . DILATATION & CURRETTAGE/HYSTEROSCOPY WITH RESECTOCOPE N/A 05/30/2015   Procedure: DILATATION & CURETTAGE/HYSTEROSCOPY WITH RESECTOCOPE;  Surgeon: Princess Bruins, MD;  Location: [facility];  Service: Gynecology;  Laterality: N/A;  . DILATION AND CURETTAGE OF UTERUS    . Sterotactix needle biopsy  2000   breast calcification    There were no vitals filed for this visit.  Subjective Assessment - 10/19/18 1200    Subjective  I've been doing my exercises.  The pain is about the same.      Currently in Pain?  Yes    Pain Score  3     Pain Location  Neck    Pain Orientation  Right;Left                        [facility] Adult PT Treatment/Exercise - 10/19/18 0001      Therapeutic Activites    ADL's  lumbar roll for office chair      Neck Exercises: Supine   Other Supine Exercise  review of initial HEP    Other Supine Exercise  red band scapular series:shoulder flexion narrow and wide grip, horizontal abduction, sash, external rotation 10x each    discussion of progression to standing against the wall     Moist Heat Therapy   Number Minutes Moist Heat  5 Minutes    Moist Heat Location  Cervical      Manual Therapy   Soft tissue mobilization  bil cervical paraspinals, upper traps, right rhomboids       Trigger Point Dry Needling - 10/19/18 0001    Consent Given?  No    Education Handout Provided  Yes    Muscles Treated Head and Neck  Upper trapezius;Levator scapulae;Cervical multifidi    Muscles Treated Upper Quadrant  Rhomboids    Upper Trapezius Response  Twitch reponse elicited;Palpable increased muscle length    Levator Scapulae Response  Palpable increased muscle length    Cervical multifidi Response  Palpable increased muscle length  Rhomboids Response  Palpable increased muscle length           PT Education - 10/19/18 1227    Education Details  supine band ex; dry needling after care; lumbar roll for office chair    Person(s) Educated  Patient    Methods  Explanation;Demonstration;Handout    Comprehension  Returned demonstration;Verbalized understanding       PT Short Term Goals - 10/15/18 1254      PT SHORT TERM GOAL #1   Title  The patient will demonstrate good understanding of postural correction and frequent change of position at work or when using the [employer] wheel     Time  4    Period  Weeks    Status  New    Target Date  11/12/18      PT SHORT TERM GOAL #2   Title  The patient will report a 30% improvement in lower cervical, upper back pain level with ADLs    Time  4    Period  Weeks    Status  New      PT SHORT TERM  GOAL #3   Title  Patient will have improved thoracic extension to 10 degrees and symmetrical cervical sidebending and rotation     Time  4    Period  Weeks    Status  New        PT Long Term Goals - 10/15/18 1257      PT LONG TERM GOAL #1   Title  The patient will be independent with a safe self progression of HEP    Time  8    Period  Weeks    Status  New    Target Date  12/10/18      PT LONG TERM GOAL #2   Title  The patient will report a 70% improvement in pain across lower cervical/upper back region with home and work ADLs    Time  8    Period  Weeks    Status  New      PT LONG TERM GOAL #3   Title  The patient will have 4+/5 periscapular and trunk extensor strength needed for endurance for upright posture and to hold grandbaby    Time  8    Period  Weeks    Status  New      PT LONG TERM GOAL #4   Title  FOTO functional outcome score improved from 29% limitation to 22%     Time  8    Period  Weeks    Status  New            Plan - 10/19/18 1247    Clinical Impression Statement  The patient has decreased overall tender point size and number following manual therapy and DN.  Good initial response to HEP and receptive to instruction in initial  scapular strengthening in supine.  Discussed with patient that she should be ready for progression to standing against the wall by the end of the week.  Therapist closely monitoring response with all treatment interventions.      Rehab Potential  Good    PT Frequency  1x / week    PT Duration  8 weeks    PT Treatment/Interventions  ADLs/Self Care Home Management;Electrical Stimulation;Cryotherapy;Moist Heat;Traction;Ultrasound;Therapeutic exercise;Therapeutic activities;Neuromuscular re-education;Patient/family education;Manual techniques;Dry needling;Taping    PT Next Visit Plan  assess response to DN to bil upper traps, levator, right rhomboid #1;   add prone lifts  Patient will benefit from skilled therapeutic  intervention in order to improve the following deficits and impairments:  Pain, Postural dysfunction, Increased fascial restricitons, Decreased strength, Decreased range of motion  Visit Diagnosis: Cervicalgia  Abnormal posture     Problem List Patient Active Problem List   Diagnosis Date Noted  . Upper back pain 08/23/2018  . Analgesic rebound headache 08/23/2018  . Breast cancer of lower-outer quadrant of right female breast ([place]) 10/24/2014  . Routine health maintenance 03/30/2013  . PARESTHESIA 06/25/2010   Ruben Im, PT 10/19/18 12:51 PM Phone: [phone] Fax: [phone] Alvera Singh 10/19/2018, 12:51 PM  [facility] [address], [place], [place], [address] Phone: [phone]   Fax:  [phone]  Name: MURNA BACKER MRN: [id number] Date of Birth: Jan 04, 1951

## 2018-10-26 ENCOUNTER — Ambulatory Visit: Payer: BLUE CROSS/BLUE SHIELD | Admitting: Physical Therapy

## 2018-10-26 ENCOUNTER — Encounter: Payer: Self-pay | Admitting: Physical Therapy

## 2018-10-26 ENCOUNTER — Other Ambulatory Visit: Payer: Self-pay

## 2018-10-26 DIAGNOSIS — M542 Cervicalgia: Secondary | ICD-10-CM | POA: Diagnosis not present

## 2018-10-26 DIAGNOSIS — R293 Abnormal posture: Secondary | ICD-10-CM

## 2018-10-26 NOTE — Patient Instructions (Signed)
Ruben Im PT [facility] [address], [place], [place] [address] Phone # [phone] Fax [phone]   Progress to holding soup cans, then 2 or 3 pound weights.  You could also progress by putting a weight on your upper back.

## 2018-10-26 NOTE — Therapy (Signed)
[facility] [address], [place], [place], [address] Phone: [phone]   Fax:  [phone]  Physical Therapy Treatment  Patient Details  Name: Crystal [name] MRN: [id number] Date of Birth: 11-27-1950 No data recorded  Encounter Date: 10/26/2018  PT End of Session - 10/26/18 1042    Visit Number  3    Date for PT Re-Evaluation  12/10/18    Authorization Type  BCBS    PT Start Time  1000    PT Stop Time  1045    PT Time Calculation (min)  45 min    Activity Tolerance  Patient tolerated treatment well       Past Medical History:  Diagnosis Date  . BACK PAIN, LUMBAR   . Breast cancer, right breast   . FIBROADENOMA, BREAST 2006  . PARESTHESIA    toes  . PONV (postoperative nausea and vomiting)   . Radiation 11/23/14-12/25/14   right breast 42.72 gray, lumpectomy cavity boosted to 54.72 gray  . Wears glasses     Past Surgical History:  Procedure Laterality Date  . atrophic vaginitis    . BREAST LUMPECTOMY WITH RADIOACTIVE SEED LOCALIZATION Right 10/18/2014   Procedure: BREAST LUMPECTOMY WITH RADIOACTIVE SEED LOCALIZATION;  Surgeon: Coralie Keens, MD;  Location: [facility];  Service: General;  Laterality: Right;  . COLONOSCOPY  09/05/2013  . D & C and hysteroscopy  11/1998   for uterine polyp  . DILATATION & CURRETTAGE/HYSTEROSCOPY WITH RESECTOCOPE N/A 05/30/2015   Procedure: DILATATION & CURETTAGE/HYSTEROSCOPY WITH RESECTOCOPE;  Surgeon: Princess Bruins, MD;  Location: [facility];  Service: Gynecology;  Laterality: N/A;  . DILATION AND CURETTAGE OF UTERUS    . Sterotactix needle biopsy  2000   breast calcification    There were no vitals filed for this visit.  Subjective Assessment - 10/26/18 1000    Subjective  I thought is was better but I feel it on my left side more.  Sore after doing yardwork and holding grandbaby over the weekend.  I feel tingling in that spot but it's on the left  rather than right.  I've been doing ex's and raised computer monitor.      Pertinent History  osteopenia,  breast CA 2016, neuropathy    Currently in Pain?  Yes    Pain Score  2     Pain Location  Neck    Pain Orientation  Left    Pain Type  Chronic pain                    Review of initial HEP   [facility] Adult PT Treatment/Exercise - 10/26/18 0001      Neck Exercises: Prone   Axial Exension  10 reps    Shoulder Extension  10 reps    Other Prone Exercise  Ts with head lift 3# 10x    Other Prone Exercise  single arm elevation with head lift 10x right/left      Moist Heat Therapy   Number Minutes Moist Heat  5 Minutes    Moist Heat Location  Cervical      Manual Therapy   Soft tissue mobilization  bil cervical paraspinals, upper traps,left > right rhomboids       Trigger Point Dry Needling - 10/26/18 0001    Dry Needling Comments  bilaterally but left > right     Upper Trapezius Response  Twitch reponse elicited;Palpable increased muscle length    Levator Scapulae  Response  Palpable increased muscle length    Cervical multifidi Response  Palpable increased muscle length    Rhomboids Response  Palpable increased muscle length           PT Education - 10/26/18 1036    Education Details  prone head lift with Is, Ys, Ts and how to progress with resistance;  gave green band for supine ex's    Person(s) Educated  Patient    Methods  Explanation;Demonstration;Handout    Comprehension  Returned demonstration;Verbalized understanding       PT Short Term Goals - 10/15/18 1254      PT SHORT TERM GOAL #1   Title  The patient will demonstrate good understanding of postural correction and frequent change of position at work or when using the Automatic Data wheel     Time  4    Period  Weeks    Status  New    Target Date  11/12/18      PT SHORT TERM GOAL #2   Title  The patient will report a 30% improvement in lower cervical, upper back pain level with ADLs    Time  4     Period  Weeks    Status  New      PT SHORT TERM GOAL #3   Title  Patient will have improved thoracic extension to 10 degrees and symmetrical cervical sidebending and rotation     Time  4    Period  Weeks    Status  New        PT Long Term Goals - 10/15/18 1257      PT LONG TERM GOAL #1   Title  The patient will be independent with a safe self progression of HEP    Time  8    Period  Weeks    Status  New    Target Date  12/10/18      PT LONG TERM GOAL #2   Title  The patient will report a 70% improvement in pain across lower cervical/upper back region with home and work ADLs    Time  8    Period  Weeks    Status  New      PT LONG TERM GOAL #3   Title  The patient will have 4+/5 periscapular and trunk extensor strength needed for endurance for upright posture and to hold grandbaby    Time  8    Period  Weeks    Status  New      PT LONG TERM GOAL #4   Title  FOTO functional outcome score improved from 29% limitation to 22%     Time  8    Period  Weeks    Status  New            Plan - 10/26/18 1040    Clinical Impression Statement  The patient reports her right side is painfree but she has noted left sided discomfort after yard work and holding her 96 month old grandbaby.  She has tender points in left levator, upper trap and rhomboids.  Decreased size and number of tender points following DN and manual therapy.  She was instructed in a progression of postural strengthening in the prone position.  Therapist closely monitoring response with all interventions.  On track to meet rehab goals.      Rehab Potential  Good    PT Frequency  1x / week    PT Duration  8 weeks  PT Treatment/Interventions  ADLs/Self Care Home Management;Electrical Stimulation;Cryotherapy;Moist Heat;Traction;Ultrasound;Therapeutic exercise;Therapeutic activities;Neuromuscular re-education;Patient/family education;Manual techniques;Dry needling;Taping    PT Next Visit Plan  assess response to DN  to bil upper traps, levator, left rhomboid #2;   add standing green band ex's        Patient will benefit from skilled therapeutic intervention in order to improve the following deficits and impairments:  Pain, Postural dysfunction, Increased fascial restricitons, Decreased strength, Decreased range of motion  Visit Diagnosis: Cervicalgia  Abnormal posture     Problem List Patient Active Problem List   Diagnosis Date Noted  . Upper back pain 08/23/2018  . Analgesic rebound headache 08/23/2018  . Breast cancer of lower-outer quadrant of right female breast ([facility]) 10/24/2014  . Routine health maintenance 03/30/2013  . PARESTHESIA 06/25/2010   Ruben Im, PT 10/26/18 12:07 PM Phone: [phone] Fax: [phone]  Crystal [name] 10/26/2018, 12:06 PM  [facility] [address] [place], [place], [address] Phone: [phone]   Fax:  [phone]  Name: Crystal [name] MRN: [id number] Date of Birth: 1951/01/23

## 2018-11-02 ENCOUNTER — Ambulatory Visit: Payer: BC Managed Care – PPO | Attending: Internal Medicine | Admitting: Physical Therapy

## 2018-11-02 ENCOUNTER — Encounter: Payer: Self-pay | Admitting: Physical Therapy

## 2018-11-02 ENCOUNTER — Other Ambulatory Visit: Payer: Self-pay

## 2018-11-02 DIAGNOSIS — R293 Abnormal posture: Secondary | ICD-10-CM | POA: Insufficient documentation

## 2018-11-02 DIAGNOSIS — M542 Cervicalgia: Secondary | ICD-10-CM | POA: Insufficient documentation

## 2018-11-02 NOTE — Patient Instructions (Addendum)
Access Code: [email or web address]  URL: [email or web address]  Date: 11/02/2018  Prepared by: Ruben Im   Exercises  Seated Thoracic Lumbar Extension with Pectoralis Stretch - 10 reps - 1 sets - 1x daily - 7x weekly  Seated Scapular Retraction - 10 reps - 1 sets - 1x daily - 7x weekly  Neck Retraction - 10 reps - 1 sets - 1x daily - 7x weekly  Seated Cervical Sidebending Stretch - 3 reps - 1 sets - 30 hold - 1x daily - 7x weekly  Standing Shoulder Flexion with Dumbbells - 10 reps - 2 sets - 1x daily - 7x weekly  Scaption with Dumbbells - 10 reps - 2 sets - 1x daily - 7x weekly  Shoulder Abduction with Dumbbells - Thumbs Up - 10 reps - 2 sets - 1x daily - 7x weekly  Standing Bilateral Low Shoulder Row with Anchored Resistance - 10 reps - 2 sets - 1x daily - 7x weekly  Standing Shoulder Extension with Resistance - 10 reps - 2 sets - 1x daily - 7x weekly  Shoulder Flexion Wall Walk - 10 reps - 1 sets - 1x daily - 7x weekly  Shoulder Flexion Serratus Activation with Resistance - 10 reps - 1 sets - 1x daily - 7x weekly   Ruben Im PT

## 2018-11-02 NOTE — Therapy (Signed)
[facility] [address] [place], [place], [address] Phone: [phone]   Fax:  [phone]  Physical Therapy Treatment  Patient Details  Name: Crystal [name] MRN: [id number] Date of Birth: 24-Oct-1950 No data recorded  Encounter Date: 11/02/2018  PT End of Session - 11/02/18 1059    Visit Number  4    Date for PT Re-Evaluation  12/10/18    Authorization Type  BCBS    PT Start Time  1000    PT Stop Time  1050    PT Time Calculation (min)  50 min    Activity Tolerance  Patient tolerated treatment well       Past Medical History:  Diagnosis Date  . BACK PAIN, LUMBAR   . Breast cancer, right breast   . FIBROADENOMA, BREAST 2006  . PARESTHESIA    toes  . PONV (postoperative nausea and vomiting)   . Radiation 11/23/14-12/25/14   right breast 42.72 gray, lumpectomy cavity boosted to 54.72 gray  . Wears glasses     Past Surgical History:  Procedure Laterality Date  . atrophic vaginitis    . BREAST LUMPECTOMY WITH RADIOACTIVE SEED LOCALIZATION Right 10/18/2014   Procedure: BREAST LUMPECTOMY WITH RADIOACTIVE SEED LOCALIZATION;  Surgeon: Coralie Keens, MD;  Location: [place];  Service: General;  Laterality: Right;  . COLONOSCOPY  09/05/2013  . D & C and hysteroscopy  11/1998   for uterine polyp  . DILATATION & CURRETTAGE/HYSTEROSCOPY WITH RESECTOCOPE N/A 05/30/2015   Procedure: DILATATION & CURETTAGE/HYSTEROSCOPY WITH RESECTOCOPE;  Surgeon: Princess Bruins, MD;  Location: [facility];  Service: Gynecology;  Laterality: N/A;  . DILATION AND CURETTAGE OF UTERUS    . Sterotactix needle biopsy  2000   breast calcification    There were no vitals filed for this visit.  Subjective Assessment - 11/02/18 0958    Subjective  The left side is better.  The right side hurts just a touch with extending head.  It's better but still there just a tad.  I'm picking up soil and doing yardwork.  Doing standing ex's  against the wall which is much harder.      Pertinent History  osteopenia,  breast CA 2016, neuropathy    Currently in Pain?  Yes    Pain Score  1     Pain Location  Shoulder    Pain Orientation  Right    Pain Type  Chronic pain                       [facility] Adult PT Treatment/Exercise - 11/02/18 0001      Neck Exercises: Prone   Axial Exension  5 reps    Axial Extension Limitations  2#    Shoulder Extension  5 reps    Shoulder Extension Weights (lbs)  2#    Other Prone Exercise  Ts with head lift 2# 10x      Shoulder Exercises: Standing   Extension  Strengthening;Both;20 reps;Theraband    Theraband Level (Shoulder Extension)  Level 3 (Green)    Row  Strengthening;Both;20 reps;Theraband    Theraband Level (Shoulder Row)  Level 3 (Green)    Diagonals Limitations  red band wall walking 10x    Shoulder Elevation Limitations  red band steering wheels 10x    Other Standing Exercises  2# flexion 15x standing    Other Standing Exercises  2# scaption and abduction 15x      Moist Heat  Therapy   Number Minutes Moist Heat  5 Minutes    Moist Heat Location  Cervical      Manual Therapy   Manual therapy comments  Addaday instrument assisted myofascial work bil     Soft tissue mobilization  bil cervical paraspinals, upper traps, right rhomboids       Trigger Point Dry Needling - 11/02/18 0001    Consent Given?  Yes    Dry Needling Comments  bilaterally but right > left    Upper Trapezius Response  Twitch reponse elicited;Palpable increased muscle length    Levator Scapulae Response  Palpable increased muscle length    Cervical multifidi Response  Palpable increased muscle length           PT Education - 11/02/18 1045    Education Details   Access Code: [email or web address] 2# flexion, scaption, abduction;  green band standing rows, extensions; red band wall walk and steering wheels    Person(s) Educated  Patient    Methods  Explanation;Demonstration;Handout    Comprehension   Returned demonstration;Verbalized understanding       PT Short Term Goals - 10/15/18 1254      PT SHORT TERM GOAL #1   Title  The patient will demonstrate good understanding of postural correction and frequent change of position at work or when using the Automatic Data wheel     Time  4    Period  Weeks    Status  New    Target Date  11/12/18      PT SHORT TERM GOAL #2   Title  The patient will report a 30% improvement in lower cervical, upper back pain level with ADLs    Time  4    Period  Weeks    Status  New      PT SHORT TERM GOAL #3   Title  Patient will have improved thoracic extension to 10 degrees and symmetrical cervical sidebending and rotation     Time  4    Period  Weeks    Status  New        PT Long Term Goals - 10/15/18 1257      PT LONG TERM GOAL #1   Title  The patient will be independent with a safe self progression of HEP    Time  8    Period  Weeks    Status  New    Target Date  12/10/18      PT LONG TERM GOAL #2   Title  The patient will report a 70% improvement in pain across lower cervical/upper back region with home and work ADLs    Time  8    Period  Weeks    Status  New      PT LONG TERM GOAL #3   Title  The patient will have 4+/5 periscapular and trunk extensor strength needed for endurance for upright posture and to hold grandbaby    Time  8    Period  Weeks    Status  New      PT LONG TERM GOAL #4   Title  FOTO functional outcome score improved from 29% limitation to 22%     Time  8    Period  Weeks    Status  New            Plan - 11/02/18 1045    Clinical Impression Statement  The patient continues to report decreasing pain with functional activities.  Primariy pain  with lifting heavy bags of soil for yardwork and holding her 63 month old grandbaby.  Good carryover with previous HEP.  Decreasing size and number of tender points.  Therapist providing cues for middle and lower trap activation and to decrease compensatory shoulder  hike.      Personal Factors and Comorbidities  Profession;Comorbidity 1    Comorbidities  osteopenia, history of LBP    Examination-Activity Limitations  Reach Overhead    Examination-Participation Restrictions  Community Activity    Stability/Clinical Decision Making  Stable/Uncomplicated    Rehab Potential  Good    PT Frequency  1x / week    PT Duration  8 weeks    PT Treatment/Interventions  ADLs/Self Care Home Management;Electrical Stimulation;Cryotherapy;Moist Heat;Traction;Ultrasound;Therapeutic exercise;Therapeutic activities;Neuromuscular re-education;Patient/family education;Manual techniques;Dry needling;Taping    PT Next Visit Plan  assess response to DN to bil upper traps, levator,  rhomboid #3;  review and progress HEP ;  try dead lifting;  add biceps and triceps;  check STGs next visit     PT Home Exercise Plan   Access Code: [email or web address]        Patient will benefit from skilled therapeutic intervention in order to improve the following deficits and impairments:  Pain, Postural dysfunction, Increased fascial restricitons, Decreased strength, Decreased range of motion  Visit Diagnosis: Cervicalgia  Abnormal posture     Problem List Patient Active Problem List   Diagnosis Date Noted  . Upper back pain 08/23/2018  . Analgesic rebound headache 08/23/2018  . Breast cancer of lower-outer quadrant of right female breast ([place]) 10/24/2014  . Routine health maintenance 03/30/2013  . PARESTHESIA 06/25/2010   Ruben Im, PT 11/02/18 12:03 PM Phone: [phone] Fax: [phone] Alvera Singh 11/02/2018, 12:02 PM  [facility] [address] [place], [place], [address] Phone: [phone]   Fax:  [phone]  Name: CLARITY CISZEK MRN: [id number] Date of Birth: [birth year]

## 2018-11-09 ENCOUNTER — Encounter: Payer: Self-pay | Admitting: Physical Therapy

## 2018-11-09 ENCOUNTER — Ambulatory Visit: Payer: BC Managed Care – PPO | Admitting: Physical Therapy

## 2018-11-09 ENCOUNTER — Other Ambulatory Visit: Payer: Self-pay

## 2018-11-09 DIAGNOSIS — R293 Abnormal posture: Secondary | ICD-10-CM

## 2018-11-09 DIAGNOSIS — M542 Cervicalgia: Secondary | ICD-10-CM

## 2018-11-09 NOTE — Therapy (Signed)
[facility] [address], [place], [place], [address] Phone: [phone]   Fax:  [phone]  Physical Therapy Treatment  Patient Details  Name: Crystal [name] MRN: [id number] Date of Birth: May 10, 1951 No data recorded  Encounter Date: 11/09/2018  PT End of Session - 11/09/18 1049    Visit Number  5    Date for PT Re-Evaluation  12/10/18    Authorization Type  BCBS    PT Start Time  1000    PT Stop Time  1049    PT Time Calculation (min)  49 min    Activity Tolerance  Patient tolerated treatment well       Past Medical History:  Diagnosis Date  . BACK PAIN, LUMBAR   . Breast cancer, right breast   . FIBROADENOMA, BREAST 2006  . PARESTHESIA    toes  . PONV (postoperative nausea and vomiting)   . Radiation 11/23/14-12/25/14   right breast 42.72 gray, lumpectomy cavity boosted to 54.72 gray  . Wears glasses     Past Surgical History:  Procedure Laterality Date  . atrophic vaginitis    . BREAST LUMPECTOMY WITH RADIOACTIVE SEED LOCALIZATION Right 10/18/2014   Procedure: BREAST LUMPECTOMY WITH RADIOACTIVE SEED LOCALIZATION;  Surgeon: Coralie Keens, MD;  Location: [place];  Service: General;  Laterality: Right;  . COLONOSCOPY  09/05/2013  . D & C and hysteroscopy  11/1998   for uterine polyp  . DILATATION & CURRETTAGE/HYSTEROSCOPY WITH RESECTOCOPE N/A 05/30/2015   Procedure: DILATATION & CURETTAGE/HYSTEROSCOPY WITH RESECTOCOPE;  Surgeon: Princess Bruins, MD;  Location: [facility];  Service: Gynecology;  Laterality: N/A;  . DILATION AND CURETTAGE OF UTERUS    . Sterotactix needle biopsy  2000   breast calcification    There were no vitals filed for this visit.  Subjective Assessment - 11/09/18 1000    Subjective  Pretty good.  Its lingering a bit on the right side but it's still there.  Doesn't hurt at rest just with some activity.  Was able to hold the grandbaby this past weekend.      Pertinent History  osteopenia,  breast CA 2016, neuropathy    Currently in Pain?  Yes    Pain Score  1     Pain Location  Shoulder    Pain Orientation  Right    Pain Type  Chronic pain         [facility] PT Assessment - 11/09/18 0001      AROM   Cervical - Right Side Bend  40    Cervical - Left Side Bend  40    Thoracic Extension  10                   [facility] Adult PT Treatment/Exercise - 11/09/18 0001      Elbow Exercises   Elbow Flexion  Strengthening;Both    Bar Weights/Barbell (Elbow Flexion)  Other (comment)    Elbow Flexion Limitations  8#    Other elbow exercises  Overhead triceps 4# 15x      Shoulder Exercises: Standing   Extension  Strengthening;Both;20 reps;Theraband    Theraband Level (Shoulder Extension)  Level 3 (Green)    Diagonals Limitations  red band backward Cs 10x    Shoulder Elevation Limitations  red band steering wheels 10x    Other Standing Exercises  2# flexion 15x standing    Other Standing Exercises  2# scaption and abduction 15x  Shoulder Exercises: ROM/Strengthening   UBE (Upper Arm Bike)  forward and backward 5 min seated       Moist Heat Therapy   Number Minutes Moist Heat  5 Minutes    Moist Heat Location  Cervical      Manual Therapy   Manual therapy comments  cervical distraction 3x 20 sec     Soft tissue mobilization  bil cervical paraspinals, upper traps, right rhomboids    Muscle Energy Technique  contract relax bil upper traps       Trigger Point Dry Needling - 11/09/18 0001    Consent Given?  Yes    Dry Needling Comments  bilaterally but right > left    Upper Trapezius Response  Twitch reponse elicited;Palpable increased muscle length    Levator Scapulae Response  Palpable increased muscle length    Cervical multifidi Response  Palpable increased muscle length           PT Education - 11/09/18 1042    Education Details   Access Code: [email or web address] triceps and biceps    Person(s) Educated  Patient    Methods   Explanation;Demonstration;Handout    Comprehension  Returned demonstration;Verbalized understanding       PT Short Term Goals - 11/09/18 1052      PT SHORT TERM GOAL #1   Title  The patient will demonstrate good understanding of postural correction and frequent change of position at work or when using the Automatic Data wheel     Status  Achieved      PT [place] #2   Title  The patient will report a 30% improvement in lower cervical, upper back pain level with ADLs    Status  Achieved      PT SHORT TERM GOAL #3   Title  Patient will have improved thoracic extension to 10 degrees and symmetrical cervical sidebending and rotation     Status  Achieved        PT Long Term Goals - 10/15/18 1257      PT LONG TERM GOAL #1   Title  The patient will be independent with a safe self progression of HEP    Time  8    Period  Weeks    Status  New    Target Date  12/10/18      PT LONG TERM GOAL #2   Title  The patient will report a 70% improvement in pain across lower cervical/upper back region with home and work ADLs    Time  8    Period  Weeks    Status  New      PT LONG TERM GOAL #3   Title  The patient will have 4+/5 periscapular and trunk extensor strength needed for endurance for upright posture and to hold grandbaby    Time  8    Period  Weeks    Status  New      PT LONG TERM GOAL #4   Title  FOTO functional outcome score improved from 29% limitation to 22%     Time  8    Period  Weeks    Status  New            Plan - 11/09/18 1037    Clinical Impression Statement  The patient has decreased overall tender point size and number.  Improved cervical ROM especially with sidebending.  Demonstrates good compliance with HEP.  All STGs met.  Therapist providing cues to avoid compensatory shoulder  hike and to monitor response with all interventions.      Rehab Potential  Good    PT Frequency  1x / week    PT Duration  8 weeks    PT Treatment/Interventions  ADLs/Self Care  Home Management;Electrical Stimulation;Cryotherapy;Moist Heat;Traction;Ultrasound;Therapeutic exercise;Therapeutic activities;Neuromuscular re-education;Patient/family education;Manual techniques;Dry needling;Taping    PT Next Visit Plan  assess response to DN to bil upper traps, levator;  review and progress HEP ;  try dead lifting;  will begin to decrease treatment frequency    PT Home Exercise Plan   Access Code: [email or web address]        Patient will benefit from skilled therapeutic intervention in order to improve the following deficits and impairments:  Pain, Postural dysfunction, Increased fascial restricitons, Decreased strength, Decreased range of motion  Visit Diagnosis: Cervicalgia  Abnormal posture     Problem List Patient Active Problem List   Diagnosis Date Noted  . Upper back pain 08/23/2018  . Analgesic rebound headache 08/23/2018  . Breast cancer of lower-outer quadrant of right female breast ([place]) 10/24/2014  . Routine health maintenance 03/30/2013  . PARESTHESIA 06/25/2010   Ruben Im, PT 11/09/18 12:35 PM Phone: [phone] Fax: [phone]  Alvera Singh 11/09/2018, 12:35 PM  [facility] [address], [place], [place], [address] Phone: [phone]   Fax:  [phone]  Name: Crystal [name] MRN: [id number] Date of Birth: 06-08-1951

## 2018-11-09 NOTE — Patient Instructions (Signed)
Access Code: [email or web address]  URL: [email or web address]  Date: 11/09/2018  Prepared by: Ruben Im   Exercises  Seated Thoracic Lumbar Extension with Pectoralis Stretch - 10 reps - 1 sets - 1x daily - 7x weekly  Seated Scapular Retraction - 10 reps - 1 sets - 1x daily - 7x weekly  Neck Retraction - 10 reps - 1 sets - 1x daily - 7x weekly  Seated Cervical Sidebending Stretch - 3 reps - 1 sets - 30 hold - 1x daily - 7x weekly  Standing Shoulder Flexion with Dumbbells - 10 reps - 2 sets - 1x daily - 7x weekly  Scaption with Dumbbells - 10 reps - 2 sets - 1x daily - 7x weekly  Shoulder Abduction with Dumbbells - Thumbs Up - 10 reps - 2 sets - 1x daily - 7x weekly  Standing Bilateral Low Shoulder Row with Anchored Resistance - 10 reps - 2 sets - 1x daily - 7x weekly  Standing Shoulder Extension with Resistance - 10 reps - 2 sets - 1x daily - 7x weekly  Shoulder Flexion Wall Walk - 10 reps - 1 sets - 1x daily - 7x weekly  Shoulder Flexion Serratus Activation with Resistance - 10 reps - 1 sets - 1x daily - 7x weekly  Standing Overhead Elbow Extension - 10 reps - 2 sets - 1x daily - 7x weekly  Standing Pronated Elbow Flexion with Dumbbell - 10 reps - 3 sets - 1x daily - 7x weekly    Ruben Im PT

## 2018-11-16 ENCOUNTER — Other Ambulatory Visit: Payer: Self-pay

## 2018-11-16 ENCOUNTER — Ambulatory Visit: Payer: BC Managed Care – PPO | Admitting: Physical Therapy

## 2018-11-16 DIAGNOSIS — R293 Abnormal posture: Secondary | ICD-10-CM

## 2018-11-16 DIAGNOSIS — M542 Cervicalgia: Secondary | ICD-10-CM | POA: Diagnosis not present

## 2018-11-16 NOTE — Therapy (Signed)
[facility] [address] [place], [place], [address] Phone: [phone]   Fax:  [phone]  Physical Therapy Treatment  Patient Details  Name: Crystal [name] MRN: [id number] Date of Birth: 06-18-1951 No data recorded  Encounter Date: 11/16/2018  PT End of Session - 11/16/18 1219    Visit Number  6    Date for PT Re-Evaluation  12/10/18    Authorization Type  BCBS    PT Start Time  1000    PT Stop Time  1048    PT Time Calculation (min)  48 min    Activity Tolerance  Patient tolerated treatment well       Past Medical History:  Diagnosis Date  . BACK PAIN, LUMBAR   . Breast cancer, right breast   . FIBROADENOMA, BREAST 2006  . PARESTHESIA    toes  . PONV (postoperative nausea and vomiting)   . Radiation 11/23/14-12/25/14   right breast 42.72 gray, lumpectomy cavity boosted to 54.72 gray  . Wears glasses     Past Surgical History:  Procedure Laterality Date  . atrophic vaginitis    . BREAST LUMPECTOMY WITH RADIOACTIVE SEED LOCALIZATION Right 10/18/2014   Procedure: BREAST LUMPECTOMY WITH RADIOACTIVE SEED LOCALIZATION;  Surgeon: Coralie Keens, MD;  Location: [place];  Service: General;  Laterality: Right;  . COLONOSCOPY  09/05/2013  . D & C and hysteroscopy  11/1998   for uterine polyp  . DILATATION & CURRETTAGE/HYSTEROSCOPY WITH RESECTOCOPE N/A 05/30/2015   Procedure: DILATATION & CURETTAGE/HYSTEROSCOPY WITH RESECTOCOPE;  Surgeon: Princess Bruins, MD;  Location: [facility];  Service: Gynecology;  Laterality: N/A;  . DILATION AND CURETTAGE OF UTERUS    . Sterotactix needle biopsy  2000   breast calcification    There were no vitals filed for this visit.  Subjective Assessment - 11/16/18 0959    Subjective  Definitely better.  Almost perfect.  I'm hoping for perfection.  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  Going to [facility] soon.  Exercises going OK.  Using 2# flour  bags.      Pertinent History  osteopenia,  breast CA 2016, neuropathy    Currently in Pain?  No/denies    Pain Score  0-No pain         [facility] PT Assessment - 11/16/18 0001      AROM   Cervical Flexion  50    Cervical Extension  46    Cervical - Right Side Bend  40    Cervical - Left Side Bend  40                   [facility] Adult PT Treatment/Exercise - 11/16/18 0001      Neck Exercises: Prone   Plank  quadruped shoulder taps    Other Prone Exercise  bent Is,Ys,Ts 2# 15x    Other Prone Exercise  knees down push ups 10x      Shoulder Exercises: Standing   Shoulder Elevation Limitations  wall push ups 15x    Other Standing Exercises  counter top push ups 10x    Other Standing Exercises  beach ball A to Z       Shoulder Exercises: ROM/Strengthening   UBE (Upper Arm Bike)  forward and backward 6 min seated     Other ROM/Strengthening Exercises  6# bil triceps overhead 10x    Other ROM/Strengthening Exercises  6# single arm biceps 2x 10  Moist Heat Therapy   Number Minutes Moist Heat  5 Minutes    Moist Heat Location  Cervical      Manual Therapy   Soft tissue mobilization  bil cervical paraspinals, upper traps, right rhomboids       Trigger Point Dry Needling - 11/16/18 0001    Consent Given?  Yes    Dry Needling Comments  bilaterally but right > left    Upper Trapezius Response  Twitch reponse elicited;Palpable increased muscle length    Levator Scapulae Response  Palpable increased muscle length    Cervical multifidi Response  Palpable increased muscle length           PT Education - 11/16/18 1044    Education Details   Access Code: [email or web address]  quadruped shoulder taps, bent forward Is, Ys,Ts;  wall and counter push ups; ball on wall     Person(s) Educated  Patient    Methods  Explanation;Handout    Comprehension  Returned demonstration;Verbalized understanding       PT Short Term Goals - 11/09/18 1052      PT SHORT TERM GOAL #1   Title  The  patient will demonstrate good understanding of postural correction and frequent change of position at work or when using the Automatic Data wheel     Status  Achieved      PT [place] #2   Title  The patient will report a 30% improvement in lower cervical, upper back pain level with ADLs    Status  Achieved      PT SHORT TERM GOAL #3   Title  Patient will have improved thoracic extension to 10 degrees and symmetrical cervical sidebending and rotation     Status  Achieved        PT Long Term Goals - 10/15/18 1257      PT LONG TERM GOAL #1   Title  The patient will be independent with a safe self progression of HEP    Time  8    Period  Weeks    Status  New    Target Date  12/10/18      PT LONG TERM GOAL #2   Title  The patient will report a 70% improvement in pain across lower cervical/upper back region with home and work ADLs    Time  8    Period  Weeks    Status  New      PT LONG TERM GOAL #3   Title  The patient will have 4+/5 periscapular and trunk extensor strength needed for endurance for upright posture and to hold grandbaby    Time  8    Period  Weeks    Status  New      PT LONG TERM GOAL #4   Title  FOTO functional outcome score improved from 29% limitation to 22%     Time  8    Period  Weeks    Status  New            Plan - 11/16/18 1037    Clinical Impression Statement  The patient demonstrates good compliance with current HEP.  Pain level significantly less and present only with prolonged and more strenuous activities.  Full of cervical and shoulder regions.  Much improve soft tissue extensibility and decreasing tender points.   Will follow up in 2 weeks.  Should meet remaining LTGs in 1-2 visits and be ready for discharge.      Personal Factors  and Comorbidities  Profession;Comorbidity 1    Comorbidities  osteopenia, history of LBP    Examination-Activity Limitations  Reach Overhead    Examination-Participation Restrictions  Community Activity     Rehab Potential  Good    PT Frequency  1x / week    PT Duration  8 weeks    PT Treatment/Interventions  ADLs/Self Care Home Management;Electrical Stimulation;Cryotherapy;Moist Heat;Traction;Ultrasound;Therapeutic exercise;Therapeutic activities;Neuromuscular re-education;Patient/family education;Manual techniques;Dry needling;Taping    PT Next Visit Plan  follow up in 2 weeks;  recheck FOTO;  assess response to DN to bil upper traps, levator;  review and progress HEP strengthening; discharge in 1-2 visits    PT Home Exercise Plan   Access Code: [email or web address]        Patient will benefit from skilled therapeutic intervention in order to improve the following deficits and impairments:  Pain, Postural dysfunction, Increased fascial restricitons, Decreased strength, Decreased range of motion  Visit Diagnosis: Cervicalgia  Abnormal posture     Problem List Patient Active Problem List   Diagnosis Date Noted  . Upper back pain 08/23/2018  . Analgesic rebound headache 08/23/2018  . Breast cancer of lower-outer quadrant of right female breast ([place]) 10/24/2014  . Routine health maintenance 03/30/2013  . PARESTHESIA 06/25/2010   Ruben Im, PT 11/16/18 12:26 PM Phone: [phone] Fax: [phone]  Alvera Singh 11/16/2018, 12:25 PM  [facility] [address] [place], [place], [address] Phone: [phone]   Fax:  [phone]  Name: Crystal [name] MRN: [id number] Date of Birth: 1951-04-30

## 2018-11-16 NOTE — Patient Instructions (Addendum)
Access Code: [email or web address]  URL: [email or web address]  Date: 11/16/2018  Prepared by: Ruben Im   Exercises  Seated Thoracic Lumbar Extension with Pectoralis Stretch - 10 reps - 1 sets - 1x daily - 7x weekly  Seated Scapular Retraction - 10 reps - 1 sets - 1x daily - 7x weekly  Neck Retraction - 10 reps - 1 sets - 1x daily - 7x weekly  Seated Cervical Sidebending Stretch - 3 reps - 1 sets - 30 hold - 1x daily - 7x weekly  Standing Shoulder Flexion with Dumbbells - 10 reps - 2 sets - 1x daily - 7x weekly  Scaption with Dumbbells - 10 reps - 2 sets - 1x daily - 7x weekly  Shoulder Abduction with Dumbbells - Thumbs Up - 10 reps - 2 sets - 1x daily - 7x weekly  Standing Bilateral Low Shoulder Row with Anchored Resistance - 10 reps - 2 sets - 1x daily - 7x weekly  Standing Shoulder Extension with Resistance - 10 reps - 2 sets - 1x daily - 7x weekly  Shoulder Flexion Wall Walk - 10 reps - 1 sets - 1x daily - 7x weekly  Shoulder Flexion Serratus Activation with Resistance - 10 reps - 1 sets - 1x daily - 7x weekly  Standing Overhead Elbow Extension - 10 reps - 2 sets - 1x daily - 7x weekly  Standing Pronated Elbow Flexion with Dumbbell - 10 reps - 3 sets - 1x daily - 7x weekly  Quadruped Shoulder Taps - 10 reps - 2 sets - 1x daily - 7x weekly  Prone Single Arm Shoulder Extension on Swiss Ball with Dumbbell - 10 reps - 2 sets - 1x daily - 7x weekly  Quadruped Neck Rotation with Same Side Shoulder Horizontal Abduction - 10 reps - 2 sets - 1x daily - 7x weekly  Single Arm Bent Over Shoulder Scaption with Dumbbell - 10 reps - 2 sets - 1x daily - 7x weekly  Wall Push Up - 10 reps - 2 sets - 1x daily - 7x weekly  Push-Up on Counter - 10 reps - 2 sets - 1x daily - 7x weekly  Standing Wall Ball Circles in Scaption with Mini Swiss Ball - 10 reps - 2 sets - 1x daily - 7x weekly     Ruben Im PT

## 2018-11-30 ENCOUNTER — Encounter: Payer: Self-pay | Admitting: Physical Therapy

## 2018-11-30 ENCOUNTER — Ambulatory Visit: Payer: BC Managed Care – PPO | Attending: Internal Medicine | Admitting: Physical Therapy

## 2018-11-30 ENCOUNTER — Other Ambulatory Visit: Payer: Self-pay

## 2018-11-30 DIAGNOSIS — M542 Cervicalgia: Secondary | ICD-10-CM | POA: Diagnosis not present

## 2018-11-30 DIAGNOSIS — R293 Abnormal posture: Secondary | ICD-10-CM | POA: Diagnosis present

## 2018-11-30 NOTE — Therapy (Signed)
[facility] [address], [place], [place], [address] Phone: [phone]   Fax:  [phone]  Physical Therapy Treatment/Discharge Summary   Patient Details  Name: Crystal [name] MRN: [id number] Date of Birth: 02-26-1951 No data recorded  Encounter Date: 11/30/2018  PT End of Session - 11/30/18 1120    Visit Number  7    Date for PT Re-Evaluation  12/10/18    Authorization Type  BCBS    PT Start Time  1034    PT Stop Time  1113    PT Time Calculation (min)  39 min    Activity Tolerance  Patient tolerated treatment well       Past Medical History:  Diagnosis Date  . BACK PAIN, LUMBAR   . Breast cancer, right breast   . FIBROADENOMA, BREAST 2006  . PARESTHESIA    toes  . PONV (postoperative nausea and vomiting)   . Radiation 11/23/14-12/25/14   right breast 42.72 gray, lumpectomy cavity boosted to 54.72 gray  . Wears glasses     Past Surgical History:  Procedure Laterality Date  . atrophic vaginitis    . BREAST LUMPECTOMY WITH RADIOACTIVE SEED LOCALIZATION Right 10/18/2014   Procedure: BREAST LUMPECTOMY WITH RADIOACTIVE SEED LOCALIZATION;  Surgeon: Coralie Keens, MD;  Location: [facility];  Service: General;  Laterality: Right;  . COLONOSCOPY  09/05/2013  . D & C and hysteroscopy  11/1998   for uterine polyp  . DILATATION & CURRETTAGE/HYSTEROSCOPY WITH RESECTOCOPE N/A 05/30/2015   Procedure: DILATATION & CURETTAGE/HYSTEROSCOPY WITH RESECTOCOPE;  Surgeon: Princess Bruins, MD;  Location: [facility];  Service: Gynecology;  Laterality: N/A;  . DILATION AND CURETTAGE OF UTERUS    . Sterotactix needle biopsy  2000   breast calcification    There were no vitals filed for this visit.  Subjective Assessment - 11/30/18 1036    Subjective  I've felt pretty good but I can feel it some.  Not quite 100%.      Pertinent History  osteopenia,  breast CA 2016, neuropathy    Currently in Pain?  No/denies    Pain Score  0-No pain    Pain Orientation  Right         [facility] PT Assessment - 11/30/18 0001      Observation/Other Assessments   Focus on Therapeutic Outcomes (FOTO)   2% limited       AROM   Cervical Flexion  60    Cervical Extension  55    Cervical - Right Side Bend  45    Cervical - Left Side Bend  45    Cervical - Right Rotation  50    Cervical - Left Rotation  50      Strength   Right/Left Shoulder  --   periscapular muscles 5/5   Cervical Flexion  5/5    Cervical Extension  5/5    Thoracic Extension  5/5                   [facility] Adult PT Treatment/Exercise - 11/30/18 0001      Shoulder Exercises: Standing   Other Standing Exercises  comprehensive review of HEP and discussion of safe self progression of HEP,  discussed frequency of ex      Shoulder Exercises: ROM/Strengthening   UBE (Upper Arm Bike)  forward5 min seated    while discussing status and HEP            PT Education -  11/30/18 1117    Education Details   Access Code: [email or web address] review of HEP and modification of thoracic extension seated ex    Person(s) Educated  Patient    Methods  Explanation;Handout;Demonstration    Comprehension  Returned demonstration;Verbalized understanding       PT Short Term Goals - 11/30/18 1123      PT SHORT TERM GOAL #1   Title  The patient will demonstrate good understanding of postural correction and frequent change of position at work or when using the [employer] wheel     Status  Achieved      PT [place] #2   Title  The patient will report a 30% improvement in lower cervical, upper back pain level with ADLs    Status  Achieved      PT SHORT TERM GOAL #3   Title  Patient will have improved thoracic extension to 10 degrees and symmetrical cervical sidebending and rotation     Status  Achieved        PT Long Term Goals - 11/30/18 1059      PT LONG TERM GOAL #1   Title  The patient will be independent with a safe self progression of HEP     Status  Achieved      PT LONG TERM GOAL #2   Title  The patient will report a 70% improvement in pain across lower cervical/upper back region with home and work ADLs    Status  Achieved      PT LONG TERM GOAL #3   Title  The patient will have 4+/5 periscapular and trunk extensor strength needed for endurance for upright posture and to hold grandbaby    Status  Achieved      PT LONG TERM GOAL #4   Title  FOTO functional outcome score improved from 29% limitation to 22%     Status  Achieved            Plan - 11/30/18 1051    Clinical Impression Statement  The patient reports an overall improvement of 95%.  Her cervical and shoulder ROM is WFLs and symmetrical.  Thoracic extension limited and we discussed a modification of her HEP to address this problem.  Her cervical and periscapular strength is grossly 5/5.  Her FOTO functional outcome score has significantly improved to only a 2% limitation.  She attributes her good progress to dry needling in conjunction with compliance with her HEP.  Will discharge from PT at this time with all goals met.      PT Home Exercise Plan   Access Code: [email or web address]        Patient will benefit from skilled therapeutic intervention in order to improve the following deficits and impairments:     Visit Diagnosis: Cervicalgia  Abnormal posture    PHYSICAL THERAPY DISCHARGE SUMMARY  Visits from Start of Care: 7  Current functional level related to goals / functional outcomes: All goals met.  See clinical impressions above   Remaining deficits: As above   Education / Equipment: Comprehensive HEP Plan: Patient agrees to discharge.  Patient goals were met. Patient is being discharged due to meeting the stated rehab goals.  ?????      Problem List Patient Active Problem List   Diagnosis Date Noted  . Upper back pain 08/23/2018  . Analgesic rebound headache 08/23/2018  . Breast cancer of lower-outer quadrant of right female breast ([place])  10/24/2014  . Routine health maintenance 03/30/2013  .  PARESTHESIA 06/25/2010   Ruben Im, PT 11/30/18 11:26 AM Phone: [phone] Fax: [phone]  Alvera Singh 11/30/2018, 11:24 AM  [facility] [address] [place], [place], [address] Phone: [phone]   Fax:  [phone]  Name: Crystal [name] MRN: [id number] Date of Birth: 1950-09-08

## 2018-11-30 NOTE — Patient Instructions (Signed)
Access Code: [email or web address]  URL: [email or web address]  Date: 11/30/2018  Prepared by: Ruben Im   Exercises  Seated Thoracic Lumbar Extension with Pectoralis Stretch - 10 reps - 1 sets - 1x daily - 7x weekly  Seated Scapular Retraction - 10 reps - 1 sets - 1x daily - 7x weekly  Neck Retraction - 10 reps - 1 sets - 1x daily - 7x weekly  Seated Cervical Sidebending Stretch - 3 reps - 1 sets - 30 hold - 1x daily - 7x weekly  Standing Shoulder Flexion with Dumbbells - 10 reps - 2 sets - 1x daily - 7x weekly  Scaption with Dumbbells - 10 reps - 2 sets - 1x daily - 7x weekly  Shoulder Abduction with Dumbbells - Thumbs Up - 10 reps - 2 sets - 1x daily - 7x weekly  Standing Bilateral Low Shoulder Row with Anchored Resistance - 10 reps - 2 sets - 1x daily - 7x weekly  Standing Shoulder Extension with Resistance - 10 reps - 2 sets - 1x daily - 7x weekly  Shoulder Flexion Wall Walk - 10 reps - 1 sets - 1x daily - 7x weekly  Shoulder Flexion Serratus Activation with Resistance - 10 reps - 1 sets - 1x daily - 7x weekly  Standing Overhead Elbow Extension - 10 reps - 2 sets - 1x daily - 7x weekly  Standing Pronated Elbow Flexion with Dumbbell - 10 reps - 3 sets - 1x daily - 7x weekly  Quadruped Shoulder Taps - 10 reps - 2 sets - 1x daily - 7x weekly  Prone Single Arm Shoulder Extension on Swiss Ball with Dumbbell - 10 reps - 2 sets - 1x daily - 7x weekly  Quadruped Neck Rotation with Same Side Shoulder Horizontal Abduction - 10 reps - 2 sets - 1x daily - 7x weekly  Single Arm Bent Over Shoulder Scaption with Dumbbell - 10 reps - 2 sets - 1x daily - 7x weekly  Wall Push Up - 10 reps - 2 sets - 1x daily - 7x weekly  Push-Up on Counter - 10 reps - 2 sets - 1x daily - 7x weekly  Standing Wall Ball Circles in Scaption with Mini Swiss Ball - 10 reps - 2 sets - 1x daily - 7x weekly

## 2019-01-28 NOTE — Assessment & Plan Note (Signed)
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 Tolerating anastrozole extremely well.  Severe osteopenia: T score -2.1 (improved from -2.4) based on bone mineral density done 06/01/2017 Currently on Fosamax. In combination with calcium and vitamin D.  Breast cancer surveillance:  1. Mammograms 11/30/2018 at [facility]: normal, breast density category D. 2. Breast Exam:02/03/18: benign  Return to clinic in 1 yearfor follow-up

## 2019-02-01 ENCOUNTER — Telehealth: Payer: Self-pay | Admitting: Hematology and Oncology

## 2019-02-01 NOTE — Telephone Encounter (Signed)
I talk with patient regarding video visit °

## 2019-02-03 ENCOUNTER — Telehealth: Payer: Self-pay | Admitting: Hematology and Oncology

## 2019-02-03 NOTE — Progress Notes (Signed)
HEMATOLOGY-ONCOLOGY [facility] VIDEO VISIT PROGRESS NOTE  I connected with Crystal [name] on 02/04/2019 at  8:30 AM EDT by MyChart video conference and verified that I am speaking with the correct person using two identifiers.  I discussed the limitations, risks, security and privacy concerns of performing an evaluation and management service by MyChart and the availability of in person appointments.  I also discussed with the patient that there may be a patient responsible charge related to this service. The patient expressed understanding and agreed to proceed.  Patient's Location: Home Physician Location: Clinic  CHIEF COMPLIANT: Follow-up of right breast cancer on anastrozole therapy  INTERVAL HISTORY: Crystal [name] is a 68 y.o. female with above-mentioned history of right breast cancer treated with lumpectomy, radiation, and who is currently on anti-estrogen therapy with anastrozole. I last saw her a year ago. Mammogram on 11/30/18 showed no evidence of malignancy bilaterally. She presents over MyChart today for annual follow-up.   Oncology History  Breast cancer of lower-outer quadrant of right female breast ([place])  09/04/2014 Mammogram   Right breast with 1 cm new cluster of calcifications at 7:00, 5 cm from nipple; 0.5 cm oval aysmmetry in left medial breast, 3.5 from nipple.     09/08/2014 Breast US   Right breast: possible 5 mm oval cyst, hypoechoic, correlates with mammographic findings. no axillary findings.   09/20/2014 Initial Biopsy   Right breast needle core bx: DCIS with associated calcifications, ER+ (97%), PR+ (67%)   09/20/2014 Clinical Stage   Stage 0: Tis N0   10/18/2014 Definitive Surgery   Right breast lumpectomy [facility]): DCIS, grade 1, calcifications present. ER+ (97%), PR+ (67%). 2.6 cm in size. 0.5mm from nearest margin (posterior). [facility] nomogram risk of recurrence 2% at 10 years with antiestrogen therapy and radiation   10/18/2014 Pathologic Stage   pTis,  pNx: Stage 0   11/23/2014 - 12/25/2014 Radiation Therapy   Adjuvant RT completed ([place]): right breast 42.72 Gy over 16 fractions; boost to lumpectomy cavity 12 Gy; total dose received: 54.72 Gy.     12/30/2014 -  Anti-estrogen oral therapy   Tamoxifen 20 mg daily stopped 02/27/2015 due to uterine hypertrophy and bleeding, D&C 05/30/2015, anastrozole began 06/14/2015   03/09/2015 Survivorship   A copy of the survivorship care plan was mailed to the patient in lieu of an in-person visit at her request.     REVIEW OF SYSTEMS:   Constitutional: Denies fevers, chills or abnormal weight loss Eyes: Denies blurriness of vision Ears, nose, mouth, throat, and face: Denies mucositis or sore throat Respiratory: Denies cough, dyspnea or wheezes Cardiovascular: Denies palpitation, chest discomfort Gastrointestinal:  Denies nausea, heartburn or change in bowel habits Skin: Denies abnormal skin rashes Lymphatics: Denies new lymphadenopathy or easy bruising Neurological:Denies numbness, tingling or new weaknesses Behavioral/Psych: Mood is stable, no new changes  Extremities: No lower extremity edema Breast: denies any pain or lumps or nodules in either breasts All other systems were reviewed with the patient and are negative.  Observations/Objective:  There were no vitals filed for this visit. There is no height or weight on file to calculate BMI.  I have reviewed the data as listed CMP Latest Ref Rng & Units 04/07/2018 09/14/2017 04/03/2017  Glucose 70 - 99 mg/dL 104(H) 122(H) -  BUN 6 - 23 mg/dL 17 15 -  Creatinine 0.40 - 1.20 mg/dL 0.85 1.05(H) -  Sodium 135 - 145 mEq/L 139 137 -  Potassium 3.5 - 5.1 mEq/L 4.4 4.2 -  Chloride 96 -  112 mEq/L 102 102 -  CO2 19 - 32 mEq/L 32 25 -  Calcium 8.4 - 10.5 mg/dL 10.1 9.2 -  Total Protein 6.0 - 8.3 g/dL 7.6 7.5 6.7  Total Bilirubin 0.2 - 1.2 mg/dL 0.4 0.6 -  Alkaline Phos 39 - 117 U/L 50 56 -  AST 0 - 37 U/L 19 43(H) -  ALT 0 - 35 U/L 16 30 -    Lab  Results  Component Value Date   WBC 6.6 04/07/2018   HGB 12.7 04/07/2018   HCT 37.8 04/07/2018   MCV 91.9 04/07/2018   PLT 285.0 04/07/2018   NEUTROABS 3.3 02/02/2017      Assessment Plan:  Breast cancer of lower-outer quadrant of right female breast ([place]) Rt Breast DCIS grade 1, 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 Tolerating anastrozole extremely well.  Severe osteopenia: T score -2.1 (improved from -2.4) based on bone mineral density done 06/01/2017 Currently on Fosamax. In combination with calcium and vitamin D. I renewed her prescription for Fosamax as well today. I ordered a bone density to be done December 2020.  Breast cancer surveillance:  1. Mammograms 11/30/2018 at [facility]: normal, breast density category D. 2. Breast Exam:02/03/18: benign  Return to clinic in 1 yearfor follow-up    I discussed the assessment and treatment plan with the patient. The patient was provided an opportunity to ask questions and all were answered. The patient agreed with the plan and demonstrated an understanding of the instructions. The patient was advised to call back or seek an in-person evaluation if the symptoms worsen or if the condition fails to improve as anticipated.   I provided 15 minutes of face-to-face MyChart video visit time during this encounter.    Rulon Eisenmenger, MD 02/04/2019   I, Molly Dorshimer, am acting as scribe for Nicholas Lose, MD.  I have reviewed the above documentation for accuracy and completeness, and I agree with the above.

## 2019-02-04 ENCOUNTER — Inpatient Hospital Stay: Payer: Medicare Other | Attending: Hematology and Oncology | Admitting: Hematology and Oncology

## 2019-02-04 DIAGNOSIS — Z79811 Long term (current) use of aromatase inhibitors: Secondary | ICD-10-CM | POA: Diagnosis not present

## 2019-02-04 DIAGNOSIS — Z780 Asymptomatic menopausal state: Secondary | ICD-10-CM | POA: Diagnosis not present

## 2019-02-04 DIAGNOSIS — M8580 Other specified disorders of bone density and structure, unspecified site: Secondary | ICD-10-CM | POA: Insufficient documentation

## 2019-02-04 DIAGNOSIS — Z170 Estrogen receptor positive status [ER+]: Secondary | ICD-10-CM | POA: Insufficient documentation

## 2019-02-04 DIAGNOSIS — Z923 Personal history of irradiation: Secondary | ICD-10-CM | POA: Diagnosis not present

## 2019-02-04 DIAGNOSIS — Z79899 Other long term (current) drug therapy: Secondary | ICD-10-CM | POA: Insufficient documentation

## 2019-02-04 DIAGNOSIS — C50511 Malignant neoplasm of lower-outer quadrant of right female breast: Secondary | ICD-10-CM | POA: Diagnosis not present

## 2019-02-04 MED ORDER — ANASTROZOLE 1 MG PO TABS: 1.0000 mg | ORAL_TABLET | Freq: Every day | ORAL | 3 refills | Status: DC

## 2019-02-04 MED ORDER — ALENDRONATE SODIUM 70 MG PO TABS: ORAL_TABLET | ORAL | 3 refills | Status: DC

## 2019-02-07 ENCOUNTER — Telehealth: Payer: Self-pay | Admitting: Hematology and Oncology

## 2019-02-07 NOTE — Telephone Encounter (Signed)
I talk with patient regarding schedule  

## 2019-04-27 IMAGING — DX THORACIC SPINE 2 VIEWS
3 series · 3 of 3 positions shown · non-contrast
Comparison: None.

CLINICAL DATA: Right upper back and scapular pain for 2 months. No
known injury.

EXAM:
THORACIC SPINE 2 VIEWS

[t-spine ap]
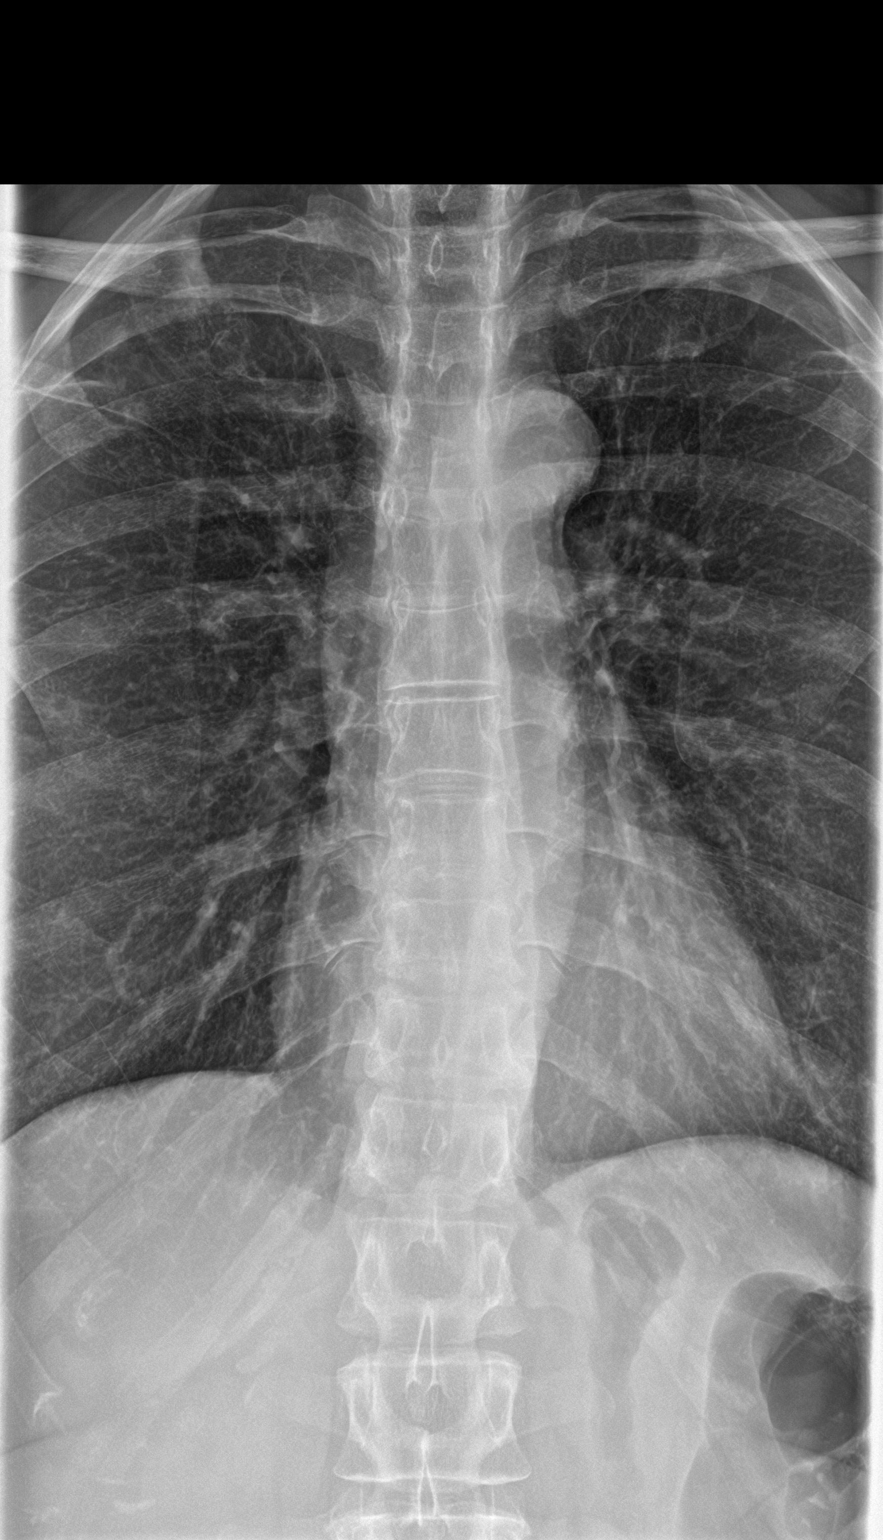

[t-spine lat]
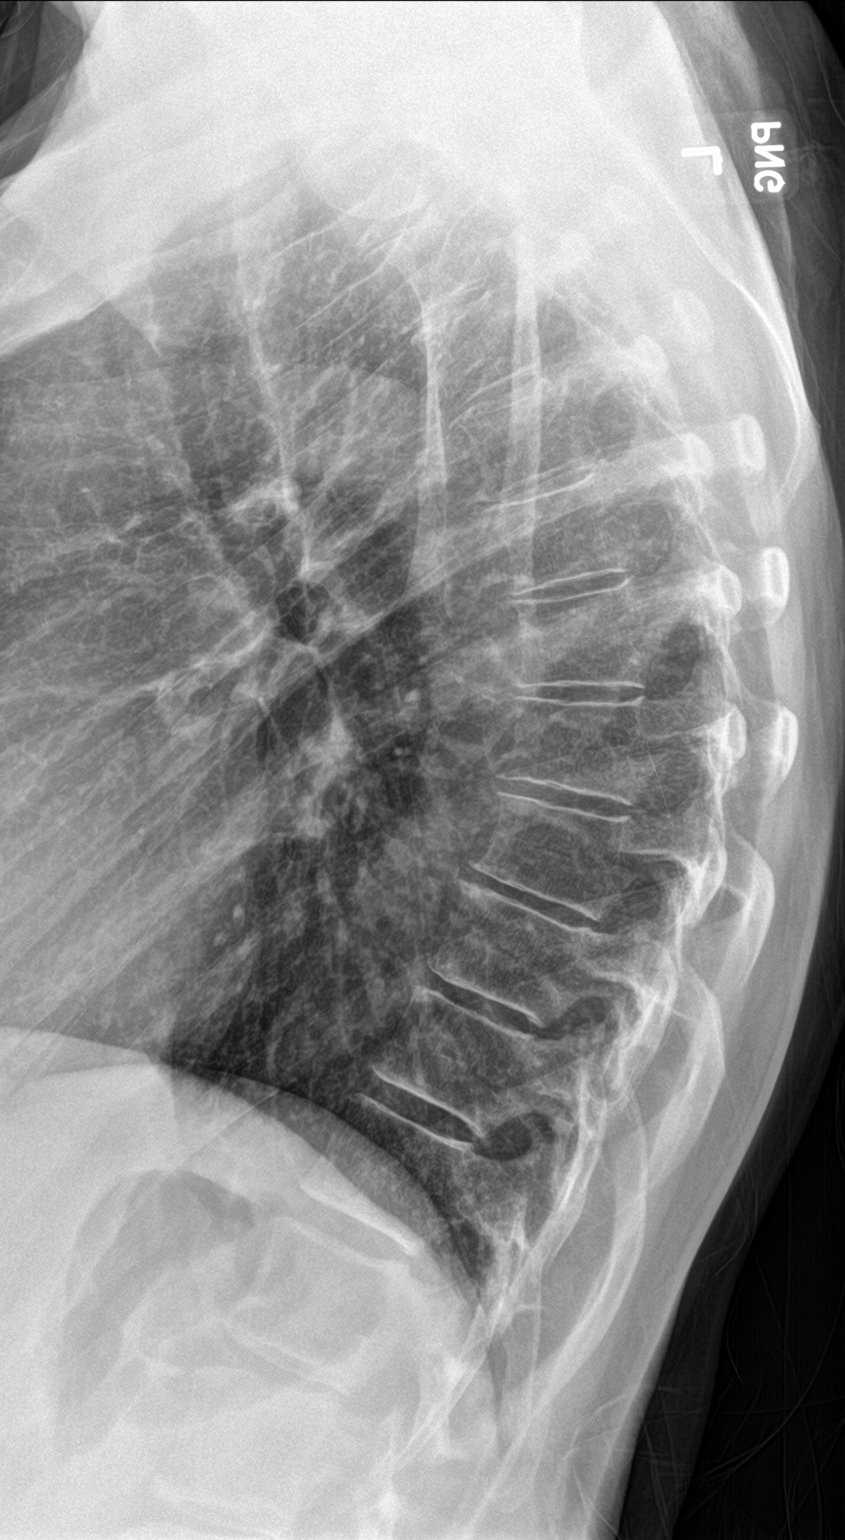

[swimmer]
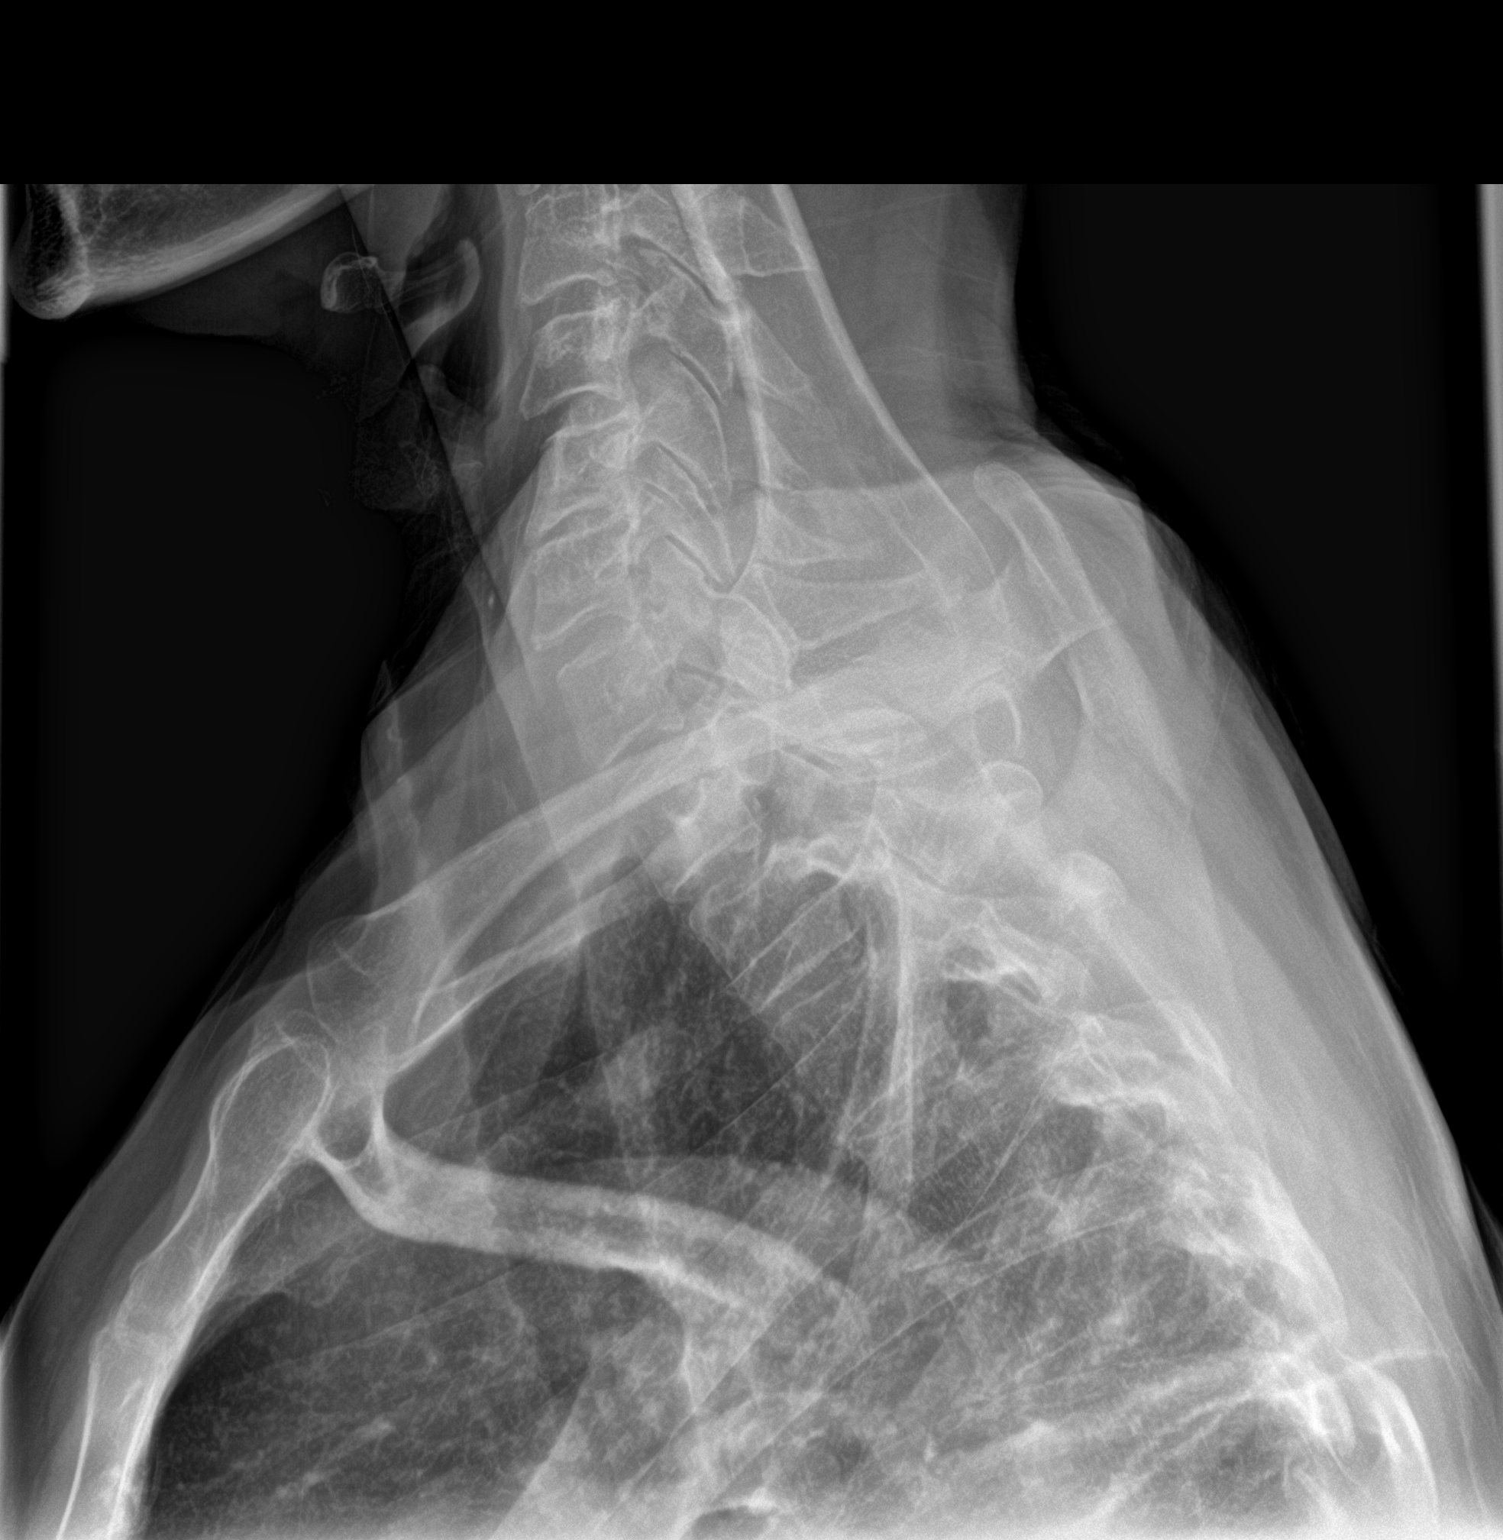

[3 of 3 positions shown; findings below may reference images not displayed]

FINDINGS: There is no evidence of thoracic spine fracture. Alignment is
normal. No other significant bone abnormalities are identified.
IMPRESSION: Negative exam.

## 2019-05-09 ENCOUNTER — Other Ambulatory Visit: Payer: Self-pay

## 2019-05-09 ENCOUNTER — Encounter: Payer: Self-pay | Admitting: Internal Medicine

## 2019-05-09 ENCOUNTER — Other Ambulatory Visit (INDEPENDENT_AMBULATORY_CARE_PROVIDER_SITE_OTHER): Payer: BC Managed Care – PPO

## 2019-05-09 ENCOUNTER — Ambulatory Visit (INDEPENDENT_AMBULATORY_CARE_PROVIDER_SITE_OTHER): Payer: BC Managed Care – PPO | Admitting: Internal Medicine

## 2019-05-09 VITALS — BP 110/80 | HR 72 | Temp 98.1°F | Ht 69.0 in | Wt 162.0 lb

## 2019-05-09 DIAGNOSIS — I8393 Asymptomatic varicose veins of bilateral lower extremities: Secondary | ICD-10-CM

## 2019-05-09 DIAGNOSIS — Z0000 Encounter for general adult medical examination without abnormal findings: Secondary | ICD-10-CM

## 2019-05-09 DIAGNOSIS — Z170 Estrogen receptor positive status [ER+]: Secondary | ICD-10-CM

## 2019-05-09 DIAGNOSIS — R209 Unspecified disturbances of skin sensation: Secondary | ICD-10-CM

## 2019-05-09 DIAGNOSIS — C50511 Malignant neoplasm of lower-outer quadrant of right female breast: Secondary | ICD-10-CM

## 2019-05-09 DIAGNOSIS — Z23 Encounter for immunization: Secondary | ICD-10-CM | POA: Diagnosis not present

## 2019-05-09 HISTORY — DX: Asymptomatic varicose veins of bilateral lower extremities: I83.93

## 2019-05-09 LAB — COMPREHENSIVE METABOLIC PANEL
ALT: 17 U/L (ref 0–35)
AST: 24 U/L (ref 0–37)
Albumin: 4.3 g/dL (ref 3.5–5.2)
Alkaline Phosphatase: 57 U/L (ref 39–117)
BUN: 19 mg/dL (ref 6–23)
CO2: 28 mEq/L (ref 19–32)
Calcium: 9.8 mg/dL (ref 8.4–10.5)
Chloride: 104 mEq/L (ref 96–112)
Creatinine, Ser: 0.9 mg/dL (ref 0.40–1.20)
GFR: 62.34 mL/min (ref >60.00)
Glucose, Bld: 84 mg/dL (ref 70–99)
Potassium: 4.2 mEq/L (ref 3.5–5.1)
Sodium: 139 mEq/L (ref 135–145)
Total Bilirubin: 0.4 mg/dL (ref 0.2–1.2)
Total Protein: 7.3 g/dL (ref 6.0–8.3)

## 2019-05-09 LAB — LIPID PANEL
Cholesterol: 199 mg/dL (ref 0–200)
HDL: 77.5 mg/dL (ref >39.00)
LDL Cholesterol: 97 mg/dL (ref 0–99)
NonHDL: 121.95
Total CHOL/HDL Ratio: 3
Triglycerides: 123 mg/dL (ref 0.0–149.0)
VLDL: 24.6 mg/dL (ref 0.0–40.0)

## 2019-05-09 LAB — TSH: TSH: 1.02 u[IU]/mL (ref 0.35–4.50)

## 2019-05-09 LAB — CBC
HCT: 36.9 % (ref 36.0–46.0)
Hemoglobin: 12.6 g/dL (ref 12.0–15.0)
MCHC: 34.1 g/dL (ref 30.0–36.0)
MCV: 92.1 fl (ref 78.0–100.0)
Platelets: 233 10*3/uL (ref 150.0–400.0)
RBC: 4.01 Mil/uL (ref 3.87–5.11)
RDW: 12.4 % (ref 11.5–15.5)
WBC: 5.6 10*3/uL (ref 4.0–10.5)

## 2019-05-09 LAB — VITAMIN B12: Vitamin B-12: 755 pg/mL (ref 211–911)

## 2019-05-09 LAB — VITAMIN D 25 HYDROXY (VIT D DEFICIENCY, FRACTURES): VITD: 47.58 ng/mL (ref 30.00–100.00)

## 2019-05-09 LAB — HEMOGLOBIN A1C: Hgb A1c MFr Bld: 5.7 % (ref 4.6–6.5)

## 2019-05-09 NOTE — Assessment & Plan Note (Signed)
Taking arimidex and DEXA due December which she will get. On fosamax.

## 2019-05-09 NOTE — Assessment & Plan Note (Signed)
Referral to vascular and vein. As she is not having swelling or pain we talked about how it is likely to be considered cosmetic by insurance company.

## 2019-05-09 NOTE — Progress Notes (Signed)
   Subjective:   Patient ID: Crystal [name], female    DOB: 03/26/1951, 68 y.o.   MRN: [id number]  HPI The patient is a 68 YO female coming in for physical.   PMH, [place], social history reviewed and updated  Review of Systems  Constitutional: Negative.   HENT: Negative.   Eyes: Negative.   Respiratory: Negative for cough, chest tightness and shortness of breath.   Cardiovascular: Negative for chest pain, palpitations and leg swelling.  Gastrointestinal: Negative for abdominal distention, abdominal pain, constipation, diarrhea, nausea and vomiting.  Musculoskeletal: Negative.   Skin: Negative.   Neurological: Negative.   Psychiatric/Behavioral: Negative.     Objective:  Physical Exam Constitutional:      Appearance: She is well-developed.  HENT:     Head: Normocephalic and atraumatic.  Neck:     Musculoskeletal: Normal range of motion.  Cardiovascular:     Rate and Rhythm: Normal rate and regular rhythm.  Pulmonary:     Effort: Pulmonary effort is normal. No respiratory distress.     Breath sounds: Normal breath sounds. No wheezing or rales.  Abdominal:     General: Bowel sounds are normal. There is no distension.     Palpations: Abdomen is soft.     Tenderness: There is no abdominal tenderness. There is no rebound.  Skin:    General: Skin is warm and dry.  Neurological:     Mental Status: She is alert and oriented to person, place, and time.     Coordination: Coordination normal.     Vitals:   05/09/19 0930  BP: 110/80  Pulse: 72  Temp: 98.1 F (36.7 C)  TempSrc: Oral  SpO2: 99%  Weight: 162 lb (73.5 kg)  Height: 5\' 9"  (1.753 m)    Assessment & Plan:  Flu and pneumonia 23 given at visit

## 2019-05-09 NOTE — Assessment & Plan Note (Signed)
Checking TSH, vitamin D and B12 today. Adjust as needed. Seeing neurology and considering second opinion for this. Does not wish to take meds at this time.

## 2019-05-09 NOTE — Patient Instructions (Addendum)
The shingles vaccine is one to think about. You get one shot and then a second 67-4 months later.   Plantar Fasciitis Rehab Ask your health care provider which exercises are safe for you. Do exercises exactly as told by your health care provider and adjust them as directed. It is normal to feel mild stretching, pulling, tightness, or discomfort as you do these exercises. Stop right away if you feel sudden pain or your pain gets worse. Do not begin these exercises until told by your health care provider. Stretching and range-of-motion exercises These exercises warm up your muscles and joints and improve the movement and flexibility of your foot. These exercises also help to relieve pain. Plantar fascia stretch  1. Sit with your left / right leg crossed over your opposite knee. 2. Hold your heel with one hand with that thumb near your arch. With your other hand, hold your toes and gently pull them back toward the top of your foot. You should feel a stretch on the bottom of your toes or your foot (plantar fascia) or both. 3. Hold this stretch for__________ seconds. 4. Slowly release your toes and return to the starting position. Repeat __________ times. Complete this exercise __________ times a day. Gastrocnemius stretch, standing This exercise is also called a calf (gastroc) stretch. It stretches the muscles in the back of the upper calf. 1. Stand with your hands against a wall. 2. Extend your left / right leg behind you, and bend your front knee slightly. 3. Keeping your heels on the floor and your back knee straight, shift your weight toward the wall. Do not arch your back. You should feel a gentle stretch in your upper left / right calf. 4. Hold this position for __________ seconds. Repeat __________ times. Complete this exercise __________ times a day. Soleus stretch, standing This exercise is also called a calf (soleus) stretch. It stretches the muscles in the back of the lower  calf. 1. Stand with your hands against a wall. 2. Extend your left / right leg behind you, and bend your front knee slightly. 3. Keeping your heels on the floor, bend your back knee and shift your weight slightly over your back leg. You should feel a gentle stretch deep in your lower calf. 4. Hold this position for __________ seconds. Repeat __________ times. Complete this exercise __________ times a day. Gastroc and soleus stretch, standing step This exercise stretches the muscles in the back of the lower leg. These muscles are in the upper calf (gastrocnemius) and the lower calf (soleus). 1. Stand with the ball of your left / right foot on a step. The ball of your foot is on the walking surface, right under your toes. 2. Keep your other foot firmly on the same step. 3. Hold on to the wall or a railing for balance. 4. Slowly lift your other foot, allowing your body weight to press your left / right heel down over the edge of the step. You should feel a stretch in your left / right calf. 5. Hold this position for __________ seconds. 6. Return both feet to the step. 7. Repeat this exercise with a slight bend in your left / right knee. Repeat __________ times with your left / right knee straight and __________ times with your left / right knee bent. Complete this exercise __________ times a day. Balance exercise This exercise builds your balance and strength control of your arch to help take pressure off your plantar fascia. Single leg stand  If this exercise is too easy, you can try it with your eyes closed or while standing on a pillow. 1. Without shoes, stand near a railing or in a doorway. You may hold on to the railing or door frame as needed. 2. Stand on your left / right foot. Keep your big toe down on the floor and try to keep your arch lifted. Do not let your foot roll inward. 3. Hold this position for __________ seconds. Repeat __________ times. Complete this exercise __________ times a  day. This information is not intended to replace advice given to you by your health care provider. Make sure you discuss any questions you have with your health care provider. Document Released: 06/16/2005 Document Revised: 10/07/2018 Document Reviewed: 04/14/2018 Elsevier Patient Education  2020 [place] Maintenance, Female Adopting a healthy lifestyle and getting preventive care are important in promoting health and wellness. Ask your health care provider about:  The right schedule for you to have regular tests and exams.  Things you can do on your own to prevent diseases and keep yourself healthy. What should I know about diet, weight, and exercise? Eat a healthy diet   Eat a diet that includes plenty of vegetables, fruits, low-fat dairy products, and lean protein.  Do not eat a lot of foods that are high in solid fats, added sugars, or sodium. Maintain a healthy weight Body mass index (BMI) is used to identify weight problems. It estimates body fat based on height and weight. Your health care provider can help determine your BMI and help you achieve or maintain a healthy weight. Get regular exercise Get regular exercise. This is one of the most important things you can do for your health. Most adults should:  Exercise for at least 150 minutes each week. The exercise should increase your heart rate and make you sweat (moderate-intensity exercise).  Do strengthening exercises at least twice a week. This is in addition to the moderate-intensity exercise.  Spend less time sitting. Even light physical activity can be beneficial. Watch cholesterol and blood lipids Have your blood tested for lipids and cholesterol at 67 years of age, then have this test every 5 years. Have your cholesterol levels checked more often if:  Your lipid or cholesterol levels are high.  You are older than 67 years of age.  You are at high risk for heart disease. What should I know about  cancer screening? Depending on your health history and family history, you may need to have cancer screening at various ages. This may include screening for:  Breast cancer.  Cervical cancer.  Colorectal cancer.  Skin cancer.  Lung cancer. What should I know about heart disease, diabetes, and high blood pressure? Blood pressure and heart disease  High blood pressure causes heart disease and increases the risk of stroke. This is more likely to develop in people who have high blood pressure readings, are of African descent, or are overweight.  Have your blood pressure checked: ? Every 3-5 years if you are 53-78 years of age. ? Every year if you are 57 years old or older. Diabetes Have regular diabetes screenings. This checks your fasting blood sugar level. Have the screening done:  Once every three years after age 72 if you are at a normal weight and have a low risk for diabetes.  More often and at a younger age if you are overweight or have a high risk for diabetes. What should I know about preventing infection?  Hepatitis B If you have a higher risk for hepatitis B, you should be screened for this virus. Talk with your health care provider to find out if you are at risk for hepatitis B infection. Hepatitis C Testing is recommended for:  Everyone born from [birth year] through 1964.  Anyone with known risk factors for hepatitis C. Sexually transmitted infections (STIs)  Get screened for STIs, including gonorrhea and chlamydia, if: ? You are sexually active and are younger than 67 years of age. ? You are older than 67 years of age and your health care provider tells you that you are at risk for this type of infection. ? Your sexual activity has changed since you were last screened, and you are at increased risk for chlamydia or gonorrhea. Ask your health care provider if you are at risk.  Ask your health care provider about whether you are at high risk for HIV. Your health care  provider may recommend a prescription medicine to help prevent HIV infection. If you choose to take medicine to prevent HIV, you should first get tested for HIV. You should then be tested every 3 months for as long as you are taking the medicine. Pregnancy  If you are about to stop having your period (premenopausal) and you may become pregnant, seek counseling before you get pregnant.  Take 400 to 800 micrograms (mcg) of folic acid every day if you become pregnant.  Ask for birth control (contraception) if you want to prevent pregnancy. Osteoporosis and menopause Osteoporosis is a disease in which the bones lose minerals and strength with aging. This can result in bone fractures. If you are 54 years old or older, or if you are at risk for osteoporosis and fractures, ask your health care provider if you should:  Be screened for bone loss.  Take a calcium or vitamin D supplement to lower your risk of fractures.  Be given hormone replacement therapy (HRT) to treat symptoms of menopause. Follow these instructions at home: Lifestyle  Do not use any products that contain nicotine or tobacco, such as cigarettes, e-cigarettes, and chewing tobacco. If you need help quitting, ask your health care provider.  Do not use street drugs.  Do not share needles.  Ask your health care provider for help if you need support or information about quitting drugs. Alcohol use  Do not drink alcohol if: ? Your health care provider tells you not to drink. ? You are pregnant, may be pregnant, or are planning to become pregnant.  If you drink alcohol: ? Limit how much you use to 0-1 drink a day. ? Limit intake if you are breastfeeding.  Be aware of how much alcohol is in your drink. In the [place] one drink equals one 12 oz bottle of beer (355 mL), one 5 oz glass of wine (148 mL), or one 1 oz glass of hard liquor (44 mL). General instructions  Schedule regular health, dental, and eye exams.  Stay current  with your vaccines.  Tell your health care provider if: ? You often feel depressed. ? You have ever been abused or do not feel safe at home. Summary  Adopting a healthy lifestyle and getting preventive care are important in promoting health and wellness.  Follow your health care provider's instructions about healthy diet, exercising, and getting tested or screened for diseases.  Follow your health care provider's instructions on monitoring your cholesterol and blood pressure. This information is not intended to replace advice given to you by your  health care provider. Make sure you discuss any questions you have with your health care provider. Document Released: 12/30/2010 Document Revised: 06/09/2018 Document Reviewed: 06/09/2018 Elsevier Patient Education  2020 [employer].

## 2019-05-09 NOTE — Assessment & Plan Note (Signed)
Flu shot given. Pneumonia given 23 to complete series. Shingrix counseled. Tetanus up to date. Colonoscopy up to date. Mammogram up to date, pap smear aged out and dexa due December. Counseled about sun safety and mole surveillance. Counseled about the dangers of distracted driving. Given 10 year screening recommendations.

## 2019-05-20 ENCOUNTER — Other Ambulatory Visit: Payer: Self-pay

## 2019-05-23 ENCOUNTER — Ambulatory Visit (INDEPENDENT_AMBULATORY_CARE_PROVIDER_SITE_OTHER): Payer: BC Managed Care – PPO | Admitting: Obstetrics & Gynecology

## 2019-05-23 ENCOUNTER — Encounter: Payer: Self-pay | Admitting: Obstetrics & Gynecology

## 2019-05-23 ENCOUNTER — Other Ambulatory Visit: Payer: Self-pay

## 2019-05-23 VITALS — BP 130/72 | Ht 69.0 in | Wt 161.0 lb

## 2019-05-23 DIAGNOSIS — N952 Postmenopausal atrophic vaginitis: Secondary | ICD-10-CM

## 2019-05-23 DIAGNOSIS — C50511 Malignant neoplasm of lower-outer quadrant of right female breast: Secondary | ICD-10-CM

## 2019-05-23 DIAGNOSIS — Z780 Asymptomatic menopausal state: Secondary | ICD-10-CM

## 2019-05-23 DIAGNOSIS — M8589 Other specified disorders of bone density and structure, multiple sites: Secondary | ICD-10-CM | POA: Diagnosis not present

## 2019-05-23 DIAGNOSIS — Z01419 Encounter for gynecological examination (general) (routine) without abnormal findings: Secondary | ICD-10-CM

## 2019-05-23 DIAGNOSIS — Z853 Personal history of malignant neoplasm of breast: Secondary | ICD-10-CM

## 2019-05-23 DIAGNOSIS — Z170 Estrogen receptor positive status [ER+]: Secondary | ICD-10-CM

## 2019-05-23 NOTE — Progress Notes (Signed)
Crystal [name] Dec 11, 1950 [id number]   History:    68 y.o. G2P2L2 Married  RP:  Established patient presenting for annual gyn exam   HPI: Postmenopausal, well on no hormone replacement therapy.  No postmenopausal bleeding.  History of right breast cancer in situ in 2016, status post lumpectomy with radiation therapy.  On anastrozole for 4 years.  Followed by Dr. Jana Hakim.  No pelvic pain.  Rarely sexually active because of pain and dryness.  Did not try with coconut oil so far.  Recurrent cystitis on ABTx prophylaxis, only 1 Cystitis since last year, followed by Dr Matilde Sprang.  Bowel movements normal.  Health labs with family physician.  Good body mass index at 23.78.  Enjoys yoga.  Walking.  Past medical history,surgical history, family history and social history were all reviewed and documented in the EPIC chart.  Gynecologic History No LMP recorded. Patient is postmenopausal. Contraception: post menopausal status Last Pap: 01/2017. Results were: Negative/HPV HR negatvie Last mammogram: 11/2018. Results were: Benign Bone Density: 05/2017 Osteopenia on Fosamax Colonoscopy: 2015  Obstetric History OB History  Gravida Para Term Preterm AB Living  2 2       2   SAB TAB Ectopic Multiple Live Births               # Outcome Date GA Lbr Len/2nd Weight Sex Delivery Anes PTL Lv  2 Para           1 Para              ROS: A ROS was performed and pertinent positives and negatives are included in the history.  GENERAL: No fevers or chills. HEENT: No change in vision, no earache, sore throat or sinus congestion. NECK: No pain or stiffness. CARDIOVASCULAR: No chest pain or pressure. No palpitations. PULMONARY: No shortness of breath, cough or wheeze. GASTROINTESTINAL: No abdominal pain, nausea, vomiting or diarrhea, melena or bright red blood per rectum. GENITOURINARY: No urinary frequency, urgency, hesitancy or dysuria. MUSCULOSKELETAL: No joint or muscle pain, no back pain, no recent  trauma. DERMATOLOGIC: No rash, no itching, no lesions. ENDOCRINE: No polyuria, polydipsia, no heat or cold intolerance. No recent change in weight. HEMATOLOGICAL: No anemia or easy bruising or bleeding. NEUROLOGIC: No headache, seizures, numbness, tingling or weakness. PSYCHIATRIC: No depression, no loss of interest in normal activity or change in sleep pattern.     Exam:   BP 130/72   Ht 5\' 9"  (1.753 m)   Wt 161 lb (73 kg)   BMI 23.78 kg/m   Body mass index is 23.78 kg/m.  General appearance : Well developed well nourished female. No acute distress HEENT: Eyes: no retinal hemorrhage or exudates,  Neck supple, trachea midline, no carotid bruits, no thyroidmegaly Lungs: Clear to auscultation, no rhonchi or wheezes, or rib retractions  Heart: Regular rate and rhythm, no murmurs or gallops Breast:Examined in sitting and supine position were symmetrical in appearance, no palpable masses or tenderness,  no skin retraction, no nipple inversion, no nipple discharge, no skin discoloration, no axillary or supraclavicular lymphadenopathy Abdomen: no palpable masses or tenderness, no rebound or guarding Extremities: no edema or skin discoloration or tenderness  Pelvic: Vulva: Normal             Vagina: No gross lesions or discharge  Cervix: No gross lesions or discharge  Uterus  AV, normal size, shape and consistency, non-tender and mobile  Adnexa  Without masses or tenderness  Anus: Normal   Assessment/Plan:  68  y.o. female for annual exam   1. Well female exam with routine gynecological exam Normal gynecologic exam.  Pap test in August 2018 was negative with negative high-risk HPV, will repeat a Pap test next year.  Breast exam normal.  Screening mammogram benign in June 2020.  Health labs with family physician.  Good body mass index at 23.78.  Continue with fitness and healthy nutrition.  2. Postmenopausal Well on no HRT.  No PMB.  3. Post-menopausal atrophic vaginitis Recommend  Coconut oil.  4. Osteopenia of multiple sites Stable BD 05/2017 on Fosamax.  Will repeat a BD at [facility] now.  5. 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  Followed by Dr Jana Hakim.  Princess Bruins MD, 8:52 AM 05/23/2019

## 2019-05-23 NOTE — Patient Instructions (Signed)
1. Well female exam with routine gynecological exam Normal gynecologic exam.  Pap test in August 2018 was negative with negative high-risk HPV, will repeat a Pap test next year.  Breast exam normal.  Screening mammogram benign in June 2020.  Health labs with family physician.  Good body mass index at 23.78.  Continue with fitness and healthy nutrition.  2. Postmenopausal Well on no HRT.  No PMB.  3. Post-menopausal atrophic vaginitis Recommend Coconut oil.  4. Osteopenia of multiple sites Stable BD 05/2017 on Fosamax.  Will repeat a BD at [facility] now.  5. Malignant neoplasm of lower-outer quadrant of right breast of female, estrogen receptor positive ([place]) On Anastrazole.  Followed by Dr Jana Hakim.  Crystal [name], it was a pleasure seeing you today!

## 2019-06-28 ENCOUNTER — Encounter: Payer: Self-pay | Admitting: Hematology and Oncology

## 2019-06-28 LAB — HM DEXA SCAN: HM Dexa Scan: -1.9

## 2019-07-06 ENCOUNTER — Encounter: Payer: Self-pay | Admitting: Gynecology

## 2019-07-18 ENCOUNTER — Other Ambulatory Visit: Payer: Self-pay

## 2019-07-18 DIAGNOSIS — I8393 Asymptomatic varicose veins of bilateral lower extremities: Secondary | ICD-10-CM

## 2019-07-19 ENCOUNTER — Other Ambulatory Visit: Payer: Self-pay

## 2019-07-19 ENCOUNTER — Ambulatory Visit (HOSPITAL_COMMUNITY)
Admission: RE | Admit: 2019-07-19 | Discharge: 2019-07-19 | Disposition: A | Discharge status: Home / Self Care | Payer: BC Managed Care – PPO | Source: Ambulatory Visit | Attending: Surgery | Admitting: Surgery

## 2019-07-19 DIAGNOSIS — I8393 Asymptomatic varicose veins of bilateral lower extremities: Secondary | ICD-10-CM | POA: Insufficient documentation

## 2019-07-20 ENCOUNTER — Encounter (HOSPITAL_COMMUNITY): Payer: BC Managed Care – PPO

## 2019-07-20 ENCOUNTER — Ambulatory Visit: Payer: BC Managed Care – PPO | Admitting: Vascular Surgery

## 2019-07-20 ENCOUNTER — Encounter: Payer: Self-pay | Admitting: Vascular Surgery

## 2019-07-20 VITALS — BP 131/78 | HR 94 | Temp 97.9°F | Resp 20 | Ht 69.0 in | Wt 165.0 lb

## 2019-07-20 DIAGNOSIS — I83813 Varicose veins of bilateral lower extremities with pain: Secondary | ICD-10-CM

## 2019-07-20 DIAGNOSIS — I872 Venous insufficiency (chronic) (peripheral): Secondary | ICD-10-CM

## 2019-07-20 NOTE — Progress Notes (Signed)
REASON FOR CONSULT:    Varicose veins.  The consult is requested by Dr. Harlow Mares.  ASSESSMENT & PLAN:   CHRONIC VENOUS DISEASE: This patient does have some deep venous reflux on the left and also superficial venous reflux bilaterally.  He has varicose veins in the left thigh and spider and telangiectasias bilaterally.  We have discussed the importance of intermittent leg elevation and the proper positioning for this.  I have given her a prescription for knee-high compression stockings with a gradient of 15 to 20 mmHg.  I encouraged her to avoid prolonged sitting and standing.  We discussed the importance of walking specifically walking and water aerobics.  We did discuss sclerotherapy which I think she would be a good candidate for with respect to her spider veins.  I did explain however the given that she has some underlying venous disease and superficial venous reflux she may be at higher risk for recurrence.  If her venous disease progresses in the future I think we could restudy her. She does have superficial venous reflux in the great saphenous veins bilaterally however currently the veins are not especially dilated.   Deitra Mayo, MD Office: [phone]   HPI:   Crystal [name] is a pleasant 69 y.o. female, who was referred with bilateral spider veins and varicose veins of the left leg.  She denies any significant symptoms associated with her venous disease.  She denies aching pain or heaviness.  She denies significant swelling.  She does have a history of neuropathy.  She is had no previous vein procedures.  She is had no previous history of DVT.  She does not wear compression stockings.  She does elevate her legs some.  Past Medical History:  Diagnosis Date  . BACK PAIN, LUMBAR   . Breast cancer, right breast   . FIBROADENOMA, BREAST 2006  . PARESTHESIA    toes  . PONV (postoperative nausea and vomiting)   . Radiation 11/23/14-12/25/14   right breast 42.72 gray, lumpectomy  cavity boosted to 54.72 gray  . Wears glasses     Family History  Problem Relation Age of Onset  . Hypothyroidism Mother   . GER disease Mother   . Osteoporosis Mother   . Hypertension Mother   . Cancer Mother        hodgkins lympoma, uterine cancer  . Arrhythmia Mother   . Hyperlipidemia Father   . Hypertension Father   . Diabetes Father   . Coronary artery disease Father   . Coronary artery disease Maternal Grandfather   . COPD Neg Hx   . Asthma Neg Hx   . Colon cancer Neg Hx   . Esophageal cancer Neg Hx   . Rectal cancer Neg Hx   . Stomach cancer Neg Hx     SOCIAL HISTORY: Social History   Socioeconomic History  . Marital status: Married    Spouse name: howard  . Number of children: 2  . Years of education: 87  . Highest education level: Not on file  Occupational History  . Occupation: PROPERTY MGR    Employer: [employer]  Tobacco Use  . Smoking status: Never Smoker  . Smokeless tobacco: Never Used  Substance and Sexual Activity  . Alcohol use: Yes    Alcohol/week: 5.0 standard drinks    Types: 5 Standard drinks or equivalent per week    Comment: 2 beers/week  . Drug use: No  . Sexual activity: Yes    Partners: Male  Comment: 1st intercourse- 84, partners-  married-  9 yrs   Other Topics Concern  . Not on file  Social History Narrative   HSG, [place], Married '78,  1 son '81, 1 daughter '84, 1 grandson '12.    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 ACP - discussed with patient.    Refer - [email or web address]   Highest level of education:  BS   Social Determinants of Health   Financial Resource Strain:   . Difficulty of Paying Living Expenses: Not on file  Food Insecurity:   . Worried About Charity fundraiser in the Last Year: Not on file  . Ran Out of Food in the Last Year: Not on file  Transportation Needs:   . Lack of Transportation (Medical): Not on file  . Lack of Transportation  (Non-Medical): Not on file  Physical Activity:   . Days of Exercise per Week: Not on file  . Minutes of Exercise per Session: Not on file  Stress:   . Feeling of Stress : Not on file  Social Connections:   . Frequency of Communication with Friends and Family: Not on file  . Frequency of Social Gatherings with Friends and Family: Not on file  . Attends Religious Services: Not on file  . Active Member of Clubs or Organizations: Not on file  . Attends Archivist Meetings: Not on file  . Marital Status: Not on file  Intimate Partner Violence:   . Fear of Current or Ex-Partner: Not on file  . Emotionally Abused: Not on file  . Physically Abused: Not on file  . Sexually Abused: Not on file    No Known Allergies  Current Outpatient Medications  Medication Sig Dispense Refill  . alendronate (FOSAMAX) 70 MG tablet take 1 tablet by mouth every week on an empty stomach with A 12 tablet 3  . anastrozole (ARIMIDEX) 1 MG tablet Take 1 tablet (1 mg total) by mouth daily. 90 tablet 3  . Calcium Carbonate-Vitamin D (CALCIUM 600-D) 600-400 MG-UNIT per tablet Take 1 tablet by mouth daily.     . Cholecalciferol (VITAMIN D3) 1000 UNITS CAPS Take by mouth daily.      Marland Kitchen ibuprofen (ADVIL,MOTRIN) 200 MG tablet Take 200-400 mg by mouth daily as needed for headache.    . loratadine (CLARITIN) 10 MG tablet     . Multiple Vitamin (MULTIVITAMIN) tablet Take 1 tablet by mouth daily.      Vladimir Faster Glycol-Propyl Glycol (SYSTANE OP) Apply 1 drop to eye as needed (For dryness.).    Marland Kitchen Probiotic Product (FORTIFY DAILY PROBIOTIC PO) Take 550 mg by mouth daily.    Marland Kitchen trimethoprim (TRIMPEX) 100 MG tablet      No current facility-administered medications for this visit.    REVIEW OF SYSTEMS:  [X]  denotes positive finding, [ ]  denotes negative finding Cardiac  Comments:  Chest pain or chest pressure:    Shortness of breath upon exertion:    Short of breath when lying flat:    Irregular heart rhythm:         Vascular    Pain in calf, thigh, or hip brought on by ambulation:    Pain in feet at night that wakes you up from your sleep:     Blood clot in your veins:    Leg swelling:         Pulmonary    Oxygen at home:    Productive cough:  Wheezing:         Neurologic    Sudden weakness in arms or legs:     Sudden numbness in arms or legs:     Sudden onset of difficulty speaking or slurred speech:    Temporary loss of vision in one eye:     Problems with dizziness:         Gastrointestinal    Blood in stool:     Vomited blood:         Genitourinary    Burning when urinating:     Blood in urine:        Psychiatric    Major depression:         Hematologic    Bleeding problems:    Problems with blood clotting too easily:        Skin    Rashes or ulcers:        Constitutional    Fever or chills:     PHYSICAL EXAM:   Vitals:   07/20/19 0836  BP: 131/78  Pulse: 94  Resp: 20  Temp: 97.9 F (36.6 C)  SpO2: 100%  Weight: 165 lb (74.8 kg)  Height: 5\' 9"  (1.753 m)    GENERAL: The patient is a well-nourished female, in no acute distress. The vital signs are documented above. CARDIAC: There is a regular rate and rhythm.  VASCULAR: I do not detect carotid bruits. She has palpable femoral, dorsalis pedis, and posterior tibial pulses bilaterally. She has spider veins bilaterally as documented below. She has some varicose veins in her medial left thigh.        PULMONARY: There is good air exchange bilaterally without wheezing or rales. ABDOMEN: Soft and non-tender with normal pitched bowel sounds.  MUSCULOSKELETAL: There are no major deformities or cyanosis. NEUROLOGIC: No focal weakness or paresthesias are detected. SKIN: There are no ulcers or rashes noted. PSYCHIATRIC: The patient has a normal affect.  DATA:    VENOUS DUPLEX: I have reviewed her venous reflux study from yesterday.  On the right side, there is no evidence of DVT.  There is no deep venous  reflux on the right.  She has superficial venous reflux on the right involving the great saphenous vein and small saphenous vein.  However these veins are not especially dilated.  On the left side she has no evidence of DVT.  She has deep venous reflux involving the common femoral vein.  She has superficial venous reflux involving the left great saphenous vein and also the small saphenous vein.  The vein is not especially dilated.  The largest diameter was 0.46 cm.  I looked myself with the Sono[employer] and confirmed these measurements.

## 2019-07-31 ENCOUNTER — Ambulatory Visit: Payer: BC Managed Care – PPO

## 2019-08-05 ENCOUNTER — Ambulatory Visit: Payer: BC Managed Care – PPO

## 2019-08-30 ENCOUNTER — Ambulatory Visit (INDEPENDENT_AMBULATORY_CARE_PROVIDER_SITE_OTHER): Payer: Self-pay

## 2019-08-30 ENCOUNTER — Other Ambulatory Visit: Payer: Self-pay

## 2019-08-30 DIAGNOSIS — I8393 Asymptomatic varicose veins of bilateral lower extremities: Secondary | ICD-10-CM

## 2019-08-30 NOTE — Progress Notes (Signed)
Treated bilateral front and back of legs for spider and small reticular veins with Asclera 1%, aministered with a 27g butterfly.  Patient received a total of 64mL. Easy access. Pt. Tolerated well. Anticipate good results. Discussed with pt the possibility for another treatment in the future. Post procedure care instructions provided both verbally and on handout. Will follow PRN.   Photos: Yes.     Compression stockings applied: Yes.

## 2019-09-02 ENCOUNTER — Ambulatory Visit: Payer: BLUE CROSS/BLUE SHIELD | Admitting: Neurology

## 2019-09-15 ENCOUNTER — Telehealth: Payer: Self-pay

## 2019-09-15 NOTE — Telephone Encounter (Signed)
Called pt to f/u on sclerotherapy tx from 2 weeks ago. Pt is still wanting to wear her compression stockings. Feels like she is seeing definite improvement in her spider veins. She may be interested in another tx in the fall/winter.

## 2019-12-22 LAB — HM MAMMOGRAPHY

## 2019-12-29 ENCOUNTER — Other Ambulatory Visit: Payer: Self-pay | Admitting: Hematology and Oncology

## 2020-01-05 ENCOUNTER — Ambulatory Visit (INDEPENDENT_AMBULATORY_CARE_PROVIDER_SITE_OTHER): Payer: Medicare Other | Admitting: Internal Medicine

## 2020-01-05 ENCOUNTER — Ambulatory Visit: Payer: BC Managed Care – PPO | Admitting: Internal Medicine

## 2020-01-05 ENCOUNTER — Other Ambulatory Visit: Payer: Self-pay

## 2020-01-05 ENCOUNTER — Encounter: Payer: Self-pay | Admitting: Internal Medicine

## 2020-01-05 VITALS — BP 140/80 | HR 78 | Temp 98.3°F | Ht 69.0 in | Wt 156.0 lb

## 2020-01-05 DIAGNOSIS — R002 Palpitations: Secondary | ICD-10-CM | POA: Diagnosis not present

## 2020-01-05 DIAGNOSIS — I4891 Unspecified atrial fibrillation: Secondary | ICD-10-CM

## 2020-01-05 LAB — COMPREHENSIVE METABOLIC PANEL
AG Ratio: 1.5 (calc) (ref 1.0–2.5)
ALT: 15 U/L (ref 6–29)
AST: 19 U/L (ref 10–35)
Albumin: 4.4 g/dL (ref 3.6–5.1)
Alkaline phosphatase (APISO): 56 U/L (ref 37–153)
BUN/Creatinine Ratio: 17 (calc) (ref 6–22)
BUN: 19 mg/dL (ref 7–25)
CO2: 32 mmol/L (ref 20–32)
Calcium: 10.4 mg/dL (ref 8.6–10.4)
Chloride: 101 mmol/L (ref 98–110)
Creat: 1.11 mg/dL — ABNORMAL HIGH (ref 0.50–0.99)
Globulin: 2.9 g/dL (calc) (ref 1.9–3.7)
Glucose, Bld: 120 mg/dL — ABNORMAL HIGH (ref 65–99)
Potassium: 4.1 mmol/L (ref 3.5–5.3)
Sodium: 139 mmol/L (ref 135–146)
Total Bilirubin: 0.4 mg/dL (ref 0.2–1.2)
Total Protein: 7.3 g/dL (ref 6.1–8.1)

## 2020-01-05 LAB — CBC
HCT: 39.5 % (ref 35.0–45.0)
Hemoglobin: 13 g/dL (ref 11.7–15.5)
MCH: 30.2 pg (ref 27.0–33.0)
MCHC: 32.9 g/dL (ref 32.0–36.0)
MCV: 91.6 fL (ref 80.0–100.0)
MPV: 10.3 fL (ref 7.5–12.5)
Platelets: 271 10*3/uL (ref 140–400)
RBC: 4.31 10*6/uL (ref 3.80–5.10)
RDW: 11.7 % (ref 11.0–15.0)
WBC: 7 10*3/uL (ref 3.8–10.8)

## 2020-01-05 LAB — T4, FREE: Free T4: 1.4 ng/dL (ref 0.8–1.8)

## 2020-01-05 LAB — TSH: TSH: 0.08 mIU/L — ABNORMAL LOW (ref 0.40–4.50)

## 2020-01-05 MED ORDER — APIXABAN 5 MG PO TABS: 5.0000 mg | ORAL_TABLET | Freq: Two times a day (BID) | ORAL | 3 refills | Status: DC

## 2020-01-05 MED ORDER — METOPROLOL SUCCINATE ER 25 MG PO TB24
25.0000 mg | ORAL_TABLET | Freq: Every day | ORAL | 3 refills | Status: DC
Start: 2020-01-05 — End: 2020-07-04

## 2020-01-05 NOTE — Patient Instructions (Signed)
We have sent in the eliquis which is the blood thinner to take 1 pill twice a day.   We have sent in the metoprolol which helps the heart rate from going up to take 1 pill daily.  We will get you in with the cardiologist (have asked for [facility]).

## 2020-01-05 NOTE — Progress Notes (Signed)
   Subjective:   Patient ID: Crystal [name], female    DOB: 1950-09-18, 69 y.o.   MRN: [id number]  HPI The patient is a 69 YO female coming in for palpitations. Started happening over the past year and was very rare and lasted less than 5-10 minutes and feels like she is having it now. She has noticed recently that this is more often and sometimes can last for a short amount of time and sometimes can last for an hour or so. She has tried to feel her pulse at those times and it does not feel regular. She also sometimes can see her chest beating in a weird pattern. She denies chest pains. Has had a rare chest pressure during these episodes which she felt were gas. She denies that exertion brings this on or other triggers. Does not drink alcohol and 1 cup coffee daily for caffeine intake which has not changed. Denies SOB or fatigue during or after episodes.   PMH, [facility], social history reviewed and updated  Review of Systems  Constitutional: Negative.   HENT: Negative.   Eyes: Negative.   Respiratory: Negative for cough, chest tightness and shortness of breath.   Cardiovascular: Positive for palpitations. Negative for chest pain and leg swelling.  Gastrointestinal: Negative for abdominal distention, abdominal pain, constipation, diarrhea, nausea and vomiting.  Musculoskeletal: Negative.   Skin: Negative.   Neurological: Negative.   Psychiatric/Behavioral: Negative.     Objective:  Physical Exam Constitutional:      Appearance: She is well-developed.  HENT:     Head: Normocephalic and atraumatic.  Cardiovascular:     Rate and Rhythm: Tachycardia present. Rhythm irregular.  Pulmonary:     Effort: Pulmonary effort is normal. No respiratory distress.     Breath sounds: Normal breath sounds. No wheezing or rales.  Abdominal:     General: Bowel sounds are normal. There is no distension.     Palpations: Abdomen is soft.     Tenderness: There is no abdominal tenderness. There is no rebound.    Musculoskeletal:     Cervical back: Normal range of motion.  Skin:    General: Skin is warm and dry.  Neurological:     Mental Status: She is alert and oriented to person, place, and time.     Coordination: Coordination normal.     Vitals:   01/05/20 1418  BP: 140/80  Pulse: 78  Temp: 98.3 F (36.8 C)  TempSrc: Oral  SpO2: 97%  Weight: 156 lb (70.8 kg)  Height: 5\' 9"  (1.753 m)   EKG: Rate 144, axis normal, intervals stable, A fib with rvr, no clear new ST changes, significant change from past EKG 2019 which is sinus without changes  This visit occurred during the SARS-CoV-2 public health emergency.  Safety protocols were in place, including screening questions prior to the visit, additional usage of staff PPE, and extensive cleaning of exam room while observing appropriate contact time as indicated for disinfecting solutions.   Assessment & Plan:  Visit time 25 minutes in face to face communication with patient and coordination of care, additional 5 minutes spent in record review, coordination or care, ordering tests, communicating/referring to other healthcare professionals, documenting in medical records all on the same day of the visit for total time 30 minutes spent on the visit.

## 2020-01-06 DIAGNOSIS — I480 Paroxysmal atrial fibrillation: Secondary | ICD-10-CM | POA: Insufficient documentation

## 2020-01-06 DIAGNOSIS — R002 Palpitations: Secondary | ICD-10-CM

## 2020-01-06 DIAGNOSIS — I4891 Unspecified atrial fibrillation: Secondary | ICD-10-CM

## 2020-01-06 HISTORY — DX: Unspecified atrial fibrillation: I48.91

## 2020-01-06 HISTORY — DX: Palpitations: R00.2

## 2020-01-06 NOTE — Assessment & Plan Note (Signed)
EKG done due to symptoms during visit which did reveal new A fib with RVR. HR 144. See separate problem for full details on plan.

## 2020-01-06 NOTE — Assessment & Plan Note (Signed)
Rx metoprolol 25 mg daily and eliquis BID. Referral to cardiology. She does want specific cardiologist as she is friends with him if possible Dr. Burt Knack. Her husband has recently been diagnosed and through A fib diagnosis so she is familiar with this although we did discuss the mechanism and risks of A fib. CHADs score 2 due to age and gender. Overall she is well and asymptomatic. It is possible that she has had PAF for a year or more at this time. Since she is not symptomatic it is unclear she could have persistent A fib. Last EKD 2019 without A fib and no prior episodes of this known in the past. Checking TSH and CBC, free T4 to rule out metabolic cause.

## 2020-01-10 ENCOUNTER — Telehealth: Payer: Self-pay | Admitting: Cardiovascular Disease

## 2020-01-10 NOTE — Telephone Encounter (Signed)
    Pt said she is neighbors with Dr. Burt Knack and per Dr. Burt Knack he can see family and Friends as new patients. We received a referral on 07/08 and she preferred to see Dr. Burt Knack

## 2020-01-10 NOTE — Telephone Encounter (Signed)
Will send to Dr. Antionette Char nurse.

## 2020-01-11 NOTE — Telephone Encounter (Signed)
Scheduled patient for evaluation with Dr. Burt Knack this Friday. She was grateful for assistance.

## 2020-01-13 ENCOUNTER — Ambulatory Visit (INDEPENDENT_AMBULATORY_CARE_PROVIDER_SITE_OTHER): Payer: Medicare Other | Admitting: Cardiovascular Disease

## 2020-01-13 ENCOUNTER — Other Ambulatory Visit: Payer: Self-pay

## 2020-01-13 ENCOUNTER — Telehealth: Payer: Self-pay | Admitting: Radiology

## 2020-01-13 ENCOUNTER — Encounter: Payer: Self-pay | Admitting: Cardiovascular Disease

## 2020-01-13 VITALS — BP 128/80 | HR 75 | Ht 70.0 in | Wt 156.8 lb

## 2020-01-13 DIAGNOSIS — I480 Paroxysmal atrial fibrillation: Secondary | ICD-10-CM

## 2020-01-13 NOTE — Telephone Encounter (Signed)
Enrolled patient for a 14 day Zio monitor to be mailed to patients home.  

## 2020-01-13 NOTE — Progress Notes (Signed)
Cardiology Office Note:    Date:  01/13/2020   ID:  LIBI CORSO, DOB July 18, 1950, MRN [id number]  PCP:  Hoyt Koch, MD  [facility] Cardiologist:  Sherren Mocha, MD  [place] Electrophysiologist:  None   Referring MD: Hoyt Koch, *   No chief complaint on file.   History of Present Illness:    Crystal [name] is a 69 y.o. female referred for evaluation of new atrial fibrillation.   She has experienced tachypalpitations over the past few years. These symptoms occur more frequently over the past several months. When she recently saw Dr Sharlet Salina, she was found to atrial fibrillation with RVR. She was started on metoprolol succinate at that recent office visit and feels like she's only had one episode since that time. She was also started on apixaban at her recent visit.   She otherwise has been well. She denies exertional chest pain or pressure, dyspnea, orthopnea, or PND. She has mild lightheadedness when she experiences palpitations. No history of syncope.   Past Medical History:  Diagnosis Date  . BACK PAIN, LUMBAR   . Breast cancer, right breast   . FIBROADENOMA, BREAST 2006  . PARESTHESIA    toes  . PONV (postoperative nausea and vomiting)   . Radiation 11/23/14-12/25/14   right breast 42.72 gray, lumpectomy cavity boosted to 54.72 gray  . Wears glasses     Past Surgical History:  Procedure Laterality Date  . atrophic vaginitis    . BREAST LUMPECTOMY WITH RADIOACTIVE SEED LOCALIZATION Right 10/18/2014   Procedure: BREAST LUMPECTOMY WITH RADIOACTIVE SEED LOCALIZATION;  Surgeon: Coralie Keens, MD;  Location: [facility];  Service: General;  Laterality: Right;  . COLONOSCOPY  09/05/2013  . D & C and hysteroscopy  11/1998   for uterine polyp  . DILATATION & CURRETTAGE/HYSTEROSCOPY WITH RESECTOCOPE N/A 05/30/2015   Procedure: DILATATION & CURETTAGE/HYSTEROSCOPY WITH RESECTOCOPE;  Surgeon: Princess Bruins, MD;  Location: [facility];  Service: Gynecology;  Laterality: N/A;  . DILATION AND CURETTAGE OF UTERUS    . Sterotactix needle biopsy  2000   breast calcification    Current Medications: Current Meds  Medication Sig  . acetaminophen (TYLENOL) 500 MG tablet Take 500 mg by mouth as needed.  Marland Kitchen alendronate (FOSAMAX) 70 MG tablet TAKE 1 TABLET EACH WEEK WITH 6-8 OZ OF WATER, NO FOOD OR MEDS FOR 30 MIN, REMAIN UPRIGHT.  Marland Kitchen anastrozole (ARIMIDEX) 1 MG tablet Take 1 tablet (1 mg total) by mouth daily.  Marland Kitchen apixaban (ELIQUIS) 5 MG TABS tablet Take 1 tablet (5 mg total) by mouth 2 (two) times daily.  . Calcium Carbonate-Vitamin D (CALCIUM 600-D) 600-400 MG-UNIT per tablet Take 1 tablet by mouth daily.   . Cholecalciferol (VITAMIN D3) 1000 UNITS CAPS Take by mouth daily.    . metoprolol succinate (TOPROL-XL) 25 MG 24 hr tablet Take 1 tablet (25 mg total) by mouth daily.  . Multiple Vitamin (MULTIVITAMIN) tablet Take 1 tablet by mouth daily.    Vladimir Faster Glycol-Propyl Glycol (SYSTANE OP) Apply 1 drop to eye as needed (For dryness.).  Marland Kitchen Probiotic Product (FORTIFY DAILY PROBIOTIC PO) Take 550 mg by mouth daily.  Marland Kitchen trimethoprim (TRIMPEX) 100 MG tablet      Allergies:   Patient has no known allergies.   Social History   Socioeconomic History  . Marital status: Married    Spouse name: howard  . Number of children: 2  . Years of education: 16  . Highest education level:  Not on file  Occupational History  . Occupation: PROPERTY MGR    Employer: [employer]  Tobacco Use  . Smoking status: Never Smoker  . Smokeless tobacco: Never Used  Vaping Use  . Vaping Use: Never used  Substance and Sexual Activity  . Alcohol use: Yes    Alcohol/week: 5.0 standard drinks    Types: 5 Standard drinks or equivalent per week    Comment: 2 beers/week  . Drug use: No  . Sexual activity: Yes    Partners: Male    Comment: 1st intercourse- 69, partners-  married-  23 yrs   Other Topics Concern  . Not on file  Social  History Narrative   HSG, [place], Married '78,  1 son '81, 1 daughter '84, 1 grandson '12.    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 ACP - discussed with patient.    Refer - [email or web address]   Highest level of education:  BS   Social Determinants of Health   Financial Resource Strain:   . Difficulty of Paying Living Expenses:   Food Insecurity:   . Worried About Charity fundraiser in the Last Year:   . Arboriculturist in the Last Year:   Transportation Needs:   . Film/video editor (Medical):   Marland Kitchen Lack of Transportation (Non-Medical):   Physical Activity:   . Days of Exercise per Week:   . Minutes of Exercise per Session:   Stress:   . Feeling of Stress :   Social Connections:   . Frequency of Communication with Friends and Family:   . Frequency of Social Gatherings with Friends and Family:   . Attends Religious Services:   . Active Member of Clubs or Organizations:   . Attends Archivist Meetings:   Marland Kitchen Marital Status:      Family History: The patient's family history includes Arrhythmia in her mother; Cancer in her mother; Coronary artery disease in her father and maternal grandfather; Diabetes in her father; GER disease in her mother; Hyperlipidemia in her father; Hypertension in her father and mother; Hypothyroidism in her mother; Osteoporosis in her mother. There is no history of COPD, Asthma, Colon cancer, Esophageal cancer, Rectal cancer, or Stomach cancer.  Her mother had atrial fibrillation.  ROS:   Please see the history of present illness.    All other systems reviewed and are negative.  EKGs/Labs/Other Studies Reviewed:    EKG:  EKG is ordered today.  The ekg ordered today demonstrates NSR 66 bpm, within normal limits  Recent Labs: 01/05/2020: ALT 15; BUN 19; Creat 1.11; Hemoglobin 13.0; Platelets 271; Potassium 4.1; Sodium 139; TSH 0.08  Recent Lipid Panel    Component Value Date/Time   CHOL 199  05/09/2019 1005   TRIG 123.0 05/09/2019 1005   HDL 77.50 05/09/2019 1005   CHOLHDL 3 05/09/2019 1005   VLDL 24.6 05/09/2019 1005   LDLCALC 97 05/09/2019 1005    Physical Exam:    VS:  BP 128/80   Pulse 75   Ht 5\' 10"  (1.778 m)   Wt 156 lb 12.8 oz (71.1 kg)   SpO2 96%   BMI 22.50 kg/m     Wt Readings from Last 3 Encounters:  01/13/20 156 lb 12.8 oz (71.1 kg)  01/05/20 156 lb (70.8 kg)  07/20/19 165 lb (74.8 kg)     GEN:  Well nourished, well developed in no acute distress HEENT: Normal NECK: No JVD;  No carotid bruits LYMPHATICS: No lymphadenopathy CARDIAC: RRR, no murmurs, rubs, gallops RESPIRATORY:  Clear to auscultation without rales, wheezing or rhonchi  ABDOMEN: Soft, non-tender, non-distended MUSCULOSKELETAL:  No edema; No deformity  SKIN: Warm and dry NEUROLOGIC:  Alert and oriented x 3 PSYCHIATRIC:  Normal affect   ASSESSMENT:    1. Paroxysmal atrial fibrillation (HCC)    PLAN:    In order of problems listed above:  1. The patient has new onset paroxysmal atrial fibrillation.  She is in normal sinus rhythm today.  In fact, she seems to be doing better since starting on metoprolol succinate 25 mg daily.  I am unable to reviewed the EKG from her recent primary care office visit, but it sounds convincing for atrial fibrillation with RVR and her symptoms correlate with this.  She is appropriately started on apixaban 5 mg twice daily.  Her CHA2DS2-VASc score equals 2 for age and gender.  We had a discussion today about the rationale for chronic oral anticoagulation for prevention of thromboembolism in the setting of paroxysmal atrial fibrillation.  The patient has no history of gastrointestinal bleeding or other bleeding problems.  She understands the importance of avoiding nonsteroidal anti-inflammatories and she will use acetaminophen for pain as needed.  I have recommended an echocardiogram to evaluate LV function, presence of any valvular heart disease, and atrial  size.  I have recommended a 2-week ZIO monitor to evaluate atrial fibrillation burden and potential need for medicine titration or initiation of an antiarrhythmic drug.  At this point it seems reasonable to continue on her current course which includes metoprolol succinate and apixaban.  We discussed the natural history of atrial fibrillation, consideration of things such as antiarrhythmic drug therapy or radiofrequency ablation if her symptoms worsen or atrial fibrillation becomes more frequent.   Medication Adjustments/Labs and Tests Ordered: Current medicines are reviewed at length with the patient today.  Concerns regarding medicines are outlined above.  Orders Placed This Encounter  Procedures  . LONG TERM MONITOR (3-14 DAYS)  . EKG 12-Lead  . ECHOCARDIOGRAM COMPLETE   No orders of the defined types were placed in this encounter.   Patient Instructions  Medication Instructions:  Your provider recommends that you continue on your current medications as directed. Please refer to the Current Medication list given to you today.   *If you need a refill on your cardiac medications before your next appointment, please call your pharmacy*  Testing/Procedures: Dr. Burt Knack recommends you wear a ZIO PATCH (heart monitor) for 2 weeks. This will be mailed to your home.  Your physician has requested that you have an echocardiogram. Echocardiography is a painless test that uses sound waves to create images of your heart. It provides your doctor with information about the size and shape of your heart and how well your heart's chambers and valves are working. This procedure takes approximately one hour. There are no restrictions for this procedure.  Follow-Up: Your provider recommends that you schedule a follow-up appointment in 3 months with Richardson Dopp.   ZIO XT- Long Term Monitor Instructions   Your physician has requested you wear your ZIO patch monitor 14 days.   This is a single patch  monitor.  [employer] supplies one patch monitor per enrollment.  Additional stickers are not available.   Please do not apply patch if you will be having a Nuclear Stress Test, Echocardiogram, Cardiac CT, MRI, or Chest Xray during the time frame you would be wearing the monitor. The patch cannot be  worn during these tests.  You cannot remove and re-apply the ZIO XT patch monitor.   Your ZIO patch monitor will be sent USPS Priority mail from [facility] directly to your home address. The monitor may also be mailed to a PO BOX if home delivery is not available.   It may take 3-5 days to receive your monitor after you have been enrolled.   Once you have received you monitor, please review enclosed instructions.  Your monitor has already been registered assigning a specific monitor serial # to you.   Applying the monitor   Shave hair from upper left chest.   Hold abrader disc by orange tab.  Rub abrader in 40 strokes over left upper chest as indicated in your monitor instructions.   Clean area with 4 enclosed alcohol pads .  Use all pads to assure are is cleaned thoroughly.  Let dry.   Apply patch as indicated in monitor instructions.  Patch will be place under collarbone on left side of chest with arrow pointing upward.   Rub patch adhesive wings for 2 minutes.Remove white label marked "1".  Remove white label marked "2".  Rub patch adhesive wings for 2 additional minutes.   While looking in a mirror, press and release button in center of patch.  A small green light will flash 3-4 times .  This will be your only indicator the monitor has been turned on.     Do not shower for the first 24 hours.  You may shower after the first 24 hours.   Press button if you feel a symptom. You will hear a small click.  Record Date, Time and Symptom in the Patient Log Book.   When you are ready to remove patch, follow instructions on last 2 pages of Patient Log Book.  Stick patch monitor onto last page of  Patient Log Book.   Place Patient Log Book in [place] box.  Use locking tab on box and tape box closed securely.  The [employer] and [employer] has [employer] on it.  Please place in mailbox as soon as possible.  Your physician should have your test results approximately 7 days after the monitor has been mailed back to [facility].   Call [facility] at [phone] if you have questions regarding your ZIO XT patch monitor.  Call them immediately if you see an orange light blinking on your monitor.   If your monitor falls off in less than 4 days contact our Monitor department at [phone].  If your monitor becomes loose or falls off after 4 days call [employer] at [phone] for suggestions on securing your monitor.      Signed, Sherren Mocha, MD  01/13/2020 6:34 PM    [place]

## 2020-01-13 NOTE — Patient Instructions (Addendum)
Medication Instructions:  Your provider recommends that you continue on your current medications as directed. Please refer to the Current Medication list given to you today.   *If you need a refill on your cardiac medications before your next appointment, please call your pharmacy*  Testing/Procedures: Dr. Burt Knack recommends you wear a ZIO PATCH (heart monitor) for 2 weeks. This will be mailed to your home.  Your physician has requested that you have an echocardiogram. Echocardiography is a painless test that uses sound waves to create images of your heart. It provides your doctor with information about the size and shape of your heart and how well your heart's chambers and valves are working. This procedure takes approximately one hour. There are no restrictions for this procedure.  Follow-Up: Your provider recommends that you schedule a follow-up appointment in 3 months with Crystal [name].   ZIO XT- Long Term Monitor Instructions   Your physician has requested you wear your ZIO patch monitor 14 days.   This is a single patch monitor.  Irhythm supplies one patch monitor per enrollment.  Additional stickers are not available.   Please do not apply patch if you will be having a Nuclear Stress Test, Echocardiogram, Cardiac CT, MRI, or Chest Xray during the time frame you would be wearing the monitor. The patch cannot be worn during these tests.  You cannot remove and re-apply the ZIO XT patch monitor.   Your ZIO patch monitor will be sent USPS Priority mail from [facility] directly to your home address. The monitor may also be mailed to a PO BOX if home delivery is not available.   It may take 3-5 days to receive your monitor after you have been enrolled.   Once you have received you monitor, please review enclosed instructions.  Your monitor has already been registered assigning a specific monitor serial # to you.   Applying the monitor   Shave hair from upper left chest.   Hold  abrader disc by orange tab.  Rub abrader in 40 strokes over left upper chest as indicated in your monitor instructions.   Clean area with 4 enclosed alcohol pads .  Use all pads to assure are is cleaned thoroughly.  Let dry.   Apply patch as indicated in monitor instructions.  Patch will be place under collarbone on left side of chest with arrow pointing upward.   Rub patch adhesive wings for 2 minutes.Remove white label marked "1".  Remove white label marked "2".  Rub patch adhesive wings for 2 additional minutes.   While looking in a mirror, press and release button in center of patch.  A small green light will flash 3-4 times .  This will be your only indicator the monitor has been turned on.     Do not shower for the first 24 hours.  You may shower after the first 24 hours.   Press button if you feel a symptom. You will hear a small click.  Record Date, Time and Symptom in the Patient Log Book.   When you are ready to remove patch, follow instructions on last 2 pages of Patient Log Book.  Stick patch monitor onto last page of Patient Log Book.   Place Patient Log Book in [place] box.  Use locking tab on box and tape box closed securely.  The [employer] has [employer] on it.  Please place in mailbox as soon as possible.  Your physician should have your test results approximately 7  days after the monitor has been mailed back to [place].   Call [place] at [phone] if you have questions regarding your ZIO XT patch monitor.  Call them immediately if you see an orange light blinking on your monitor.   If your monitor falls off in less than 4 days contact our Monitor department at [phone].  If your monitor becomes loose or falls off after 4 days call [employer] at [phone] for suggestions on securing your monitor.

## 2020-01-19 ENCOUNTER — Other Ambulatory Visit (INDEPENDENT_AMBULATORY_CARE_PROVIDER_SITE_OTHER): Payer: Medicare Other

## 2020-01-19 DIAGNOSIS — I480 Paroxysmal atrial fibrillation: Secondary | ICD-10-CM | POA: Diagnosis not present

## 2020-01-31 ENCOUNTER — Ambulatory Visit (HOSPITAL_COMMUNITY): Payer: Medicare Other | Attending: Cardiology

## 2020-01-31 ENCOUNTER — Other Ambulatory Visit: Payer: Self-pay

## 2020-01-31 DIAGNOSIS — I480 Paroxysmal atrial fibrillation: Secondary | ICD-10-CM | POA: Diagnosis present

## 2020-01-31 LAB — ECHOCARDIOGRAM COMPLETE
Area-P 1/2: 2.45 cm2
S' Lateral: 2.8 cm

## 2020-02-05 NOTE — Progress Notes (Signed)
Patient Care Team: Hoyt Koch, MD as PCP - General (Internal Medicine) Sherren Mocha, MD as PCP - Cardiology (Cardiology) Sylvan Cheese, NP as Nurse Practitioner (Nurse Practitioner) Princess Bruins, MD as Consulting Physician (Obstetrics and Gynecology)  DIAGNOSIS:    ICD-10-CM   1. Malignant neoplasm of lower-outer quadrant of right breast of female, estrogen receptor positive ([place])  C50.511    Z17.0     SUMMARY OF ONCOLOGIC HISTORY: Oncology History  Breast cancer of lower-outer quadrant of right female breast ([place])  09/04/2014 Mammogram   Right breast with 1 cm new cluster of calcifications at 7:00, 5 cm from nipple; 0.5 cm oval aysmmetry in left medial breast, 3.5 from nipple.     09/08/2014 Breast US   Right breast: possible 5 mm oval cyst, hypoechoic, correlates with mammographic findings. no axillary findings.   09/20/2014 Initial Biopsy   Right breast needle core bx: DCIS with associated calcifications, ER+ (97%), PR+ (67%)   09/20/2014 Clinical Stage   Stage 0: Tis N0   10/18/2014 Definitive Surgery   Right breast lumpectomy Ninfa [facility]): DCIS, grade 1, calcifications present. ER+ (97%), PR+ (67%). 2.6 cm in size. 0.52mm from nearest margin (posterior). [facility] nomogram risk of recurrence 2% at 10 years with antiestrogen therapy and radiation   10/18/2014 Pathologic Stage   pTis, pNx: Stage 0   11/23/2014 - 12/25/2014 Radiation Therapy   Adjuvant RT completed ([place]): right breast 42.72 Gy over 16 fractions; boost to lumpectomy cavity 12 Gy; total dose received: 54.72 Gy.     12/30/2014 -  Anti-estrogen oral therapy   Tamoxifen 20 mg daily stopped 02/27/2015 due to uterine hypertrophy and bleeding, D&C 05/30/2015, anastrozole began 06/14/2015   03/09/2015 Survivorship   A copy of the survivorship care plan was mailed to the patient in lieu of an in-person visit at her request.     CHIEF COMPLIANT: Follow-up of right breast cancer on anastrozole  therapy  INTERVAL HISTORY: Crystal [name] is a 69 y.o. with above-mentioned history of right breast cancer treated with lumpectomy, radiation, and who is currently on anti-estrogen therapy with anastrozole. She presents to the clinic today for annual follow-up.    ALLERGIES:  has No Known Allergies.  MEDICATIONS:  Current Outpatient Medications  Medication Sig Dispense Refill  . acetaminophen (TYLENOL) 500 MG tablet Take 500 mg by mouth as needed.    Marland Kitchen alendronate (FOSAMAX) 70 MG tablet TAKE 1 TABLET EACH WEEK WITH 6-8 OZ OF WATER, NO FOOD OR MEDS FOR 30 MIN, REMAIN UPRIGHT. 12 tablet 0  . anastrozole (ARIMIDEX) 1 MG tablet Take 1 tablet (1 mg total) by mouth daily. 90 tablet 3  . apixaban (ELIQUIS) 5 MG TABS tablet Take 1 tablet (5 mg total) by mouth 2 (two) times daily. 60 tablet 3  . Calcium Carbonate-Vitamin D (CALCIUM 600-D) 600-400 MG-UNIT per tablet Take 1 tablet by mouth daily.     . Cholecalciferol (VITAMIN D3) 1000 UNITS CAPS Take by mouth daily.      . metoprolol succinate (TOPROL-XL) 25 MG 24 hr tablet Take 1 tablet (25 mg total) by mouth daily. 90 tablet 3  . Multiple Vitamin (MULTIVITAMIN) tablet Take 1 tablet by mouth daily.      Vladimir Faster Glycol-Propyl Glycol (SYSTANE OP) Apply 1 drop to eye as needed (For dryness.).    Marland Kitchen Probiotic Product (FORTIFY DAILY PROBIOTIC PO) Take 550 mg by mouth daily.    Marland Kitchen trimethoprim (TRIMPEX) 100 MG tablet      No current  facility-administered medications for this visit.    PHYSICAL EXAMINATION: ECOG PERFORMANCE STATUS: 1 - Symptomatic but completely ambulatory  Vitals:   02/06/20 1038  BP: 122/80  Pulse: 63  Resp: 18  Temp: 97.7 F (36.5 C)  SpO2: 97%   Filed Weights   02/06/20 1038  Weight: 155 lb 14.4 oz (70.7 kg)    BREAST: No palpable masses or nodules in either right or left breasts. No palpable axillary supraclavicular or infraclavicular adenopathy no breast tenderness or nipple discharge. (exam performed in the  presence of a chaperone)  LABORATORY DATA:  I have reviewed the data as listed CMP Latest Ref Rng & Units 01/05/2020 05/09/2019 04/07/2018  Glucose 65 - 99 mg/dL 120(H) 84 104(H)  BUN 7 - 25 mg/dL 19 19 17   Creatinine 0.50 - 0.99 mg/dL 1.11(H) 0.90 0.85  Sodium 135 - 146 mmol/L 139 139 139  Potassium 3.5 - 5.3 mmol/L 4.1 4.2 4.4  Chloride 98 - 110 mmol/L 101 104 102  CO2 20 - 32 mmol/L 32 28 32  Calcium 8.6 - 10.4 mg/dL 10.4 9.8 10.1  Total Protein 6.1 - 8.1 g/dL 7.3 7.3 7.6  Total Bilirubin 0.2 - 1.2 mg/dL 0.4 0.4 0.4  Alkaline Phos 39 - 117 U/L - 57 50  AST 10 - 35 U/L 19 24 19   ALT 6 - 29 U/L 15 17 16     Lab Results  Component Value Date   WBC 7.0 01/05/2020   HGB 13.0 01/05/2020   HCT 39.5 01/05/2020   MCV 91.6 01/05/2020   PLT 271 01/05/2020   NEUTROABS 3.3 02/02/2017    ASSESSMENT & PLAN:  Breast cancer of lower-outer quadrant of right female breast ([place]) Rt Breast DCIS grade 1, 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 Tolerating anastrozole extremely well. She completes 5 years of therapy by December of this year.  She will discontinue it at that time.  Severe osteopenia: T score -1.9 (improved from -2.4) based on bone mineral density done December 2020 Currently on Fosamax. In combination with calcium and vitamin D. Once she finishes the current prescription for Fosamax she will stop it.  (Since she will also stop anastrozole at the same time) If her primary care physician wishes to continue with the Fosamax and they may order it at a later time by monitoring her bone density test.  I usually recommended if the bone density shows a T score that is greater than -2.  Breast cancer  surveillance:  1. Mammograms  6/24/2021at [facility]: normal, breast density category D. 2. Breast Exam: 02/06/2020: benign  We discussed with her that since she has her primary care and gynecologist monitoring her breasts we do not have to continue to see her. She will be seen on an as-needed basis.    No orders of the defined types were placed in this encounter.  The patient has a good understanding of the overall plan. she agrees with it. she will call with any problems that may develop before the next visit here.  Total time spent: 20 mins including face to face time and time spent for planning, charting and coordination of care  Nicholas Lose, MD 02/06/2020  I, Cloyde Reams Dorshimer, am acting as scribe for Dr. Nicholas Lose.  I  have reviewed the above documentation for accuracy and completeness, and I agree with the above.

## 2020-02-06 ENCOUNTER — Inpatient Hospital Stay: Payer: Medicare Other | Attending: Hematology and Oncology | Admitting: Hematology and Oncology

## 2020-02-06 ENCOUNTER — Other Ambulatory Visit: Payer: Self-pay

## 2020-02-06 DIAGNOSIS — M8580 Other specified disorders of bone density and structure, unspecified site: Secondary | ICD-10-CM | POA: Diagnosis not present

## 2020-02-06 DIAGNOSIS — C50511 Malignant neoplasm of lower-outer quadrant of right female breast: Secondary | ICD-10-CM

## 2020-02-06 DIAGNOSIS — Z170 Estrogen receptor positive status [ER+]: Secondary | ICD-10-CM | POA: Insufficient documentation

## 2020-02-06 DIAGNOSIS — Z79811 Long term (current) use of aromatase inhibitors: Secondary | ICD-10-CM | POA: Diagnosis not present

## 2020-02-06 DIAGNOSIS — Z7901 Long term (current) use of anticoagulants: Secondary | ICD-10-CM | POA: Diagnosis not present

## 2020-02-06 DIAGNOSIS — Z79899 Other long term (current) drug therapy: Secondary | ICD-10-CM | POA: Insufficient documentation

## 2020-02-06 NOTE — Assessment & Plan Note (Signed)
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 Tolerating anastrozole extremely well.  Severe osteopenia: T score -2.1 (improved from -2.4) based on bone mineral density done 06/01/2017 Currently on Fosamax. In combination with calcium and vitamin D. I renewed her prescription for Fosamax as well today. I ordered a bone density to be done December 2020.  Breast cancer surveillance:  1. Mammograms  6/24/2021at [facility]: normal, breast density category D. 2. Breast Exam: 02/06/2020: benign  Return to clinic in 1 yearfor follow-up

## 2020-02-13 ENCOUNTER — Encounter: Payer: Self-pay | Admitting: Neurology

## 2020-02-13 ENCOUNTER — Other Ambulatory Visit: Payer: Self-pay

## 2020-02-13 ENCOUNTER — Ambulatory Visit (INDEPENDENT_AMBULATORY_CARE_PROVIDER_SITE_OTHER): Payer: Medicare Other | Admitting: Neurology

## 2020-02-13 VITALS — BP 110/65 | HR 78 | Resp 18 | Ht 70.0 in | Wt 158.0 lb

## 2020-02-13 DIAGNOSIS — G629 Polyneuropathy, unspecified: Secondary | ICD-10-CM

## 2020-02-13 DIAGNOSIS — R252 Cramp and spasm: Secondary | ICD-10-CM | POA: Diagnosis not present

## 2020-02-13 NOTE — Progress Notes (Signed)
Follow-up Visit   Date: 02/13/20   Crystal [name] MRN: [id number] DOB: 1951-01-12   Interim History: Crystal [name] is a 69 y.o. right-handed Caucasian female with right breast cancer s/p radiation (2016)  returning to the clinic for follow-up of bilateral toe numbness/tingling.  The patient was accompanied to the clinic by husband.  History of present illness: Starting around 2011, she started having tingling of the tips of her toes.  She saw Dr. Linda Hedges and was told she may have early signs of neuropathy.  During the summer of 2018, she starting having burning sensation over the toes and the pad of the feet.   She recalls having to take her shoes off her feet because of severe pain.  Lately, she does not have severe pain, but continues to have tingling and numbness of the toes, which is worse on the left.  She denies any imbalance, falls, or weakness.   She denies any history of diabetes mellitus, alcohol abuse, or family history neuropathy.   UPDATE 09/01/2018:  She had normal EDX of the legs performed in January. She continues to have tingling over over the balls of the feet and tips of the toes, which is worse in the evening.  Symptoms are not painful, but moreso annoying.  She does not have weakness or imbalance, but is aware that certain poses in yoga are more difficult.  She continues to work and is otherwise very healthy.   UPDATE 02/13/2020:  She is here and reports having a one week spell of worsening burning pain over the balls of the feet and toes, worse than the left foot. Symptoms were intense for a few days and improved without intervention.  Now, she is back to the usual level of discomfort and numbness in the feet.  She also complaints of muscle cramps in the lower legs.  No weakness, illness, or falls. She walks unassisted.   Medications:  Current Outpatient Medications on File Prior to Visit  Medication Sig Dispense Refill  . alendronate (FOSAMAX) 70 MG tablet  TAKE 1 TABLET EACH WEEK WITH 6-8 OZ OF WATER, NO FOOD OR MEDS FOR 30 MIN, REMAIN UPRIGHT. 12 tablet 0  . anastrozole (ARIMIDEX) 1 MG tablet Take 1 tablet (1 mg total) by mouth daily. 90 tablet 3  . apixaban (ELIQUIS) 5 MG TABS tablet Take 1 tablet (5 mg total) by mouth 2 (two) times daily. 60 tablet 3  . Calcium Carbonate-Vitamin D (CALCIUM 600-D) 600-400 MG-UNIT per tablet Take 1 tablet by mouth daily.     . Cholecalciferol (VITAMIN D3) 1000 UNITS CAPS Take by mouth daily.      . metoprolol succinate (TOPROL-XL) 25 MG 24 hr tablet Take 1 tablet (25 mg total) by mouth daily. 90 tablet 3  . Multiple Vitamin (MULTIVITAMIN) tablet Take 1 tablet by mouth daily.      Vladimir Faster Glycol-Propyl Glycol (SYSTANE OP) Apply 1 drop to eye as needed (For dryness.).    Marland Kitchen Probiotic Product (FORTIFY DAILY PROBIOTIC PO) Take 550 mg by mouth daily.    Marland Kitchen trimethoprim (TRIMPEX) 100 MG tablet      No current facility-administered medications on file prior to visit.    Allergies: No Known Allergies  Vital Signs:  BP 110/65   Pulse 78   Resp 18   Ht _0  (1.778 m)   Wt 158 lb (71.7 kg)   SpO2 97%   BMI 22.67 kg/m   Neurological Exam: MENTAL STATUS including orientation to  time, place, person, recent and remote memory, attention span and concentration, language, and fund of knowledge is normal.  Speech is not dysarthric.  MOTOR:  Motor strength is 5/5 in all extremities, including distally in the feet.  No pronator drift.  Tone is normal.    MSRs:  Reflexes are 2+/4 throughout .  SENSORY:  Intact to vibration throughout. Rhomberg testing negative.  COORDINATION/GAIT:  Normal finger-to- nose-finger.  Gait narrow based and stable. Tandem gait intact.  Data: NCS/EMG of the legs 07/20/2018:  This is a normal study of the lower extremities.  In particular, there is no evidence of a sensorimotor polyneuropathy or lumbosacral radiculopathy.    Labs10/10/2016:  ESR 41, vitamin B12 597, vitamin B1 20, SPEP  with IFE no M protein, glucose tolerance testing normal  IMPRESSION/PLAN: 1. 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  Although exam and EDX is normal, history is highly suggestive of neuropathy.  Overall, there may be mild progression of neuropathy.  Acute episode of pain has subsided and now she appears back to her baseline level of discomfort.  I have suggested that she can try OTC lidocaine ointment to feet as needed  2. Muscle cramps Start posterior leg stretches  Stay well hydrated Consider muscle relaxants as needed  Return to clinic in 1 year.  Total time spent reviewing records, interview, history/exam, documentation, counseling and coordination of care on day of encounter:  20 min     Thank you for allowing me to participate in patient's care.  If I can answer any additional questions, I would be pleased to do so.    Sincerely,    Kenyon Eshleman K. Posey Pronto, DO

## 2020-02-13 NOTE — Patient Instructions (Addendum)
You can try over the counter lidocaine cream to feet as needed  Cheek your feet daily  Start leg stretches at night time and stay well-hydrated  Return to clinic in 1 year

## 2020-03-19 ENCOUNTER — Ambulatory Visit: Payer: BC Managed Care – PPO | Admitting: Neurology

## 2020-04-17 ENCOUNTER — Ambulatory Visit (INDEPENDENT_AMBULATORY_CARE_PROVIDER_SITE_OTHER): Payer: Medicare Other | Admitting: Internal Medicine

## 2020-04-17 ENCOUNTER — Encounter: Payer: Self-pay | Admitting: Internal Medicine

## 2020-04-17 VITALS — Temp 97.7°F

## 2020-04-17 DIAGNOSIS — Z20822 Contact with and (suspected) exposure to covid-19: Secondary | ICD-10-CM

## 2020-04-17 NOTE — Progress Notes (Signed)
   Subjective:    Patient ID: Crystal [name], female    DOB: July 19, 1950, 69 y.o.   MRN: [id number]  HPI 69 year old Female with Covid-19 exposure on Friday . She was in a meeting with a man who subsequently tested positive for Covid-19. Patient is currently asymptomatic.Has received 2 Moderna vaccinations for Covid-19 in Feb and March of this year. She took a home test that was negative.    Review of Systems no symptoms of fever, respiratory congestion, headache or fever.     Objective:   Physical Exam Patient is afebrile. Covid-19 PCR test obtained by Nasal swab and sent to [facility].       Assessment & Plan:  Covid-19 exposure- Covid PCR test obtained and pending. Will notify Crystal [name] of result as soon as we have it back.

## 2020-04-17 NOTE — Patient Instructions (Signed)
PCR Covid-19 test obtained.

## 2020-04-18 LAB — SARS-COV-2 RNA,(COVID-19) QUALITATIVE NAAT: SARS CoV2 RNA: NOT DETECTED

## 2020-04-19 NOTE — Progress Notes (Signed)
Cardiology Office Note:    Date:  04/20/2020   ID:  BROOKELLE PELLICANE, DOB [birth year], MRN [id number]  PCP:  Hoyt Koch, MD  [facility] Cardiologist:  Sherren Mocha, MD   [facility] Electrophysiologist:  None   Referring MD: Hoyt Koch, *   Chief Complaint:  Follow-up (Atrial fibrillation)    Patient Profile:    MYSTIC LABO is a 69 y.o. female with:   Paroxysmal atrial fibrillation   R Breast CA, s/p lumpectomy/XRT   Prior CV studies: Zio Monitor 8/21 NSR, avg HR 71; AF w RVR (5% burden - Avg HR 133)  Echocardiogram 01/31/20 EF 60-65, no RWMA, normal RVSF, trivial MR, trivial AI  History of Present Illness:    Ms. Carey was last seen by Dr. Burt Knack in 7/21 for evaluation of paroxysmal atrial fibrillation.  She had been placed on metoprolol and Apixaban by her PCP.  Her symptoms of palpitations were improved on metoprolol. an echocardiogram demonstrated normal LVF and no valvular heart disease.  A follow up Zio monitor demonstrated mainly normal sinus rhythm with 5% burden of paroxysmal atrial fibrillation with rapid rate.  She returns for follow up.  She is here with her husband, Elta Guadeloupe, who is followed by Dr. Harrell Gave and underwent PVI ablation with Dr. Curt Bears.  Mrs. Slifer continues to have symptoms of atrial fibrillation.  She has palpitations several times a week.  Often, she has daily symptoms.  She had one episode that was prolonged for several hours.  She has noted weakness at times.  She has not had chest discomfort, shortness of breath, syncope, leg swelling or orthopnea.     Past Medical History:  Diagnosis Date  . BACK PAIN, LUMBAR   . Breast cancer, right breast   . FIBROADENOMA, BREAST 2006  . PARESTHESIA    toes  . PONV (postoperative nausea and vomiting)   . Radiation 11/23/14-12/25/14   right breast 42.72 gray, lumpectomy cavity boosted to 54.72 gray  . Wears glasses     Current Medications: Current Meds    Medication Sig  . apixaban (ELIQUIS) 5 MG TABS tablet Take 1 tablet (5 mg total) by mouth 2 (two) times daily.  . Calcium Carbonate-Vitamin D (CALCIUM 600-D) 600-400 MG-UNIT per tablet Take 1 tablet by mouth daily.   . Cholecalciferol (VITAMIN D3) 1000 UNITS CAPS Take by mouth daily.    . metoprolol succinate (TOPROL-XL) 25 MG 24 hr tablet Take 1 tablet (25 mg total) by mouth daily.  . Multiple Vitamin (MULTIVITAMIN) tablet Take 1 tablet by mouth daily.    Vladimir Faster Glycol-Propyl Glycol (SYSTANE OP) Apply 1 drop to eye as needed (For dryness.).  Marland Kitchen Probiotic Product (FORTIFY DAILY PROBIOTIC PO) Take 550 mg by mouth daily.  Marland Kitchen trimethoprim (TRIMPEX) 100 MG tablet      Allergies:   Patient has no known allergies.   Social History   Tobacco Use  . Smoking status: Never Smoker  . Smokeless tobacco: Never Used  Vaping Use  . Vaping Use: Never used  Substance Use Topics  . Alcohol use: Yes    Alcohol/week: 5.0 standard drinks    Types: 5 Standard drinks or equivalent per week    Comment: 2 beers/week  . Drug use: No     Family Hx: The patient's family history includes Arrhythmia in her mother; Cancer in her mother; Coronary artery disease in her father and maternal grandfather; Diabetes in her father; GER disease in her mother; Hyperlipidemia in her father;  Hypertension in her father and mother; Hypothyroidism in her mother; Osteoporosis in her mother. There is no history of COPD, Asthma, Colon cancer, Esophageal cancer, Rectal cancer, or Stomach cancer.  Review of Systems  Gastrointestinal: Negative for hematochezia and melena.  Genitourinary: Negative for hematuria.     EKGs/Labs/Other Test Reviewed:    EKG:  EKG is not ordered today.  The ekg ordered today demonstrates n/a  Recent Labs: 01/05/2020: ALT 15; BUN 19; Creat 1.11; Hemoglobin 13.0; Platelets 271; Potassium 4.1; Sodium 139; TSH 0.08   Recent Lipid Panel Lab Results  Component Value Date/Time   CHOL 199 05/09/2019  10:05 AM   TRIG 123.0 05/09/2019 10:05 AM   HDL 77.50 05/09/2019 10:05 AM   CHOLHDL 3 05/09/2019 10:05 AM   LDLCALC 97 05/09/2019 10:05 AM    Risk Assessment/Calculations:     CHA2DS2-VASc Score = 2  This indicates a 2.2% annual risk of stroke. The patient's score is based upon: CHF History: 0 HTN History: 0 Diabetes History: 0 Stroke History: 0 Vascular Disease History: 0 Age Score: 1 Gender Score: 1      Physical Exam:    VS:  BP 100/72   Pulse 86   Ht 5\' 10"  (1.778 m)   Wt 152 lb (68.9 kg)   SpO2 97%   BMI 21.81 kg/m     Wt Readings from Last 3 Encounters:  04/20/20 152 lb (68.9 kg)  02/13/20 158 lb (71.7 kg)  02/06/20 155 lb 14.4 oz (70.7 kg)     Constitutional:      Appearance: Healthy appearance. Not in distress.  Neck:     Thyroid: No thyromegaly.     Vascular: JVD normal.  Pulmonary:     Effort: Pulmonary effort is normal.     Breath sounds: No wheezing. No rales.  Cardiovascular:     Normal rate. Regular rhythm. Normal S1. Normal S2.     Murmurs: There is no murmur.  Edema:    Peripheral edema absent.  Abdominal:     Palpations: Abdomen is soft.  Skin:    General: Skin is warm and dry.  Neurological:     Mental Status: Alert and oriented to person, place and time.     Cranial Nerves: Cranial nerves are intact.       ASSESSMENT & PLAN:    1. Paroxysmal atrial fibrillation (HCC) CHA2DS2-VASc Score = 2 [CHF History: 0, HTN History: 0, Diabetes History: 0, Stroke History: 0, Vascular Disease History: 0, Age Score: 1, Gender Score: 1].  Therefore, the patient's annual risk of stroke is 2.2 %.   She is felt to be somewhat high risk and is maintained on anticoagulation with Apixaban.  She is tolerating anticoagulation well.  She remains on rate control with metoprolol succinate.  Her monitor did demonstrate 5% burden of atrial fibrillation with heart rates up to the 130s.  Her echocardiogram demonstrated normal LV function and normal bilateral atria.   She is having frequent symptoms of atrial fibrillation.  Her blood pressure will not tolerate further increases in her beta-blocker.  She would likely be a good candidate for PVI ablation.  Since her husband has seen Dr. Curt Bears in the past, I will refer her to him.  Obtain follow-up BMET, CBC today.    Dispo:  Return in about 6 months (around 10/19/2020) for Routine Follow Up, w/ Dr. Burt Knack, in person.   Medication Adjustments/Labs and Tests Ordered: Current medicines are reviewed at length with the patient today.  Concerns regarding  medicines are outlined above.  Tests Ordered: Orders Placed This Encounter  Procedures  . Basic metabolic panel  . CBC  . Ambulatory referral to Cardiac Electrophysiology   Medication Changes: No orders of the defined types were placed in this encounter.   Signed, Richardson Dopp, PA-C  04/20/2020 8:35 AM    [facility] [place], [place], [place]  [address] Phone: [phone]; Fax: [phone]

## 2020-04-20 ENCOUNTER — Ambulatory Visit (INDEPENDENT_AMBULATORY_CARE_PROVIDER_SITE_OTHER): Payer: Medicare Other | Admitting: Physician Assistant

## 2020-04-20 ENCOUNTER — Encounter: Payer: Self-pay | Admitting: Physician Assistant

## 2020-04-20 ENCOUNTER — Other Ambulatory Visit: Payer: Self-pay

## 2020-04-20 VITALS — BP 100/72 | HR 86 | Ht 70.0 in | Wt 152.0 lb

## 2020-04-20 DIAGNOSIS — Z79899 Other long term (current) drug therapy: Secondary | ICD-10-CM

## 2020-04-20 DIAGNOSIS — I480 Paroxysmal atrial fibrillation: Secondary | ICD-10-CM

## 2020-04-20 LAB — CBC
Hematocrit: 37.6 % (ref 34.0–46.6)
Hemoglobin: 12.4 g/dL (ref 11.1–15.9)
MCH: 30.8 pg (ref 26.6–33.0)
MCHC: 33 g/dL (ref 31.5–35.7)
MCV: 93 fL (ref 79–97)
Platelets: 253 10*3/uL (ref 150–450)
RBC: 4.03 x10E6/uL (ref 3.77–5.28)
RDW: 11.9 % (ref 11.7–15.4)
WBC: 4.8 10*3/uL (ref 3.4–10.8)

## 2020-04-20 LAB — BASIC METABOLIC PANEL
BUN/Creatinine Ratio: 19 (ref 12–28)
BUN: 17 mg/dL (ref 8–27)
CO2: 27 mmol/L (ref 20–29)
Calcium: 9.8 mg/dL (ref 8.7–10.3)
Chloride: 101 mmol/L (ref 96–106)
Creatinine, Ser: 0.91 mg/dL (ref 0.57–1.00)
GFR calc Af Amer: 75 mL/min/{1.73_m2} (ref >59)
GFR calc non Af Amer: 65 mL/min/{1.73_m2} (ref >59)
Glucose: 84 mg/dL (ref 65–99)
Potassium: 5 mmol/L (ref 3.5–5.2)
Sodium: 138 mmol/L (ref 134–144)

## 2020-04-20 NOTE — Patient Instructions (Signed)
Medication Instructions:  Your physician recommends that you continue on your current medications as directed. Please refer to the Current Medication list given to you today.  *If you need a refill on your cardiac medications before your next appointment, please call your pharmacy*  Lab Work: You will have labs drawn today: BMET/CBC  Testing/Procedures: None ordered today  Follow-Up: At [facility], you and your health needs are our priority.  As part of our continuing mission to provide you with exceptional heart care, we have created designated Provider Care Teams.  These Care Teams include your primary Cardiologist (physician) and Advanced Practice Providers (APPs -  Physician Assistants and Nurse Practitioners) who all work together to provide you with the care you need, when you need it.  Your next appointment:   6 month(s)  The format for your next appointment:   In Person  Provider:   Sherren Mocha, MD

## 2020-05-01 ENCOUNTER — Encounter: Payer: Self-pay | Admitting: Cardiology

## 2020-05-01 ENCOUNTER — Ambulatory Visit (INDEPENDENT_AMBULATORY_CARE_PROVIDER_SITE_OTHER): Payer: Medicare Other | Admitting: Cardiology

## 2020-05-01 ENCOUNTER — Other Ambulatory Visit: Payer: Self-pay

## 2020-05-01 VITALS — BP 110/68 | HR 72 | Ht 70.0 in | Wt 151.0 lb

## 2020-05-01 DIAGNOSIS — I480 Paroxysmal atrial fibrillation: Secondary | ICD-10-CM

## 2020-05-01 DIAGNOSIS — Z01810 Encounter for preprocedural cardiovascular examination: Secondary | ICD-10-CM

## 2020-05-01 DIAGNOSIS — Z01812 Encounter for preprocedural laboratory examination: Secondary | ICD-10-CM

## 2020-05-01 LAB — CBC
Hematocrit: 37.4 % (ref 34.0–46.6)
Hemoglobin: 12.2 g/dL (ref 11.1–15.9)
MCH: 30.5 pg (ref 26.6–33.0)
MCHC: 32.6 g/dL (ref 31.5–35.7)
MCV: 94 fL (ref 79–97)
Platelets: 259 10*3/uL (ref 150–450)
RBC: 4 x10E6/uL (ref 3.77–5.28)
RDW: 11.8 % (ref 11.7–15.4)
WBC: 6.2 10*3/uL (ref 3.4–10.8)

## 2020-05-01 LAB — BASIC METABOLIC PANEL
BUN/Creatinine Ratio: 15 (ref 12–28)
BUN: 15 mg/dL (ref 8–27)
CO2: 27 mmol/L (ref 20–29)
Calcium: 10 mg/dL (ref 8.7–10.3)
Chloride: 102 mmol/L (ref 96–106)
Creatinine, Ser: 1 mg/dL (ref 0.57–1.00)
GFR calc Af Amer: 67 mL/min/{1.73_m2} (ref >59)
GFR calc non Af Amer: 58 mL/min/{1.73_m2} — ABNORMAL LOW (ref >59)
Glucose: 92 mg/dL (ref 65–99)
Potassium: 5.2 mmol/L (ref 3.5–5.2)
Sodium: 140 mmol/L (ref 134–144)

## 2020-05-01 NOTE — Patient Instructions (Signed)
Medication Instructions:  Your physician recommends that you continue on your current medications as directed. Please refer to the Current Medication list given to you today.  *If you need a refill on your cardiac medications before your next appointment, please call your pharmacy*   Lab Work: Pre procedure labs today: BMET & CBC If you have labs (blood work) drawn today and your tests are completely normal, you will receive your results only by: Marland Kitchen MyChart Message (if you have MyChart) OR . A paper copy in the mail If you have any lab test that is abnormal or we need to change your treatment, we will call you to review the results.   Testing/Procedures: Your physician has requested that you have cardiac CT within 7 days PRIOR to your ablation. Cardiac computed tomography (CT) is a painless test that uses an x-ray machine to take clear, detailed pictures of your heart.  Please follow instructions below located under "other instructions".  Your physician has recommended that you have an ablation. Catheter ablation is a medical procedure used to treat some cardiac arrhythmias (irregular heartbeats). During catheter ablation, a long, thin, flexible tube is put into a blood vessel in your groin (upper thigh), or neck. This tube is called an ablation catheter. It is then guided to your heart through the blood vessel. Radio frequency waves destroy small areas of heart tissue where abnormal heartbeats may cause an arrhythmia to start.  Please follow instructions below located under "other instructions".   Follow-Up: At [facility], you and your health needs are our priority.  As part of our continuing mission to provide you with exceptional heart care, we have created designated Provider Care Teams.  These Care Teams include your primary Cardiologist (physician) and Advanced Practice Providers (APPs -  Physician Assistants and Nurse Practitioners) who all work together to provide you with the care  you need, when you need it.  Your next appointment:   1 month(s) after your ablation on 11/26  The format for your next appointment:   In Person  Provider:   You will follow up in the [facility] located at [facility]. Your provider will be: Roderic Palau, NP or Clint R. Fenton, PA-C    Thank you for choosing [facility]!!   Trinidad Curet, RN [phone]   Other Instructions  CT INSTRUCTIONS Your cardiac CT will be scheduled at one of the below locations:   [facility] [address] [place], [place] [address] [phone]  [place] [address] [place], [place] [address] [phone]  If scheduled at [facility], please arrive at the [facility] main entrance of [facility] 30 minutes prior to test start time. Proceed to the [facility] Radiology Department (first floor) to check-in and test prep.  If scheduled at [facility], please arrive 15 mins early for check-in and test prep.  Please follow these instructions carefully (unless otherwise directed):  On the Night Before the Test: . Be sure to Drink plenty of water. . Do not consume any caffeinated/decaffeinated beverages or chocolate 12 hours prior to your test. . Do not take any antihistamines 12 hours prior to your test.  On the Day of the Test: . Drink plenty of water. Do not drink any water within one hour of the test. . Do not eat any food 4 hours prior to the test. . You may take your regular medications prior  to the test.  . Take metoprolol (Lopressor) two hours prior to test. . HOLD Furosemide/Hydrochlorothiazide morning of the test. . FEMALES- please wear underwire-free bra if available      After the Test: . Drink plenty of water. . After receiving IV contrast, you may experience a mild flushed feeling. This is normal. . On occasion, you may experience a  mild rash up to 24 hours after the test. This is not dangerous. If this occurs, you can take Benadryl 25 mg and increase your fluid intake. . If you experience trouble breathing, this can be serious. If it is severe call 911 IMMEDIATELY. If it is mild, please call our office. . If you take any of these medications: Glipizide/Metformin, Avandament, Glucavance, please do not take 48 hours after completing test unless otherwise instructed.   Once we have confirmed authorization from your insurance company, we will call you to set up a date and time for your test. Based on how quickly your insurance processes prior authorizations requests, please allow up to 4 weeks to be contacted for scheduling your Cardiac CT appointment. Be advised that routine Cardiac CT appointments could be scheduled as many as 8 weeks after your provider has ordered it.  For non-scheduling related questions, please contact the cardiac imaging nurse navigator should you have any questions/concerns: Marchia Bond, Cardiac Imaging Nurse Navigator Burley Saver, Interim Cardiac Imaging Nurse [place] [facility] Direct Office Dial: [phone]   For scheduling needs, including cancellations and rescheduling, please call Vivien Rota at [phone], option 3.      Electrophysiology/Ablation Procedure Instructions   You are scheduled for a(n)  Ablation on 05/25/2020 with Dr. Allegra Lai.   1.   Pre procedure testing-             A.  LAB WORK --- On 05/01/2020  for your pre procedure blood work.                 B. COVID TEST-- On 05/23/2020 @ 8:30 am - This is a Drive Up Visit at [address] [place], [place] [address].  Someone will direct you to the appropriate testing line. Stay in your car and someone will be with you shortly.   After you are tested please go home and self quarantine until the day of your procedure.     2. On the day of your procedure 05/25/2020 you will go to [facility]  [phone] [address]) at 8:30 am.  Dennis Bast will go to the main entrance A [employer]) and enter where the [employer] are.  Your driver will drop you off and you will head down the hallway to ADMITTING.  You may have one support person come in to the hospital with you.  They will be asked to wait in the waiting room.   3.   Do not eat or drink after midnight prior to your procedure.   4.   Do not miss any doses of your blood thinner prior to the morning of your procedure or your procedure will need to be rescheduled.       Do NOT take any medications the morning of your procedure.   5.  Plan for an overnight stay, but you may be discharged home after your procedure.    If you use your phone frequently bring your phone charger, in case you have to stay.  If you are discharged after your procedure you will need someone to drive  you home and be with your for 24 hours after your procedure.   6. You will follow up with the AFIB clinic 4 weeks after your procedure.  You will follow up with Dr. Curt Bears  3 months after your procedure.  These appointments will be made for you.   * If you have ANY questions please call the office [phone] and ask for Hajira Verhagen RN or send me a MyChart message   * Occasionally, EP Studies and ablatio ns can become lengthy.  Please make your family aware of this before your procedure starts.  Average time ranges from 2-8 hours for EP studies/ablations.  Your physician will call your family after the procedure with the results.                                    Cardiac Ablation Cardiac ablation is a procedure to disable (ablate) a small amount of heart tissue in very specific places. The heart has many electrical connections. Sometimes these connections are abnormal and can cause the heart to beat very fast or irregularly. Ablating some of the problem areas can improve the heart rhythm or return it to normal. Ablation may be done for people who:  Have  Wolff-Parkinson-White syndrome.  Have fast heart rhythms (tachycardia).  Have taken medicines for an abnormal heart rhythm (arrhythmia) that were not effective or caused side effects.  Have a high-risk heartbeat that may be life-threatening. During the procedure, a small incision is made in the neck or the groin, and a long, thin, flexible tube (catheter) is inserted into the incision and moved to the heart. Small devices (electrodes) on the tip of the catheter will send out electrical currents. A type of X-ray (fluoroscopy) will be used to help guide the catheter and to provide images of the heart. Tell a health care provider about:  Any allergies you have.  All medicines you are taking, including vitamins, herbs, eye drops, creams, and over-the-counter medicines.  Any problems you or family members have had with anesthetic medicines.  Any blood disorders you have.  Any surgeries you have had.  Any medical conditions you have, such as kidney failure.  Whether you are pregnant or may be pregnant. What are the risks? Generally, this is a safe procedure. However, problems may occur, including:  Infection.  Bruising and bleeding at the catheter insertion site.  Bleeding into the chest, especially into the sac that surrounds the heart. This is a serious complication.  Stroke or blood clots.  Damage to other structures or organs.  Allergic reaction to medicines or dyes.  Need for a permanent pacemaker if the normal electrical system is damaged. A pacemaker is a small computer that sends electrical signals to the heart and helps your heart beat normally.  The procedure not being fully effective. This may not be recognized until months later. Repeat ablation procedures are sometimes required. What happens before the procedure?  Follow instructions from your health care provider about eating or drinking restrictions.  Ask your health care provider about: ? Changing or stopping  your regular medicines. This is especially important if you are taking diabetes medicines or blood thinners. ? Taking medicines such as aspirin and ibuprofen. These medicines can thin your blood. Do not take these medicines before your procedure if your health care provider instructs you not to.  Plan to have someone take you home from the hospital or clinic.  If you will be going home right after the procedure, plan to have someone with you for 24 hours. What happens during the procedure?  To lower your risk of infection: ? Your health care team will wash or sanitize their hands. ? Your skin will be washed with soap. ? Hair may be removed from the incision area.  An IV tube will be inserted into one of your veins.  You will be given a medicine to help you relax (sedative).  The skin on your neck or groin will be numbed.  An incision will be made in your neck or your groin.  A needle will be inserted through the incision and into a large vein in your neck or groin.  A catheter will be inserted into the needle and moved to your heart.  Dye may be injected through the catheter to help your surgeon see the area of the heart that needs treatment.  Electrical currents will be sent from the catheter to ablate heart tissue in desired areas. There are three types of energy that may be used to ablate heart tissue: ? Heat (radiofrequency energy). ? Laser energy. ? Extreme cold (cryoablation).  When the necessary tissue has been ablated, the catheter will be removed.  Pressure will be held on the catheter insertion area to prevent excessive bleeding.  A bandage (dressing) will be placed over the catheter insertion area. The procedure may vary among health care providers and hospitals. What happens after the procedure?  Your blood pressure, heart rate, breathing rate, and blood oxygen level will be monitored until the medicines you were given have worn off.  Your catheter insertion area  will be monitored for bleeding. You will need to lie still for a few hours to ensure that you do not bleed from the catheter insertion area.  Do not drive for 24 hours or as long as directed by your health care provider. Summary  Cardiac ablation is a procedure to disable (ablate) a small amount of heart tissue in very specific places. Ablating some of the problem areas can improve the heart rhythm or return it to normal.  During the procedure, electrical currents will be sent from the catheter to ablate heart tissue in desired areas. This information is not intended to replace advice given to you by your health care provider. Make sure you discuss any questions you have with your health care provider. Document Revised: 12/07/2017 Document Reviewed: 05/05/2016 Elsevier Patient Education  [place].

## 2020-05-01 NOTE — H&P (View-Only) (Signed)
Electrophysiology Office Note   Date:  05/01/2020   ID:  NIKYAH LACKMAN, DOB Jun 29, 1951, MRN [id number]  PCP:  Hoyt Koch, MD  Cardiologist: Burt Knack Primary Electrophysiologist:  Valbona Slabach Meredith Leeds, MD    Chief Complaint: Atrial fibrillation   History of Present Illness: EDINA WINNINGHAM is a 69 y.o. female who is being seen today for the evaluation of atrial fibrillation at the request of Liliane Shi, PA-C. Presenting today for electrophysiology evaluation.  She has a history significant for right-sided breast cancer, and paroxysmal atrial fibrillation.  She was initially diagnosed 01/18/2020.  She is currently on both metoprolol and Eliquis.  Her symptoms did improve with metoprolol.  She wore a Zio patch that showed a 5% atrial fibrillation burden with rapid ventricular response.  She continues to have symptoms of palpitations several times a week.  She has occasional weakness and fatigue associated with her atrial fibrillation.  Her most bothersome symptoms are palpitations.  Today, she denies symptoms of palpitations, chest pain, shortness of breath, orthopnea, PND, lower extremity edema, claudication, dizziness, presyncope, syncope, bleeding, or neurologic sequela. The patient is tolerating medications without difficulties.    Past Medical History:  Diagnosis Date  . BACK PAIN, LUMBAR   . Breast cancer, right breast   . FIBROADENOMA, BREAST 2006  . PARESTHESIA    toes  . PONV (postoperative nausea and vomiting)   . Radiation 11/23/14-12/25/14   right breast 42.72 gray, lumpectomy cavity boosted to 54.72 gray  . Wears glasses    Past Surgical History:  Procedure Laterality Date  . atrophic vaginitis    . BREAST LUMPECTOMY WITH RADIOACTIVE SEED LOCALIZATION Right 10/18/2014   Procedure: BREAST LUMPECTOMY WITH RADIOACTIVE SEED LOCALIZATION;  Surgeon: Coralie Keens, MD;  Location: [facility];  Service: General;  Laterality: Right;  .  COLONOSCOPY  09/05/2013  . D & C and hysteroscopy  11/1998   for uterine polyp  . DILATATION & CURRETTAGE/HYSTEROSCOPY WITH RESECTOCOPE N/A 05/30/2015   Procedure: DILATATION & CURETTAGE/HYSTEROSCOPY WITH RESECTOCOPE;  Surgeon: Princess Bruins, MD;  Location: [facility];  Service: Gynecology;  Laterality: N/A;  . DILATION AND CURETTAGE OF UTERUS    . Sterotactix needle biopsy  2000   breast calcification     Current Outpatient Medications  Medication Sig Dispense Refill  . apixaban (ELIQUIS) 5 MG TABS tablet Take 1 tablet (5 mg total) by mouth 2 (two) times daily. 60 tablet 3  . Calcium Carbonate-Vitamin D (CALCIUM 600-D) 600-400 MG-UNIT per tablet Take 1 tablet by mouth daily.     . Cholecalciferol (VITAMIN D3) 1000 UNITS CAPS Take by mouth daily.      . metoprolol succinate (TOPROL-XL) 25 MG 24 hr tablet Take 1 tablet (25 mg total) by mouth daily. 90 tablet 3  . Multiple Vitamin (MULTIVITAMIN) tablet Take 1 tablet by mouth daily.      Vladimir Faster Glycol-Propyl Glycol (SYSTANE OP) Apply 1 drop to eye as needed (For dryness.).    Marland Kitchen Probiotic Product (FORTIFY DAILY PROBIOTIC PO) Take 550 mg by mouth daily.    Marland Kitchen trimethoprim (TRIMPEX) 100 MG tablet Take 100 mg by mouth at bedtime.      No current facility-administered medications for this visit.    Allergies:   Patient has no known allergies.   Social History:  The patient  reports that she has never smoked. She has never used smokeless tobacco. She reports current alcohol use of about 5.0 standard drinks of alcohol per week. She reports  that she does not use drugs.   Family History:  The patient's family history includes Arrhythmia in her mother; Cancer in her mother; Coronary artery disease in her father and maternal grandfather; Diabetes in her father; GER disease in her mother; Hyperlipidemia in her father; Hypertension in her father and mother; Hypothyroidism in her mother; Osteoporosis in her mother.    ROS:  Please see the history of  present illness.   Otherwise, review of systems is positive for none.   All other systems are reviewed and negative.    PHYSICAL EXAM: VS:  BP 110/68   Pulse 72   Ht 5\' 10"  (1.778 m)   Wt 151 lb (68.5 kg)   SpO2 99%   BMI 21.67 kg/m  , BMI Body mass index is 21.67 kg/m. GEN: Well nourished, well developed, in no acute distress  HEENT: normal  Neck: no JVD, carotid bruits, or masses Cardiac: RRR; no murmurs, rubs, or gallops,no edema  Respiratory:  clear to auscultation bilaterally, normal work of breathing GI: soft, nontender, nondistended, + BS MS: no deformity or atrophy  Skin: warm and dry Neuro:  Strength and sensation are intact Psych: euthymic mood, full affect  EKG:  EKG is ordered today. Personal review of the ekg ordered shows sinus rhythm, rate 72  Recent Labs: 01/05/2020: ALT 15; TSH 0.08 04/20/2020: BUN 17; Creatinine, Ser 0.91; Hemoglobin 12.4; Platelets 253; Potassium 5.0; Sodium 138    Lipid Panel     Component Value Date/Time   CHOL 199 05/09/2019 1005   TRIG 123.0 05/09/2019 1005   HDL 77.50 05/09/2019 1005   CHOLHDL 3 05/09/2019 1005   VLDL 24.6 05/09/2019 1005   LDLCALC 97 05/09/2019 1005     Wt Readings from Last 3 Encounters:  05/01/20 151 lb (68.5 kg)  04/20/20 152 lb (68.9 kg)  02/13/20 158 lb (71.7 kg)      Other studies Reviewed: Additional studies/ records that were reviewed today include: TTE 01/31/20  Review of the above records today demonstrates:  1. Left ventricular ejection fraction, by estimation, is 60 to 65%. The  left ventricle has normal function. The left ventricle has no regional  wall motion abnormalities. Left ventricular diastolic parameters were  normal.  2. Right ventricular systolic function is normal. The right ventricular  size is normal. There is normal pulmonary artery systolic pressure.  3. The mitral valve is normal in structure. Trivial mitral valve  regurgitation. No evidence of mitral stenosis.  4. The  aortic valve is tricuspid. Aortic valve regurgitation is trivial.  No aortic stenosis is present.  5. The inferior vena cava is normal in size with greater than 50%  respiratory variability, suggesting right atrial pressure of 3 mmHg.   Monitor 02/21/20 personally reviewed 1. The basic rhythm is normal sinus with an average HR of 71 bpm 2. Atrial fibrillation with rapid ventricular rate occurs with a 5% burden 3. No high-grade heart block or pathologic pauses 4. No bradycardic events 5. No significant ventricular ectopy    ASSESSMENT AND PLAN:  1.  Paroxysmal atrial fibrillation: CHA2DS2-VASc of 2.  Currently on Eliquis and metoprolol.  She wore a cardiac monitor that showed a 5% atrial fibrillation burden at times with rapid rates.  She has atrial fibrillation symptoms of palpitations and occasional fatigue.  She would like to avoid further episodes.   We did discuss the possibility of flecainide versus ablation. She would like to avoid antiarrhythmic medications. Risks and benefits of ablation include, vascular damage heart  block, stroke, damage to chest organs.  She understands these risks and has agreed to the procedure.  Case discussed with referring cardiology  Current medicines are reviewed at length with the patient today.   The patient does not have concerns regarding her medicines.  The following changes were made today:  none  Labs/ tests ordered today include:  Orders Placed This Encounter  Procedures  . CT CARDIAC MORPH/PULM VEIN W/CM&W/O CA SCORE  . Basic metabolic panel  . CBC  . EKG 12-Lead     Disposition:   FU with Chaddrick Brue 3 months  Signed, Glendell Fouse Meredith Leeds, MD  05/01/2020 9:53 AM     [facility] [address] [place] [address] [phone] (office) [phone] (fax)

## 2020-05-01 NOTE — Progress Notes (Signed)
Electrophysiology Office Note   Date:  05/01/2020   ID:  Crystal [name], DOB [birth year], MRN [id number]  PCP:  Hoyt Koch, MD  Cardiologist: Burt Knack Primary Electrophysiologist:  Suesan Mohrmann Meredith Leeds, MD    Chief Complaint: Atrial fibrillation   History of Present Illness: Crystal [name] is a 69 y.o. female who is being seen today for the evaluation of atrial fibrillation at the request of Liliane Shi, PA-C. Presenting today for electrophysiology evaluation.  She has a history significant for right-sided breast cancer, and paroxysmal atrial fibrillation.  She was initially diagnosed 01/18/2020.  She is currently on both metoprolol and Eliquis.  Her symptoms did improve with metoprolol.  She wore a Zio patch that showed a 5% atrial fibrillation burden with rapid ventricular response.  She continues to have symptoms of palpitations several times a week.  She has occasional weakness and fatigue associated with her atrial fibrillation.  Her most bothersome symptoms are palpitations.  Today, she denies symptoms of palpitations, chest pain, shortness of breath, orthopnea, PND, lower extremity edema, claudication, dizziness, presyncope, syncope, bleeding, or neurologic sequela. The patient is tolerating medications without difficulties.    Past Medical History:  Diagnosis Date  . BACK PAIN, LUMBAR   . Breast cancer, right breast   . FIBROADENOMA, BREAST 2006  . PARESTHESIA    toes  . PONV (postoperative nausea and vomiting)   . Radiation 11/23/14-12/25/14   right breast 42.72 gray, lumpectomy cavity boosted to 54.72 gray  . Wears glasses    Past Surgical History:  Procedure Laterality Date  . atrophic vaginitis    . BREAST LUMPECTOMY WITH RADIOACTIVE SEED LOCALIZATION Right 10/18/2014   Procedure: BREAST LUMPECTOMY WITH RADIOACTIVE SEED LOCALIZATION;  Surgeon: Coralie Keens, MD;  Location: [facility];  Service: General;  Laterality: Right;  .  COLONOSCOPY  09/05/2013  . D & C and hysteroscopy  11/1998   for uterine polyp  . DILATATION & CURRETTAGE/HYSTEROSCOPY WITH RESECTOCOPE N/A 05/30/2015   Procedure: DILATATION & CURETTAGE/HYSTEROSCOPY WITH RESECTOCOPE;  Surgeon: Princess Bruins, MD;  Location: [facility];  Service: Gynecology;  Laterality: N/A;  . DILATION AND CURETTAGE OF UTERUS    . Sterotactix needle biopsy  2000   breast calcification     Current Outpatient Medications  Medication Sig Dispense Refill  . apixaban (ELIQUIS) 5 MG TABS tablet Take 1 tablet (5 mg total) by mouth 2 (two) times daily. 60 tablet 3  . Calcium Carbonate-Vitamin D (CALCIUM 600-D) 600-400 MG-UNIT per tablet Take 1 tablet by mouth daily.     . Cholecalciferol (VITAMIN D3) 1000 UNITS CAPS Take by mouth daily.      . metoprolol succinate (TOPROL-XL) 25 MG 24 hr tablet Take 1 tablet (25 mg total) by mouth daily. 90 tablet 3  . Multiple Vitamin (MULTIVITAMIN) tablet Take 1 tablet by mouth daily.      Vladimir Faster Glycol-Propyl Glycol (SYSTANE OP) Apply 1 drop to eye as needed (For dryness.).    Marland Kitchen Probiotic Product (FORTIFY DAILY PROBIOTIC PO) Take 550 mg by mouth daily.    Marland Kitchen trimethoprim (TRIMPEX) 100 MG tablet Take 100 mg by mouth at bedtime.      No current facility-administered medications for this visit.    Allergies:   Patient has no known allergies.   Social History:  The patient  reports that she has never smoked. She has never used smokeless tobacco. She reports current alcohol use of about 5.0 standard drinks of alcohol per week. She reports  that she does not use drugs.   Family History:  The patient's family history includes Arrhythmia in her mother; Cancer in her mother; Coronary artery disease in her father and maternal grandfather; Diabetes in her father; GER disease in her mother; Hyperlipidemia in her father; Hypertension in her father and mother; Hypothyroidism in her mother; Osteoporosis in her mother.    ROS:  Please see the history of  present illness.   Otherwise, review of systems is positive for none.   All other systems are reviewed and negative.    PHYSICAL EXAM: VS:  BP 110/68   Pulse 72   Ht 5\' 10"  (1.778 m)   Wt 151 lb (68.5 kg)   SpO2 99%   BMI 21.67 kg/m  , BMI Body mass index is 21.67 kg/m. GEN: Well nourished, well developed, in no acute distress  HEENT: normal  Neck: no JVD, carotid bruits, or masses Cardiac: RRR; no murmurs, rubs, or gallops,no edema  Respiratory:  clear to auscultation bilaterally, normal work of breathing GI: soft, nontender, nondistended, + BS MS: no deformity or atrophy  Skin: warm and dry Neuro:  Strength and sensation are intact Psych: euthymic mood, full affect  EKG:  EKG is ordered today. Personal review of the ekg ordered shows sinus rhythm, rate 72  Recent Labs: 01/05/2020: ALT 15; TSH 0.08 04/20/2020: BUN 17; Creatinine, Ser 0.91; Hemoglobin 12.4; Platelets 253; Potassium 5.0; Sodium 138    Lipid Panel     Component Value Date/Time   CHOL 199 05/09/2019 1005   TRIG 123.0 05/09/2019 1005   HDL 77.50 05/09/2019 1005   CHOLHDL 3 05/09/2019 1005   VLDL 24.6 05/09/2019 1005   LDLCALC 97 05/09/2019 1005     Wt Readings from Last 3 Encounters:  05/01/20 151 lb (68.5 kg)  04/20/20 152 lb (68.9 kg)  02/13/20 158 lb (71.7 kg)      Other studies Reviewed: Additional studies/ records that were reviewed today include: TTE 01/31/20  Review of the above records today demonstrates:  1. Left ventricular ejection fraction, by estimation, is 60 to 65%. The  left ventricle has normal function. The left ventricle has no regional  wall motion abnormalities. Left ventricular diastolic parameters were  normal.  2. Right ventricular systolic function is normal. The right ventricular  size is normal. There is normal pulmonary artery systolic pressure.  3. The mitral valve is normal in structure. Trivial mitral valve  regurgitation. No evidence of mitral stenosis.  4. The  aortic valve is tricuspid. Aortic valve regurgitation is trivial.  No aortic stenosis is present.  5. The inferior vena cava is normal in size with greater than 50%  respiratory variability, suggesting right atrial pressure of 3 mmHg.   Monitor 02/21/20 personally reviewed 1. The basic rhythm is normal sinus with an average HR of 71 bpm 2. Atrial fibrillation with rapid ventricular rate occurs with a 5% burden 3. No high-grade heart block or pathologic pauses 4. No bradycardic events 5. No significant ventricular ectopy    ASSESSMENT AND PLAN:  1.  Paroxysmal atrial fibrillation: CHA2DS2-VASc of 2.  Currently on Eliquis and metoprolol.  She wore a cardiac monitor that showed a 5% atrial fibrillation burden at times with rapid rates.  She has atrial fibrillation symptoms of palpitations and occasional fatigue.  She would like to avoid further episodes.   We did discuss the possibility of flecainide versus ablation. She would like to avoid antiarrhythmic medications. Risks and benefits of ablation include, vascular damage heart  block, stroke, damage to chest organs.  She understands these risks and has agreed to the procedure.  Case discussed with referring cardiology  Current medicines are reviewed at length with the patient today.   The patient does not have concerns regarding her medicines.  The following changes were made today:  none  Labs/ tests ordered today include:  Orders Placed This Encounter  Procedures  . CT CARDIAC MORPH/PULM VEIN W/CM&W/O CA SCORE  . Basic metabolic panel  . CBC  . EKG 12-Lead     Disposition:   FU with Sonyia Muro 3 months  Signed, Kashmere Daywalt Meredith Leeds, MD  05/01/2020 9:53 AM     [facility] [address] [place] [address] [phone] (office) [phone] (fax)

## 2020-05-02 ENCOUNTER — Other Ambulatory Visit: Payer: Self-pay | Admitting: Internal Medicine

## 2020-05-16 ENCOUNTER — Telehealth (HOSPITAL_COMMUNITY): Payer: Self-pay | Admitting: Emergency Medicine

## 2020-05-16 NOTE — Telephone Encounter (Signed)
Reaching out to patient to offer assistance regarding upcoming cardiac imaging study; pt verbalizes understanding of appt date/time, parking situation and where to check in, pre-test NPO status and medications ordered, and verified current allergies; name and call back number provided for further questions should they arise Addy Mcmannis RN Navigator Cardiac Imaging [facility] [phone] office [phone] cell 

## 2020-05-18 ENCOUNTER — Ambulatory Visit (HOSPITAL_COMMUNITY)
Admission: RE | Admit: 2020-05-18 | Discharge: 2020-05-18 | Disposition: A | Discharge status: Home / Self Care | Payer: Medicare Other | Source: Ambulatory Visit | Attending: Cardiology | Admitting: Cardiology

## 2020-05-18 ENCOUNTER — Other Ambulatory Visit: Payer: Self-pay

## 2020-05-18 DIAGNOSIS — I480 Paroxysmal atrial fibrillation: Secondary | ICD-10-CM | POA: Insufficient documentation

## 2020-05-18 MED ORDER — IOHEXOL 350 MG/ML SOLN
160.0000 mL | Freq: Once | INTRAVENOUS | Status: AC | PRN
  Administered 2020-05-18: 160 mL via INTRAVENOUS

## 2020-05-23 ENCOUNTER — Other Ambulatory Visit (HOSPITAL_COMMUNITY)
Admission: RE | Admit: 2020-05-23 | Discharge: 2020-05-23 | Disposition: A | Discharge status: Home / Self Care | Payer: Medicare Other | Source: Ambulatory Visit | Attending: Cardiology | Admitting: Cardiology

## 2020-05-23 DIAGNOSIS — Z01812 Encounter for preprocedural laboratory examination: Secondary | ICD-10-CM | POA: Diagnosis present

## 2020-05-23 DIAGNOSIS — Z20822 Contact with and (suspected) exposure to covid-19: Secondary | ICD-10-CM | POA: Insufficient documentation

## 2020-05-23 LAB — SARS CORONAVIRUS 2 (TAT 6-24 HRS): SARS Coronavirus 2: NEGATIVE

## 2020-05-23 NOTE — Progress Notes (Signed)
Instructed patient on the following items: Arrival time 0830 Nothing to eat or drink after midnight No meds AM of procedure Responsible person to drive you home and stay with you for 24 hrs  Have you missed any doses of anti-coagulant Eliquis

## 2020-05-25 ENCOUNTER — Ambulatory Visit (HOSPITAL_COMMUNITY): Payer: Medicare Other | Admitting: Certified Registered"

## 2020-05-25 ENCOUNTER — Other Ambulatory Visit: Payer: Self-pay

## 2020-05-25 ENCOUNTER — Encounter (HOSPITAL_COMMUNITY): Payer: Self-pay | Admitting: Cardiology

## 2020-05-25 ENCOUNTER — Ambulatory Visit (HOSPITAL_COMMUNITY)
Admission: RE | Admit: 2020-05-25 | Discharge: 2020-05-25 | Disposition: A | Discharge status: Home / Self Care | Payer: Medicare Other | Attending: Cardiology | Admitting: Cardiology

## 2020-05-25 ENCOUNTER — Encounter (HOSPITAL_COMMUNITY)
Admission: RE | Disposition: A | Discharge status: Home / Self Care | Payer: Self-pay | Source: Home / Self Care | Attending: Cardiology

## 2020-05-25 DIAGNOSIS — Z7901 Long term (current) use of anticoagulants: Secondary | ICD-10-CM | POA: Diagnosis not present

## 2020-05-25 DIAGNOSIS — Z923 Personal history of irradiation: Secondary | ICD-10-CM | POA: Insufficient documentation

## 2020-05-25 DIAGNOSIS — Z79899 Other long term (current) drug therapy: Secondary | ICD-10-CM | POA: Insufficient documentation

## 2020-05-25 DIAGNOSIS — Z853 Personal history of malignant neoplasm of breast: Secondary | ICD-10-CM | POA: Diagnosis not present

## 2020-05-25 DIAGNOSIS — I480 Paroxysmal atrial fibrillation: Secondary | ICD-10-CM | POA: Insufficient documentation

## 2020-05-25 DIAGNOSIS — Z8249 Family history of ischemic heart disease and other diseases of the circulatory system: Secondary | ICD-10-CM | POA: Insufficient documentation

## 2020-05-25 HISTORY — PX: ATRIAL FIBRILLATION ABLATION: EP1191

## 2020-05-25 LAB — POCT ACTIVATED CLOTTING TIME
Activated Clotting Time: 246 seconds
Activated Clotting Time: 296 seconds
Activated Clotting Time: 301 seconds

## 2020-05-25 SURGERY — ATRIAL FIBRILLATION ABLATION
Anesthesia: General

## 2020-05-25 MED ORDER — FENTANYL CITRATE (PF) 100 MCG/2ML IJ SOLN
INTRAMUSCULAR | Status: DC | PRN
  Administered 2020-05-25: 100 ug via INTRAVENOUS

## 2020-05-25 MED ORDER — HEPARIN SODIUM (PORCINE) 1000 UNIT/ML IJ SOLN
INTRAMUSCULAR | Status: DC | PRN
  Administered 2020-05-25: 1000 [IU] via INTRAVENOUS

## 2020-05-25 MED ORDER — SODIUM CHLORIDE 0.9% FLUSH: 3.0000 mL | Freq: Two times a day (BID) | INTRAVENOUS | Status: DC

## 2020-05-25 MED ORDER — LIDOCAINE 2% (20 MG/ML) 5 ML SYRINGE
INTRAMUSCULAR | Status: DC | PRN
  Administered 2020-05-25: 100 mg via INTRAVENOUS

## 2020-05-25 MED ORDER — HEPARIN SODIUM (PORCINE) 1000 UNIT/ML IJ SOLN
INTRAMUSCULAR | Status: AC
  Filled 2020-05-25: qty 1

## 2020-05-25 MED ORDER — HEPARIN (PORCINE) IN NACL 1000-0.9 UT/500ML-% IV SOLN
INTRAVENOUS | Status: DC | PRN
  Administered 2020-05-25 (×5): 500 mL

## 2020-05-25 MED ORDER — ONDANSETRON HCL 4 MG/2ML IJ SOLN
INTRAMUSCULAR | Status: DC | PRN
  Administered 2020-05-25: 4 mg via INTRAVENOUS

## 2020-05-25 MED ORDER — ROCURONIUM BROMIDE 10 MG/ML (PF) SYRINGE
PREFILLED_SYRINGE | INTRAVENOUS | Status: DC | PRN
  Administered 2020-05-25: 70 mg via INTRAVENOUS

## 2020-05-25 MED ORDER — PHENYLEPHRINE 40 MCG/ML (10ML) SYRINGE FOR IV PUSH (FOR BLOOD PRESSURE SUPPORT)
PREFILLED_SYRINGE | INTRAVENOUS | Status: DC | PRN
  Administered 2020-05-25: 120 ug via INTRAVENOUS

## 2020-05-25 MED ORDER — DEXAMETHASONE SODIUM PHOSPHATE 10 MG/ML IJ SOLN
INTRAMUSCULAR | Status: DC | PRN
  Administered 2020-05-25: 10 mg via INTRAVENOUS

## 2020-05-25 MED ORDER — SODIUM CHLORIDE 0.9 % IV SOLN: 250.0000 mL | INTRAVENOUS | Status: DC | PRN

## 2020-05-25 MED ORDER — PROPOFOL 10 MG/ML IV BOLUS
INTRAVENOUS | Status: DC | PRN
  Administered 2020-05-25: 140 mg via INTRAVENOUS

## 2020-05-25 MED ORDER — ACETAMINOPHEN 325 MG PO TABS
650.0000 mg | ORAL_TABLET | ORAL | Status: DC | PRN
  Filled 2020-05-25: qty 2

## 2020-05-25 MED ORDER — HEPARIN SODIUM (PORCINE) 1000 UNIT/ML IJ SOLN
INTRAMUSCULAR | Status: DC | PRN
  Administered 2020-05-25: 14000 [IU] via INTRAVENOUS
  Administered 2020-05-25: 4000 [IU] via INTRAVENOUS
  Administered 2020-05-25: 6000 [IU] via INTRAVENOUS
  Administered 2020-05-25: 3000 [IU] via INTRAVENOUS

## 2020-05-25 MED ORDER — HEPARIN (PORCINE) IN NACL 1000-0.9 UT/500ML-% IV SOLN
INTRAVENOUS | Status: AC
  Filled 2020-05-25: qty 2500

## 2020-05-25 MED ORDER — DOBUTAMINE IN D5W 4-5 MG/ML-% IV SOLN
INTRAVENOUS | Status: AC
  Filled 2020-05-25: qty 250

## 2020-05-25 MED ORDER — SODIUM CHLORIDE 0.9% FLUSH: 3.0000 mL | INTRAVENOUS | Status: DC | PRN

## 2020-05-25 MED ORDER — PHENYLEPHRINE HCL-NACL 10-0.9 MG/250ML-% IV SOLN
INTRAVENOUS | Status: DC | PRN
  Administered 2020-05-25: 25 ug/min via INTRAVENOUS

## 2020-05-25 MED ORDER — SODIUM CHLORIDE 0.9 % IV SOLN: INTRAVENOUS | Status: DC

## 2020-05-25 MED ORDER — DOBUTAMINE IN D5W 4-5 MG/ML-% IV SOLN
INTRAVENOUS | Status: DC | PRN
  Administered 2020-05-25: 20 ug/kg/min via INTRAVENOUS

## 2020-05-25 MED ORDER — PROTAMINE SULFATE 10 MG/ML IV SOLN
INTRAVENOUS | Status: DC | PRN
  Administered 2020-05-25: 40 mg via INTRAVENOUS

## 2020-05-25 MED ORDER — SUGAMMADEX SODIUM 200 MG/2ML IV SOLN
INTRAVENOUS | Status: DC | PRN
  Administered 2020-05-25: 140 mg via INTRAVENOUS

## 2020-05-25 MED ORDER — MIDAZOLAM HCL 2 MG/2ML IJ SOLN
INTRAMUSCULAR | Status: DC | PRN
  Administered 2020-05-25: 2 mg via INTRAVENOUS

## 2020-05-25 MED ORDER — ONDANSETRON HCL 4 MG/2ML IJ SOLN: 4.0000 mg | Freq: Four times a day (QID) | INTRAMUSCULAR | Status: DC | PRN

## 2020-05-25 SURGICAL SUPPLY — 21 items
BAG SNAP BAND KOVER 36X36 (MISCELLANEOUS) ×1 IMPLANT
BLANKET WARM UNDERBOD FULL ACC (MISCELLANEOUS) ×2 IMPLANT
CATH MAPPNG PENTARAY F 2-6-2MM (CATHETERS) IMPLANT
CATH S CIRCA THERM PROBE 10F (CATHETERS) ×1 IMPLANT
CATH SMTCH THERMOCOOL SF DF (CATHETERS) ×1 IMPLANT
CATH SOUNDSTAR ECO 8FR (CATHETERS) ×1 IMPLANT
CATH WEB BI DIR CSDF CRV REPRO (CATHETERS) ×1 IMPLANT
CLOSURE PERCLOSE PROSTYLE (VASCULAR PRODUCTS) ×4 IMPLANT
COVER SWIFTLINK CONNECTOR (BAG) ×2 IMPLANT
KIT VERSACROSS STEERABLE D1 (CATHETERS) ×1 IMPLANT
PACK EP LATEX FREE (CUSTOM PROCEDURE TRAY) ×2
PACK EP LF (CUSTOM PROCEDURE TRAY) ×1 IMPLANT
PAD PRO RADIOLUCENT 2001M-C (PAD) ×2 IMPLANT
PATCH CARTO3 (PAD) ×1 IMPLANT
PENTARAY F 2-6-2MM (CATHETERS) ×2
SHEATH CARTO VIZIGO SM CVD (SHEATH) ×1 IMPLANT
SHEATH PINNACLE 7F 10CM (SHEATH) ×1 IMPLANT
SHEATH PINNACLE 8F 10CM (SHEATH) ×2 IMPLANT
SHEATH PINNACLE 9F 10CM (SHEATH) ×1 IMPLANT
SHEATH PROBE COVER 6X72 (BAG) ×1 IMPLANT
TUBING SMART ABLATE COOLFLOW (TUBING) ×1 IMPLANT

## 2020-05-25 NOTE — Anesthesia Procedure Notes (Signed)

## 2020-05-25 NOTE — Progress Notes (Signed)
Up and walked and tolerated well; bilat groins stable, no bleeding or hematoma 

## 2020-05-25 NOTE — Transfer of Care (Signed)
Immediate Anesthesia Transfer of Care Note  Patient: Crystal [name]  Procedure(s) Performed: ATRIAL FIBRILLATION ABLATION (N/A )  Patient Location: Cath Lab  Anesthesia Type:General  Level of Consciousness: awake, alert  and oriented  Airway & Oxygen Therapy: Patient Spontanous Breathing  Post-op Assessment: Report given to RN  Post vital signs: Reviewed and stable  Last Vitals:  Vitals Value Taken Time  BP    Temp    Pulse    Resp    SpO2      Last Pain:  Vitals:   05/25/20 0903  TempSrc:   PainSc: 0-No pain         Complications: No complications documented.

## 2020-05-25 NOTE — Discharge Instructions (Addendum)

## 2020-05-25 NOTE — Anesthesia Preprocedure Evaluation (Addendum)
Anesthesia Evaluation  Patient identified by MRN, date of birth, ID band Patient awake    Reviewed: Allergy & Precautions, NPO status , Patient's Chart, lab work & pertinent test results  History of Anesthesia Complications (+) PONV and history of anesthetic complications  Airway Mallampati: I  TM Distance: >3 FB Neck ROM: Full    Dental   Pulmonary neg pulmonary ROS,    Pulmonary exam normal        Cardiovascular Normal cardiovascular exam+ dysrhythmias Atrial Fibrillation  Rhythm:Irregular  ECG: rate 72   ECHO: Left ventricular ejection fraction, by estimation, is 60 to 65%. The left ventricle has normal function. The left ventricle has no regional wall motion abnormalities. Left ventricular diastolic parameters were normal. 2. Right ventricular systolic function is normal. The right ventricular size is normal. There is normal pulmonary artery systolic pressure. 3. The mitral valve is normal in structure. Trivial mitral valve regurgitation. No evidence of mitral stenosis. 4. The aortic valve is tricuspid. Aortic valve regurgitation is trivial. No aortic stenosis is present. 5. The inferior vena cava is normal in size with greater than 50% respiratory variability, suggesting right atrial pressure of 3 mmHg.   Neuro/Psych  Neuromuscular disease    GI/Hepatic negative GI ROS, Neg liver ROS,   Endo/Other  negative endocrine ROS  Renal/GU negative Renal ROS     Musculoskeletal BACK PAIN, LUMBAR   Abdominal   Peds  Hematology negative hematology ROS (+)   Anesthesia Other Findings A-fib   Reproductive/Obstetrics                            Anesthesia Physical Anesthesia Plan  ASA: III  Anesthesia Plan: General   Post-op Pain Management:    Induction: Intravenous  PONV Risk Score and Plan: 4 or greater and Ondansetron, Dexamethasone, Midazolam and Treatment may vary due to age or medical  condition  Airway Management Planned: Oral ETT  Additional Equipment:   Intra-op Plan:   Post-operative Plan: Extubation in OR  Informed Consent: I have reviewed the patients History and Physical, chart, labs and discussed the procedure including the risks, benefits and alternatives for the proposed anesthesia with the patient or authorized representative who has indicated his/her understanding and acceptance.       Plan Discussed with: CRNA and Surgeon  Anesthesia Plan Comments:        Anesthesia Quick Evaluation

## 2020-05-25 NOTE — Interval H&P Note (Signed)
History and Physical Interval Note:  Crystal [name] has presented today for surgery, with the diagnosis of atrial fibrillation.  The various methods of treatment have been discussed with the patient and family. After consideration of risks, benefits and other options for treatment, the patient has consented to  Procedure(s): Catheter ablation as a surgical intervention .  Risks include but not limited to complete heart block, stroke, esophageal damage, nerve damage, bleeding, vascular damage, tamponade, perforation, MI, and death. The patient's history has been reviewed, patient examined, no change in status, stable for surgery.  I have reviewed the patient's chart and labs.  Questions were answered to the patient's satisfaction.    Sherra Kimmons Curt Bears, MD 05/25/2020 10:59 AM

## 2020-05-27 NOTE — Anesthesia Postprocedure Evaluation (Signed)
Anesthesia Post Note  Patient: Crystal [name]  Procedure(s) Performed: ATRIAL FIBRILLATION ABLATION (N/A )     Patient location during evaluation: PACU Anesthesia Type: General Level of consciousness: awake and alert Pain management: pain level controlled Vital Signs Assessment: post-procedure vital signs reviewed and stable Respiratory status: spontaneous breathing, nonlabored ventilation, respiratory function stable and patient connected to nasal cannula oxygen Cardiovascular status: blood pressure returned to baseline and stable Postop Assessment: no apparent nausea or vomiting Anesthetic complications: no   No complications documented.  Last Vitals:  Vitals:   05/25/20 1730 05/25/20 1800  BP: 117/62 112/62  Pulse: 71 70  Resp: (!) 22 17  Temp:    SpO2: 100% 99%    Last Pain:  Vitals:   05/25/20 1546  TempSrc: Temporal  PainSc: 0-No pain                 Tansy Lorek DAVID

## 2020-05-28 ENCOUNTER — Encounter: Payer: BC Managed Care – PPO | Admitting: Obstetrics & Gynecology

## 2020-05-28 ENCOUNTER — Encounter (HOSPITAL_COMMUNITY): Payer: Self-pay | Admitting: Cardiology

## 2020-05-29 ENCOUNTER — Ambulatory Visit (INDEPENDENT_AMBULATORY_CARE_PROVIDER_SITE_OTHER): Payer: Medicare Other | Admitting: Obstetrics & Gynecology

## 2020-05-29 ENCOUNTER — Other Ambulatory Visit: Payer: Self-pay

## 2020-05-29 ENCOUNTER — Encounter: Payer: Self-pay | Admitting: Obstetrics & Gynecology

## 2020-05-29 ENCOUNTER — Other Ambulatory Visit: Payer: Self-pay | Admitting: Internal Medicine

## 2020-05-29 VITALS — BP 122/70 | Ht 69.5 in | Wt 151.0 lb

## 2020-05-29 DIAGNOSIS — Z23 Encounter for immunization: Secondary | ICD-10-CM

## 2020-05-29 DIAGNOSIS — C50511 Malignant neoplasm of lower-outer quadrant of right female breast: Secondary | ICD-10-CM

## 2020-05-29 DIAGNOSIS — Z01419 Encounter for gynecological examination (general) (routine) without abnormal findings: Secondary | ICD-10-CM | POA: Diagnosis not present

## 2020-05-29 DIAGNOSIS — Z853 Personal history of malignant neoplasm of breast: Secondary | ICD-10-CM

## 2020-05-29 DIAGNOSIS — Z9289 Personal history of other medical treatment: Secondary | ICD-10-CM

## 2020-05-29 DIAGNOSIS — Z170 Estrogen receptor positive status [ER+]: Secondary | ICD-10-CM

## 2020-05-29 DIAGNOSIS — Z780 Asymptomatic menopausal state: Secondary | ICD-10-CM

## 2020-05-29 DIAGNOSIS — M8589 Other specified disorders of bone density and structure, multiple sites: Secondary | ICD-10-CM

## 2020-05-29 NOTE — Addendum Note (Signed)
Addended by: Thurnell Garbe A on: 05/29/2020 08:59 AM   Modules accepted: Orders

## 2020-05-29 NOTE — Progress Notes (Signed)
GERARDA CONKLIN 1950/07/06 [id number]   History:    69 y.o. G2P2L2 Married  EZ:[id number] presenting for annual gyn exam   CLE:[id number], well on no hormone replacement therapy. No postmenopausal bleeding. History of right breast cancer in situ in 2016, status post lumpectomy with radiation therapy.  Finished Anastrozole 01/2020. No pelvic pain. Rarely sexually active because of pain and dryness. Did not try with coconut oil so far. 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 Bowel movements normal. Health labs with family physician. Good body mass index at 21.98. Enjoys walking.  Had an Ablation for AF recently, successful.  Past medical history,surgical history, family history and social history were all reviewed and documented in the EPIC chart.  Gynecologic History No LMP recorded. Patient is postmenopausal.  Obstetric History OB History  Gravida Para Term Preterm AB Living  2 2       2   SAB TAB Ectopic Multiple Live Births               # Outcome Date GA Lbr Len/2nd Weight Sex Delivery Anes PTL Lv  2 Para           1 Para              ROS: A ROS was performed and pertinent positives and negatives are included in the history.  GENERAL: No fevers or chills. HEENT: No change in vision, no earache, sore throat or sinus congestion. NECK: No pain or stiffness. CARDIOVASCULAR: No chest pain or pressure. No palpitations. PULMONARY: No shortness of breath, cough or wheeze. GASTROINTESTINAL: No abdominal pain, nausea, vomiting or diarrhea, melena or bright red blood per rectum. GENITOURINARY: No urinary frequency, urgency, hesitancy or dysuria. MUSCULOSKELETAL: No joint or muscle pain, no back pain, no recent trauma. DERMATOLOGIC: No rash, no itching, no lesions. ENDOCRINE: No polyuria, polydipsia, no heat or cold intolerance. No recent change in weight. HEMATOLOGICAL: No anemia or easy bruising or bleeding. NEUROLOGIC: No headache,  seizures, numbness, tingling or weakness. PSYCHIATRIC: No depression, no loss of interest in normal activity or change in sleep pattern.     Exam:   BP 122/70   Ht 5' 9.5" (1.765 m)   Wt 151 lb (68.5 kg)   BMI 21.98 kg/m   Body mass index is 21.98 kg/m.  General appearance : Well developed well nourished female. No acute distress HEENT: Eyes: no retinal hemorrhage or exudates,  Neck supple, trachea midline, no carotid bruits, no thyroidmegaly Lungs: Clear to auscultation, no rhonchi or wheezes, or rib retractions  Heart: Regular rate and rhythm, no murmurs or gallops Breast:Examined in sitting and supine position were symmetrical in appearance, no palpable masses or tenderness,  no skin retraction, no nipple inversion, no nipple discharge, no skin discoloration, no axillary or supraclavicular lymphadenopathy Abdomen: no palpable masses or tenderness, no rebound or guarding Extremities: no edema or skin discoloration or tenderness  Pelvic: Vulva: Normal             Vagina: No gross lesions or discharge  Cervix: No gross lesions or discharge.  Pap reflex done.  Uterus  AV, normal size, shape and consistency, non-tender and mobile  Adnexa  Without masses or tenderness  Anus: Normal   Assessment/Plan:  69 y.o. female for annual exam   1. Encounter for routine gynecological examination with Papanicolaou smear of cervix Normal gynecologic exam in menopause.  Pap reflex done.  Breast exam normal.  Colonoscopy 2015.  Health labs with family physician.  Good body  mass index at 21.98.  Continue with walking and increase light weightlifting and stretching to every 2 days.  2. Postmenopausal Well on no HRT.  No PMB.  3. Osteopenia of multiple sites [facility] at [facility] 06/28/2019 Osteopenia with lowest T-Score at -1.9 stable.  Will repeat in 1 year.  Continue with Vit D supplements, Ca++ 1500 mg daily and regular weightbearing physical activities.  4. Malignant neoplasm of lower-outer quadrant  of right breast of female, estrogen receptor positive ([place]) Finished Anastrozole 01/2020.  Mammo neg 02/2020.  Princess Bruins MD, 8:24 AM 05/29/2020

## 2020-05-29 NOTE — Addendum Note (Signed)
Addended by: Thurnell Garbe A on: 05/29/2020 09:47 AM   Modules accepted: Orders

## 2020-05-30 ENCOUNTER — Telehealth: Payer: Self-pay | Admitting: Internal Medicine

## 2020-05-30 ENCOUNTER — Encounter: Payer: Self-pay | Admitting: Internal Medicine

## 2020-05-30 ENCOUNTER — Ambulatory Visit (INDEPENDENT_AMBULATORY_CARE_PROVIDER_SITE_OTHER): Payer: Medicare Other | Admitting: Internal Medicine

## 2020-05-30 VITALS — Ht 69.5 in

## 2020-05-30 DIAGNOSIS — R509 Fever, unspecified: Secondary | ICD-10-CM | POA: Diagnosis not present

## 2020-05-30 DIAGNOSIS — J069 Acute upper respiratory infection, unspecified: Secondary | ICD-10-CM | POA: Diagnosis not present

## 2020-05-30 DIAGNOSIS — H6501 Acute serous otitis media, right ear: Secondary | ICD-10-CM

## 2020-05-30 DIAGNOSIS — R059 Cough, unspecified: Secondary | ICD-10-CM

## 2020-05-30 LAB — PAP IG W/ RFLX HPV ASCU

## 2020-05-30 MED ORDER — AZITHROMYCIN 250 MG PO TABS: ORAL_TABLET | ORAL | 0 refills | Status: DC

## 2020-05-30 NOTE — Telephone Encounter (Signed)
Pt is having sinus issues, headache, sounds like fluid in ear, had 99.5 fever last night and had 99.4 fever this morning but was gone before she went to work. Has been coughing up mucus as well. She wanted to see if she could see Dr Renold Genta since she is right next door. Pt did have flu shot yesterday. She said her last covid test was last Thursday and it was negative

## 2020-05-30 NOTE — Telephone Encounter (Signed)
She will be here at 1pm

## 2020-06-01 NOTE — Progress Notes (Signed)
° °  Subjective:    Patient ID: Roselind Rily, female    DOB: 06/18/1951, 69 y.o.   MRN: [id number]  HPI 69 year old Female see with right ear pain and respiratory congestion.  Diagnosed in July with Atrial fib by Dr. Delma Officer, now seeing Cardiologist, Dr. Curt Bears. Underwent Afib ablation on November 26th. Had endotracheal intubation. Congestion developed afterward.  She had high-dose flu vaccine November 30.  Has had 2 Moderna vaccines in February and March of this year.  No known COVID-19 exposure.  Has had low-grade fever 99.4 degrees.  Some productive cough.  Mild headache and right ear discomfort.    Review of Systems no dysgeusia or shaking chills.  Had Covid test which was negative on November 24 before cardiac ablation.     Objective:   Physical Exam Temperature 98.6 degrees.  Sitting in waiting room in no acute distress.  Pharynx slightly injected.  Left TM clear.  Right TM pink and slightly full with splayed light reflex.  Chest clear to auscultation without rales or wheezing.  Respiratory virus panel negative.     Assessment & Plan:  Serous right otitis media  Acute upper respiratory infection  Plan: Zithromax Z-PAK 2 p.o. day 1 followed by 1 p.o. days 2 through 5.  Rest and drink plenty of fluids.

## 2020-06-02 LAB — RESPIRATORY VIRUS PANEL

## 2020-06-20 NOTE — Patient Instructions (Signed)
Respiratory virus panel pending.  Take Zithromax Z-PAK 2 p.o. day 1 followed by 1 p.o. days 2 through 5.  Rest and drink plenty of fluids.

## 2020-06-25 ENCOUNTER — Other Ambulatory Visit: Payer: Self-pay

## 2020-06-25 ENCOUNTER — Encounter (HOSPITAL_COMMUNITY): Payer: Self-pay | Admitting: Nurse Practitioner

## 2020-06-25 ENCOUNTER — Ambulatory Visit (HOSPITAL_COMMUNITY)
Admission: RE | Admit: 2020-06-25 | Discharge: 2020-06-25 | Disposition: A | Discharge status: Home / Self Care | Payer: Medicare Other | Source: Ambulatory Visit | Attending: Nurse Practitioner | Admitting: Nurse Practitioner

## 2020-06-25 VITALS — BP 110/76 | HR 73 | Ht 69.5 in | Wt 149.8 lb

## 2020-06-25 DIAGNOSIS — Z7901 Long term (current) use of anticoagulants: Secondary | ICD-10-CM | POA: Insufficient documentation

## 2020-06-25 DIAGNOSIS — I480 Paroxysmal atrial fibrillation: Secondary | ICD-10-CM | POA: Diagnosis present

## 2020-06-25 DIAGNOSIS — D6869 Other thrombophilia: Secondary | ICD-10-CM

## 2020-06-25 DIAGNOSIS — Z79899 Other long term (current) drug therapy: Secondary | ICD-10-CM | POA: Insufficient documentation

## 2020-06-25 DIAGNOSIS — Z8249 Family history of ischemic heart disease and other diseases of the circulatory system: Secondary | ICD-10-CM | POA: Diagnosis not present

## 2020-06-25 DIAGNOSIS — Z853 Personal history of malignant neoplasm of breast: Secondary | ICD-10-CM | POA: Insufficient documentation

## 2020-06-25 NOTE — Progress Notes (Signed)
Primary Care Physician: Hoyt Koch, MD Referring Physician: Dr. Erasmo Downer is a 69 y.o. female with a h/o right-sided breast cancer, and paroxysmal atrial fibrillation that recently underwent ablation with Dr. Curt Bears on 11/26. She  presents in NSR today by EKG. She has not noted any swallowing or groin issues. She is  being compliant with eliquis 5 mg bid with a CHA2DS2VASc score of 2. She has noted some intermittent afib but nothing that makes her stop her activities or need extra medication.   Today, she denies symptoms of palpitations, chest pain, shortness of breath, orthopnea, PND, lower extremity edema, dizziness, presyncope, syncope, or neurologic sequela. The patient is tolerating medications without difficulties and is otherwise without complaint today.   Past Medical History:  Diagnosis Date  . BACK PAIN, LUMBAR   . Breast cancer, right breast   . FIBROADENOMA, BREAST 2006  . PARESTHESIA    toes  . PONV (postoperative nausea and vomiting)   . Radiation 11/23/14-12/25/14   right breast 42.72 gray, lumpectomy cavity boosted to 54.72 gray  . Wears glasses    Past Surgical History:  Procedure Laterality Date  . ATRIAL FIBRILLATION ABLATION N/A 05/25/2020   Procedure: ATRIAL FIBRILLATION ABLATION;  Surgeon: Constance Haw, MD;  Location: [facility];  Service: Cardiovascular;  Laterality: N/A;  . atrophic vaginitis    . BREAST LUMPECTOMY WITH RADIOACTIVE SEED LOCALIZATION Right 10/18/2014   Procedure: BREAST LUMPECTOMY WITH RADIOACTIVE SEED LOCALIZATION;  Surgeon: Coralie Keens, MD;  Location: [place];  Service: General;  Laterality: Right;  . COLONOSCOPY  09/05/2013  . D & C and hysteroscopy  11/1998   for uterine polyp  . DILATATION & CURRETTAGE/HYSTEROSCOPY WITH RESECTOCOPE N/A 05/30/2015   Procedure: DILATATION & CURETTAGE/HYSTEROSCOPY WITH RESECTOCOPE;  Surgeon: Princess Bruins, MD;  Location: [facility];  Service:  Gynecology;  Laterality: N/A;  . DILATION AND CURETTAGE OF UTERUS    . Sterotactix needle biopsy  2000   breast calcification    Current Outpatient Medications  Medication Sig Dispense Refill  . acetaminophen (TYLENOL) 325 MG tablet Take 325 mg by mouth every 6 (six) hours as needed for moderate pain or headache.    Marland Kitchen azithromycin (ZITHROMAX) 250 MG tablet Take 2 tablets on day one and take one tablet po on days 2-5 6 tablet 0  . Calcium Carbonate-Vitamin D (CALCIUM 600-D) 600-400 MG-UNIT per tablet Take 1 tablet by mouth daily.     . Cholecalciferol (VITAMIN D3) 1000 UNITS CAPS Take 1,000 Units by mouth daily.     Marland Kitchen ELIQUIS 5 MG TABS tablet TAKE 1 TABLET BY MOUTH TWICE DAILY. 60 tablet 0  . metoprolol succinate (TOPROL-XL) 25 MG 24 hr tablet Take 1 tablet (25 mg total) by mouth daily. 90 tablet 3  . Multiple Vitamin (MULTIVITAMIN) tablet Take 1 tablet by mouth daily.      Vladimir Faster Glycol-Propyl Glycol (SYSTANE OP) Place 1 drop into both eyes daily as needed (For dryness.).     Marland Kitchen Probiotic Product (FORTIFY DAILY PROBIOTIC PO) Take 1 capsule by mouth daily.     Marland Kitchen trimethoprim (TRIMPEX) 100 MG tablet Take 100 mg by mouth at bedtime.      No current facility-administered medications for this encounter.    No Known Allergies  Social History   Socioeconomic History  . Marital status: Married    Spouse name: howard  . Number of children: 2  . Years of education: 77  .  Highest education level: Not on file  Occupational History  . Occupation: PROPERTY MGR    Employer: [employer]  Tobacco Use  . Smoking status: Never Smoker  . Smokeless tobacco: Never Used  Vaping Use  . Vaping Use: Never used  Substance and Sexual Activity  . Alcohol use: Yes    Alcohol/week: 5.0 standard drinks    Types: 5 Standard drinks or equivalent per week    Comment: 2 beers/week  . Drug use: No  . Sexual activity: Yes    Partners: Male    Comment: 1st intercourse- 2, partners-   married-  21 yrs   Other Topics Concern  . Not on file  Social History Narrative   HSG, [place], Married '78,  1 son '81, 1 daughter '84, 1 grandson '12.    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 ACP - discussed with patient.    Refer - [email or web address]   Highest level of education:  BS   Right handed   Two story home   Social Determinants of Health   Financial Resource Strain: Not on file  Food Insecurity: Not on file  Transportation Needs: Not on file  Physical Activity: Not on file  Stress: Not on file  Social Connections: Not on file  Intimate Partner Violence: Not on file    Family History  Problem Relation Age of Onset  . Hypothyroidism Mother   . GER disease Mother   . Osteoporosis Mother   . Hypertension Mother   . Cancer Mother        hodgkins lympoma, uterine cancer  . Arrhythmia Mother   . Hyperlipidemia Father   . Hypertension Father   . Diabetes Father   . Coronary artery disease Father   . Coronary artery disease Maternal Grandfather   . COPD Neg Hx   . Asthma Neg Hx   . Colon cancer Neg Hx   . Esophageal cancer Neg Hx   . Rectal cancer Neg Hx   . Stomach cancer Neg Hx     ROS- All systems are reviewed and negative except as per the HPI above  Physical Exam: There were no vitals filed for this visit. Wt Readings from Last 3 Encounters:  05/29/20 68.5 kg  05/25/20 68 kg  05/01/20 68.5 kg    Labs: Lab Results  Component Value Date   NA 140 05/01/2020   K 5.2 05/01/2020   CL 102 05/01/2020   CO2 27 05/01/2020   GLUCOSE 92 05/01/2020   BUN 15 05/01/2020   CREATININE 1.00 05/01/2020   CALCIUM 10.0 05/01/2020   No results found for: INR Lab Results  Component Value Date   CHOL 199 05/09/2019   HDL 77.50 05/09/2019   LDLCALC 97 05/09/2019   TRIG 123.0 05/09/2019     GEN- The patient is well appearing, alert and oriented x 3 today.   Head- normocephalic, atraumatic Eyes-  Sclera clear,  conjunctiva pink Ears- hearing intact Oropharynx- clear Neck- supple, no JVP Lymph- no cervical lymphadenopathy Lungs- Clear to ausculation bilaterally, normal work of breathing Heart- Regular rate and rhythm, no murmurs, rubs or gallops, PMI not laterally displaced GI- soft, NT, ND, + BS Extremities- no clubbing, cyanosis, or edema MS- no significant deformity or atrophy Skin- no rash or lesion Psych- euthymic mood, full affect Neuro- strength and sensation are intact  EKG- NSR normal ekg, pr int 132 ms, qrs int 86 ms, qtc 416 ms  Assessment and Plan: 1. Paroxysmal  afib S/p ablation 11/26 In SR today Still noticing some intermittent afib but short lived and not symptomatic with it I discussed extra rate control meds  but she does not think she needs additional meds at this time   Continue  toprol 25 mg daily   2. CHA2DS2VASc score of 2 Continue eliquis 5 mg bid without any interruption for the remainder of the healing period of 2 months  F/u with Dr. Curt Bears 08/30/20  Geroge Baseman. Venida Tsukamoto, [facility] [address] [place], [place] [address] [phone]

## 2020-06-27 ENCOUNTER — Other Ambulatory Visit: Payer: Self-pay | Admitting: Internal Medicine

## 2020-07-02 ENCOUNTER — Telehealth: Payer: Self-pay | Admitting: Internal Medicine

## 2020-07-02 ENCOUNTER — Ambulatory Visit: Payer: Medicare Other | Admitting: Internal Medicine

## 2020-07-02 DIAGNOSIS — M542 Cervicalgia: Secondary | ICD-10-CM

## 2020-07-02 NOTE — Telephone Encounter (Signed)
Patient wondering if she can get a referral to PT instead of coming in for her back and neck pain, states she had this problem about two years ago and that's what she did then for the problem. Patient has appt 01.10.21 [phone]

## 2020-07-03 NOTE — Telephone Encounter (Signed)
Referral for PT placed

## 2020-07-03 NOTE — Telephone Encounter (Signed)
Left detailed message informing pt of below.  

## 2020-07-04 ENCOUNTER — Telehealth (HOSPITAL_COMMUNITY): Payer: Self-pay | Admitting: *Deleted

## 2020-07-04 MED ORDER — METOPROLOL SUCCINATE ER 25 MG PO TB24: ORAL_TABLET | ORAL | 3 refills | Status: DC

## 2020-07-04 NOTE — Telephone Encounter (Signed)
Patient called in stating she continues to have breakthrough afib with HRs in the 130s not sure how long they last but doesn't feel its more than 24 hours at a time.  At recent visit with Lupita Leash recommended increasing metoprolol but pt declined. Pt now would like to increase. Per Jorja Loa PA will try adding 12.5mg  in the evening. Call on Monday with update. Pt in agreement.

## 2020-07-09 ENCOUNTER — Ambulatory Visit: Payer: Medicare Other | Admitting: Internal Medicine

## 2020-07-16 ENCOUNTER — Ambulatory Visit: Payer: Medicare Other | Admitting: Physical Therapy

## 2020-07-17 ENCOUNTER — Encounter: Payer: Self-pay | Admitting: Physical Therapy

## 2020-07-17 ENCOUNTER — Ambulatory Visit: Payer: Medicare Other | Attending: Internal Medicine | Admitting: Physical Therapy

## 2020-07-17 ENCOUNTER — Other Ambulatory Visit: Payer: Self-pay

## 2020-07-17 DIAGNOSIS — M542 Cervicalgia: Secondary | ICD-10-CM | POA: Insufficient documentation

## 2020-07-17 DIAGNOSIS — R293 Abnormal posture: Secondary | ICD-10-CM | POA: Diagnosis present

## 2020-07-17 MED ORDER — METOPROLOL SUCCINATE ER 25 MG PO TB24: 25.0000 mg | ORAL_TABLET | Freq: Two times a day (BID) | ORAL | 3 refills | Status: DC

## 2020-07-17 NOTE — Addendum Note (Signed)
Addended by: Juluis Mire on: 07/17/2020 04:12 PM   Modules accepted: Orders

## 2020-07-17 NOTE — Patient Instructions (Addendum)
Trigger Point Dry Needling  . What is Trigger Point Dry Needling (DN)? o DN is a physical therapy technique used to treat muscle pain and dysfunction. Specifically, DN helps deactivate muscle trigger points (muscle knots).  o A thin filiform needle is used to penetrate the skin and stimulate the underlying trigger point. The goal is for a local twitch response (LTR) to occur and for the trigger point to relax. No medication of any kind is injected during the procedure.   . What Does Trigger Point Dry Needling Feel Like?  o The procedure feels different for each individual patient. Some patients report that they do not actually feel the needle enter the skin and overall the process is not painful. Very mild bleeding may occur. However, many patients feel a deep cramping in the muscle in which the needle was inserted. This is the local twitch response.   Marland Kitchen How Will I feel after the treatment? o Soreness is normal, and the onset of soreness may not occur for a few hours. Typically this soreness does not last longer than two days.  o Bruising is uncommon, however; ice can be used to decrease any possible bruising.  o In rare cases feeling tired or nauseous after the treatment is normal. In addition, your symptoms may get worse before they get better, this period will typically not last longer than 24 hours.   . What Can I do After My Treatment? o Increase your hydration by drinking more water for the next 24 hours. o You may place ice or heat on the areas treated that have become sore, however, do not use heat on inflamed or bruised areas. Heat often brings more relief post needling. o You can continue your regular activities, but vigorous activity is not recommended initially after the treatment for 24 hours. o DN is best combined with other physical therapy such as strengthening, stretching, and other therapies.    [facility] [address] [place], [place]  [address] Phone # [phone] Fax [phone] Access Code: [email or web address] URL: [email or web address] Date: 07/17/2020 Prepared by: Jari Favre  Exercises Shoulder W - External Rotation with Resistance - 1 x daily - 7 x weekly - 3 sets - 10 reps Seated Shoulder Diagonal Pulls with Resistance - 1 x daily - 7 x weekly - 3 sets - 10 reps Seated Shoulder Horizontal Abduction with Resistance - 1 x daily - 7 x weekly - 3 sets - 10 reps Seated Cervical Sidebending Stretch - 1 x daily - 7 x weekly - 3 sets - 10 reps Seated Cervical Rotation AROM - 3 x daily - 7 x weekly - 1 sets - 10 reps - 5 hold

## 2020-07-17 NOTE — Addendum Note (Signed)
Addended by: Su Hoff on: 07/17/2020 03:44 PM   Modules accepted: Orders

## 2020-07-17 NOTE — Telephone Encounter (Signed)
Pt states she has noticed no difference since increasing metoprolol to extra 1/2 tab at bedtime. Per Roderic Palau NP will try increasing metoprolol to 25mg  BID. Pt to call with update in 1 week.

## 2020-07-17 NOTE — Therapy (Signed)
[facility] [address], [place], [place], [address] Phone: [phone]   Fax:  [phone]  Physical Therapy Evaluation  Patient Details  Name: Crystal [name] MRN: [id number] Date of Birth: 07/07/1950 Referring Provider (PT): Hoyt Koch, MD   Encounter Date: 07/17/2020   PT End of Session - 07/17/20 1420    Visit Number 1    Date for PT Re-Evaluation 09/11/20    Authorization Type medicare A/B    PT Start Time 1230    PT Stop Time 1320    PT Time Calculation (min) 50 min    Activity Tolerance Patient tolerated treatment well    Behavior During Therapy [facility] for tasks assessed/performed           Past Medical History:  Diagnosis Date  . BACK PAIN, LUMBAR   . Breast cancer, right breast   . FIBROADENOMA, BREAST 2006  . PARESTHESIA    toes  . PONV (postoperative nausea and vomiting)   . Radiation 11/23/14-12/25/14   right breast 42.72 gray, lumpectomy cavity boosted to 54.72 gray  . Wears glasses     Past Surgical History:  Procedure Laterality Date  . ATRIAL FIBRILLATION ABLATION N/A 05/25/2020   Procedure: ATRIAL FIBRILLATION ABLATION;  Surgeon: Constance Haw, MD;  Location: [place] [facility];  Service: Cardiovascular;  Laterality: N/A;  . atrophic vaginitis    . BREAST LUMPECTOMY WITH RADIOACTIVE SEED LOCALIZATION Right 10/18/2014   Procedure: BREAST LUMPECTOMY WITH RADIOACTIVE SEED LOCALIZATION;  Surgeon: Coralie Keens, MD;  Location: [place];  Service: General;  Laterality: Right;  . COLONOSCOPY  09/05/2013  . D & C and hysteroscopy  11/1998   for uterine polyp  . DILATATION & CURRETTAGE/HYSTEROSCOPY WITH RESECTOCOPE N/A 05/30/2015   Procedure: DILATATION & CURETTAGE/HYSTEROSCOPY WITH RESECTOCOPE;  Surgeon: Princess Bruins, MD;  Location: [facility];  Service: Gynecology;  Laterality: N/A;  . DILATION AND CURETTAGE OF UTERUS    . Sterotactix needle biopsy  2000    breast calcification    There were no vitals filed for this visit.    Subjective Assessment - 07/17/20 1234    Subjective Lt rotation, extension and flexion hurts.  My Lt shoulder is nagging.  At night and in the bed and on the sofa is the worst.  Occasionally it catches with certain movements.    Limitations Other (comment)    Patient Stated Goals sitting at night, rolling in bed, driving    Currently in Pain? Yes    Pain Score 6    can be 1/10 just sitting still   Pain Location Neck    Pain Orientation Left;Right    Pain Descriptors / Indicators Sharp;Nagging;Aching;Dull;Discomfort    Pain Type Chronic pain    Pain Radiating Towards into the Lt shoulder    Pain Onset More than a month ago    Pain Frequency Intermittent    Aggravating Factors  at night is the worst time; certain movements    Pain Relieving Factors dry needling helped last time    Effect of Pain on Daily Activities turning in bed at night and looking when driving    Multiple Pain Sites No              [facility] PT Assessment - 07/17/20 0001      Assessment   Medical Diagnosis M54.2 (ICD-10-CM) - Neck pain    Referring Provider (PT) Hoyt Koch, MD    Onset Date/Surgical Date --  December got worse   Prior Therapy 2 years ago similar/same issue - DN helped a lot      Precautions   Precautions None      Balance Screen   Has the patient fallen in the past 6 months Yes    How many times? 1   was after the neck pain so wasn't causing problem; tripped on sticks   Has the patient had a decrease in activity level because of a fear of falling?  No    Is the patient reluctant to leave their home because of a fear of falling?  No      Home Ecologist residence    Living Arrangements Alone      Prior Function   Level of Independence Independent    Vocation Full time employment    Vocation Requirements sitting and standing      Cognition   Overall Cognitive Status Within  Functional Limits for tasks assessed      Observation/Other Assessments   Focus on Therapeutic Outcomes (FOTO)  55% functional   goal 66%     Posture/Postural Control   Posture/Postural Control Postural limitations    Postural Limitations Rounded Shoulders;Forward head;Increased thoracic kyphosis      ROM / Strength   AROM / PROM / Strength AROM;PROM;Strength      AROM   Overall AROM Comments shoulder bil WNL    AROM Assessment Site Cervical    Cervical Flexion 36   pain   Cervical Extension 65    Cervical - Right Side Bend 20    Cervical - Left Side Bend 20    Cervical - Right Rotation 45    Cervical - Left Rotation 35      PROM   Overall PROM Comments about 50% limited rotation improved by at least 10% after manual treatment      Strength   Overall Strength Comments bil shoulders 5/5      Palpation   Palpation comment upper trap tight Lt>Rt; C2-3 lateral shift to the Lt; T1 rotation to the Rt; Lt suboccipitals tight, cervical paraspinals tight bil; Rt SCM tight      Special Tests   Other special tests neer and Hawkinds kennedy negative; no tenderness to RTC insertion points - not likely pain is coming from the [facility] joint or associated tissues      Ambulation/Gait   Gait Pattern Within Functional Limits                      Objective measurements completed on examination: See above findings.       [facility] Adult PT Treatment/Exercise - 07/17/20 0001      Self-Care   Self-Care Other Self-Care Comments    Other Self-Care Comments  intial HEP given      Manual Therapy   Manual Therapy Soft tissue mobilization    Soft tissue mobilization skilled Palpation and fascial release to cervical paraspinals and suboccipitals            Trigger Point Dry Needling - 07/17/20 0001    Consent Given? Yes    Education Handout Provided Yes    Muscles Treated Head and Neck Cervical multifidi;Upper trapezius    Upper Trapezius Response Twitch reponse elicited;Palpable  increased muscle length   bil   Cervical multifidi Response Twitch reponse elicited;Palpable increased muscle length   bil               PT  Education - 07/17/20 1438    Education Details Access Code: [email or web address] - with yellow and red band provided    Person(s) Educated Patient    Methods Explanation;Demonstration;Handout;Verbal cues;Tactile cues    Comprehension Verbalized understanding;Returned demonstration            PT Short Term Goals - 07/17/20 1446      PT SHORT TERM GOAL #1   Title Ind with initial HEP    Time 4    Period Weeks    Status New    Target Date 08/14/20      PT SHORT TERM GOAL #2   Title The patient will report a 30% improvement in lower cervical, upper back pain level with ADLs    Time 4    Period Weeks    Status New    Target Date 08/14/20      PT SHORT TERM GOAL #3   Title Patient will have improved symmetrical cervical sidebending and rotation    Time 4    Period Weeks    Status New    Target Date 09/11/20             PT Long Term Goals - 07/17/20 1443      PT LONG TERM GOAL #1   Title The patient will be independent with a safe self progression of HEP    Time 8    Period Weeks    Status New    Target Date 09/11/20      PT LONG TERM GOAL #2   Title The patient will report a 70% improvement in pain across lower cervical/upper back region with home and work ADLs    Time 8    Period Weeks    Status New    Target Date 09/11/20      PT LONG TERM GOAL #3   Title Pt will be able to demonstrate imporved upright posture due to greater strength and endurance of periscapular and back extensor strength    Time 8    Period Weeks    Status New    Target Date 09/11/20      PT LONG TERM GOAL #4   Title FOTO functional outcome score improved from 55% functional to 66%    Time 8    Period Weeks    Status New    Target Date 09/11/20                  Plan - 07/17/20 1448    Clinical Impression Statement Pt presents to skilled  PT and is familiar to our clinic from previous episode of cervicalgia. Pt responded well to dry needling treatments in the past and based on today's assessment and treatment appears to be a similar issue and favorable response to dry needling.  Pt demonstrates posture deficits significant for thoracic kyphosis and rounded shoulders.  Pt has muscle spasms as mentioned above with decreased cervical ROM.  T1 and C2-3 seem to be the regions with greatest hypomobility and abnormal alignment.  Pt experienced release and improved cervical sidebending after treatment today.  Pt will benefit from skilled PT to address impairments and regain maximum function for ADLs without being impeded by pain and decreased motion.    Personal Factors and Comorbidities Age;Time since onset of injury/illness/exacerbation;Comorbidity 1;Past/Current Experience    Comorbidities osteopenia    Examination-Activity Limitations Lift;Sleep    Examination-Participation Restrictions Driving    Stability/Clinical Decision Making Evolving/Moderate complexity    Clinical Decision Making Moderate  Rehab Potential Excellent    PT Frequency 1x / week    PT Duration 8 weeks    PT Treatment/Interventions ADLs/Self Care Home Management;Biofeedback;Cryotherapy;Electrical Stimulation;Moist Heat;Traction;Neuromuscular re-education;Therapeutic activities;Therapeutic exercise;Patient/family education;Manual techniques;Dry needling;Passive range of motion;Taping    PT Next Visit Plan f/u on DN #1 to upper traps and cervical multifidi; f/u on HEP, thoracic ext and rotation ROM    PT Home Exercise Plan Access Code: [email or web address]    Consulted and Agree with Plan of Care Patient           Patient will benefit from skilled therapeutic intervention in order to improve the following deficits and impairments:  Pain,Hypomobility,Decreased strength,Postural dysfunction,Decreased range of motion,Increased muscle spasms  Visit  Diagnosis: Cervicalgia  Abnormal posture     Problem List Patient Active Problem List   Diagnosis Date Noted  . Palpitations 01/06/2020  . New onset atrial fibrillation ([place]) 01/06/2020  . Spider veins of both lower extremities 05/09/2019  . Breast cancer of lower-outer quadrant of right female breast ([place]) 10/24/2014  . Routine health maintenance 03/30/2013  . PARESTHESIA 06/25/2010    Jule Ser, PT 07/17/2020, 3:42 PM  [facility] [address] [place], [place], [address] Phone: [phone]   Fax:  [phone]  Name: KEANA DUEITT MRN: [id number] Date of Birth: 1951/01/27

## 2020-07-26 ENCOUNTER — Ambulatory Visit: Payer: Medicare Other | Admitting: Physical Therapy

## 2020-07-26 ENCOUNTER — Other Ambulatory Visit: Payer: Self-pay

## 2020-07-26 DIAGNOSIS — R293 Abnormal posture: Secondary | ICD-10-CM

## 2020-07-26 DIAGNOSIS — M542 Cervicalgia: Secondary | ICD-10-CM | POA: Diagnosis not present

## 2020-07-26 NOTE — Therapy (Signed)
[facility] [address] [place], [place], [address] Phone: [phone]   Fax:  [phone]  Physical Therapy Treatment  Patient Details  Name: Crystal [name] MRN: [id number] Date of Birth: Oct 13, 1950 Referring Provider (PT): Hoyt Koch, MD   Encounter Date: 07/26/2020   PT End of Session - 07/26/20 1711    Visit Number 2    Date for PT Re-Evaluation 09/11/20    Authorization Type medicare A/B    PT Start Time 7782    PT Stop Time 1700    PT Time Calculation (min) 45 min           Past Medical History:  Diagnosis Date  . BACK PAIN, LUMBAR   . Breast cancer, right breast   . FIBROADENOMA, BREAST 2006  . PARESTHESIA    toes  . PONV (postoperative nausea and vomiting)   . Radiation 11/23/14-12/25/14   right breast 42.72 gray, lumpectomy cavity boosted to 54.72 gray  . Wears glasses     Past Surgical History:  Procedure Laterality Date  . ATRIAL FIBRILLATION ABLATION N/A 05/25/2020   Procedure: ATRIAL FIBRILLATION ABLATION;  Surgeon: Constance Haw, MD;  Location: [place] [facility];  Service: Cardiovascular;  Laterality: N/A;  . atrophic vaginitis    . BREAST LUMPECTOMY WITH RADIOACTIVE SEED LOCALIZATION Right 10/18/2014   Procedure: BREAST LUMPECTOMY WITH RADIOACTIVE SEED LOCALIZATION;  Surgeon: Coralie Keens, MD;  Location: [facility];  Service: General;  Laterality: Right;  . COLONOSCOPY  09/05/2013  . D & C and hysteroscopy  11/1998   for uterine polyp  . DILATATION & CURRETTAGE/HYSTEROSCOPY WITH RESECTOCOPE N/A 05/30/2015   Procedure: DILATATION & CURETTAGE/HYSTEROSCOPY WITH RESECTOCOPE;  Surgeon: Princess Bruins, MD;  Location: [facility];  Service: Gynecology;  Laterality: N/A;  . DILATION AND CURETTAGE OF UTERUS    . Sterotactix needle biopsy  2000   breast calcification    There were no vitals filed for this visit.   Subjective Assessment - 07/26/20 1620     Subjective In Dec, bil upper trap pain worsened.  I'm good today.  Going to [place] on Feb 6th.  Hard to turn in bed.  Difficulty turning head for driving.  Sore after Dn last time.  Doing well with yellow and red band ex's from last time.    Patient Stated Goals sitting at night, rolling in bed, driving    Currently in Pain? Yes    Pain Score 4     Pain Location Neck    Pain Orientation Right;Left    Pain Type Chronic pain    Pain Radiating Towards bil upper trap regions                             [facility] Adult PT Treatment/Exercise - 07/26/20 0001      Exercises   Exercises --   verbal review of initial HEP     Neck Exercises: Seated   Other Seated Exercise thoracic extension with hands behind neck and ball at mid back 15x    Other Seated Exercise cervical SNAG rotation with towel 10x right/left      Moist Heat Therapy   Number Minutes Moist Heat 3 Minutes    Moist Heat Location Cervical      Manual Therapy   Soft tissue mobilization bil cervical paraspinals and bil uppper traps, levator scap  Trigger Point Dry Needling - 07/26/20 0001    Consent Given? Yes    Muscles Treated Head and Neck Levator scapulae    Other Dry Needling bil    Upper Trapezius Response Twitch reponse elicited;Palpable increased muscle length   bil   Levator Scapulae Response Palpable increased muscle length    Cervical multifidi Response Twitch reponse elicited;Palpable increased muscle length   bil               PT Education - 07/26/20 1654    Education Details cervical SNAG rotation; seated thoracic extension with ball    Person(s) Educated Patient    Methods Explanation;Demonstration;Handout    Comprehension Returned demonstration;Verbalized understanding            PT Short Term Goals - 07/17/20 1446      PT SHORT TERM GOAL #1   Title Ind with initial HEP    Time 4    Period Weeks    Status New    Target Date 08/14/20      PT SHORT TERM GOAL #2    Title The patient will report a 30% improvement in lower cervical, upper back pain level with ADLs    Time 4    Period Weeks    Status New    Target Date 08/14/20      PT SHORT TERM GOAL #3   Title Patient will have improved symmetrical cervical sidebending and rotation    Time 4    Period Weeks    Status New    Target Date 09/11/20             PT Long Term Goals - 07/17/20 1443      PT LONG TERM GOAL #1   Title The patient will be independent with a safe self progression of HEP    Time 8    Period Weeks    Status New    Target Date 09/11/20      PT LONG TERM GOAL #2   Title The patient will report a 70% improvement in pain across lower cervical/upper back region with home and work ADLs    Time 8    Period Weeks    Status New    Target Date 09/11/20      PT LONG TERM GOAL #3   Title Pt will be able to demonstrate imporved upright posture due to greater strength and endurance of periscapular and back extensor strength    Time 8    Period Weeks    Status New    Target Date 09/11/20      PT LONG TERM GOAL #4   Title FOTO functional outcome score improved from 55% functional to 66%    Time 8    Period Weeks    Status New    Target Date 09/11/20                 Plan - 07/26/20 1655    Clinical Impression Statement The patient has right > left tender points which respond very well to DN and manual therapy soft tissue mobilization.  She is highly compliant with her initial HEP.  She is instructed in thoracic extension to reduce kyphosis and improve posture as well as cervical rotation self mob to aid driving.  Therapist monitoring response with all treatment interventions.    Personal Factors and Comorbidities Age;Time since onset of injury/illness/exacerbation;Comorbidity 1;Past/Current Experience    Examination-Participation Restrictions Driving    Rehab Potential Excellent  PT Frequency 1x / week    PT Duration 8 weeks    PT Treatment/Interventions  ADLs/Self Care Home Management;Biofeedback;Cryotherapy;Electrical Stimulation;Moist Heat;Traction;Neuromuscular re-education;Therapeutic activities;Therapeutic exercise;Patient/family education;Manual techniques;Dry needling;Passive range of motion;Taping    PT Next Visit Plan assess response to DN #2;  add open books with yellow or red band to help with rolling in bed;  review SNAGS and thoracic extension as needed;  upcoming [place] trip    PT Home Exercise Plan Access Code: [facility]           Patient will benefit from skilled therapeutic intervention in order to improve the following deficits and impairments:  Pain,Hypomobility,Decreased strength,Postural dysfunction,Decreased range of motion,Increased muscle spasms  Visit Diagnosis: Cervicalgia  Abnormal posture     Problem List Patient Active Problem List   Diagnosis Date Noted  . Palpitations 01/06/2020  . New onset atrial fibrillation ([place]) 01/06/2020  . Spider veins of both lower extremities 05/09/2019  . Breast cancer of lower-outer quadrant of right female breast ([place]) 10/24/2014  . Routine health maintenance 03/30/2013  . PARESTHESIA 06/25/2010   Ruben Im, PT 07/26/20 5:16 PM Phone: [phone] Fax: [phone] Alvera Singh 07/26/2020, 5:16 PM  [facility] [address], [place], [place], [address] Phone: [phone]   Fax:  [phone]  Name: JACQUELIN KRAJEWSKI MRN: [id number] Date of Birth: January 14, 1951

## 2020-07-26 NOTE — Patient Instructions (Signed)
Access Code: [email or web address] URL: [email or web address] Date: 07/26/2020 Prepared by: Ruben Im  Exercises Shoulder W - External Rotation with Resistance - 1 x daily - 7 x weekly - 3 sets - 10 reps Seated Shoulder Diagonal Pulls with Resistance - 1 x daily - 7 x weekly - 3 sets - 10 reps Seated Shoulder Horizontal Abduction with Resistance - 1 x daily - 7 x weekly - 3 sets - 10 reps Seated Cervical Sidebending Stretch - 1 x daily - 7 x weekly - 3 sets - 10 reps Seated Cervical Rotation AROM - 3 x daily - 7 x weekly - 1 sets - 10 reps - 5 hold Seated Assisted Cervical Rotation with Towel - 1 x daily - 7 x weekly - 1 sets - 10 reps Seated Thoracic Lumbar Extension with Pectoralis Stretch - 1 x daily - 7 x weekly - 1 sets - 10 reps

## 2020-07-30 ENCOUNTER — Other Ambulatory Visit: Payer: Self-pay | Admitting: Internal Medicine

## 2020-08-03 ENCOUNTER — Other Ambulatory Visit: Payer: Self-pay

## 2020-08-03 ENCOUNTER — Ambulatory Visit: Payer: Medicare Other | Attending: Internal Medicine | Admitting: Physical Therapy

## 2020-08-03 DIAGNOSIS — M542 Cervicalgia: Secondary | ICD-10-CM | POA: Diagnosis present

## 2020-08-03 DIAGNOSIS — R293 Abnormal posture: Secondary | ICD-10-CM | POA: Diagnosis present

## 2020-08-03 MED ORDER — METOPROLOL SUCCINATE ER 25 MG PO TB24
25.0000 mg | ORAL_TABLET | Freq: Two times a day (BID) | ORAL | 3 refills | Status: DC
Start: 2020-08-03 — End: 2021-01-04

## 2020-08-03 NOTE — Patient Instructions (Signed)
Access Code: [email or web address] URL: [email or web address] Date: 08/03/2020 Prepared by: Ruben Im  Exercises Shoulder W - External Rotation with Resistance - 1 x daily - 7 x weekly - 3 sets - 10 reps Seated Shoulder Diagonal Pulls with Resistance - 1 x daily - 7 x weekly - 3 sets - 10 reps Seated Shoulder Horizontal Abduction with Resistance - 1 x daily - 7 x weekly - 3 sets - 10 reps Seated Cervical Sidebending Stretch - 1 x daily - 7 x weekly - 3 sets - 10 reps Seated Cervical Rotation AROM - 3 x daily - 7 x weekly - 1 sets - 10 reps - 5 hold Seated Assisted Cervical Rotation with Towel - 1 x daily - 7 x weekly - 1 sets - 10 reps Seated Thoracic Lumbar Extension with Pectoralis Stretch - 1 x daily - 7 x weekly - 1 sets - 10 reps Supine Chest Stretch on Foam Roll - 1 x daily - 7 x weekly - 1 sets - 10 reps Supine Thoracic Mobilization Foam Roll Horizontal with Arm Stretch - 1 x daily - 7 x weekly - 1 sets - 10 reps

## 2020-08-03 NOTE — Telephone Encounter (Signed)
Patient called back in to report she generally feels little to no afib. She has had a few days since increasing metoprolol when she was in AF but it eventually converted to normal rhythm.  She will continue to monitor things -if Af burden increases will call back but otherwise will plan to see Dr. Curt Bears in March as scheduled.

## 2020-08-03 NOTE — Addendum Note (Signed)
Addended by: Juluis Mire on: 08/03/2020 02:48 PM   Modules accepted: Orders

## 2020-08-03 NOTE — Therapy (Signed)
[facility] [address] [place], [place], [address] Phone: [phone]   Fax:  [phone]  Physical Therapy Treatment  Patient Details  Name: Crystal [name] MRN: [id number] Date of Birth: [birth year] Referring Provider (PT): Hoyt Koch, MD   Encounter Date: 08/03/2020   PT End of Session - 08/03/20 1152    Visit Number 3    Date for PT Re-Evaluation 09/11/20    Authorization Type medicare A/B    PT Start Time 1105    PT Stop Time 1149    PT Time Calculation (min) 44 min    Activity Tolerance Patient tolerated treatment well           Past Medical History:  Diagnosis Date  . BACK PAIN, LUMBAR   . Breast cancer, right breast   . FIBROADENOMA, BREAST 2006  . PARESTHESIA    toes  . PONV (postoperative nausea and vomiting)   . Radiation 11/23/14-12/25/14   right breast 42.72 gray, lumpectomy cavity boosted to 54.72 gray  . Wears glasses     Past Surgical History:  Procedure Laterality Date  . ATRIAL FIBRILLATION ABLATION N/A 05/25/2020   Procedure: ATRIAL FIBRILLATION ABLATION;  Surgeon: Constance Haw, MD;  Location: [facility];  Service: Cardiovascular;  Laterality: N/A;  . atrophic vaginitis    . BREAST LUMPECTOMY WITH RADIOACTIVE SEED LOCALIZATION Right 10/18/2014   Procedure: BREAST LUMPECTOMY WITH RADIOACTIVE SEED LOCALIZATION;  Surgeon: Coralie Keens, MD;  Location: [facility];  Service: General;  Laterality: Right;  . COLONOSCOPY  09/05/2013  . D & C and hysteroscopy  11/1998   for uterine polyp  . DILATATION & CURRETTAGE/HYSTEROSCOPY WITH RESECTOCOPE N/A 05/30/2015   Procedure: DILATATION & CURETTAGE/HYSTEROSCOPY WITH RESECTOCOPE;  Surgeon: Princess Bruins, MD;  Location: [facility];  Service: Gynecology;  Laterality: N/A;  . DILATION AND CURETTAGE OF UTERUS    . Sterotactix needle biopsy  2000   breast calcification    There were no vitals filed for this  visit.   Subjective Assessment - 08/03/20 1105    Subjective A little sore from DN last time.    Patient Stated Goals sitting at night, rolling in bed, driving    Currently in Pain? Yes    Pain Score 3     Pain Location Neck    Pain Orientation Right;Left    Pain Type Chronic pain                             [facility] Adult PT Treatment/Exercise - 08/03/20 0001      Neck Exercises: Supine   Other Supine Exercise supine vertical pool noodle with arms in T and overhead    Other Supine Exercise thoracic extension over pool noodle      Moist Heat Therapy   Moist Heat Location Cervical      Manual Therapy   Soft tissue mobilization bil cervical paraspinals and bil uppper traps, levator scap, rhomboids            Trigger Point Dry Needling - 08/03/20 0001    Consent Given? Yes    Muscles Treated Head and Neck Levator scapulae    Muscles Treated Upper Quadrant Rhomboids    Other Dry Needling bil    Upper Trapezius Response Twitch reponse elicited;Palpable increased muscle length   bil   Levator Scapulae Response Palpable increased muscle length    Cervical multifidi Response Twitch reponse  elicited;Palpable increased muscle length   bil   Rhomboids Response Palpable increased muscle length                PT Education - 08/03/20 1151    Education Details pool noodle thoracic extension ex in supine    Person(s) Educated Patient    Methods Explanation;Handout;Demonstration    Comprehension Returned demonstration;Verbalized understanding            PT Short Term Goals - 07/17/20 1446      PT SHORT TERM GOAL #1   Title Ind with initial HEP    Time 4    Period Weeks    Status New    Target Date 08/14/20      PT SHORT TERM GOAL #2   Title The patient will report a 30% improvement in lower cervical, upper back pain level with ADLs    Time 4    Period Weeks    Status New    Target Date 08/14/20      PT SHORT TERM GOAL #3   Title Patient will  have improved symmetrical cervical sidebending and rotation    Time 4    Period Weeks    Status New    Target Date 09/11/20             PT Long Term Goals - 07/17/20 1443      PT LONG TERM GOAL #1   Title The patient will be independent with a safe self progression of HEP    Time 8    Period Weeks    Status New    Target Date 09/11/20      PT LONG TERM GOAL #2   Title The patient will report a 70% improvement in pain across lower cervical/upper back region with home and work ADLs    Time 8    Period Weeks    Status New    Target Date 09/11/20      PT LONG TERM GOAL #3   Title Pt will be able to demonstrate imporved upright posture due to greater strength and endurance of periscapular and back extensor strength    Time 8    Period Weeks    Status New    Target Date 09/11/20      PT LONG TERM GOAL #4   Title FOTO functional outcome score improved from 55% functional to 66%    Time 8    Period Weeks    Status New    Target Date 09/11/20                 Plan - 08/03/20 1141    Clinical Impression Statement The patient reports decreasing lower neck/upper back pain overall but still present with turning her head, at the end of the day and after her weekly pottery session last night.  Limited additions to HEP since she is traveling to [place] with her family.  Good response to DN with much improved soft tissue mobility and decreased tender point size and number.    Personal Factors and Comorbidities Age;Time since onset of injury/illness/exacerbation;Comorbidity 1;Past/Current Experience    Comorbidities osteopenia    Stability/Clinical Decision Making Evolving/Moderate complexity    Rehab Potential Excellent    PT Frequency 1x / week    PT Duration 8 weeks    PT Treatment/Interventions ADLs/Self Care Home Management;Biofeedback;Cryotherapy;Electrical Stimulation;Moist Heat;Traction;Neuromuscular re-education;Therapeutic activities;Therapeutic exercise;Patient/family  education;Manual techniques;Dry needling;Passive range of motion;Taping    PT Next Visit Plan assess response to DN #  3;  review SNAGS and thoracic extension on pool noodle;  upcoming [place] trip    PT Home Exercise Plan Access Code: [email or web address]           Patient will benefit from skilled therapeutic intervention in order to improve the following deficits and impairments:  Pain,Hypomobility,Decreased strength,Postural dysfunction,Decreased range of motion,Increased muscle spasms  Visit Diagnosis: Cervicalgia  Abnormal posture     Problem List Patient Active Problem List   Diagnosis Date Noted  . Palpitations 01/06/2020  . New onset atrial fibrillation ([place]) 01/06/2020  . Spider veins of both lower extremities 05/09/2019  . Breast cancer of lower-outer quadrant of right female breast ([place]) 10/24/2014  . Routine health maintenance 03/30/2013  . PARESTHESIA 06/25/2010   Ruben Im, PT 08/03/20 11:57 AM Phone: [phone] Fax: [phone] Alvera Singh 08/03/2020, 11:57 AM  [facility] [address] [place], [place], [address] Phone: [phone]   Fax:  [phone]  Name: Crystal [name] MRN: [id number] Date of Birth: 12-13-1950

## 2020-08-14 ENCOUNTER — Ambulatory Visit: Payer: Medicare Other | Admitting: Physical Therapy

## 2020-08-14 ENCOUNTER — Other Ambulatory Visit: Payer: Self-pay

## 2020-08-14 DIAGNOSIS — M542 Cervicalgia: Secondary | ICD-10-CM | POA: Diagnosis not present

## 2020-08-14 DIAGNOSIS — R293 Abnormal posture: Secondary | ICD-10-CM

## 2020-08-14 NOTE — Therapy (Signed)
[facility] [address] [place], [place], [address] Phone: [phone]   Fax:  [phone]  Physical Therapy Treatment  Patient Details  Name: Crystal [name] MRN: [id number] Date of Birth: Jul 21, 1950 Referring Provider (PT): Hoyt Koch, MD   Encounter Date: 08/14/2020   PT End of Session - 08/14/20 0838    Visit Number 4    Date for PT Re-Evaluation 09/11/20    Authorization Type medicare A/B    PT Start Time 0800    PT Stop Time 0842    PT Time Calculation (min) 42 min    Activity Tolerance Patient tolerated treatment well           Past Medical History:  Diagnosis Date  . BACK PAIN, LUMBAR   . Breast cancer, right breast   . FIBROADENOMA, BREAST 2006  . PARESTHESIA    toes  . PONV (postoperative nausea and vomiting)   . Radiation 11/23/14-12/25/14   right breast 42.72 gray, lumpectomy cavity boosted to 54.72 gray  . Wears glasses     Past Surgical History:  Procedure Laterality Date  . ATRIAL FIBRILLATION ABLATION N/A 05/25/2020   Procedure: ATRIAL FIBRILLATION ABLATION;  Surgeon: Constance Haw, MD;  Location: [facility];  Service: Cardiovascular;  Laterality: N/A;  . atrophic vaginitis    . BREAST LUMPECTOMY WITH RADIOACTIVE SEED LOCALIZATION Right 10/18/2014   Procedure: BREAST LUMPECTOMY WITH RADIOACTIVE SEED LOCALIZATION;  Surgeon: Coralie Keens, MD;  Location: [facility];  Service: General;  Laterality: Right;  . COLONOSCOPY  09/05/2013  . D & C and hysteroscopy  11/1998   for uterine polyp  . DILATATION & CURRETTAGE/HYSTEROSCOPY WITH RESECTOCOPE N/A 05/30/2015   Procedure: DILATATION & CURETTAGE/HYSTEROSCOPY WITH RESECTOCOPE;  Surgeon: Princess Bruins, MD;  Location: [facility];  Service: Gynecology;  Laterality: N/A;  . DILATION AND CURETTAGE OF UTERUS    . Sterotactix needle biopsy  2000   breast calcification    There were no vitals filed for this  visit.   Subjective Assessment - 08/14/20 0759    Subjective Had a good but tiring trip to [place].   Every time I come I get better.  Right  Levator scap area is still naggy pain.    Patient Stated Goals sitting at night, rolling in bed, driving    Currently in Pain? Yes    Pain Score 2     Pain Location Neck    Pain Orientation Right    Pain Type Chronic pain                             [facility] Adult PT Treatment/Exercise - 08/14/20 0001      Shoulder Exercises: Standing   Flexion Strengthening;Both;5 reps    Shoulder Flexion Weight (lbs) 2    ABduction Both;5 reps    Shoulder ABduction Weight (lbs) 2    Other Standing Exercises biceps 2# 10x, with tempo 10x; overhead triceps extensions 10x 2#    Other Standing Exercises 2# snatch and press overhead 10x right/left      Moist Heat Therapy   Number Minutes Moist Heat 3 Minutes    Moist Heat Location Cervical      Manual Therapy   Soft tissue mobilization bil cervical paraspinals and bil uppper traps, levator scap, rhomboids            Trigger Point Dry Needling - 08/14/20 0001  Other Dry Needling right    Upper Trapezius Response Twitch reponse elicited;Palpable increased muscle length   bil   Levator Scapulae Response Palpable increased muscle length    Cervical multifidi Response Twitch reponse elicited;Palpable increased muscle length   bil   Rhomboids Response Palpable increased muscle length                  PT Short Term Goals - 07/17/20 1446      PT SHORT TERM GOAL #1   Title Ind with initial HEP    Time 4    Period Weeks    Status New    Target Date 08/14/20      PT SHORT TERM GOAL #2   Title The patient will report a 30% improvement in lower cervical, upper back pain level with ADLs    Time 4    Period Weeks    Status New    Target Date 08/14/20      PT SHORT TERM GOAL #3   Title Patient will have improved symmetrical cervical sidebending and rotation    Time 4    Period  Weeks    Status New    Target Date 09/11/20             PT Long Term Goals - 07/17/20 1443      PT LONG TERM GOAL #1   Title The patient will be independent with a safe self progression of HEP    Time 8    Period Weeks    Status New    Target Date 09/11/20      PT LONG TERM GOAL #2   Title The patient will report a 70% improvement in pain across lower cervical/upper back region with home and work ADLs    Time 8    Period Weeks    Status New    Target Date 09/11/20      PT LONG TERM GOAL #3   Title Pt will be able to demonstrate imporved upright posture due to greater strength and endurance of periscapular and back extensor strength    Time 8    Period Weeks    Status New    Target Date 09/11/20      PT LONG TERM GOAL #4   Title FOTO functional outcome score improved from 55% functional to 66%    Time 8    Period Weeks    Status New    Target Date 09/11/20                 Plan - 08/14/20 9373    Clinical Impression Statement The patient's primary complaint is right levator scap and rhomboid tightness and nagging discomfort.  Good response to Dn and manual therapy with decreased tender point size and number.   Encouraged resuming dumbell ex's she did on her previous course of PT and reviewed basic ex's that would be especially appropriate.  Therapist monitoring response with all treatment interventions.    Personal Factors and Comorbidities Age;Time since onset of injury/illness/exacerbation;Comorbidity 1;Past/Current Experience    Comorbidities osteopenia    Examination-Activity Limitations Lift;Sleep    Stability/Clinical Decision Making Evolving/Moderate complexity    Rehab Potential Excellent    PT Frequency 1x / week    PT Duration 8 weeks    PT Treatment/Interventions ADLs/Self Care Home Management;Biofeedback;Cryotherapy;Electrical Stimulation;Moist Heat;Traction;Neuromuscular re-education;Therapeutic activities;Therapeutic exercise;Patient/family  education;Manual techniques;Dry needling;Passive range of motion;Taping    PT Next Visit Plan assess response to DN #4  review SNAGS  and thoracic extension on pool noodle;  review resisted band ex's    PT Home Exercise Plan Access Code: [email or web address]           Patient will benefit from skilled therapeutic intervention in order to improve the following deficits and impairments:  Pain,Hypomobility,Decreased strength,Postural dysfunction,Decreased range of motion,Increased muscle spasms  Visit Diagnosis: Cervicalgia  Abnormal posture     Problem List Patient Active Problem List   Diagnosis Date Noted  . Palpitations 01/06/2020  . New onset atrial fibrillation ([place]) 01/06/2020  . Spider veins of both lower extremities 05/09/2019  . Breast cancer of lower-outer quadrant of right female breast ([place]) 10/24/2014  . Routine health maintenance 03/30/2013  . PARESTHESIA 06/25/2010   Ruben Im, PT 08/14/20 8:47 AM Phone: [phone] Fax: [phone] Alvera Singh 08/14/2020, 8:46 AM  [facility] [address], [place], [place], [address] Phone: [phone]   Fax:  [phone]  Name: AKEIBA AXELSON MRN: [id number] Date of Birth: 04/11/1951

## 2020-08-23 ENCOUNTER — Other Ambulatory Visit: Payer: Self-pay

## 2020-08-23 ENCOUNTER — Ambulatory Visit: Payer: Medicare Other | Admitting: Physical Therapy

## 2020-08-23 DIAGNOSIS — M542 Cervicalgia: Secondary | ICD-10-CM | POA: Diagnosis not present

## 2020-08-23 DIAGNOSIS — R293 Abnormal posture: Secondary | ICD-10-CM

## 2020-08-23 NOTE — Therapy (Signed)
[facility] [address], [place], [place], [address] Phone: [phone]   Fax:  [phone]  Physical Therapy Treatment  Patient Details  Name: Crystal [name] MRN: [id number] Date of Birth: 1951-06-17 Referring Provider (PT): Hoyt Koch, MD   Encounter Date: 08/23/2020   PT End of Session - 08/23/20 0836    Visit Number 5    Date for PT Re-Evaluation 09/11/20    Authorization Type medicare A/B    PT Start Time 0759    PT Stop Time 0841   DN and heat   PT Time Calculation (min) 42 min    Activity Tolerance Patient tolerated treatment well           Past Medical History:  Diagnosis Date  . BACK PAIN, LUMBAR   . Breast cancer of lower-outer quadrant of right female breast ([place]) 10/24/2014  . Breast cancer, right breast   . FIBROADENOMA, BREAST 2006  . New onset atrial fibrillation ([place]) 01/06/2020  . Palpitations 01/06/2020  . PARESTHESIA    toes  . PONV (postoperative nausea and vomiting)   . Radiation 11/23/14-12/25/14   right breast 42.72 gray, lumpectomy cavity boosted to 54.72 gray  . Routine health maintenance 03/30/2013   Colonoscopy - for colonoscopy (Sept '14)  Immunization  Tdap Oct '14   . Spider veins of both lower extremities 05/09/2019  . Wears glasses     Past Surgical History:  Procedure Laterality Date  . ATRIAL FIBRILLATION ABLATION N/A 05/25/2020   Procedure: ATRIAL FIBRILLATION ABLATION;  Surgeon: Constance Haw, MD;  Location: [facility];  Service: Cardiovascular;  Laterality: N/A;  . atrophic vaginitis    . BREAST LUMPECTOMY WITH RADIOACTIVE SEED LOCALIZATION Right 10/18/2014   Procedure: BREAST LUMPECTOMY WITH RADIOACTIVE SEED LOCALIZATION;  Surgeon: Coralie Keens, MD;  Location: [facility];  Service: General;  Laterality: Right;  . COLONOSCOPY  09/05/2013  . D & C and hysteroscopy  11/1998   for uterine polyp  . DILATATION &  CURRETTAGE/HYSTEROSCOPY WITH RESECTOCOPE N/A 05/30/2015   Procedure: DILATATION & CURETTAGE/HYSTEROSCOPY WITH RESECTOCOPE;  Surgeon: Princess Bruins, MD;  Location: [facility];  Service: Gynecology;  Laterality: N/A;  . DILATION AND CURETTAGE OF UTERUS    . Sterotactix needle biopsy  2000   breast calcification    There were no vitals filed for this visit.   Subjective Assessment - 08/23/20 0758    Subjective Feeling fine.  I was sore after last visit.  There are things still there but way better than it was.  Right posterior shoulder/scapular pain from where I fell.  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  Using 5# and 2# weights.    Patient Stated Goals sitting at night, rolling in bed, driving    Currently in Pain? Yes    Pain Score 2     Pain Location Scapula    Pain Orientation Right              [facility] PT Assessment - 08/23/20 0001      AROM   Cervical Flexion 48    Cervical - Right Side Bend 44    Cervical - Left Side Bend 38    Cervical - Right Rotation 48    Cervical - Left Rotation 52                         [facility] Adult PT Treatment/Exercise -  08/23/20 0001      Shoulder Exercises: Standing   Flexion Strengthening;Both;5 reps    Shoulder Flexion Weight (lbs) 2    ABduction Both;5 reps    Shoulder ABduction Weight (lbs) 2    Other Standing Exercises --    Other Standing Exercises review of dumbell upper quarter strenthening      Moist Heat Therapy   Number Minutes Moist Heat 3 Minutes    Moist Heat Location Cervical      Manual Therapy   Soft tissue mobilization bil cervical paraspinals and bil uppper traps, levator scap, rhomboids, periscapular muscles            Trigger Point Dry Needling - 08/23/20 0001    Muscles Treated Upper Quadrant Supraspinatus;Infraspinatus;Subscapularis    Other Dry Needling right    Upper Trapezius Response Twitch reponse elicited;Palpable increased muscle length   bil   Levator Scapulae  Response Palpable increased muscle length    Rhomboids Response Palpable increased muscle length    Supraspinatus Response Palpable increased muscle length    Infraspinatus Response Palpable increased muscle length    Subscapularis Response Palpable increased muscle length                  PT Short Term Goals - 08/23/20 1900      PT SHORT TERM GOAL #1   Title Ind with initial HEP    Status Achieved      PT SHORT TERM GOAL #2   Title The patient will report a 30% improvement in lower cervical, upper back pain level with ADLs    Status Achieved      PT SHORT TERM GOAL #3   Title Patient will have improved symmetrical cervical sidebending and rotation    Status Achieved             PT Long Term Goals - 07/17/20 1443      PT LONG TERM GOAL #1   Title The patient will be independent with a safe self progression of HEP    Time 8    Period Weeks    Status New    Target Date 09/11/20      PT LONG TERM GOAL #2   Title The patient will report a 70% improvement in pain across lower cervical/upper back region with home and work ADLs    Time 8    Period Weeks    Status New    Target Date 09/11/20      PT LONG TERM GOAL #3   Title Pt will be able to demonstrate imporved upright posture due to greater strength and endurance of periscapular and back extensor strength    Time 8    Period Weeks    Status New    Target Date 09/11/20      PT LONG TERM GOAL #4   Title FOTO functional outcome score improved from 55% functional to 66%    Time 8    Period Weeks    Status New    Target Date 09/11/20                 Plan - 08/23/20 0837    Clinical Impression Statement Signficant improvement in cervical flexion, bil sidebending and left rotation ROM and without pain.  She reports she can turn her head when driving much better.  She does have more discomfort in the evenings/night time than during the day.  Main area of pain less broad than previous and localized to  right periscapular region.  Therapist monitoring response throughout treatment session.  Short term goals met.    Personal Factors and Comorbidities Age;Time since onset of injury/illness/exacerbation;Comorbidity 1;Past/Current Experience    Comorbidities osteopenia    Rehab Potential Excellent    PT Frequency 1x / week    PT Duration 8 weeks    PT Treatment/Interventions ADLs/Self Care Home Management;Biofeedback;Cryotherapy;Electrical Stimulation;Moist Heat;Traction;Neuromuscular re-education;Therapeutic activities;Therapeutic exercise;Patient/family education;Manual techniques;Dry needling;Passive range of motion;Taping    PT Next Visit Plan DN; possible discharge in 1-2 visits if majority of goals met.    PT Home Exercise Plan Access Code: [email or web address]           Patient will benefit from skilled therapeutic intervention in order to improve the following deficits and impairments:  Pain,Hypomobility,Decreased strength,Postural dysfunction,Decreased range of motion,Increased muscle spasms  Visit Diagnosis: Cervicalgia  Abnormal posture     Problem List Patient Active Problem List   Diagnosis Date Noted  . Palpitations 01/06/2020  . New onset atrial fibrillation ([place]) 01/06/2020  . Spider veins of both lower extremities 05/09/2019  . Breast cancer of lower-outer quadrant of right female breast ([place]) 10/24/2014  . Routine health maintenance 03/30/2013  . PARESTHESIA 06/25/2010   Ruben Im, PT 08/23/20 7:02 PM Phone: [phone] Fax: [phone] Alvera Singh 08/23/2020, 7:02 PM  [facility] [address] [place], [place], [address] Phone: [phone]   Fax:  [phone]  Name: Crystal [name] MRN: [id number] Date of Birth: 1950/12/08

## 2020-08-28 ENCOUNTER — Other Ambulatory Visit: Payer: Self-pay | Admitting: Internal Medicine

## 2020-08-29 ENCOUNTER — Other Ambulatory Visit: Payer: Self-pay | Admitting: Nurse Practitioner

## 2020-08-30 ENCOUNTER — Ambulatory Visit (INDEPENDENT_AMBULATORY_CARE_PROVIDER_SITE_OTHER): Payer: Medicare Other | Admitting: Cardiology

## 2020-08-30 ENCOUNTER — Encounter: Payer: Self-pay | Admitting: Cardiology

## 2020-08-30 ENCOUNTER — Other Ambulatory Visit: Payer: Self-pay

## 2020-08-30 VITALS — BP 118/72 | HR 72 | Ht 70.0 in | Wt 145.0 lb

## 2020-08-30 DIAGNOSIS — I480 Paroxysmal atrial fibrillation: Secondary | ICD-10-CM | POA: Diagnosis not present

## 2020-08-30 MED ORDER — APIXABAN 5 MG PO TABS
5.0000 mg | ORAL_TABLET | Freq: Two times a day (BID) | ORAL | 1 refills | Status: DC
Start: 2020-08-30 — End: 2021-03-25

## 2020-08-30 NOTE — Progress Notes (Signed)
Electrophysiology Office Note   Date:  08/30/2020   ID:  Crystal [name], DOB 01/15/1951, MRN [id number]  PCP:  Hoyt Koch, MD  Cardiologist: Burt Knack Primary Electrophysiologist:  Taylormarie Register Meredith Leeds, MD    Chief Complaint: Atrial fibrillation   History of Present Illness: Crystal [name] is a 70 y.o. female who is being seen today for the evaluation of atrial fibrillation at the request of Hoyt Koch, *. Presenting today for electrophysiology evaluation.  She has a history significant for right-sided breast cancer and paroxysmal atrial fibrillation.  She was diagnosed 01/18/2020.  She is on metoprolol and Eliquis.  ZIO monitor showed a 5% atrial fibrillation burden with rapid ventricular response.  Her symptoms were occurring several times a week.  She is now status post ablation 05/25/2020.  Today, denies symptoms of palpitations, chest pain, shortness of breath, orthopnea, PND, lower extremity edema, claudication, dizziness, presyncope, syncope, bleeding, or neurologic sequela. The patient is tolerating medications without difficulties.  Overall she is feeling well.  She continues to have short episodes of atrial fibrillation, but the burden is less and the episodes are shorter.  She also is less symptomatic when she has her episodes.  Past Medical History:  Diagnosis Date  . BACK PAIN, LUMBAR   . Breast cancer of lower-outer quadrant of right female breast ([place]) 10/24/2014  . Breast cancer, right breast   . FIBROADENOMA, BREAST 2006  . New onset atrial fibrillation ([place]) 01/06/2020  . Palpitations 01/06/2020  . PARESTHESIA    toes  . PONV (postoperative nausea and vomiting)   . Radiation 11/23/14-12/25/14   right breast 42.72 gray, lumpectomy cavity boosted to 54.72 gray  . Routine health maintenance 03/30/2013   Colonoscopy - for colonoscopy (Sept '14)  Immunization  Tdap Oct '14   . Spider veins of both lower extremities 05/09/2019  . Wears glasses    Past  Surgical History:  Procedure Laterality Date  . ATRIAL FIBRILLATION ABLATION N/A 05/25/2020   Procedure: ATRIAL FIBRILLATION ABLATION;  Surgeon: Constance Haw, MD;  Location: [facility];  Service: Cardiovascular;  Laterality: N/A;  . atrophic vaginitis    . BREAST LUMPECTOMY WITH RADIOACTIVE SEED LOCALIZATION Right 10/18/2014   Procedure: BREAST LUMPECTOMY WITH RADIOACTIVE SEED LOCALIZATION;  Surgeon: Coralie Keens, MD;  Location: [facility];  Service: General;  Laterality: Right;  . COLONOSCOPY  09/05/2013  . D & C and hysteroscopy  11/1998   for uterine polyp  . DILATATION & CURRETTAGE/HYSTEROSCOPY WITH RESECTOCOPE N/A 05/30/2015   Procedure: DILATATION & CURETTAGE/HYSTEROSCOPY WITH RESECTOCOPE;  Surgeon: Princess Bruins, MD;  Location: [facility];  Service: Gynecology;  Laterality: N/A;  . DILATION AND CURETTAGE OF UTERUS    . Sterotactix needle biopsy  2000   breast calcification     Current Outpatient Medications  Medication Sig Dispense Refill  . acetaminophen (TYLENOL) 325 MG tablet Take 325 mg by mouth as needed for moderate pain or headache.    . Calcium Carbonate-Vitamin D 600-400 MG-UNIT tablet Take 1 tablet by mouth daily.    . cetirizine (ZYRTEC) 10 MG tablet Take 10 mg by mouth daily.    . Cholecalciferol (VITAMIN D3) 1000 UNITS CAPS Take 1,000 Units by mouth daily.    . metoprolol succinate (TOPROL-XL) 25 MG 24 hr tablet Take 1 tablet (25 mg total) by mouth in the morning and at bedtime. 60 tablet 3  . Multiple Vitamin (MULTIVITAMIN) tablet Take 1 tablet by mouth daily.    Vladimir Faster  Glycol-Propyl Glycol (SYSTANE OP) Place 1 drop into both eyes daily as needed (For dryness.).     Marland Kitchen Probiotic Product (FORTIFY DAILY PROBIOTIC PO) Take 1 capsule by mouth daily.     Marland Kitchen trimethoprim (TRIMPEX) 100 MG tablet Take 100 mg by mouth at bedtime.     Marland Kitchen apixaban (ELIQUIS) 5 MG TABS tablet Take 1 tablet (5 mg total) by mouth 2 (two) times daily. 90 tablet 1    No current facility-administered medications for this visit.    Allergies:   Patient has no known allergies.   Social History:  The patient  reports that she has never smoked. She has never used smokeless tobacco. She reports current alcohol use of about 5.0 standard drinks of alcohol per week. She reports that she does not use drugs.   Family History:  The patient's family history includes Arrhythmia in her mother; Cancer in her mother; Coronary artery disease in her father and maternal grandfather; Diabetes in her father; GER disease in her mother; Hyperlipidemia in her father; Hypertension in her father and mother; Hypothyroidism in her mother; Osteoporosis in her mother.   ROS:  Please see the history of present illness.   Otherwise, review of systems is positive for none.   All other systems are reviewed and negative.   PHYSICAL EXAM: VS:  BP 118/72   Pulse 72   Ht 5\' 10"  (1.778 m)   Wt 145 lb (65.8 kg)   SpO2 98%   BMI 20.81 kg/m  , BMI Body mass index is 20.81 kg/m. GEN: Well nourished, well developed, in no acute distress  HEENT: normal  Neck: no JVD, carotid bruits, or masses Cardiac: RRR; no murmurs, rubs, or gallops,no edema  Respiratory:  clear to auscultation bilaterally, normal work of breathing GI: soft, nontender, nondistended, + BS MS: no deformity or atrophy  Skin: warm and dry Neuro:  Strength and sensation are intact Psych: euthymic mood, full affect  EKG:  EKG is ordered today. Personal review of the ekg ordered shows sinus rhythm, rate 72  Recent Labs: 01/05/2020: ALT 15; TSH 0.08 05/01/2020: BUN 15; Creatinine, Ser 1.00; Hemoglobin 12.2; Platelets 259; Potassium 5.2; Sodium 140    Lipid Panel     Component Value Date/Time   CHOL 199 05/09/2019 1005   TRIG 123.0 05/09/2019 1005   HDL 77.50 05/09/2019 1005   CHOLHDL 3 05/09/2019 1005   VLDL 24.6 05/09/2019 1005   LDLCALC 97 05/09/2019 1005     Wt Readings from Last 3 Encounters:  08/30/20 145  lb (65.8 kg)  06/25/20 149 lb 12.8 oz (67.9 kg)  05/29/20 151 lb (68.5 kg)      Other studies Reviewed: Additional studies/ records that were reviewed today include: TTE 01/31/20  Review of the above records today demonstrates:  1. Left ventricular ejection fraction, by estimation, is 60 to 65%. The  left ventricle has normal function. The left ventricle has no regional  wall motion abnormalities. Left ventricular diastolic parameters were  normal.  2. Right ventricular systolic function is normal. The right ventricular  size is normal. There is normal pulmonary artery systolic pressure.  3. The mitral valve is normal in structure. Trivial mitral valve  regurgitation. No evidence of mitral stenosis.  4. The aortic valve is tricuspid. Aortic valve regurgitation is trivial.  No aortic stenosis is present.  5. The inferior vena cava is normal in size with greater than 50%  respiratory variability, suggesting right atrial pressure of 3 mmHg.   Monitor  02/21/20 personally reviewed 1. The basic rhythm is normal sinus with an average HR of 71 bpm 2. Atrial fibrillation with rapid ventricular rate occurs with a 5% burden 3. No high-grade heart block or pathologic pauses 4. No bradycardic events 5. No significant ventricular ectopy    ASSESSMENT AND PLAN:  1.  Paroxysmal atrial fibrillation: CHA2DS2-VASc of 2.  Currently on Eliquis and metoprolol.  Is status post ablation 05/25/2020.  She is continued to have short episodes of atrial fibrillation, though she is improved.  We Analyse Angst continue with current management.  Current medicines are reviewed at length with the patient today.   The patient does not have concerns regarding her medicines.  The following changes were made today: None  Labs/ tests ordered today include:  Orders Placed This Encounter  Procedures  . EKG 12-Lead     Disposition:   FU with Tajana Crotteau 3 months  Signed, Genia Perin Meredith Leeds, MD  08/30/2020 8:53 AM      [facility] [address] [place] [address] [phone] (office) [phone] (fax)

## 2020-08-30 NOTE — Patient Instructions (Signed)
Medication Instructions:  Your physician recommends that you continue on your current medications as directed. Please refer to the Current Medication list given to you today.  *If you need a refill on your cardiac medications before your next appointment, please call your pharmacy*   Lab Work: None ordered   Testing/Procedures: None ordered   Follow-Up: At [facility], you and your health needs are our priority.  As part of our continuing mission to provide you with exceptional heart care, we have created designated Provider Care Teams.  These Care Teams include your primary Cardiologist (physician) and Advanced Practice Providers (APPs -  Physician Assistants and Nurse Practitioners) who all work together to provide you with the care you need, when you need it.  Your next appointment:   3 month(s)  The format for your next appointment:   In Person  Provider:   Will Camnitz, MD    Thank you for choosing [facility]!!   Rayna Brenner, RN [phone]     

## 2020-08-31 ENCOUNTER — Ambulatory Visit: Payer: Medicare Other | Attending: Internal Medicine | Admitting: Physical Therapy

## 2020-08-31 ENCOUNTER — Other Ambulatory Visit: Payer: Self-pay

## 2020-08-31 DIAGNOSIS — M542 Cervicalgia: Secondary | ICD-10-CM | POA: Diagnosis present

## 2020-08-31 DIAGNOSIS — R293 Abnormal posture: Secondary | ICD-10-CM | POA: Diagnosis present

## 2020-08-31 NOTE — Patient Instructions (Signed)
Access Code: [email or web address] URL: [email or web address] Date: 08/31/2020 Prepared by: Ruben Im  Exercises Shoulder W - External Rotation with Resistance - 1 x daily - 7 x weekly - 3 sets - 10 reps Seated Shoulder Diagonal Pulls with Resistance - 1 x daily - 7 x weekly - 3 sets - 10 reps Seated Shoulder Horizontal Abduction with Resistance - 1 x daily - 7 x weekly - 3 sets - 10 reps Seated Cervical Sidebending Stretch - 1 x daily - 7 x weekly - 3 sets - 10 reps Seated Cervical Rotation AROM - 3 x daily - 7 x weekly - 1 sets - 10 reps - 5 hold Seated Assisted Cervical Rotation with Towel - 1 x daily - 7 x weekly - 1 sets - 10 reps Seated Thoracic Lumbar Extension with Pectoralis Stretch - 1 x daily - 7 x weekly - 1 sets - 10 reps Supine Chest Stretch on Foam Roll - 1 x daily - 7 x weekly - 1 sets - 10 reps Supine Thoracic Mobilization Foam Roll Horizontal with Arm Stretch - 1 x daily - 7 x weekly - 1 sets - 10 reps Shoulder extension with resistance - Neutral - 1 x daily - 7 x weekly - 2 sets - 10 reps Standing Single Arm High Row with Resistance - 1 x daily - 7 x weekly - 2 sets - 10 reps Seated Shoulder Press with Resistance - 1 x daily - 7 x weekly - 1 sets - 10 reps SHAVE THE HEAD WITH YELLOW BAND - 1 x daily - 7 x weekly - 1 sets - 10 reps

## 2020-08-31 NOTE — Therapy (Signed)
[facility] [address] [place], [place], [address] Phone: [phone]   Fax:  [phone]  Physical Therapy Treatment  Patient Details  Name: Crystal [name] MRN: [id number] Date of Birth: 10-19-1950 Referring Provider (PT): Hoyt Koch, MD   Encounter Date: 08/31/2020   PT End of Session - 08/31/20 1322    Visit Number 6    Date for PT Re-Evaluation 09/11/20    Authorization Type medicare A/B    PT Start Time 0845    PT Stop Time 0930    PT Time Calculation (min) 45 min    Activity Tolerance Patient tolerated treatment well           Past Medical History:  Diagnosis Date  . BACK PAIN, LUMBAR   . Breast cancer of lower-outer quadrant of right female breast ([place]) 10/24/2014  . Breast cancer, right breast   . FIBROADENOMA, BREAST 2006  . New onset atrial fibrillation ([place]) 01/06/2020  . Palpitations 01/06/2020  . PARESTHESIA    toes  . PONV (postoperative nausea and vomiting)   . Radiation 11/23/14-12/25/14   right breast 42.72 gray, lumpectomy cavity boosted to 54.72 gray  . Routine health maintenance 03/30/2013   Colonoscopy - for colonoscopy (Sept '14)  Immunization  Tdap Oct '14   . Spider veins of both lower extremities 05/09/2019  . Wears glasses     Past Surgical History:  Procedure Laterality Date  . ATRIAL FIBRILLATION ABLATION N/A 05/25/2020   Procedure: ATRIAL FIBRILLATION ABLATION;  Surgeon: Constance Haw, MD;  Location: [facility];  Service: Cardiovascular;  Laterality: N/A;  . atrophic vaginitis    . BREAST LUMPECTOMY WITH RADIOACTIVE SEED LOCALIZATION Right 10/18/2014   Procedure: BREAST LUMPECTOMY WITH RADIOACTIVE SEED LOCALIZATION;  Surgeon: Coralie Keens, MD;  Location: [facility];  Service: General;  Laterality: Right;  . COLONOSCOPY  09/05/2013  . D & C and hysteroscopy  11/1998   for uterine polyp  . DILATATION & CURRETTAGE/HYSTEROSCOPY WITH  RESECTOCOPE N/A 05/30/2015   Procedure: DILATATION & CURETTAGE/HYSTEROSCOPY WITH RESECTOCOPE;  Surgeon: Princess Bruins, MD;  Location: [facility];  Service: Gynecology;  Laterality: N/A;  . DILATION AND CURETTAGE OF UTERUS    . Sterotactix needle biopsy  2000   breast calcification    There were no vitals filed for this visit.   Subjective Assessment - 08/31/20 0847    Subjective I've gone backwards.  I've had to use the heat pad every night.    Currently in Pain? Yes    Pain Score 4     Pain Location Shoulder    Pain Orientation Right;Left                             [facility] Adult PT Treatment/Exercise - 08/31/20 0001      Neuro Re-ed    Neuro Re-ed Details  neuroscience of pain; CNS hypersensitivity; techniques to calm the nervous system      Neck Exercises: Seated   Other Seated Exercise Pilates yellow band press up 10x    Other Seated Exercise pilates yellow band "shave the head" 8x      Shoulder Exercises: Standing   Extension Both;10 reps    Theraband Level (Shoulder Extension) Level 2 (Red)    Extension Limitations squeeze the pencil    Row Limitations row + rotation red band alternating sides 15x      Moist Heat Therapy  Number Minutes Moist Heat 3 Minutes    Moist Heat Location Cervical      Manual Therapy   Soft tissue mobilization bil cervical paraspinals and bil uppper traps, levator scap, rhomboids, periscapular muscles            Trigger Point Dry Needling - 08/31/20 0001    Consent Given? Yes    Other Dry Needling bil    Upper Trapezius Response Twitch reponse elicited;Palpable increased muscle length   bil   Levator Scapulae Response Palpable increased muscle length    Cervical multifidi Response Twitch reponse elicited;Palpable increased muscle length   bil   Rhomboids Response Palpable increased muscle length    Supraspinatus Response Palpable increased muscle length                PT Education - 08/31/20 1321     Education Details Pilates scap squeeze extension red band; row+rotation red band;  yellow band seated press; yellow band seated "shave the head"    Person(s) Educated Patient    Methods Explanation;Demonstration;Handout    Comprehension Returned demonstration;Verbalized understanding            PT Short Term Goals - 08/23/20 1900      PT SHORT TERM GOAL #1   Title Ind with initial HEP    Status Achieved      PT SHORT TERM GOAL #2   Title The patient will report a 30% improvement in lower cervical, upper back pain level with ADLs    Status Achieved      PT SHORT TERM GOAL #3   Title Patient will have improved symmetrical cervical sidebending and rotation    Status Achieved             PT Long Term Goals - 07/17/20 1443      PT LONG TERM GOAL #1   Title The patient will be independent with a safe self progression of HEP    Time 8    Period Weeks    Status New    Target Date 09/11/20      PT LONG TERM GOAL #2   Title The patient will report a 70% improvement in pain across lower cervical/upper back region with home and work ADLs    Time 8    Period Weeks    Status New    Target Date 09/11/20      PT LONG TERM GOAL #3   Title Pt will be able to demonstrate imporved upright posture due to greater strength and endurance of periscapular and back extensor strength    Time 8    Period Weeks    Status New    Target Date 09/11/20      PT LONG TERM GOAL #4   Title FOTO functional outcome score improved from 55% functional to 66%    Time 8    Period Weeks    Status New    Target Date 09/11/20                 Plan - 08/31/20 8119    Clinical Impression Statement Patient reports an exacerbation recently for no apparent reason.  Symptoms primarily in bil upper trap regions.  No increase in overall number or size of tender points noted.  She continues to respond well to DN and manual therapy.  Initiated Pilates style exercises with encouragement for painfree  movement only.  Patient education on nutritional, sleep hygeine and movement to further assist with pain reduction.  Personal Factors and Comorbidities Age;Time since onset of injury/illness/exacerbation;Comorbidity 1;Past/Current Experience    Comorbidities osteopenia    Rehab Potential Excellent    PT Frequency 1x / week    PT Duration 8 weeks    PT Treatment/Interventions ADLs/Self Care Home Management;Biofeedback;Cryotherapy;Electrical Stimulation;Moist Heat;Traction;Neuromuscular re-education;Therapeutic activities;Therapeutic exercise;Patient/family education;Manual techniques;Dry needling;Passive range of motion;Taping    PT Next Visit Plan DN; possible discharge in 1-2 visits if majority of goals met; review Pilates style ex's as needed    PT Home Exercise Plan Access Code: [email or web address]           Patient will benefit from skilled therapeutic intervention in order to improve the following deficits and impairments:  Pain,Hypomobility,Decreased strength,Postural dysfunction,Decreased range of motion,Increased muscle spasms  Visit Diagnosis: Cervicalgia  Abnormal posture     Problem List Patient Active Problem List   Diagnosis Date Noted  . Palpitations 01/06/2020  . New onset atrial fibrillation ([place]) 01/06/2020  . Spider veins of both lower extremities 05/09/2019  . Breast cancer of lower-outer quadrant of right female breast ([place]) 10/24/2014  . Routine health maintenance 03/30/2013  . PARESTHESIA 06/25/2010   Ruben Im, PT 08/31/20 1:27 PM Phone: [phone] Fax: [phone] Alvera Singh 08/31/2020, 1:27 PM  [facility] [address], [place], [place], [address] Phone: [phone]   Fax:  [phone]  Name: AREEBA SULSER MRN: [id number] Date of Birth: December 09, 1950

## 2020-09-04 ENCOUNTER — Ambulatory Visit (INDEPENDENT_AMBULATORY_CARE_PROVIDER_SITE_OTHER): Payer: Medicare Other | Admitting: Internal Medicine

## 2020-09-04 ENCOUNTER — Other Ambulatory Visit: Payer: Self-pay

## 2020-09-04 ENCOUNTER — Encounter: Payer: Self-pay | Admitting: Internal Medicine

## 2020-09-04 VITALS — BP 118/78 | HR 77 | Temp 98.9°F | Resp 18 | Ht 70.0 in | Wt 145.0 lb

## 2020-09-04 DIAGNOSIS — R7301 Impaired fasting glucose: Secondary | ICD-10-CM

## 2020-09-04 DIAGNOSIS — Z853 Personal history of malignant neoplasm of breast: Secondary | ICD-10-CM

## 2020-09-04 DIAGNOSIS — R911 Solitary pulmonary nodule: Secondary | ICD-10-CM | POA: Diagnosis not present

## 2020-09-04 DIAGNOSIS — I480 Paroxysmal atrial fibrillation: Secondary | ICD-10-CM | POA: Diagnosis not present

## 2020-09-04 DIAGNOSIS — Z13220 Encounter for screening for lipoid disorders: Secondary | ICD-10-CM

## 2020-09-04 DIAGNOSIS — R634 Abnormal weight loss: Secondary | ICD-10-CM

## 2020-09-04 LAB — COMPREHENSIVE METABOLIC PANEL
ALT: 80 U/L — ABNORMAL HIGH (ref 0–35)
AST: 50 U/L — ABNORMAL HIGH (ref 0–37)
Albumin: 3.9 g/dL (ref 3.5–5.2)
Alkaline Phosphatase: 58 U/L (ref 39–117)
BUN: 22 mg/dL (ref 6–23)
CO2: 31 mEq/L (ref 19–32)
Calcium: 9.8 mg/dL (ref 8.4–10.5)
Chloride: 103 mEq/L (ref 96–112)
Creatinine, Ser: 0.83 mg/dL (ref 0.40–1.20)
GFR: 72.18 mL/min (ref >60.00)
Glucose, Bld: 72 mg/dL (ref 70–99)
Potassium: 4.3 mEq/L (ref 3.5–5.1)
Sodium: 140 mEq/L (ref 135–145)
Total Bilirubin: 0.5 mg/dL (ref 0.2–1.2)
Total Protein: 6.8 g/dL (ref 6.0–8.3)

## 2020-09-04 LAB — CBC
HCT: 34.9 % — ABNORMAL LOW (ref 36.0–46.0)
Hemoglobin: 11.9 g/dL — ABNORMAL LOW (ref 12.0–15.0)
MCHC: 34.1 g/dL (ref 30.0–36.0)
MCV: 88.6 fl (ref 78.0–100.0)
Platelets: 235 10*3/uL (ref 150.0–400.0)
RBC: 3.94 Mil/uL (ref 3.87–5.11)
RDW: 12.6 % (ref 11.5–15.5)
WBC: 8.2 10*3/uL (ref 4.0–10.5)

## 2020-09-04 LAB — LIPID PANEL
Cholesterol: 147 mg/dL (ref 0–200)
HDL: 57.3 mg/dL (ref >39.00)
LDL Cholesterol: 62 mg/dL (ref 0–99)
NonHDL: 90.01
Total CHOL/HDL Ratio: 3
Triglycerides: 138 mg/dL (ref 0.0–149.0)
VLDL: 27.6 mg/dL (ref 0.0–40.0)

## 2020-09-04 LAB — TSH: TSH: <0.01 u[IU]/mL — ABNORMAL LOW (ref 0.35–4.50)

## 2020-09-04 LAB — HEMOGLOBIN A1C: Hgb A1c MFr Bld: 5.7 % (ref 4.6–6.5)

## 2020-09-04 NOTE — Progress Notes (Signed)
   Subjective:   Patient ID: Crystal [name], female    DOB: Jan 03, 1951, 70 y.o.   MRN: [id number]  HPI The patient is a 70 YO female coming in for follow up A fib (diagnosed July 2021, started on metoprolol and eliquis, s/p ablation Nov 2021, following with cardiology and still have rare episodes of SVT with rvr), and lung nodule (noted on scan prior to ablation, 6 mm, she is low risk never smoker) and weight loss (down about 10 pounds in the last 6 months, up to date on mammogram and colonoscopy, past history breast cancer 2016 no new lumps noticed, denies change in diet although maybe eating slightly less, denies fevers or chills).   Review of Systems  Constitutional: Positive for unexpected weight change.  HENT: Negative.   Eyes: Negative.   Respiratory: Negative for cough, chest tightness and shortness of breath.   Cardiovascular: Positive for palpitations. Negative for chest pain and leg swelling.  Gastrointestinal: Negative for abdominal distention, abdominal pain, constipation, diarrhea, nausea and vomiting.  Musculoskeletal: Negative.   Skin: Negative.   Neurological: Negative.   Psychiatric/Behavioral: Negative.     Objective:  Physical Exam Constitutional:      Appearance: She is well-developed and well-nourished.  HENT:     Head: Normocephalic and atraumatic.  Eyes:     Extraocular Movements: EOM normal.  Cardiovascular:     Rate and Rhythm: Normal rate and regular rhythm.  Pulmonary:     Effort: Pulmonary effort is normal. No respiratory distress.     Breath sounds: Normal breath sounds. No wheezing or rales.  Abdominal:     General: Bowel sounds are normal. There is no distension.     Palpations: Abdomen is soft.     Tenderness: There is no abdominal tenderness. There is no rebound.  Musculoskeletal:        General: No edema.     Cervical back: Normal range of motion.  Skin:    General: Skin is warm and dry.  Neurological:     Mental Status: She is alert and  oriented to person, place, and time.     Coordination: Coordination normal.  Psychiatric:        Mood and Affect: Mood and affect normal.     Vitals:   09/04/20 0806  BP: 118/78  Pulse: 77  Resp: 18  Temp: 98.9 F (37.2 C)  TempSrc: Oral  SpO2: 98%  Weight: 145 lb (65.8 kg)  Height: 5\' 10"  (1.778 m)    This visit occurred during the SARS-CoV-2 public health emergency.  Safety protocols were in place, including screening questions prior to the visit, additional usage of staff PPE, and extensive cleaning of exam room while observing appropriate contact time as indicated for disinfecting solutions.   Assessment & Plan:

## 2020-09-04 NOTE — Patient Instructions (Addendum)
We will check the labs today and ordered the CT lung to check the nodule. If that is the same size we will check this again in about a year.   You can get a booster shot of covid-19 around mid May.   Let us know if the physical therapy helps.  Health Maintenance, Female Adopting a healthy lifestyle and getting preventive care are important in promoting health and wellness. Ask your health care provider about:  The right schedule for you to have regular tests and exams.  Things you can do on your own to prevent diseases and keep yourself healthy. What should I know about diet, weight, and exercise? Eat a healthy diet  Eat a diet that includes plenty of vegetables, fruits, low-fat dairy products, and lean protein.  Do not eat a lot of foods that are high in solid fats, added sugars, or sodium.   Maintain a healthy weight Body mass index (BMI) is used to identify weight problems. It estimates body fat based on height and weight. Your health care provider can help determine your BMI and help you achieve or maintain a healthy weight. Get regular exercise Get regular exercise. This is one of the most important things you can do for your health. Most adults should:  Exercise for at least 150 minutes each week. The exercise should increase your heart rate and make you sweat (moderate-intensity exercise).  Do strengthening exercises at least twice a week. This is in addition to the moderate-intensity exercise.  Spend less time sitting. Even light physical activity can be beneficial. Watch cholesterol and blood lipids Have your blood tested for lipids and cholesterol at 69 years of age, then have this test every 5 years. Have your cholesterol levels checked more often if:  Your lipid or cholesterol levels are high.  You are older than 69 years of age.  You are at high risk for heart disease. What should I know about cancer screening? Depending on your health history and family history,  you may need to have cancer screening at various ages. This may include screening for:  Breast cancer.  Cervical cancer.  Colorectal cancer.  Skin cancer.  Lung cancer. What should I know about heart disease, diabetes, and high blood pressure? Blood pressure and heart disease  High blood pressure causes heart disease and increases the risk of stroke. This is more likely to develop in people who have high blood pressure readings, are of African descent, or are overweight.  Have your blood pressure checked: ? Every 3-5 years if you are 10-23 years of age. ? Every year if you are 3 years old or older. Diabetes Have regular diabetes screenings. This checks your fasting blood sugar level. Have the screening done:  Once every three years after age 41 if you are at a normal weight and have a low risk for diabetes.  More often and at a younger age if you are overweight or have a high risk for diabetes. What should I know about preventing infection? Hepatitis B If you have a higher risk for hepatitis B, you should be screened for this virus. Talk with your health care provider to find out if you are at risk for hepatitis B infection. Hepatitis C Testing is recommended for:  Everyone born from [birth year] through 1964.  Anyone with known risk factors for hepatitis C. Sexually transmitted infections (STIs)  Get screened for STIs, including gonorrhea and chlamydia, if: ? You are sexually active and are younger than 69 years  of age. ? You are older than 69 years of age and your health care provider tells you that you are at risk for this type of infection. ? Your sexual activity has changed since you were last screened, and you are at increased risk for chlamydia or gonorrhea. Ask your health care provider if you are at risk.  Ask your health care provider about whether you are at high risk for HIV. Your health care provider may recommend a prescription medicine to help prevent HIV infection.  If you choose to take medicine to prevent HIV, you should first get tested for HIV. You should then be tested every 3 months for as long as you are taking the medicine. Pregnancy  If you are about to stop having your period (premenopausal) and you may become pregnant, seek counseling before you get pregnant.  Take 400 to 800 micrograms (mcg) of folic acid every day if you become pregnant.  Ask for birth control (contraception) if you want to prevent pregnancy. Osteoporosis and menopause Osteoporosis is a disease in which the bones lose minerals and strength with aging. This can result in bone fractures. If you are 67 years old or older, or if you are at risk for osteoporosis and fractures, ask your health care provider if you should:  Be screened for bone loss.  Take a calcium or vitamin D supplement to lower your risk of fractures.  Be given hormone replacement therapy (HRT) to treat symptoms of menopause. Follow these instructions at home: Lifestyle  Do not use any products that contain nicotine or tobacco, such as cigarettes, e-cigarettes, and chewing tobacco. If you need help quitting, ask your health care provider.  Do not use street drugs.  Do not share needles.  Ask your health care provider for help if you need support or information about quitting drugs. Alcohol use  Do not drink alcohol if: ? Your health care provider tells you not to drink. ? You are pregnant, may be pregnant, or are planning to become pregnant.  If you drink alcohol: ? Limit how much you use to 0-1 drink a day. ? Limit intake if you are breastfeeding.  Be aware of how much alcohol is in your drink. In the [place] one drink equals one 12 oz bottle of beer (355 mL), one 5 oz glass of wine (148 mL), or one 1 oz glass of hard liquor (44 mL). General instructions  Schedule regular health, dental, and eye exams.  Stay current with your vaccines.  Tell your health care provider if: ? You often feel  depressed. ? You have ever been abused or do not feel safe at home. Summary  Adopting a healthy lifestyle and getting preventive care are important in promoting health and wellness.  Follow your health care provider's instructions about healthy diet, exercising, and getting tested or screened for diseases.  Follow your health care provider's instructions on monitoring your cholesterol and blood pressure. This information is not intended to replace advice given to you by your health care provider. Make sure you discuss any questions you have with your health care provider. Document Revised: 06/09/2018 Document Reviewed: 06/09/2018 Elsevier Patient Education  2021 [employer].

## 2020-09-04 NOTE — Assessment & Plan Note (Signed)
S/p 5 years anastrozole and on yearly mammogram.

## 2020-09-04 NOTE — Assessment & Plan Note (Signed)
Seeing cardiology and s/p ablation. Taking eliquis and metoprolol. HR at goal today and appears regular today. No signs/symptoms of bleeding. Checking CBC and CMP for monitoring as well as TSH.

## 2020-09-04 NOTE — Assessment & Plan Note (Signed)
Checking TSH and CBC and CMP and HgA1c today to rule out cause. She is up to date on breast and colon screening. Lung nodule 6 mm noted on recent CT Nov 2021 which is due for follow up Feb 2022-May 2022 with an additional follow up in 1 year depending. She is low risk and never smoker.

## 2020-09-04 NOTE — Assessment & Plan Note (Signed)
6 mm noted on CT prior to ablation. CT lung without contrast ordered today for follow up. She is low risk.

## 2020-09-06 ENCOUNTER — Other Ambulatory Visit (INDEPENDENT_AMBULATORY_CARE_PROVIDER_SITE_OTHER): Payer: Medicare Other

## 2020-09-06 ENCOUNTER — Other Ambulatory Visit: Payer: Self-pay | Admitting: Internal Medicine

## 2020-09-06 DIAGNOSIS — R946 Abnormal results of thyroid function studies: Secondary | ICD-10-CM | POA: Diagnosis not present

## 2020-09-06 DIAGNOSIS — I480 Paroxysmal atrial fibrillation: Secondary | ICD-10-CM

## 2020-09-06 LAB — T4, FREE: Free T4: 1.79 ng/dL — ABNORMAL HIGH (ref 0.60–1.60)

## 2020-09-07 ENCOUNTER — Other Ambulatory Visit: Payer: Self-pay | Admitting: Internal Medicine

## 2020-09-07 DIAGNOSIS — I480 Paroxysmal atrial fibrillation: Secondary | ICD-10-CM

## 2020-09-07 DIAGNOSIS — E0590 Thyrotoxicosis, unspecified without thyrotoxic crisis or storm: Secondary | ICD-10-CM

## 2020-09-18 ENCOUNTER — Other Ambulatory Visit: Payer: Self-pay

## 2020-09-18 ENCOUNTER — Ambulatory Visit: Payer: Medicare Other | Admitting: Physical Therapy

## 2020-09-18 DIAGNOSIS — R293 Abnormal posture: Secondary | ICD-10-CM

## 2020-09-18 DIAGNOSIS — M542 Cervicalgia: Secondary | ICD-10-CM | POA: Diagnosis not present

## 2020-09-18 NOTE — Therapy (Signed)
[facility] [address], [place], [place], [address] Phone: [phone]   Fax:  [phone]  Physical Therapy Treatment/Recertification  Patient Details  Name: Crystal [name] MRN: [id number] Date of Birth: [birth year] Referring Provider (PT): Hoyt Koch, MD   Encounter Date: 09/18/2020   PT End of Session - 09/18/20 1356    Visit Number 7    Date for PT Re-Evaluation 09/11/20    Authorization Type medicare A/B    PT Start Time 0800    PT Stop Time 0845    PT Time Calculation (min) 45 min    Activity Tolerance Patient tolerated treatment well           Past Medical History:  Diagnosis Date  . BACK PAIN, LUMBAR   . Breast cancer of lower-outer quadrant of right female breast ([place]) 10/24/2014  . Breast cancer, right breast   . FIBROADENOMA, BREAST 2006  . New onset atrial fibrillation ([place]) 01/06/2020  . Palpitations 01/06/2020  . PARESTHESIA    toes  . PONV (postoperative nausea and vomiting)   . Radiation 11/23/14-12/25/14   right breast 42.72 gray, lumpectomy cavity boosted to 54.72 gray  . Routine health maintenance 03/30/2013   Colonoscopy - for colonoscopy (Sept '14)  Immunization  Tdap Oct '14   . Spider veins of both lower extremities 05/09/2019  . Wears glasses     Past Surgical History:  Procedure Laterality Date  . ATRIAL FIBRILLATION ABLATION N/A 05/25/2020   Procedure: ATRIAL FIBRILLATION ABLATION;  Surgeon: Constance Haw, MD;  Location: [place] [facility];  Service: Cardiovascular;  Laterality: N/A;  . atrophic vaginitis    . BREAST LUMPECTOMY WITH RADIOACTIVE SEED LOCALIZATION Right 10/18/2014   Procedure: BREAST LUMPECTOMY WITH RADIOACTIVE SEED LOCALIZATION;  Surgeon: Coralie Keens, MD;  Location: [place];  Service: General;  Laterality: Right;  . COLONOSCOPY  09/05/2013  . D & C and hysteroscopy  11/1998   for uterine polyp  . DILATATION &  CURRETTAGE/HYSTEROSCOPY WITH RESECTOCOPE N/A 05/30/2015   Procedure: DILATATION & CURETTAGE/HYSTEROSCOPY WITH RESECTOCOPE;  Surgeon: Princess Bruins, MD;  Location: [facility];  Service: Gynecology;  Laterality: N/A;  . DILATION AND CURETTAGE OF UTERUS    . Sterotactix needle biopsy  2000   breast calcification    There were no vitals filed for this visit.   Subjective Assessment - 09/18/20 0801    Subjective It comes and goes.  I saw the doctor and she suggested getting an x-ray of my spine.  It's not as bad as it was but there is still something there.    Currently in Pain? Yes    Pain Score 2     Pain Location Neck    Pain Orientation Right;Left    Pain Type Chronic pain              [facility] PT Assessment - 09/18/20 0001      Assessment   Medical Diagnosis M54.2 (ICD-10-CM) - Neck pain    Referring Provider (PT) Hoyt Koch, MD    Onset Date/Surgical Date --   December got worse   Prior Therapy 2 years ago similar/same issue - DN helped a lot      Observation/Other Assessments   Focus on Therapeutic Outcomes (FOTO)  70%      AROM   Overall AROM Comments shoulder bil WNL    Cervical Flexion 52    Cervical Extension 70    Cervical - Right Side  Bend 48    Cervical - Left Side Bend 44    Cervical - Right Rotation 50    Cervical - Left Rotation 48      Strength   Overall Strength Comments bil shoulders 5/5                         [facility] Adult PT Treatment/Exercise - 09/18/20 0001      Neck Exercises: Seated   Other Seated Exercise Pilates yellow band press up 10x    Other Seated Exercise pilates yellow band "shave the head" 8x      Moist Heat Therapy   Number Minutes Moist Heat 3 Minutes    Moist Heat Location Cervical      Manual Therapy   Manual therapy comments Kinesiotape bil upper traps to medial scapula    Soft tissue mobilization bil upper traps, right periscapular muscles            Trigger Point Dry Needling - 09/18/20 0001     Consent Given? Yes    Muscles Treated Upper Quadrant Teres major    Upper Trapezius Response Palpable increased muscle length;Twitch reponse elicited    Levator Scapulae Response Palpable increased muscle length    Rhomboids Response Palpable increased muscle length    Supraspinatus Response Palpable increased muscle length    Infraspinatus Response Palpable increased muscle length    Teres major Response Palpable increased muscle length                  PT Short Term Goals - 09/18/20 2035      PT SHORT TERM GOAL #1   Title Ind with initial HEP    Status Achieved      PT SHORT TERM GOAL #2   Title The patient will report a 30% improvement in lower cervical, upper back pain level with ADLs    Status Achieved      PT SHORT TERM GOAL #3   Title Patient will have improved symmetrical cervical sidebending and rotation    Status Achieved             PT Long Term Goals - 09/18/20 2035      PT LONG TERM GOAL #1   Title The patient will be independent with a safe self progression of HEP    Time 12    Period Weeks    Status On-going    Target Date 12/11/20      PT LONG TERM GOAL #2   Title The patient will report a 70% improvement in pain across lower cervical/upper back region with home and work ADLs    Time 12    Period Weeks    Status On-going      PT LONG TERM GOAL #3   Title Pt will be able to demonstrate imporved upright posture due to greater strength and endurance of periscapular and back extensor strength    Time 12    Period Weeks    Status On-going      PT LONG TERM GOAL #4   Title FOTO functional outcome score improved from 55% functional to 66%    Status Achieved                 Plan - 09/18/20 1356    Clinical Impression Statement Good improvements in cervical ROM in all planes atlhough left sidebend and rotation slightly less than right (limited by shortened soft tissue on right).  Her FOTO  score has also significantly improved from   55% to 70%.   She reports overall pain intensity with turning in bed has improved although she continues to have right periscapular region pain particularly at the end of the day.  She would benefit from PT for a progression of exercise for increased resiliency to injury and exacerbation of symptoms and continue manual therapy and DN as well.  Will decrease treatment frequency to biweekly.    Personal Factors and Comorbidities Age;Time since onset of injury/illness/exacerbation;Comorbidity 1;Past/Current Experience    Comorbidities osteopenia    Examination-Activity Limitations Lift;Sleep    Stability/Clinical Decision Making Stable/Uncomplicated    Rehab Potential Excellent    PT Frequency Biweekly    PT Duration 12 weeks    PT Treatment/Interventions ADLs/Self Care Home Management;Biofeedback;Cryotherapy;Electrical Stimulation;Moist Heat;Traction;Neuromuscular re-education;Therapeutic activities;Therapeutic exercise;Patient/family education;Manual techniques;Dry needling;Passive range of motion;Taping    PT Next Visit Plan DN; manual therapy right scapular region;  progressive peri scap muscle strengthening    PT Home Exercise Plan Access Code: [email or web address]           Patient will benefit from skilled therapeutic intervention in order to improve the following deficits and impairments:  Pain,Hypomobility,Decreased strength,Postural dysfunction,Decreased range of motion,Increased muscle spasms  Visit Diagnosis: Cervicalgia - Plan: PT plan of care cert/re-cert  Abnormal posture - Plan: PT plan of care cert/re-cert     Problem List Patient Active Problem List   Diagnosis Date Noted  . Weight loss 09/04/2020  . Lung nodule 09/04/2020  . Paroxysmal atrial fibrillation with rapid ventricular response ([place]) 01/06/2020  . Spider veins of both lower extremities 05/09/2019  . HX: breast cancer 10/24/2014  . Routine health maintenance 03/30/2013  . PARESTHESIA 06/25/2010   Ruben Im,  PT 09/18/20 8:39 PM Phone: [phone] Fax: [phone] Alvera Singh 09/18/2020, 8:38 PM  [facility] [address], [place], [place], [address] Phone: [phone]   Fax:  [phone]  Name: Crystal [name] MRN: [id number] Date of Birth: 14-Dec-1950

## 2020-09-20 ENCOUNTER — Ambulatory Visit
Admission: RE | Admit: 2020-09-20 | Discharge: 2020-09-20 | Disposition: A | Discharge status: Home / Self Care | Payer: Medicare Other | Source: Ambulatory Visit | Attending: Internal Medicine | Admitting: Internal Medicine

## 2020-09-20 ENCOUNTER — Other Ambulatory Visit: Payer: Self-pay

## 2020-09-20 DIAGNOSIS — R911 Solitary pulmonary nodule: Secondary | ICD-10-CM

## 2020-10-04 ENCOUNTER — Other Ambulatory Visit: Payer: Self-pay

## 2020-10-04 ENCOUNTER — Ambulatory Visit: Payer: Medicare Other | Attending: Internal Medicine | Admitting: Physical Therapy

## 2020-10-04 DIAGNOSIS — M542 Cervicalgia: Secondary | ICD-10-CM | POA: Insufficient documentation

## 2020-10-04 DIAGNOSIS — R293 Abnormal posture: Secondary | ICD-10-CM | POA: Diagnosis present

## 2020-10-04 NOTE — Patient Instructions (Signed)
Access Code: [email or web address] URL: [email or web address] Date: 10/04/2020 Prepared by: Ruben Im  Exercises Shoulder W - External Rotation with Resistance - 1 x daily - 7 x weekly - 3 sets - 10 reps Seated Shoulder Diagonal Pulls with Resistance - 1 x daily - 7 x weekly - 3 sets - 10 reps Seated Shoulder Horizontal Abduction with Resistance - 1 x daily - 7 x weekly - 3 sets - 10 reps Seated Cervical Sidebending Stretch - 1 x daily - 7 x weekly - 3 sets - 10 reps Seated Cervical Rotation AROM - 3 x daily - 7 x weekly - 1 sets - 10 reps - 5 hold Seated Assisted Cervical Rotation with Towel - 1 x daily - 7 x weekly - 1 sets - 10 reps Seated Thoracic Lumbar Extension with Pectoralis Stretch - 1 x daily - 7 x weekly - 1 sets - 10 reps Supine Chest Stretch on Foam Roll - 1 x daily - 7 x weekly - 1 sets - 10 reps Supine Thoracic Mobilization Foam Roll Horizontal with Arm Stretch - 1 x daily - 7 x weekly - 1 sets - 10 reps Shoulder extension with resistance - Neutral - 1 x daily - 7 x weekly - 2 sets - 10 reps Standing Single Arm High Row with Resistance - 1 x daily - 7 x weekly - 2 sets - 10 reps Seated Shoulder Press with Resistance - 1 x daily - 7 x weekly - 1 sets - 10 reps SHAVE THE HEAD WITH YELLOW BAND - 1 x daily - 7 x weekly - 1 sets - 10 reps Standing Shoulder Flexion to 90 Degrees with Dumbbells - 1 x daily - 7 x weekly - 1 sets - 10 reps Scaption with Dumbbells - 1 x daily - 7 x weekly - 1 sets - 10 reps Shoulder Abduction with Dumbbells - Thumbs Up - 1 x daily - 7 x weekly - 1 sets - 10 reps

## 2020-10-04 NOTE — Therapy (Signed)
[facility] [address], [place], [place], [address] Phone: [phone]   Fax:  [phone]  Physical Therapy Treatment  Patient Details  Name: Crystal [name] MRN: [id number] Date of Birth: [birth year] Referring Provider (PT): Hoyt Koch, MD   Encounter Date: 10/04/2020   PT End of Session - 10/04/20 1320    Visit Number 8    Date for PT Re-Evaluation 12/11/20    Authorization Type medicare A/B    PT Start Time 1230    PT Stop Time 1312    PT Time Calculation (min) 42 min    Activity Tolerance Patient tolerated treatment well           Past Medical History:  Diagnosis Date  . BACK PAIN, LUMBAR   . Breast cancer of lower-outer quadrant of right female breast ([place]) 10/24/2014  . Breast cancer, right breast   . FIBROADENOMA, BREAST 2006  . New onset atrial fibrillation ([place]) 01/06/2020  . Palpitations 01/06/2020  . PARESTHESIA    toes  . PONV (postoperative nausea and vomiting)   . Radiation 11/23/14-12/25/14   right breast 42.72 gray, lumpectomy cavity boosted to 54.72 gray  . Routine health maintenance 03/30/2013   Colonoscopy - for colonoscopy (Sept '14)  Immunization  Tdap Oct '14   . Spider veins of both lower extremities 05/09/2019  . Wears glasses     Past Surgical History:  Procedure Laterality Date  . ATRIAL FIBRILLATION ABLATION N/A 05/25/2020   Procedure: ATRIAL FIBRILLATION ABLATION;  Surgeon: Constance Haw, MD;  Location: [place] [facility];  Service: Cardiovascular;  Laterality: N/A;  . atrophic vaginitis    . BREAST LUMPECTOMY WITH RADIOACTIVE SEED LOCALIZATION Right 10/18/2014   Procedure: BREAST LUMPECTOMY WITH RADIOACTIVE SEED LOCALIZATION;  Surgeon: Coralie Keens, MD;  Location: [place];  Service: General;  Laterality: Right;  . COLONOSCOPY  09/05/2013  . D & C and hysteroscopy  11/1998   for uterine polyp  . DILATATION & CURRETTAGE/HYSTEROSCOPY WITH  RESECTOCOPE N/A 05/30/2015   Procedure: DILATATION & CURETTAGE/HYSTEROSCOPY WITH RESECTOCOPE;  Surgeon: Princess Bruins, MD;  Location: [facility];  Service: Gynecology;  Laterality: N/A;  . DILATION AND CURETTAGE OF UTERUS    . Sterotactix needle biopsy  2000   breast calcification    There were no vitals filed for this visit.   Subjective Assessment - 10/04/20 1233    Subjective Feeling pretty good.  Got KT tape.  Only hurts with blow drying my hair.  Less popping.  Some pulling in levator muscle with left rotation.  I haven't had to use my heating pad in a while.    Patient Stated Goals sitting at night, rolling in bed, driving    Currently in Pain? Yes    Pain Score 1     Pain Location Neck    Pain Orientation Right    Pain Type Chronic pain                             [facility] Adult PT Treatment/Exercise - 10/04/20 0001      Neck Exercises: Seated   Other Seated Exercise discussed restart 2# shoulder flexion, scaption and abduction per HEP      Moist Heat Therapy   Number Minutes Moist Heat 3 Minutes    Moist Heat Location Cervical      Manual Therapy   Manual therapy comments Kinesiotape bil upper traps to medial  scapula    Soft tissue mobilization bil upper traps, right periscapular muscles            Trigger Point Dry Needling - 10/04/20 0001    Consent Given? Yes    Muscles Treated Upper Quadrant Teres major    Upper Trapezius Response Palpable increased muscle length;Twitch reponse elicited    Levator Scapulae Response Palpable increased muscle length    Rhomboids Response Palpable increased muscle length    Supraspinatus Response Palpable increased muscle length    Infraspinatus Response Palpable increased muscle length    Teres major Response Palpable increased muscle length                PT Education - 10/04/20 1320    Education Details 2# shoulder flex, scaption, abduction    Person(s) Educated Patient    Methods  Explanation;Demonstration;Handout    Comprehension Returned demonstration;Verbalized understanding            PT Short Term Goals - 09/18/20 2035      PT SHORT TERM GOAL #1   Title Ind with initial HEP    Status Achieved      PT SHORT TERM GOAL #2   Title The patient will report a 30% improvement in lower cervical, upper back pain level with ADLs    Status Achieved      PT SHORT TERM GOAL #3   Title Patient will have improved symmetrical cervical sidebending and rotation    Status Achieved             PT Long Term Goals - 09/18/20 2035      PT LONG TERM GOAL #1   Title The patient will be independent with a safe self progression of HEP    Time 12    Period Weeks    Status On-going    Target Date 12/11/20      PT LONG TERM GOAL #2   Title The patient will report a 70% improvement in pain across lower cervical/upper back region with home and work ADLs    Time 12    Period Weeks    Status On-going      PT LONG TERM GOAL #3   Title Pt will be able to demonstrate imporved upright posture due to greater strength and endurance of periscapular and back extensor strength    Time 12    Period Weeks    Status On-going      PT LONG TERM GOAL #4   Title FOTO functional outcome score improved from 55% functional to 66%    Status Achieved                 Plan - 10/04/20 1256    Clinical Impression Statement The patient has signficantly fewer tender points in right periscapular musculature and reports decreased pain in the evenings recently.  Good cervical ROM including symmetry with sidebending and rotation.  She is performing her current HEP with ease and expresses readiness with progression to UE strength training with light weights (has 2# at home).  Therapist monitoring response throughout treatment session.    Personal Factors and Comorbidities Age;Time since onset of injury/illness/exacerbation;Comorbidity 1;Past/Current Experience    Comorbidities osteopenia     Rehab Potential Excellent    PT Frequency Biweekly    PT Duration 12 weeks    PT Treatment/Interventions ADLs/Self Care Home Management;Biofeedback;Cryotherapy;Electrical Stimulation;Moist Heat;Traction;Neuromuscular re-education;Therapeutic activities;Therapeutic exercise;Patient/family education;Manual techniques;Dry needling;Passive range of motion;Taping    PT Next Visit Plan DN; manual therapy  right scapular region;  progressive peri scap muscle strengthening    PT Home Exercise Plan Access Code: [email or web address]           Patient will benefit from skilled therapeutic intervention in order to improve the following deficits and impairments:  Pain,Hypomobility,Decreased strength,Postural dysfunction,Decreased range of motion,Increased muscle spasms  Visit Diagnosis: Cervicalgia  Abnormal posture     Problem List Patient Active Problem List   Diagnosis Date Noted  . Weight loss 09/04/2020  . Lung nodule 09/04/2020  . Paroxysmal atrial fibrillation with rapid ventricular response ([place]) 01/06/2020  . Spider veins of both lower extremities 05/09/2019  . HX: breast cancer 10/24/2014  . Routine health maintenance 03/30/2013  . PARESTHESIA 06/25/2010   Ruben Im, PT 10/04/20 5:26 PM Phone: [phone] Fax: [phone] Alvera Singh 10/04/2020, 5:25 PM  [facility] [address] [place], [place], [address] Phone: [phone]   Fax:  [phone]  Name: WILLETTA YORK MRN: [id number] Date of Birth: Oct 14, 1950

## 2020-10-25 ENCOUNTER — Other Ambulatory Visit: Payer: Self-pay

## 2020-10-25 ENCOUNTER — Ambulatory Visit: Payer: Medicare Other | Admitting: Physical Therapy

## 2020-10-25 DIAGNOSIS — M542 Cervicalgia: Secondary | ICD-10-CM

## 2020-10-25 DIAGNOSIS — R293 Abnormal posture: Secondary | ICD-10-CM

## 2020-10-25 NOTE — Therapy (Signed)
[facility] [address], [place], [place], [address] Phone: [phone]   Fax:  [phone]  Physical Therapy Treatment  Patient Details  Name: Crystal [name] MRN: [id number] Date of Birth: 1951-03-26 Referring Provider (PT): Hoyt Koch, MD   Encounter Date: 10/25/2020   PT End of Session - 10/25/20 0836    Visit Number 9    Date for PT Re-Evaluation 12/11/20    Authorization Type medicare A/B    PT Start Time 0801   DN heat   PT Stop Time 0835    PT Time Calculation (min) 34 min    Activity Tolerance Patient tolerated treatment well           Past Medical History:  Diagnosis Date  . BACK PAIN, LUMBAR   . Breast cancer of lower-outer quadrant of right female breast ([place]) 10/24/2014  . Breast cancer, right breast   . FIBROADENOMA, BREAST 2006  . New onset atrial fibrillation ([place]) 01/06/2020  . Palpitations 01/06/2020  . PARESTHESIA    toes  . PONV (postoperative nausea and vomiting)   . Radiation 11/23/14-12/25/14   right breast 42.72 gray, lumpectomy cavity boosted to 54.72 gray  . Routine health maintenance 03/30/2013   Colonoscopy - for colonoscopy (Sept '14)  Immunization  Tdap Oct '14   . Spider veins of both lower extremities 05/09/2019  . Wears glasses     Past Surgical History:  Procedure Laterality Date  . ATRIAL FIBRILLATION ABLATION N/A 05/25/2020   Procedure: ATRIAL FIBRILLATION ABLATION;  Surgeon: Constance Haw, MD;  Location: [facility];  Service: Cardiovascular;  Laterality: N/A;  . atrophic vaginitis    . BREAST LUMPECTOMY WITH RADIOACTIVE SEED LOCALIZATION Right 10/18/2014   Procedure: BREAST LUMPECTOMY WITH RADIOACTIVE SEED LOCALIZATION;  Surgeon: Coralie Keens, MD;  Location: [facility];  Service: General;  Laterality: Right;  . COLONOSCOPY  09/05/2013  . D & C and hysteroscopy  11/1998   for uterine polyp  . DILATATION & CURRETTAGE/HYSTEROSCOPY  WITH RESECTOCOPE N/A 05/30/2015   Procedure: DILATATION & CURETTAGE/HYSTEROSCOPY WITH RESECTOCOPE;  Surgeon: Princess Bruins, MD;  Location: [facility];  Service: Gynecology;  Laterality: N/A;  . DILATION AND CURETTAGE OF UTERUS    . Sterotactix needle biopsy  2000   breast calcification    There were no vitals filed for this visit.   Subjective Assessment - 10/25/20 0801    Subjective Feeling very well.  Drying my hair is fine.  Only a tinge every now and then.  Left rotation is good.  Just a little tightness in right levator.  I've slacked off on my exercises.    Patient Stated Goals sitting at night, rolling in bed, driving    Currently in Pain? No/denies    Pain Score 0-No pain    Pain Orientation Right              [facility] PT Assessment - 10/25/20 0001      AROM   Overall AROM Comments shoulder bil WNL    Cervical - Right Rotation 50    Cervical - Left Rotation 50                         [facility] Adult PT Treatment/Exercise - 10/25/20 0001      Neck Exercises: Standing   Other Standing Exercises counter push ups 10x    Other Standing Exercises snow angels; facing wall bil UE slides with  lift off at the top      Moist Heat Therapy   Number Minutes Moist Heat 5 Minutes    Moist Heat Location Cervical      Manual Therapy   Soft tissue mobilization bil upper traps, right periscapular muscles            Trigger Point Dry Needling - 10/25/20 0001    Consent Given? Yes    Muscles Treated Upper Quadrant Teres major    Other Dry Needling right only    Upper Trapezius Response Palpable increased muscle length;Twitch reponse elicited    Levator Scapulae Response Palpable increased muscle length    Supraspinatus Response Palpable increased muscle length    Infraspinatus Response Palpable increased muscle length                PT Education - 10/25/20 0829    Education Details wall snow angels; wall UE flexion; counter push ups    Person(s) Educated  Patient    Methods Explanation;Demonstration;Verbal cues    Comprehension Returned demonstration;Verbalized understanding            PT Short Term Goals - 09/18/20 2035      PT SHORT TERM GOAL #1   Title Ind with initial HEP    Status Achieved      PT SHORT TERM GOAL #2   Title The patient will report a 30% improvement in lower cervical, upper back pain level with ADLs    Status Achieved      PT SHORT TERM GOAL #3   Title Patient will have improved symmetrical cervical sidebending and rotation    Status Achieved             PT Long Term Goals - 09/18/20 2035      PT LONG TERM GOAL #1   Title The patient will be independent with a safe self progression of HEP    Time 12    Period Weeks    Status On-going    Target Date 12/11/20      PT LONG TERM GOAL #2   Title The patient will report a 70% improvement in pain across lower cervical/upper back region with home and work ADLs    Time 12    Period Weeks    Status On-going      PT LONG TERM GOAL #3   Title Pt will be able to demonstrate imporved upright posture due to greater strength and endurance of periscapular and back extensor strength    Time 12    Period Weeks    Status On-going      PT LONG TERM GOAL #4   Title FOTO functional outcome score improved from 55% functional to 66%    Status Achieved                 Plan - 10/25/20 0826    Clinical Impression Statement The patient has full and symmetric cervical rotation ROM without pain.  Much improved soft tissue mobility with only a few tender points in upper traps, levator and infraspinatus.  She has met the majority of goals and anticipate readiness for discharge in 1-2 visits.  She has had a few set backs/exacerbations in the past and would like to ensure she is still doing well over the next few weeks before discharge from PT.    Personal Factors and Comorbidities Age;Time since onset of injury/illness/exacerbation;Comorbidity 1;Past/Current  Experience    Comorbidities osteopenia    Rehab Potential Excellent  PT Frequency Biweekly    PT Duration 12 weeks    PT Treatment/Interventions ADLs/Self Care Home Management;Biofeedback;Cryotherapy;Electrical Stimulation;Moist Heat;Traction;Neuromuscular re-education;Therapeutic activities;Therapeutic exercise;Patient/family education;Manual techniques;Dry needling;Passive range of motion;Taping    PT Next Visit Plan anticipate discharge in 1-2 visits;  DN; manual therapy right scapular region;  progressive peri scap muscle strengthening    PT Home Exercise Plan Access Code: [email or web address]           Patient will benefit from skilled therapeutic intervention in order to improve the following deficits and impairments:  Pain,Hypomobility,Decreased strength,Postural dysfunction,Decreased range of motion,Increased muscle spasms  Visit Diagnosis: Cervicalgia  Abnormal posture     Problem List Patient Active Problem List   Diagnosis Date Noted  . Weight loss 09/04/2020  . Lung nodule 09/04/2020  . Paroxysmal atrial fibrillation with rapid ventricular response ([place]) 01/06/2020  . Spider veins of both lower extremities 05/09/2019  . HX: breast cancer 10/24/2014  . Routine health maintenance 03/30/2013  . PARESTHESIA 06/25/2010   Ruben Im, PT 10/25/20 6:42 PM Phone: [phone] Fax: [phone] Alvera Singh 10/25/2020, 6:41 PM  [facility] [address], [place], [place], [address] Phone: [phone]   Fax:  [phone]  Name: Crystal [name] MRN: [id number] Date of Birth: 1950/10/11

## 2020-10-25 NOTE — Patient Instructions (Signed)
Access Code: [email or web address] URL: [email or web address] Date: 10/25/2020 Prepared by: Ruben Im  Exercises Shoulder W - External Rotation with Resistance - 1 x daily - 7 x weekly - 3 sets - 10 reps Seated Shoulder Diagonal Pulls with Resistance - 1 x daily - 7 x weekly - 3 sets - 10 reps Seated Shoulder Horizontal Abduction with Resistance - 1 x daily - 7 x weekly - 3 sets - 10 reps Seated Cervical Sidebending Stretch - 1 x daily - 7 x weekly - 3 sets - 10 reps Seated Cervical Rotation AROM - 3 x daily - 7 x weekly - 1 sets - 10 reps - 5 hold Seated Assisted Cervical Rotation with Towel - 1 x daily - 7 x weekly - 1 sets - 10 reps Seated Thoracic Lumbar Extension with Pectoralis Stretch - 1 x daily - 7 x weekly - 1 sets - 10 reps Supine Chest Stretch on Foam Roll - 1 x daily - 7 x weekly - 1 sets - 10 reps Supine Thoracic Mobilization Foam Roll Horizontal with Arm Stretch - 1 x daily - 7 x weekly - 1 sets - 10 reps Shoulder extension with resistance - Neutral - 1 x daily - 7 x weekly - 2 sets - 10 reps Standing Single Arm High Row with Resistance - 1 x daily - 7 x weekly - 2 sets - 10 reps Seated Shoulder Press with Resistance - 1 x daily - 7 x weekly - 1 sets - 10 reps SHAVE THE HEAD WITH YELLOW BAND - 1 x daily - 7 x weekly - 1 sets - 10 reps Standing Shoulder Flexion to 90 Degrees with Dumbbells - 1 x daily - 7 x weekly - 1 sets - 10 reps Scaption with Dumbbells - 1 x daily - 7 x weekly - 1 sets - 10 reps Shoulder Abduction with Dumbbells - Thumbs Up - 1 x daily - 7 x weekly - 1 sets - 10 reps Wall Angels - 1 x daily - 7 x weekly - 1 sets - 10 reps Standing Lumbar Extension at Wall - 1 x daily - 7 x weekly - 1 sets - 10 reps Push-Up on Counter - 1 x daily - 7 x weekly - 2 sets - 10 reps

## 2020-10-26 ENCOUNTER — Other Ambulatory Visit: Payer: Self-pay

## 2020-10-26 ENCOUNTER — Ambulatory Visit (INDEPENDENT_AMBULATORY_CARE_PROVIDER_SITE_OTHER): Payer: Medicare Other | Admitting: Endocrinology

## 2020-10-26 DIAGNOSIS — E0590 Thyrotoxicosis, unspecified without thyrotoxic crisis or storm: Secondary | ICD-10-CM

## 2020-10-26 MED ORDER — METHIMAZOLE 10 MG PO TABS: 10.0000 mg | ORAL_TABLET | Freq: Two times a day (BID) | ORAL | 3 refills | Status: DC

## 2020-10-26 NOTE — Patient Instructions (Addendum)
I have sent a prescription to your pharmacy, to slow the thyroid.   If ever you have fever while taking methimazole, stop it and call us, even if the reason is obvious, because of the risk of a rare side-effect.   It is best to never miss the medication.  However, if you do miss it, next best is to double up the next time.   Please come back for a follow-up appointment in 1 month.       Hyperthyroidism  Hyperthyroidism is when the thyroid gland is too active (overactive). The thyroid gland is a small gland located in the lower front part of the neck, just in front of the windpipe (trachea). This gland makes hormones that help control how the body uses food for energy (metabolism) as well as how the heart and brain function. These hormones also play a role in keeping your bones strong. When the thyroid is overactive, it produces too much of a hormone called thyroxine. What are the causes? This condition may be caused by:  Graves' disease. This is a disorder in which the body's disease-fighting system (immune system) attacks the thyroid gland. This is the most common cause.  Inflammation of the thyroid gland.  A tumor in the thyroid gland.  Use of certain medicines, including: ? Prescription thyroid hormone replacement. ? Herbal supplements that mimic thyroid hormones. ? Amiodarone therapy.  Solid or fluid-filled lumps within your thyroid gland (thyroid nodules).  Taking in a large amount of iodine from foods or medicines. What increases the risk? You are more likely to develop this condition if:  You are female.  You have a family history of thyroid conditions.  You smoke tobacco.  You use a medicine called lithium.  You take medicines that affect the immune system (immunosuppressants). What are the signs or symptoms? Symptoms of this condition include:  Nervousness.  Inability to tolerate heat.  Unexplained weight loss.  Diarrhea.  Change in the texture of hair or  skin.  Heart skipping beats or making extra beats.  Rapid heart rate.  Loss of menstruation.  Shaky hands.  Fatigue.  Restlessness.  Sleep problems.  Enlarged thyroid gland or a lump in the thyroid (nodule). You may also have symptoms of Graves' disease, which may include:  Protruding eyes.  Dry eyes.  Red or swollen eyes.  Problems with vision. How is this diagnosed? This condition may be diagnosed based on:  Your symptoms and medical history.  A physical exam.  Blood tests.  Thyroid ultrasound. This test involves using sound waves to produce images of the thyroid gland.  A thyroid scan. A radioactive substance is injected into a vein, and images show how much iodine is present in the thyroid.  Radioactive iodine uptake test (RAIU). A small amount of radioactive iodine is given by mouth to see how much iodine the thyroid absorbs after a certain amount of time. How is this treated? Treatment depends on the cause and severity of the condition. Treatment may include:  Medicines to reduce the amount of thyroid hormone your body makes.  Radioactive iodine treatment (radioiodine therapy). This involves swallowing a small dose of radioactive iodine, in capsule or liquid form, to kill thyroid cells.  Surgery to remove part or all of your thyroid gland. You may need to take thyroid hormone replacement medicine for the rest of your life after thyroid surgery.  Medicines to help manage your symptoms. Follow these instructions at home:  Take over-the-counter and prescription medicines only as  told by your health care provider.  Do not use any products that contain nicotine or tobacco, such as cigarettes and e-cigarettes. If you need help quitting, ask your health care provider.  Follow any instructions from your health care provider about diet. You may be instructed to limit foods that contain iodine.  Keep all follow-up visits as told by your health care provider. This  is important. ? You will need to have blood tests regularly so that your health care provider can monitor your condition.   Contact a health care provider if:  Your symptoms do not get better with treatment.  You have a fever.  You are taking thyroid hormone replacement medicine and you: ? Have symptoms of depression. ? Feel like you are tired all the time. ? Gain weight. Get help right away if:  You have chest pain.  You have decreased alertness or a change in your awareness.  You have abdominal pain.  You feel dizzy.  You have a rapid heartbeat.  You have an irregular heartbeat.  You have difficulty breathing. Summary  The thyroid gland is a small gland located in the lower front part of the neck, just in front of the windpipe (trachea).  Hyperthyroidism is when the thyroid gland is too active (overactive) and produces too much of a hormone called thyroxine.  The most common cause is Graves' disease, a disorder in which your immune system attacks the thyroid gland.  Hyperthyroidism can cause various symptoms, such as unexplained weight loss, nervousness, inability to tolerate heat, or changes in your heartbeat.  Treatment may include medicine to reduce the amount of thyroid hormone your body makes, radioiodine therapy, surgery, or medicines to manage symptoms. This information is not intended to replace advice given to you by your health care provider. Make sure you discuss any questions you have with your health care provider. Document Revised: 03/01/2020 Document Reviewed: 03/01/2020 Elsevier Patient Education  2021 [employer].

## 2020-10-26 NOTE — Progress Notes (Signed)
Subjective:    Patient ID: Crystal [name], female    DOB: 11-Mar-1951, 70 y.o.   MRN: [id number]  HPI Pt is referred by Dr Sharlet Salina, for hyperthyroidism.  Pt reports he was dx'ed with hyperthyroidism in 2021.  she has never been on therapy for this.  she has never had XRT to the anterior neck, or thyroid surgery.  she has never had thyroid imaging.  she does not consume kelp or any other non-prescribed thyroid medication.  she has never been on amiodarone.  She reports palpitations, anxiety, and 15 lb weight loss.  She has AF.   Past Medical History:  Diagnosis Date  . BACK PAIN, LUMBAR   . Breast cancer of lower-outer quadrant of right female breast ([place]) 10/24/2014  . Breast cancer, right breast   . FIBROADENOMA, BREAST 2006  . New onset atrial fibrillation ([place]) 01/06/2020  . Palpitations 01/06/2020  . PARESTHESIA    toes  . PONV (postoperative nausea and vomiting)   . Radiation 11/23/14-12/25/14   right breast 42.72 gray, lumpectomy cavity boosted to 54.72 gray  . Routine health maintenance 03/30/2013   Colonoscopy - for colonoscopy (Sept '14)  Immunization  Tdap Oct '14   . Spider veins of both lower extremities 05/09/2019  . Wears glasses     Past Surgical History:  Procedure Laterality Date  . ATRIAL FIBRILLATION ABLATION N/A 05/25/2020   Procedure: ATRIAL FIBRILLATION ABLATION;  Surgeon: Constance Haw, MD;  Location: [facility];  Service: Cardiovascular;  Laterality: N/A;  . atrophic vaginitis    . BREAST LUMPECTOMY WITH RADIOACTIVE SEED LOCALIZATION Right 10/18/2014   Procedure: BREAST LUMPECTOMY WITH RADIOACTIVE SEED LOCALIZATION;  Surgeon: Coralie Keens, MD;  Location: [facility];  Service: General;  Laterality: Right;  . COLONOSCOPY  09/05/2013  . D & C and hysteroscopy  11/1998   for uterine polyp  . DILATATION & CURRETTAGE/HYSTEROSCOPY WITH RESECTOCOPE N/A 05/30/2015   Procedure: DILATATION & CURETTAGE/HYSTEROSCOPY WITH RESECTOCOPE;  Surgeon:  Princess Bruins, MD;  Location: [facility];  Service: Gynecology;  Laterality: N/A;  . DILATION AND CURETTAGE OF UTERUS    . Sterotactix needle biopsy  2000   breast calcification    Social History   Socioeconomic History  . Marital status: Married    Spouse name: howard  . Number of children: 2  . Years of education: 38  . Highest education level: Not on file  Occupational History  . Occupation: PROPERTY MGR    Employer: [employer]  Tobacco Use  . Smoking status: Never Smoker  . Smokeless tobacco: Never Used  Vaping Use  . Vaping Use: Never used  Substance and Sexual Activity  . Alcohol use: Yes    Alcohol/week: 5.0 standard drinks    Types: 5 Standard drinks or equivalent per week    Comment: 2 beers/week  . Drug use: No  . Sexual activity: Yes    Partners: Male    Comment: 1st intercourse- 31, partners-  married-  46 yrs   Other Topics Concern  . Not on file  Social History Narrative   HSG, [place], Married '78,  1 son '81, 1 daughter '84, 1 grandson '12.    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 ACP - discussed with patient.    Refer - [email or web address]   Highest level of education:  BS   Right handed   Two story home   Social Determinants of Health  Financial Resource Strain: Not on file  Food Insecurity: Not on file  Transportation Needs: Not on file  Physical Activity: Not on file  Stress: Not on file  Social Connections: Not on file  Intimate Partner Violence: Not on file    Current Outpatient Medications on File Prior to Visit  Medication Sig Dispense Refill  . acetaminophen (TYLENOL) 325 MG tablet Take 325 mg by mouth as needed for moderate pain or headache.    Marland Kitchen apixaban (ELIQUIS) 5 MG TABS tablet Take 1 tablet (5 mg total) by mouth 2 (two) times daily. 90 tablet 1  . Calcium Carbonate-Vitamin D 600-400 MG-UNIT tablet Take 1 tablet by mouth daily.    . cetirizine (ZYRTEC) 10 MG tablet Take  10 mg by mouth daily.    . Cholecalciferol (VITAMIN D3) 1000 UNITS CAPS Take 1,000 Units by mouth daily.    . metoprolol succinate (TOPROL-XL) 25 MG 24 hr tablet Take 1 tablet (25 mg total) by mouth in the morning and at bedtime. 60 tablet 3  . Multiple Vitamin (MULTIVITAMIN) tablet Take 1 tablet by mouth daily.    Vladimir Faster Glycol-Propyl Glycol (SYSTANE OP) Place 1 drop into both eyes daily as needed (For dryness.).     Marland Kitchen Probiotic Product (FORTIFY DAILY PROBIOTIC PO) Take 1 capsule by mouth daily.     Marland Kitchen trimethoprim (TRIMPEX) 100 MG tablet Take 100 mg by mouth at bedtime.      No current facility-administered medications on file prior to visit.    No Known Allergies  Family History  Problem Relation Age of Onset  . Hypothyroidism Mother   . GER disease Mother   . Osteoporosis Mother   . Hypertension Mother   . Cancer Mother        hodgkins lympoma, uterine cancer  . Arrhythmia Mother   . Hyperlipidemia Father   . Hypertension Father   . Diabetes Father   . Coronary artery disease Father   . Coronary artery disease Maternal Grandfather   . COPD Neg Hx   . Asthma Neg Hx   . Colon cancer Neg Hx   . Esophageal cancer Neg Hx   . Rectal cancer Neg Hx   . Stomach cancer Neg Hx     BP 120/68 (BP Location: Right Arm, Patient Position: Sitting, Cuff Size: Normal)   Pulse 75   Ht 5\' 10"  (1.778 m)   Wt 145 lb 3.2 oz (65.9 kg)   SpO2 98%   BMI 20.83 kg/m    Review of Systems denies sob, excessive diaphoresis, tremor, and heat intolerance.      Objective:   Physical Exam VS: see vs page GEN: no distress HEAD: head: no deformity eyes: no periorbital swelling, no proptosis external nose and ears are normal NECK: supple, thyroid is not enlarged.   CHEST WALL: no deformity LUNGS: clear to auscultation CV: reg rate and rhythm, no murmur.  MUSCULOSKELETAL: gait is normal and steady EXTEMITIES: no deformity.  no leg edema.   NEURO:  readily moves all 4's.  sensation is  intact to touch on all 4's.  No tremor.  SKIN:  Normal texture and temperature.  No rash or suspicious lesion is visible.  Not diaphoretic.   NODES:  None palpable at the neck PSYCH: alert, well-oriented.  Does not appear anxious nor depressed.   Lab Results  Component Value Date   TSH <0.01 Repeated and verified X2. (L) 09/04/2020   I have reviewed outside records, and summarized: Pt was noted to  have low TSH, and referred here.  She had ablation in 2021 for AF, but SVT recurred.      Assessment & Plan:  Hyperthyroidism, new to me.  We discussed rx options AF: because of the high risk of this situation, I advised thionamide rx, for prompt improvement.    Patient Instructions   I have sent a prescription to your pharmacy, to slow the thyroid.   If ever you have fever while taking methimazole, stop it and call us, even if the reason is obvious, because of the risk of a rare side-effect.   It is best to never miss the medication.  However, if you do miss it, next best is to double up the next time.   Please come back for a follow-up appointment in 1 month.       Hyperthyroidism  Hyperthyroidism is when the thyroid gland is too active (overactive). The thyroid gland is a small gland located in the lower front part of the neck, just in front of the windpipe (trachea). This gland makes hormones that help control how the body uses food for energy (metabolism) as well as how the heart and brain function. These hormones also play a role in keeping your bones strong. When the thyroid is overactive, it produces too much of a hormone called thyroxine. What are the causes? This condition may be caused by:  Graves' disease. This is a disorder in which the body's disease-fighting system (immune system) attacks the thyroid gland. This is the most common cause.  Inflammation of the thyroid gland.  A tumor in the thyroid gland.  Use of certain medicines, including: ? Prescription thyroid  hormone replacement. ? Herbal supplements that mimic thyroid hormones. ? Amiodarone therapy.  Solid or fluid-filled lumps within your thyroid gland (thyroid nodules).  Taking in a large amount of iodine from foods or medicines. What increases the risk? You are more likely to develop this condition if:  You are female.  You have a family history of thyroid conditions.  You smoke tobacco.  You use a medicine called lithium.  You take medicines that affect the immune system (immunosuppressants). What are the signs or symptoms? Symptoms of this condition include:  Nervousness.  Inability to tolerate heat.  Unexplained weight loss.  Diarrhea.  Change in the texture of hair or skin.  Heart skipping beats or making extra beats.  Rapid heart rate.  Loss of menstruation.  Shaky hands.  Fatigue.  Restlessness.  Sleep problems.  Enlarged thyroid gland or a lump in the thyroid (nodule). You may also have symptoms of Graves' disease, which may include:  Protruding eyes.  Dry eyes.  Red or swollen eyes.  Problems with vision. How is this diagnosed? This condition may be diagnosed based on:  Your symptoms and medical history.  A physical exam.  Blood tests.  Thyroid ultrasound. This test involves using sound waves to produce images of the thyroid gland.  A thyroid scan. A radioactive substance is injected into a vein, and images show how much iodine is present in the thyroid.  Radioactive iodine uptake test (RAIU). A small amount of radioactive iodine is given by mouth to see how much iodine the thyroid absorbs after a certain amount of time. How is this treated? Treatment depends on the cause and severity of the condition. Treatment may include:  Medicines to reduce the amount of thyroid hormone your body makes.  Radioactive iodine treatment (radioiodine therapy). This involves swallowing a small dose of radioactive iodine,  in capsule or liquid form, to  kill thyroid cells.  Surgery to remove part or all of your thyroid gland. You may need to take thyroid hormone replacement medicine for the rest of your life after thyroid surgery.  Medicines to help manage your symptoms. Follow these instructions at home:  Take over-the-counter and prescription medicines only as told by your health care provider.  Do not use any products that contain nicotine or tobacco, such as cigarettes and e-cigarettes. If you need help quitting, ask your health care provider.  Follow any instructions from your health care provider about diet. You may be instructed to limit foods that contain iodine.  Keep all follow-up visits as told by your health care provider. This is important. ? You will need to have blood tests regularly so that your health care provider can monitor your condition.   Contact a health care provider if:  Your symptoms do not get better with treatment.  You have a fever.  You are taking thyroid hormone replacement medicine and you: ? Have symptoms of depression. ? Feel like you are tired all the time. ? Gain weight. Get help right away if:  You have chest pain.  You have decreased alertness or a change in your awareness.  You have abdominal pain.  You feel dizzy.  You have a rapid heartbeat.  You have an irregular heartbeat.  You have difficulty breathing. Summary  The thyroid gland is a small gland located in the lower front part of the neck, just in front of the windpipe (trachea).  Hyperthyroidism is when the thyroid gland is too active (overactive) and produces too much of a hormone called thyroxine.  The most common cause is Graves' disease, a disorder in which your immune system attacks the thyroid gland.  Hyperthyroidism can cause various symptoms, such as unexplained weight loss, nervousness, inability to tolerate heat, or changes in your heartbeat.  Treatment may include medicine to reduce the amount of thyroid  hormone your body makes, radioiodine therapy, surgery, or medicines to manage symptoms. This information is not intended to replace advice given to you by your health care provider. Make sure you discuss any questions you have with your health care provider. Document Revised: 03/01/2020 Document Reviewed: 03/01/2020 Elsevier Patient Education  2021 [employer].

## 2020-10-28 DIAGNOSIS — E0590 Thyrotoxicosis, unspecified without thyrotoxic crisis or storm: Secondary | ICD-10-CM | POA: Insufficient documentation

## 2020-11-06 ENCOUNTER — Ambulatory Visit: Payer: Medicare Other | Attending: Internal Medicine | Admitting: Physical Therapy

## 2020-11-06 ENCOUNTER — Other Ambulatory Visit: Payer: Self-pay

## 2020-11-06 DIAGNOSIS — R293 Abnormal posture: Secondary | ICD-10-CM | POA: Insufficient documentation

## 2020-11-06 DIAGNOSIS — M542 Cervicalgia: Secondary | ICD-10-CM | POA: Diagnosis not present

## 2020-11-06 NOTE — Therapy (Signed)
[facility] [address], [place], [place], [address] Phone: [phone]   Fax:  [phone]  Physical Therapy Treatment  Patient Details  Name: Crystal [name] MRN: [id number] Date of Birth: 06-25-1951 Referring Provider (PT): Hoyt Koch, MD Progress Note Reporting Period 07/07/20 to 11/06/20  See note below for Objective Data and Assessment of Progress/Goals.       Encounter Date: 11/06/2020   PT End of Session - 11/06/20 1735    Visit Number 10    Date for PT Re-Evaluation 12/11/20    Authorization Type medicare A/B    PT Start Time 0800    PT Stop Time 0835   DN;heat   PT Time Calculation (min) 35 min    Activity Tolerance Patient tolerated treatment well           Past Medical History:  Diagnosis Date  . BACK PAIN, LUMBAR   . Breast cancer of lower-outer quadrant of right female breast ([place]) 10/24/2014  . Breast cancer, right breast   . FIBROADENOMA, BREAST 2006  . New onset atrial fibrillation ([place]) 01/06/2020  . Palpitations 01/06/2020  . PARESTHESIA    toes  . PONV (postoperative nausea and vomiting)   . Radiation 11/23/14-12/25/14   right breast 42.72 gray, lumpectomy cavity boosted to 54.72 gray  . Routine health maintenance 03/30/2013   Colonoscopy - for colonoscopy (Sept '14)  Immunization  Tdap Oct '14   . Spider veins of both lower extremities 05/09/2019  . Wears glasses     Past Surgical History:  Procedure Laterality Date  . ATRIAL FIBRILLATION ABLATION N/A 05/25/2020   Procedure: ATRIAL FIBRILLATION ABLATION;  Surgeon: Constance Haw, MD;  Location: [facility];  Service: Cardiovascular;  Laterality: N/A;  . atrophic vaginitis    . BREAST LUMPECTOMY WITH RADIOACTIVE SEED LOCALIZATION Right 10/18/2014   Procedure: BREAST LUMPECTOMY WITH RADIOACTIVE SEED LOCALIZATION;  Surgeon: Coralie Keens, MD;  Location: [facility];  Service: General;  Laterality:  Right;  . COLONOSCOPY  09/05/2013  . D & C and hysteroscopy  11/1998   for uterine polyp  . DILATATION & CURRETTAGE/HYSTEROSCOPY WITH RESECTOCOPE N/A 05/30/2015   Procedure: DILATATION & CURETTAGE/HYSTEROSCOPY WITH RESECTOCOPE;  Surgeon: Princess Bruins, MD;  Location: [facility];  Service: Gynecology;  Laterality: N/A;  . DILATION AND CURETTAGE OF UTERUS    . Sterotactix needle biopsy  2000   breast calcification    There were no vitals filed for this visit.   Subjective Assessment - 11/06/20 0802    Subjective Feeling pretty good.  I didn't even notice anything fixing my hair.  Turning to the left some soreness medial scapula with turning left and squeezing shoulder blades.    Patient Stated Goals sitting at night, rolling in bed, driving    Currently in Pain? No/denies    Pain Score 0-No pain   sore with turning to the left   Pain Location Neck    Pain Type Chronic pain              [facility] PT Assessment - 11/06/20 0001      Observation/Other Assessments   Focus on Therapeutic Outcomes (FOTO)  87%      AROM   Overall AROM Comments shoulder bil WNL    Cervical Flexion 60    Cervical Extension 60    Cervical - Right Side Bend 48    Cervical - Left Side Bend 42    Cervical - Right  Rotation 50    Cervical - Left Rotation 50                         [facility] Adult PT Treatment/Exercise - 11/06/20 0001      Neck Exercises: Seated   Other Seated Exercise no questions regarding HEP      Moist Heat Therapy   Moist Heat Location Cervical      Manual Therapy   Soft tissue mobilization bil upper traps, supraspinatus, levator scap, rhomboids            Trigger Point Dry Needling - 11/06/20 0001    Consent Given? Yes    Muscles Treated Upper Quadrant Teres major    Other Dry Needling bil upper traps and mostly left    Upper Trapezius Response Palpable increased muscle length;Twitch reponse elicited    Levator Scapulae Response Palpable increased muscle length     Rhomboids Response Palpable increased muscle length    Supraspinatus Response Palpable increased muscle length                  PT Short Term Goals - 09/18/20 2035      PT SHORT TERM GOAL #1   Title Ind with initial HEP    Status Achieved      PT SHORT TERM GOAL #2   Title The patient will report a 30% improvement in lower cervical, upper back pain level with ADLs    Status Achieved      PT SHORT TERM GOAL #3   Title Patient will have improved symmetrical cervical sidebending and rotation    Status Achieved             PT Long Term Goals - 11/06/20 1740      PT LONG TERM GOAL #1   Title The patient will be independent with a safe self progression of HEP    Time 12    Period Weeks    Status On-going      PT LONG TERM GOAL #2   Title The patient will report a 70% improvement in pain across lower cervical/upper back region with home and work ADLs    Time 12    Period Weeks    Status On-going      PT LONG TERM GOAL #3   Title Pt will be able to demonstrate imporved upright posture due to greater strength and endurance of periscapular and back extensor strength    Time 12    Period Weeks    Status On-going      PT LONG TERM GOAL #4   Title FOTO functional outcome score improved from 55% functional to 66%    Status Achieved                 Plan - 11/06/20 1735    Clinical Impression Statement The patient reports decreasing overall pain with only minor discomfort with cervical rotation.  Signficant improvement in cervical flexion ROM and other planes of movement are symmetrical.  Her FOTO score has dramatically improved to 87%.  Anticipate discharge in 1-2 visits. Therapist monitoring response throughout treatment session.    Personal Factors and Comorbidities Age;Time since onset of injury/illness/exacerbation;Comorbidity 1;Past/Current Experience    Rehab Potential Excellent    PT Frequency Biweekly    PT Treatment/Interventions ADLs/Self Care Home  Management;Biofeedback;Cryotherapy;Electrical Stimulation;Moist Heat;Traction;Neuromuscular re-education;Therapeutic activities;Therapeutic exercise;Patient/family education;Manual techniques;Dry needling;Passive range of motion;Taping    PT Next Visit Plan anticipate discharge in 1-2 visits;  DN; manual therapy right scapular region;  progressive peri scap muscle strengthening    PT Home Exercise Plan Access Code: [email or web address]           Patient will benefit from skilled therapeutic intervention in order to improve the following deficits and impairments:  Pain,Hypomobility,Decreased strength,Postural dysfunction,Decreased range of motion,Increased muscle spasms  Visit Diagnosis: Cervicalgia  Abnormal posture     Problem List Patient Active Problem List   Diagnosis Date Noted  . Hyperthyroidism 10/28/2020  . Weight loss 09/04/2020  . Lung nodule 09/04/2020  . Paroxysmal atrial fibrillation with rapid ventricular response ([place]) 01/06/2020  . Spider veins of both lower extremities 05/09/2019  . HX: breast cancer 10/24/2014  . Routine health maintenance 03/30/2013  . PARESTHESIA 06/25/2010   Ruben Im, PT 11/06/20 5:42 PM Phone: [phone] Fax: [phone] Alvera Singh 11/06/2020, 5:41 PM  [facility] [address], [place], [place], [address] Phone: [phone]   Fax:  [phone]  Name: Crystal [name] MRN: [id number] Date of Birth: 06/12/1951

## 2020-11-20 ENCOUNTER — Other Ambulatory Visit: Payer: Self-pay

## 2020-11-20 ENCOUNTER — Ambulatory Visit: Payer: Medicare Other | Admitting: Physical Therapy

## 2020-11-20 DIAGNOSIS — M542 Cervicalgia: Secondary | ICD-10-CM | POA: Diagnosis not present

## 2020-11-20 DIAGNOSIS — R293 Abnormal posture: Secondary | ICD-10-CM

## 2020-11-20 NOTE — Patient Instructions (Signed)
Access Code: [email or web address] URL: [email or web address] Date: 11/20/2020 Prepared by: Ruben Im  Exercises Shoulder W - External Rotation with Resistance - 1 x daily - 7 x weekly - 3 sets - 10 reps Seated Shoulder Diagonal Pulls with Resistance - 1 x daily - 7 x weekly - 3 sets - 10 reps Seated Shoulder Horizontal Abduction with Resistance - 1 x daily - 7 x weekly - 3 sets - 10 reps Seated Cervical Sidebending Stretch - 1 x daily - 7 x weekly - 3 sets - 10 reps Seated Cervical Rotation AROM - 3 x daily - 7 x weekly - 1 sets - 10 reps - 5 hold Seated Assisted Cervical Rotation with Towel - 1 x daily - 7 x weekly - 1 sets - 10 reps Seated Thoracic Lumbar Extension with Pectoralis Stretch - 1 x daily - 7 x weekly - 1 sets - 10 reps Supine Chest Stretch on Foam Roll - 1 x daily - 7 x weekly - 1 sets - 10 reps Supine Thoracic Mobilization Foam Roll Horizontal with Arm Stretch - 1 x daily - 7 x weekly - 1 sets - 10 reps Shoulder extension with resistance - Neutral - 1 x daily - 7 x weekly - 2 sets - 10 reps Standing Single Arm High Row with Resistance - 1 x daily - 7 x weekly - 2 sets - 10 reps Seated Shoulder Press with Resistance - 1 x daily - 7 x weekly - 1 sets - 10 reps SHAVE THE HEAD WITH YELLOW BAND - 1 x daily - 7 x weekly - 1 sets - 10 reps Standing Shoulder Flexion to 90 Degrees with Dumbbells - 1 x daily - 7 x weekly - 1 sets - 10 reps Scaption with Dumbbells - 1 x daily - 7 x weekly - 1 sets - 10 reps Shoulder Abduction with Dumbbells - Thumbs Up - 1 x daily - 7 x weekly - 1 sets - 10 reps Wall Angels - 1 x daily - 7 x weekly - 1 sets - 10 reps Standing Lumbar Extension at Wall - 1 x daily - 7 x weekly - 1 sets - 10 reps Push-Up on Counter - 1 x daily - 7 x weekly - 2 sets - 10 reps Wall Clock with Theraband - 1 x daily - 7 x weekly - 1 sets - 10 reps Standing Low Trap Setting with Resistance at [place] - 1 x daily - 7 x weekly - 1 sets - 10 reps Wall Scoops - 1 x daily - 7 x  weekly - 1 sets - 10 reps Prone Shoulder Extension - 1 x daily - 7 x weekly - 1 sets - 10 reps Prone Shoulder Horizontal Abduction with Thumbs Up - 1 x daily - 7 x weekly - 1 sets - 10 reps Prone W Scapular Retraction - 1 x daily - 7 x weekly - 1 sets - 10 reps

## 2020-11-20 NOTE — Therapy (Addendum)
[facility] [address] [place], [place], [address] Phone: [phone]   Fax:  [phone]  Physical Therapy Treatment/Discharge Summary   Patient Details  Name: Crystal [name] MRN: [id number] Date of Birth: 1951-03-20 Referring Provider (PT): Hoyt Koch, MD   Encounter Date: 11/20/2020   PT End of Session - 11/20/20 1210     Visit Number 11    Date for PT Re-Evaluation 12/11/20    Authorization Type medicare A/B    PT Start Time 0802    PT Stop Time 0845    PT Time Calculation (min) 43 min    Activity Tolerance Patient tolerated treatment well             Past Medical History:  Diagnosis Date   BACK PAIN, LUMBAR    Breast cancer of lower-outer quadrant of right female breast ([place]) 10/24/2014   Breast cancer, right breast    FIBROADENOMA, BREAST 2006   New onset atrial fibrillation ([place]) 01/06/2020   Palpitations 01/06/2020   PARESTHESIA    toes   PONV (postoperative nausea and vomiting)    Radiation 11/23/14-12/25/14   right breast 42.72 gray, lumpectomy cavity boosted to 54.72 gray   Routine health maintenance 03/30/2013   Colonoscopy - for colonoscopy (Sept '14)  Immunization  Tdap Oct '14    Spider veins of both lower extremities 05/09/2019   Wears glasses     Past Surgical History:  Procedure Laterality Date   ATRIAL FIBRILLATION ABLATION N/A 05/25/2020   Procedure: ATRIAL FIBRILLATION ABLATION;  Surgeon: Constance Haw, MD;  Location: [facility];  Service: Cardiovascular;  Laterality: N/A;   atrophic vaginitis     BREAST LUMPECTOMY WITH RADIOACTIVE SEED LOCALIZATION Right 10/18/2014   Procedure: BREAST LUMPECTOMY WITH RADIOACTIVE SEED LOCALIZATION;  Surgeon: Coralie Keens, MD;  Location: [facility];  Service: General;  Laterality: Right;   COLONOSCOPY  09/05/2013   D & C and hysteroscopy  11/1998   for uterine polyp   DILATATION & CURRETTAGE/HYSTEROSCOPY  WITH RESECTOCOPE N/A 05/30/2015   Procedure: [place];  Surgeon: Princess Bruins, MD;  Location: [facility];  Service: Gynecology;  Laterality: N/A;   DILATION AND CURETTAGE OF UTERUS     Sterotactix needle biopsy  2000   breast calcification    There were no vitals filed for this visit.   Subjective Assessment - 11/20/20 0803     Subjective I'm great mostly but some burning between shoulder blades.  Not hurting now. Doing fine with drying hair.    Patient Stated Goals sitting at night, rolling in bed, driving    Currently in Pain? No/denies    Pain Score 0-No pain    Pain Type Chronic pain                [facility] PT Assessment - 11/20/20 0001       Observation/Other Assessments   Focus on Therapeutic Outcomes (FOTO)  87%      AROM   Overall AROM Comments shoulder bil WNL    Cervical Flexion 60    Cervical Extension 60    Cervical - Right Side Bend 48    Cervical - Left Side Bend 42    Cervical - Right Rotation 50    Cervical - Left Rotation 50      Strength   Overall Strength Comments bil shoulders 5/5; cervical/scapular muscles 4+/5 to 5-/5  [place] [facility] PT Treatment/Exercise - 11/20/20 0001       Neck Exercises: Standing   Other Standing Exercises light green loop with UE elevation 5x    Other Standing Exercises light green loop wall clocks 5x right/left and scoops 5x      Neck Exercises: Prone   Shoulder Extension 10 reps    Shoulder Extension Limitations with head lift    UE Flexion with Stabilization Limitations single arm with head lift 5x right/left    Other Prone Exercise UE Ts with head lift 5x    Other Prone Exercise UE Ws with head lift 6x      Moist Heat Therapy   Number Minutes Moist Heat 5 Minutes    Moist Heat Location Cervical      Manual Therapy   Soft tissue mobilization bil upper traps, supraspinatus, levator scap, rhomboids; paraspinals                     PT Education - 11/20/20 1210     Education Details wall band ex's; prone Is, Ts, Ws, overhead lifts with head lift    Person(s) Educated Patient    Methods Explanation;Demonstration;Handout    Comprehension Returned demonstration;Verbalized understanding              PT Short Term Goals - 09/18/20 2035       PT SHORT TERM GOAL #1   Title Ind with initial HEP    Status Achieved      PT SHORT TERM GOAL #2   Title The patient will report a 30% improvement in lower cervical, upper back pain level with ADLs    Status Achieved      PT SHORT TERM GOAL #3   Title Patient will have improved symmetrical cervical sidebending and rotation    Status Achieved               PT Long Term Goals - 11/20/20 1217       PT LONG TERM GOAL #1   Title The patient will be independent with a safe self progression of HEP    Status Partially Met      PT LONG TERM GOAL #2   Title The patient will report a 70% improvement in pain across lower cervical/upper back region with home and work ADLs    Time 12    Period Weeks    Status On-going      PT LONG TERM GOAL #3   Title Pt will be able to demonstrate imporved upright posture due to greater strength and endurance of periscapular and back extensor strength    Status Achieved      PT LONG TERM GOAL #4   Title FOTO functional outcome score improved from 55% functional to 66%    Status Achieved                   Plan - 11/20/20 1211     Clinical Impression Statement The patient reports low intensity central upper thoracic discomfort intermittently.  Discussed strategies including exercises to improve soft tissue mobility and postural muscle motor control in this area.  Overall she continues to do well functionally with no pain fixing her hair or with most of her ADLS.  Few tender points noted in rhomboids and upper traps. Anticipate readiness for discharge next visit.    Personal Factors and Comorbidities  Age;Time since onset of injury/illness/exacerbation;Comorbidity 1;Past/Current Experience    Comorbidities osteopenia    Rehab Potential Excellent  PT Frequency Biweekly    PT Duration 12 weeks    PT Treatment/Interventions ADLs/Self Care Home Management;Biofeedback;Cryotherapy;Electrical Stimulation;Moist Heat;Traction;Neuromuscular re-education;Therapeutic activities;Therapeutic exercise;Patient/family education;Manual techniques;Dry needling;Passive range of motion;Taping    PT Next Visit Plan anticipate discharge; DN; manual therapy right scapular region;  progressive peri scap muscle strengthening    PT Home Exercise Plan Access Code: [email or web address]             Patient will benefit from skilled therapeutic intervention in order to improve the following deficits and impairments:  Pain,Hypomobility,Decreased strength,Postural dysfunction,Decreased range of motion,Increased muscle spasms  Visit Diagnosis: Cervicalgia  Abnormal posture   PHYSICAL THERAPY DISCHARGE SUMMARY  Visits from Start of Care: 11  Current functional level related to goals / functional outcomes: See clinical impressions above   Remaining deficits: As above   Education / Equipment: Comprehensive HEP   Patient agrees to discharge. Patient goals were met. Patient is being discharged due to meeting the stated rehab goals.   Problem List Patient Active Problem List   Diagnosis Date Noted   Hyperthyroidism 10/28/2020   Weight loss 09/04/2020   Lung nodule 09/04/2020   Paroxysmal atrial fibrillation with rapid ventricular response ([place]) 01/06/2020   Spider veins of both lower extremities 05/09/2019   HX: breast cancer 10/24/2014   Routine health maintenance 03/30/2013   PARESTHESIA 06/25/2010   Ruben Im, PT 11/20/20 12:19 PM Phone: [phone] Fax: [phone] Alvera Singh 11/20/2020, 12:18 PM  [facility] [address] [place], [place], [address] Phone: [phone]   Fax:  [phone]  Name: EREN PUEBLA MRN: [id number] Date of Birth: Mar 28, 1951

## 2020-11-29 ENCOUNTER — Encounter: Payer: Self-pay | Admitting: Cardiology

## 2020-11-29 ENCOUNTER — Other Ambulatory Visit: Payer: Self-pay

## 2020-11-29 ENCOUNTER — Ambulatory Visit (INDEPENDENT_AMBULATORY_CARE_PROVIDER_SITE_OTHER): Payer: Medicare Other | Admitting: Cardiology

## 2020-11-29 VITALS — BP 116/74 | HR 66 | Ht 70.0 in | Wt 144.4 lb

## 2020-11-29 DIAGNOSIS — I480 Paroxysmal atrial fibrillation: Secondary | ICD-10-CM | POA: Diagnosis not present

## 2020-11-29 MED ORDER — FLECAINIDE ACETATE 50 MG PO TABS: 50.0000 mg | ORAL_TABLET | Freq: Two times a day (BID) | ORAL | 3 refills | Status: DC

## 2020-11-29 NOTE — Progress Notes (Signed)
Electrophysiology Office Note   Date:  11/29/2020   ID:  Crystal [name], DOB 1951-03-15, MRN [id number]  PCP:  Hoyt Koch, MD  Cardiologist: Burt Knack Primary Electrophysiologist:  Prosperity Darrough Meredith Leeds, MD    Chief Complaint: Atrial fibrillation   History of Present Illness: Crystal [name] is a 70 y.o. female who is being seen today for the evaluation of atrial fibrillation at the request of Hoyt Koch, *. Presenting today for electrophysiology evaluation.  She has a history of right-sided breast cancer and paroxysmal atrial fibrillation.  She was diagnosed with atrial fibrillation 01/18/2020.  She was started on metoprolol and Eliquis.  ZIO monitor showed a 5% burden.  She is status post ablation 05/25/2020.  Today, denies symptoms of palpitations, chest pain, shortness of breath, orthopnea, PND, lower extremity edema, claudication, dizziness, presyncope, syncope, bleeding, or neurologic sequela. The patient is tolerating medications without difficulties.  She continues to have episodes of atrial fibrillation.  She has an apple watch which is confirmed her episodes.  She has weakness and fatigue when she has these episodes and occasional shortness of breath.  She was recently diagnosed with hyperthyroidism and is now on methimazole.  Past Medical History:  Diagnosis Date  . BACK PAIN, LUMBAR   . Breast cancer of lower-outer quadrant of right female breast ([place]) 10/24/2014  . Breast cancer, right breast   . FIBROADENOMA, BREAST 2006  . New onset atrial fibrillation ([place]) 01/06/2020  . Palpitations 01/06/2020  . PARESTHESIA    toes  . PONV (postoperative nausea and vomiting)   . Radiation 11/23/14-12/25/14   right breast 42.72 gray, lumpectomy cavity boosted to 54.72 gray  . Routine health maintenance 03/30/2013   Colonoscopy - for colonoscopy (Sept '14)  Immunization  Tdap Oct '14   . Spider veins of both lower extremities 05/09/2019  . Wears glasses    Past  Surgical History:  Procedure Laterality Date  . ATRIAL FIBRILLATION ABLATION N/A 05/25/2020   Procedure: ATRIAL FIBRILLATION ABLATION;  Surgeon: Constance Haw, MD;  Location: [facility];  Service: Cardiovascular;  Laterality: N/A;  . atrophic vaginitis    . BREAST LUMPECTOMY WITH RADIOACTIVE SEED LOCALIZATION Right 10/18/2014   Procedure: BREAST LUMPECTOMY WITH RADIOACTIVE SEED LOCALIZATION;  Surgeon: Coralie Keens, MD;  Location: [facility];  Service: General;  Laterality: Right;  . COLONOSCOPY  09/05/2013  . D & C and hysteroscopy  11/1998   for uterine polyp  . DILATATION & CURRETTAGE/HYSTEROSCOPY WITH RESECTOCOPE N/A 05/30/2015   Procedure: DILATATION & CURETTAGE/HYSTEROSCOPY WITH RESECTOCOPE;  Surgeon: Princess Bruins, MD;  Location: [facility];  Service: Gynecology;  Laterality: N/A;  . DILATION AND CURETTAGE OF UTERUS    . Sterotactix needle biopsy  2000   breast calcification     Current Outpatient Medications  Medication Sig Dispense Refill  . acetaminophen (TYLENOL) 325 MG tablet Take 325 mg by mouth as needed for moderate pain or headache.    Marland Kitchen apixaban (ELIQUIS) 5 MG TABS tablet Take 1 tablet (5 mg total) by mouth 2 (two) times daily. 90 tablet 1  . Calcium Carbonate-Vitamin D 600-400 MG-UNIT tablet Take 1 tablet by mouth daily.    . cetirizine (ZYRTEC) 10 MG tablet Take 10 mg by mouth daily.    . Cholecalciferol (VITAMIN D3) 1000 UNITS CAPS Take 1,000 Units by mouth daily.    . flecainide (TAMBOCOR) 50 MG tablet Take 1 tablet (50 mg total) by mouth 2 (two) times daily. 60 tablet 3  .  methimazole (TAPAZOLE) 10 MG tablet Take 1 tablet (10 mg total) by mouth 2 (two) times daily. 180 tablet 3  . metoprolol succinate (TOPROL-XL) 25 MG 24 hr tablet Take 1 tablet (25 mg total) by mouth in the morning and at bedtime. 60 tablet 3  . Multiple Vitamin (MULTIVITAMIN) tablet Take 1 tablet by mouth daily.    Vladimir Faster Glycol-Propyl Glycol (SYSTANE OP) Place 1  drop into both eyes daily as needed (For dryness.).     Marland Kitchen Probiotic Product (FORTIFY DAILY PROBIOTIC PO) Take 1 capsule by mouth daily.      No current facility-administered medications for this visit.    Allergies:   Patient has no known allergies.   Social History:  The patient  reports that she has never smoked. She has never used smokeless tobacco. She reports current alcohol use of about 5.0 standard drinks of alcohol per week. She reports that she does not use drugs.   Family History:  The patient's family history includes Arrhythmia in her mother; Cancer in her mother; Coronary artery disease in her father and maternal grandfather; Diabetes in her father; GER disease in her mother; Hyperlipidemia in her father; Hypertension in her father and mother; Hypothyroidism in her mother; Osteoporosis in her mother.   ROS:  Please see the history of present illness.   Otherwise, review of systems is positive for none.   All other systems are reviewed and negative.   PHYSICAL EXAM: VS:  BP 116/74   Pulse 66   Ht 5\' 10"  (1.778 m)   Wt 144 lb 6.4 oz (65.5 kg)   SpO2 98%   BMI 20.72 kg/m  , BMI Body mass index is 20.72 kg/m. GEN: Well nourished, well developed, in no acute distress  HEENT: normal  Neck: no JVD, carotid bruits, or masses Cardiac: RRR; no murmurs, rubs, or gallops,no edema  Respiratory:  clear to auscultation bilaterally, normal work of breathing GI: soft, nontender, nondistended, + BS MS: no deformity or atrophy  Skin: warm and dry Neuro:  Strength and sensation are intact Psych: euthymic mood, full affect  EKG:  EKG is ordered today. Personal review of the ekg ordered shows sinus rhythm, rate 66  Recent Labs: 09/04/2020: ALT 80; BUN 22; Creatinine, Ser 0.83; Hemoglobin 11.9; Platelets 235.0; Potassium 4.3; Sodium 140; TSH <0.01 Repeated and verified X2.    Lipid Panel     Component Value Date/Time   CHOL 147 09/04/2020 0832   TRIG 138.0 09/04/2020 0832   HDL  57.30 09/04/2020 0832   CHOLHDL 3 09/04/2020 0832   VLDL 27.6 09/04/2020 0832   LDLCALC 62 09/04/2020 0832     Wt Readings from Last 3 Encounters:  11/29/20 144 lb 6.4 oz (65.5 kg)  10/26/20 145 lb 3.2 oz (65.9 kg)  09/04/20 145 lb (65.8 kg)      Other studies Reviewed: Additional studies/ records that were reviewed today include: TTE 01/31/20  Review of the above records today demonstrates:  1. Left ventricular ejection fraction, by estimation, is 60 to 65%. The  left ventricle has normal function. The left ventricle has no regional  wall motion abnormalities. Left ventricular diastolic parameters were  normal.  2. Right ventricular systolic function is normal. The right ventricular  size is normal. There is normal pulmonary artery systolic pressure.  3. The mitral valve is normal in structure. Trivial mitral valve  regurgitation. No evidence of mitral stenosis.  4. The aortic valve is tricuspid. Aortic valve regurgitation is trivial.  No  aortic stenosis is present.  5. The inferior vena cava is normal in size with greater than 50%  respiratory variability, suggesting right atrial pressure of 3 mmHg.   Monitor 02/21/20 personally reviewed 1. The basic rhythm is normal sinus with an average HR of 71 bpm 2. Atrial fibrillation with rapid ventricular rate occurs with a 5% burden 3. No high-grade heart block or pathologic pauses 4. No bradycardic events 5. No significant ventricular ectopy    ASSESSMENT AND PLAN:  1.  Paroxysmal atrial fibrillation: Currently on Eliquis and metoprolol.  CHA2DS2-VASc of 2.  Status post ablation 05/25/2020.  She has had more frequent episodes of atrial fibrillation.  She was also diagnosed with hyper thyroidism and is on methimazole.  She would like to see if treating her thyroid Olof Marcil improve her atrial fibrillation.  In the interim, we Lekeith Wulf start her on flecainide 50 mg.  Current medicines are reviewed at length with the patient today.   The  patient does not have concerns regarding her medicines.  The following changes were made today: Start flecainide  Labs/ tests ordered today include:  Orders Placed This Encounter  Procedures  . EKG 12-Lead     Disposition:   FU with Bhavesh Vazquez 6 months  Signed, Eeshan Verbrugge Meredith Leeds, MD  11/29/2020 10:40 AM     [facility] [address] [place] [address] [phone] (office) [phone] (fax)

## 2020-11-29 NOTE — Patient Instructions (Addendum)
Medication Instructions:  Your physician has recommended you make the following change in your medication:  1. START Flecainide 50 mg TWICE a day  *If you need a refill on your cardiac medications before your next appointment, please call your pharmacy*   Lab Work: None ordered   Testing/Procedures: None ordered   Follow-Up: At [facility], you and your health needs are our priority.  As part of our continuing mission to provide you with exceptional heart care, we have created designated Provider Care Teams.  These Care Teams include your primary Cardiologist (physician) and Advanced Practice Providers (APPs -  Physician Assistants and Nurse Practitioners) who all work together to provide you with the care you need, when you need it.  Your next appointment:   3 month(s)  The format for your next appointment:   In Person  Provider:   Allegra Lai, MD    Thank you for choosing [facility]!!   Trinidad Curet, RN [phone]   Other Instructions  Flecainide tablets What is this medicine? FLECAINIDE (FLEK a nide) is an antiarrhythmic drug. This medicine is used to prevent irregular heart rhythm. It can also slow down fast heartbeats called tachycardia. This medicine may be used for other purposes; ask your health care provider or pharmacist if you have questions. COMMON BRAND NAME(S): Tambocor What should I tell my health care provider before I take this medicine? They need to know if you have any of these conditions:  abnormal levels of potassium in the blood  heart disease including heart rhythm and heart rate problems  kidney or liver disease  recent heart attack  an unusual or allergic reaction to flecainide, local anesthetics, other medicines, foods, dyes, or preservatives  pregnant or trying to get pregnant  breast-feeding How should I use this medicine? Take this medicine by mouth with a glass of water. Follow the directions on the prescription  label. You can take this medicine with or without food. Take your doses at regular intervals. Do not take your medicine more often than directed. Do not stop taking this medicine suddenly. This may cause serious, heart-related side effects. If your doctor wants you to stop the medicine, the dose may be slowly lowered over time to avoid any side effects. Talk to your pediatrician regarding the use of this medicine in children. While this drug may be prescribed for children as young as 1 year of age for selected conditions, precautions do apply. Overdosage: If you think you have taken too much of this medicine contact a poison control center or emergency room at once. NOTE: This medicine is only for you. Do not share this medicine with others. What if I miss a dose? If you miss a dose, take it as soon as you can. If it is almost time for your next dose, take only that dose. Do not take double or extra doses. What may interact with this medicine? Do not take this medicine with any of the following medications:  amoxapine  arsenic trioxide  certain antibiotics like clarithromycin, erythromycin, gatifloxacin, gemifloxacin, levofloxacin, moxifloxacin, sparfloxacin, or troleandomycin  certain antidepressants called tricyclic antidepressants like amitriptyline, imipramine, or nortriptyline  certain medicines to control heart rhythm like disopyramide, encainide, moricizine, procainamide, propafenone, and quinidine  cisapride  delavirdine  droperidol  haloperidol  hawthorn  imatinib  levomethadyl  maprotiline  medicines for malaria like chloroquine and halofantrine  pentamidine  phenothiazines like chlorpromazine, mesoridazine, prochlorperazine, thioridazine  pimozide  quinine  ranolazine  ritonavir  sertindole This medicine  may also interact with the following medications:  cimetidine  dofetilide  medicines for angina or high blood pressure  medicines to control heart  rhythm like amiodarone and digoxin  ziprasidone This list may not describe all possible interactions. Give your health care provider a list of all the medicines, herbs, non-prescription drugs, or dietary supplements you use. Also tell them if you smoke, drink alcohol, or use illegal drugs. Some items may interact with your medicine. What should I watch for while using this medicine? Visit your doctor or health care professional for regular checks on your progress. Because your condition and the use of this medicine carries some risk, it is a good idea to carry an identification card, necklace or bracelet with details of your condition, medications and doctor or health care professional. Check your blood pressure and pulse rate regularly. Ask your health care professional what your blood pressure and pulse rate should be, and when you should contact him or her. Your doctor or health care professional also may schedule regular blood tests and electrocardiograms to check your progress. You may get drowsy or dizzy. Do not drive, use machinery, or do anything that needs mental alertness until you know how this medicine affects you. Do not stand or sit up quickly, especially if you are an older patient. This reduces the risk of dizzy or fainting spells. Alcohol can make you more dizzy, increase flushing and rapid heartbeats. Avoid alcoholic drinks. What side effects may I notice from receiving this medicine? Side effects that you should report to your doctor or health care professional as soon as possible:  chest pain, continued irregular heartbeats  difficulty breathing  swelling of the legs or feet  trembling, shaking  unusually weak or tired Side effects that usually do not require medical attention (report to your doctor or health care professional if they continue or are bothersome):  blurred vision  constipation  headache  nausea, vomiting  stomach pain This list may not describe all  possible side effects. Call your doctor for medical advice about side effects. You may report side effects to FDA at 1-800-FDA-1088. Where should I keep my medicine? Keep out of the reach of children. Store at room temperature between 15 and 30 degrees C (59 and 86 degrees F). Protect from light. Keep container tightly closed. Throw away any unused medicine after the expiration date. NOTE: This sheet is a summary. It may not cover all possible information. If you have questions about this medicine, talk to your doctor, pharmacist, or health care provider.  2021 Elsevier/Gold Standard (2018-06-07 11:41:38)

## 2020-12-04 ENCOUNTER — Encounter: Payer: Medicare Other | Admitting: Physical Therapy

## 2020-12-18 ENCOUNTER — Encounter: Payer: Medicare Other | Admitting: Physical Therapy

## 2020-12-19 ENCOUNTER — Encounter: Payer: Self-pay | Admitting: Endocrinology

## 2020-12-19 ENCOUNTER — Other Ambulatory Visit: Payer: Self-pay

## 2020-12-19 ENCOUNTER — Ambulatory Visit (INDEPENDENT_AMBULATORY_CARE_PROVIDER_SITE_OTHER): Payer: Medicare Other | Admitting: Endocrinology

## 2020-12-19 VITALS — BP 110/72 | HR 60 | Ht 70.0 in | Wt 146.4 lb

## 2020-12-19 DIAGNOSIS — E0590 Thyrotoxicosis, unspecified without thyrotoxic crisis or storm: Secondary | ICD-10-CM | POA: Diagnosis not present

## 2020-12-19 LAB — T4, FREE: Free T4: 0.46 ng/dL — ABNORMAL LOW (ref 0.60–1.60)

## 2020-12-19 LAB — TSH: TSH: 4.36 u[IU]/mL (ref 0.35–4.50)

## 2020-12-19 MED ORDER — METHIMAZOLE 10 MG PO TABS: 10.0000 mg | ORAL_TABLET | Freq: Every day | ORAL | 3 refills | Status: DC

## 2020-12-19 NOTE — Patient Instructions (Addendum)
Blood tests are requested for you today.  We'll let you know about the results.  If ever you have fever while taking methimazole, stop it and call us, even if the reason is obvious, because of the risk of a rare side-effect. It is best to never miss the medication.  However, if you do miss it, next best is to double up the next time.   Please come back for a follow-up appointment in 2 months.  

## 2020-12-19 NOTE — Progress Notes (Signed)
Subjective:    Patient ID: Crystal [name], female    DOB: 02-Mar-1951, 70 y.o.   MRN: [id number]  HPI Pt returns for f/u of hyperthyroidism (dx'ed 2021; she has never had thyroid imaging; tapazole was chosen as rx, due to AF).  pt states she feels well in general, except for palpitations, which are now rare.  She takes meds as rx'ed.   Past Medical History:  Diagnosis Date   BACK PAIN, LUMBAR    Breast cancer of lower-outer quadrant of right female breast ([place]) 10/24/2014   Breast cancer, right breast    FIBROADENOMA, BREAST 2006   New onset atrial fibrillation ([place]) 01/06/2020   Palpitations 01/06/2020   PARESTHESIA    toes   PONV (postoperative nausea and vomiting)    Radiation 11/23/14-12/25/14   right breast 42.72 gray, lumpectomy cavity boosted to 54.72 gray   Routine health maintenance 03/30/2013   Colonoscopy - for colonoscopy (Sept '14)  Immunization  Tdap Oct '14    Spider veins of both lower extremities 05/09/2019   Wears glasses     Past Surgical History:  Procedure Laterality Date   ATRIAL FIBRILLATION ABLATION N/A 05/25/2020   Procedure: ATRIAL FIBRILLATION ABLATION;  Surgeon: Constance Haw, MD;  Location: [facility];  Service: Cardiovascular;  Laterality: N/A;   atrophic vaginitis     BREAST LUMPECTOMY WITH RADIOACTIVE SEED LOCALIZATION Right 10/18/2014   Procedure: BREAST LUMPECTOMY WITH RADIOACTIVE SEED LOCALIZATION;  Surgeon: Coralie Keens, MD;  Location: [facility];  Service: General;  Laterality: Right;   COLONOSCOPY  09/05/2013   D & C and hysteroscopy  11/1998   for uterine polyp   DILATATION & CURRETTAGE/HYSTEROSCOPY WITH RESECTOCOPE N/A 05/30/2015   Procedure: [place];  Surgeon: Princess Bruins, MD;  Location: [facility];  Service: Gynecology;  Laterality: N/A;   DILATION AND CURETTAGE OF UTERUS     Sterotactix needle biopsy  2000   breast calcification    Social History    Socioeconomic History   Marital status: Married    Spouse name: howard   Number of children: 2   Years of education: 16   Highest education level: Not on file  Occupational History   Occupation: PROPERTY MGR    Employer: [employer]  Tobacco Use   Smoking status: Never   Smokeless tobacco: Never  Vaping Use   Vaping Use: Never used  Substance and Sexual Activity   Alcohol use: Yes    Alcohol/week: 5.0 standard drinks    Types: 5 Standard drinks or equivalent per week    Comment: 2 beers/week   Drug use: No   Sexual activity: Yes    Partners: Male    Comment: 1st intercourse- 12, partners-  married-  61 yrs   Other Topics Concern   Not on file  Social History Narrative   HSG, [place], Married '78,  1 son '81, 1 daughter '84, 1 grandson '12.    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 ACP - discussed with patient.    Refer - [email or web address]   Highest level of education:  BS   Right handed   Two story home   Social Determinants of Health   Financial Resource Strain: Not on file  Food Insecurity: Not on file  Transportation Needs: Not on file  Physical Activity: Not on file  Stress: Not on file  Social Connections: Not on file  Intimate Partner Violence:  Not on file    Current Outpatient Medications on File Prior to Visit  Medication Sig Dispense Refill   acetaminophen (TYLENOL) 325 MG tablet Take 325 mg by mouth as needed for moderate pain or headache.     apixaban (ELIQUIS) 5 MG TABS tablet Take 1 tablet (5 mg total) by mouth 2 (two) times daily. 90 tablet 1   Calcium Carbonate-Vitamin D 600-400 MG-UNIT tablet Take 1 tablet by mouth daily.     cetirizine (ZYRTEC) 10 MG tablet Take 10 mg by mouth daily.     Cholecalciferol (VITAMIN D3) 1000 UNITS CAPS Take 1,000 Units by mouth daily.     flecainide (TAMBOCOR) 50 MG tablet Take 1 tablet (50 mg total) by mouth 2 (two) times daily. 60 tablet 3   metoprolol  succinate (TOPROL-XL) 25 MG 24 hr tablet Take 1 tablet (25 mg total) by mouth in the morning and at bedtime. 60 tablet 3   Multiple Vitamin (MULTIVITAMIN) tablet Take 1 tablet by mouth daily.     Polyethyl Glycol-Propyl Glycol (SYSTANE OP) Place 1 drop into both eyes daily as needed (For dryness.).      Probiotic Product (FORTIFY DAILY PROBIOTIC PO) Take 1 capsule by mouth daily.      No current facility-administered medications on file prior to visit.    No Known Allergies  Family History  Problem Relation Age of Onset   Hypothyroidism Mother    GER disease Mother    Osteoporosis Mother    Hypertension Mother    Cancer Mother        hodgkins lympoma, uterine cancer   Arrhythmia Mother    Hyperlipidemia Father    Hypertension Father    Diabetes Father    Coronary artery disease Father    Coronary artery disease Maternal Grandfather    COPD Neg Hx    Asthma Neg Hx    Colon cancer Neg Hx    Esophageal cancer Neg Hx    Rectal cancer Neg Hx    Stomach cancer Neg Hx     BP 110/72 (BP Location: Right Arm, Patient Position: Sitting, Cuff Size: Normal)   Pulse 60   Ht 5\' 10"  (1.778 m)   Wt 146 lb 6.1 oz (66.4 kg)   SpO2 97%   BMI 21.00 kg/m   Review of Systems Denies fever    Objective:   Physical Exam VITAL SIGNS:  See vs page GENERAL: no distress NECK: There is no palpable thyroid enlargement.  No thyroid nodule is palpable.  No palpable lymphadenopathy at the anterior neck.  Lab Results  Component Value Date   TSH 4.36 12/19/2020      Assessment & Plan:  Hyperthyroidism: improved.  reduce the methimazole to 10 mg, once a day.

## 2021-01-04 ENCOUNTER — Other Ambulatory Visit (HOSPITAL_COMMUNITY): Payer: Self-pay | Admitting: Nurse Practitioner

## 2021-01-25 NOTE — Progress Notes (Signed)
Cardiology Office Note:    Date:  01/28/2021   ID:  Crystal [name], DOB 1951-04-05, MRN [id number]  PCP:  Hoyt Koch, MD   [facility] Cardiologist:  Sherren Mocha, MD Electrophysiologist:  Will Meredith Leeds, MD     Referring MD: Hoyt Koch, *   Chief Complaint  Patient presents with   Atrial Fibrillation     History of Present Illness:    Crystal [name] is a 70 y.o. female with a hx of paroxysmal atrial fibrillation who underwent atrial fib ablation in November 2021.  She was last seen by Dr. Curt Bears in June 2022 at which time she complained of continued episodes of atrial fibrillation with associated symptoms of weakness and fatigue.  She was started on flecainide 50 mg daily at that time.  She was noted to have hyperthyroidism and had initiated treatment with methimazole. She was losing weight and hyperthyroidism was discovered. Since she's been treated with methimazole her weight loss has improved and appetite is better now.  Her atrial fibrillation symptoms have been much improved.  She has not had any recent episodes of heart palpitations.  She denies chest pain or shortness of breath.  She has no complaints today.    Past Medical History:  Diagnosis Date   BACK PAIN, LUMBAR    Breast cancer of lower-outer quadrant of right female breast ([place]) 10/24/2014   Breast cancer, right breast    FIBROADENOMA, BREAST 2006   New onset atrial fibrillation ([place]) 01/06/2020   Palpitations 01/06/2020   PARESTHESIA    toes   PONV (postoperative nausea and vomiting)    Radiation 11/23/14-12/25/14   right breast 42.72 gray, lumpectomy cavity boosted to 54.72 gray   Routine health maintenance 03/30/2013   Colonoscopy - for colonoscopy (Sept '14)  Immunization  Tdap Oct '14    Spider veins of both lower extremities 05/09/2019   Wears glasses     Past Surgical History:  Procedure Laterality Date   ATRIAL FIBRILLATION ABLATION N/A 05/25/2020    Procedure: ATRIAL FIBRILLATION ABLATION;  Surgeon: Constance Haw, MD;  Location: [facility];  Service: Cardiovascular;  Laterality: N/A;   atrophic vaginitis     BREAST LUMPECTOMY WITH RADIOACTIVE SEED LOCALIZATION Right 10/18/2014   Procedure: BREAST LUMPECTOMY WITH RADIOACTIVE SEED LOCALIZATION;  Surgeon: Coralie Keens, MD;  Location: [facility];  Service: General;  Laterality: Right;   COLONOSCOPY  09/05/2013   D & C and hysteroscopy  11/1998   for uterine polyp   DILATATION & CURRETTAGE/HYSTEROSCOPY WITH RESECTOCOPE N/A 05/30/2015   Procedure: [place];  Surgeon: Princess Bruins, MD;  Location: [facility];  Service: Gynecology;  Laterality: N/A;   DILATION AND CURETTAGE OF UTERUS     Sterotactix needle biopsy  2000   breast calcification    Current Medications: Current Meds  Medication Sig   acetaminophen (TYLENOL) 325 MG tablet Take 325 mg by mouth as needed for moderate pain or headache.   apixaban (ELIQUIS) 5 MG TABS tablet Take 1 tablet (5 mg total) by mouth 2 (two) times daily.   Calcium Carbonate-Vitamin D 600-400 MG-UNIT tablet Take 1 tablet by mouth daily.   cetirizine (ZYRTEC) 10 MG tablet Take 10 mg by mouth daily.   Cholecalciferol (VITAMIN D3) 1000 UNITS CAPS Take 1,000 Units by mouth daily.   flecainide (TAMBOCOR) 50 MG tablet Take 1 tablet (50 mg total) by mouth 2 (two) times daily.   methimazole (TAPAZOLE) 10 MG  tablet Take 1 tablet (10 mg total) by mouth daily.   metoprolol succinate (TOPROL-XL) 25 MG 24 hr tablet Take 1 tablet (25 mg total) by mouth in the morning and at bedtime.   Multiple Vitamin (MULTIVITAMIN) tablet Take 1 tablet by mouth daily.   Polyethyl Glycol-Propyl Glycol (SYSTANE OP) Place 1 drop into both eyes daily as needed (For dryness.).    Probiotic Product (FORTIFY DAILY PROBIOTIC PO) Take 1 capsule by mouth daily.      Allergies:   Patient has no known allergies.   Social  History   Socioeconomic History   Marital status: Married    Spouse name: howard   Number of children: 2   Years of education: 16   Highest education level: Not on file  Occupational History   Occupation: PROPERTY MGR    Employer: [employer]  Tobacco Use   Smoking status: Never   Smokeless tobacco: Never  Vaping Use   Vaping Use: Never used  Substance and Sexual Activity   Alcohol use: Yes    Alcohol/week: 5.0 standard drinks    Types: 5 Standard drinks or equivalent per week    Comment: 2 beers/week   Drug use: No   Sexual activity: Yes    Partners: Male    Comment: 1st intercourse- 73, partners-  married-  66 yrs   Other Topics Concern   Not on file  Social History Narrative   HSG, [place], Married '78,  1 son '81, 1 daughter '84, 1 grandson '12.    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 ACP - discussed with patient.    Refer - [email or web address]   Highest level of education:  BS   Right handed   Two story home   Social Determinants of Health   Financial Resource Strain: Not on file  Food Insecurity: Not on file  Transportation Needs: Not on file  Physical Activity: Not on file  Stress: Not on file  Social Connections: Not on file     Family History: The patient's family history includes Arrhythmia in her mother; Cancer in her mother; Coronary artery disease in her father and maternal grandfather; Diabetes in her father; GER disease in her mother; Hyperlipidemia in her father; Hypertension in her father and mother; Hypothyroidism in her mother; Osteoporosis in her mother. There is no history of COPD, Asthma, Colon cancer, Esophageal cancer, Rectal cancer, or Stomach cancer.  ROS:   Please see the history of present illness.    All other systems reviewed and are negative.  EKGs/Labs/Other Studies Reviewed:    The following studies were reviewed today: Echo 01/31/20: IMPRESSIONS     1. Left  ventricular ejection fraction, by estimation, is 60 to 65%. The  left ventricle has normal function. The left ventricle has no regional  wall motion abnormalities. Left ventricular diastolic parameters were  normal.   2. Right ventricular systolic function is normal. The right ventricular  size is normal. There is normal pulmonary artery systolic pressure.   3. The mitral valve is normal in structure. Trivial mitral valve  regurgitation. No evidence of mitral stenosis.   4. The aortic valve is tricuspid. Aortic valve regurgitation is trivial.  No aortic stenosis is present.   5. The inferior vena cava is normal in size with greater than 50%  respiratory variability, suggesting right atrial pressure of 3 mmHg.   EKG:  EKG is not ordered today.    Recent Labs: 09/04/2020: ALT  80; BUN 22; Creatinine, Ser 0.83; Hemoglobin 11.9; Platelets 235.0; Potassium 4.3; Sodium 140 12/19/2020: TSH 4.36  Recent Lipid Panel    Component Value Date/Time   CHOL 147 09/04/2020 0832   TRIG 138.0 09/04/2020 0832   HDL 57.30 09/04/2020 0832   CHOLHDL 3 09/04/2020 0832   VLDL 27.6 09/04/2020 0832   LDLCALC 62 09/04/2020 0832     Risk Assessment/Calculations:    CHA2DS2-VASc Score = 2  This indicates a 2.2% annual risk of stroke. The patient's score is based upon: CHF History: No HTN History: No Diabetes History: No Stroke History: No Vascular Disease History: No Age Score: 1 Gender Score: 1           Physical Exam:    VS:  BP 122/70   Pulse 64   Ht '5\' 10"'$  (1.778 m)   Wt 148 lb (67.1 kg)   BMI 21.24 kg/m     Wt Readings from Last 3 Encounters:  01/28/21 148 lb (67.1 kg)  12/19/20 146 lb 6.1 oz (66.4 kg)  11/29/20 144 lb 6.4 oz (65.5 kg)     GEN:  Well nourished, well developed in no acute distress HEENT: Normal NECK: No JVD; No carotid bruits LYMPHATICS: No lymphadenopathy CARDIAC: RRR, no murmurs, rubs, gallops RESPIRATORY:  Clear to auscultation without rales, wheezing or  rhonchi  ABDOMEN: Soft, non-tender, non-distended MUSCULOSKELETAL:  No edema; No deformity  SKIN: Warm and dry NEUROLOGIC:  Alert and oriented x 3 PSYCHIATRIC:  Normal affect   ASSESSMENT:    1. Paroxysmal atrial fibrillation (HCC)    PLAN:    In order of problems listed above:  The patient is significantly improved with treatment of her hyperthyroidism.  She is tolerating flecainide well.  She has some concerns about taking long-term flecainide and she will review this when she sees Dr. Curt Bears next month.  She will continue on apixaban for oral anticoagulation as she is not having any bleeding problems.  She will continue on metoprolol succinate 25 mg twice daily.  When she is demonstrated to be euthyroid, it might be reasonable to stop flecainide and monitor her atrial fibrillation, but I will defer to Dr. Curt Bears regarding this.     Medication Adjustments/Labs and Tests Ordered: Current medicines are reviewed at length with the patient today.  Concerns regarding medicines are outlined above.  No orders of the defined types were placed in this encounter.  No orders of the defined types were placed in this encounter.   Patient Instructions  Medication Instructions:  Your physician recommends that you continue on your current medications as directed. Please refer to the Current Medication list given to you today.  *If you need a refill on your cardiac medications before your next appointment, please call your pharmacy*   Lab Work: None If you have labs (blood work) drawn today and your tests are completely normal, you will receive your results only by: [place] (if you have MyChart) OR A paper copy in the mail If you have any lab test that is abnormal or we need to change your treatment, we will call you to review the results.   Testing/Procedures: non   Follow-Up: At [facility], you and your health needs are our priority.  As part of our continuing mission  to provide you with exceptional heart care, we have created designated Provider Care Teams.  These Care Teams include your primary Cardiologist (physician) and Advanced Practice Providers (APPs -  Physician Assistants and Nurse Practitioners) who all  work together to provide you with the care you need, when you need it.  We recommend signing up for the patient portal called "MyChart".  Sign up information is provided on this After Visit Summary.  MyChart is used to connect with patients for Virtual Visits (Telemedicine).  Patients are able to view lab/test results, encounter notes, upcoming appointments, etc.  Non-urgent messages can be sent to your provider as well.   To learn more about what you can do with MyChart, go to [email or web address].    Your next appointment:   1 year(s)  The format for your next appointment:   In Person  Provider:   You may see Sherren Mocha, MD or one of the following Advanced Practice Providers on your designated Care Team:   Richardson Dopp, PA-C Robbie Lis, [place]   Other Instructions     Signed, Sherren Mocha, MD  01/28/2021 9:16 AM    [place]

## 2021-01-28 ENCOUNTER — Encounter: Payer: Self-pay | Admitting: Cardiovascular Disease

## 2021-01-28 ENCOUNTER — Ambulatory Visit (INDEPENDENT_AMBULATORY_CARE_PROVIDER_SITE_OTHER): Payer: Medicare Other | Admitting: Cardiovascular Disease

## 2021-01-28 ENCOUNTER — Other Ambulatory Visit: Payer: Self-pay

## 2021-01-28 VITALS — BP 122/70 | HR 64 | Ht 70.0 in | Wt 148.0 lb

## 2021-01-28 DIAGNOSIS — I480 Paroxysmal atrial fibrillation: Secondary | ICD-10-CM

## 2021-01-28 NOTE — Patient Instructions (Signed)
Medication Instructions:  Your physician recommends that you continue on your current medications as directed. Please refer to the Current Medication list given to you today.  *If you need a refill on your cardiac medications before your next appointment, please call your pharmacy*   Lab Work: None If you have labs (blood work) drawn today and your tests are completely normal, you will receive your results only by: [place] (if you have MyChart) OR A paper copy in the mail If you have any lab test that is abnormal or we need to change your treatment, we will call you to review the results.   Testing/Procedures: non   Follow-Up: At [facility], you and your health needs are our priority.  As part of our continuing mission to provide you with exceptional heart care, we have created designated Provider Care Teams.  These Care Teams include your primary Cardiologist (physician) and Advanced Practice Providers (APPs -  Physician Assistants and Nurse Practitioners) who all work together to provide you with the care you need, when you need it.  We recommend signing up for the patient portal called "MyChart".  Sign up information is provided on this After Visit Summary.  MyChart is used to connect with patients for Virtual Visits (Telemedicine).  Patients are able to view lab/test results, encounter notes, upcoming appointments, etc.  Non-urgent messages can be sent to your provider as well.   To learn more about what you can do with MyChart, go to [email or web address].    Your next appointment:   1 year(s)  The format for your next appointment:   In Person  Provider:   You may see Sherren Mocha, MD or one of the following Advanced Practice Providers on your designated Care Team:   Richardson Dopp, PA-C Robbie Lis, [place]   Other Instructions

## 2021-02-15 ENCOUNTER — Ambulatory Visit: Payer: BLUE CROSS/BLUE SHIELD | Admitting: Neurology

## 2021-02-28 ENCOUNTER — Ambulatory Visit (INDEPENDENT_AMBULATORY_CARE_PROVIDER_SITE_OTHER): Payer: Medicare Other

## 2021-02-28 ENCOUNTER — Encounter: Payer: Self-pay | Admitting: Emergency Medicine

## 2021-02-28 ENCOUNTER — Ambulatory Visit (INDEPENDENT_AMBULATORY_CARE_PROVIDER_SITE_OTHER): Payer: Medicare Other | Admitting: Emergency Medicine

## 2021-02-28 ENCOUNTER — Other Ambulatory Visit: Payer: Self-pay

## 2021-02-28 VITALS — BP 116/62 | HR 70 | Ht 70.0 in | Wt 149.0 lb

## 2021-02-28 DIAGNOSIS — S99921A Unspecified injury of right foot, initial encounter: Secondary | ICD-10-CM

## 2021-02-28 DIAGNOSIS — M79674 Pain in right toe(s): Secondary | ICD-10-CM | POA: Diagnosis not present

## 2021-02-28 DIAGNOSIS — Z23 Encounter for immunization: Secondary | ICD-10-CM | POA: Diagnosis not present

## 2021-02-28 DIAGNOSIS — S92524A Nondisplaced fracture of medial phalanx of right lesser toe(s), initial encounter for closed fracture: Secondary | ICD-10-CM | POA: Diagnosis not present

## 2021-02-28 NOTE — Addendum Note (Signed)
Addended by: Durwin Nora on: 02/28/2021 02:21 PM   Modules accepted: Orders

## 2021-02-28 NOTE — Patient Instructions (Signed)
Toe Fracture A toe fracture is a break in one of the toe bones (phalanges). This may happen if you: Drop a heavy object on your toe. Stub your toe. Twist your toe. Exercise the same way too much. What are the signs or symptoms? The main symptoms are swelling and pain in the toe. You may also have: Bruising. Stiffness. Numbness. A change in the way the toe looks. Broken bones that poke through the skin. Blood under the toenail. How is this treated? Treatments may include: Taping the broken toe to a toe that is next to it (buddy taping). Wearing a shoe that has a wide, rigid sole to protect the toe and to limit its movement. Wearing a cast. Surgery. This may be needed if the: Pieces of broken bone are out of place. Bone pokes through the skin. Physical therapy. Follow these instructions at home: If you have a shoe: Wear the shoe as told by your doctor. Remove it only as told by your doctor. Loosen the shoe if your toes tingle, become numb, or turn cold and blue. Keep the shoe clean and dry. If you have a cast: Do not put pressure on any part of the cast until it is fully hardened. This may take a few hours. Do not stick anything inside the cast to scratch your skin. Check the skin around the cast every day. Tell your doctor about any concerns. You may put lotion on dry skin around the edges of the cast. Do not put lotion on the skin under the cast. Keep the cast clean and dry. Bathing Do not take baths, swim, or use a hot tub until your doctor says it is okay. Ask your doctor if you can take showers. If the shoe or cast is not waterproof: Do not let it get wet. Cover it with a watertight covering when you take a bath or a shower. Activity Do not use your foot to support your body weight until your doctor says it is okay. Use crutches as told by your doctor. Ask your doctor what activities are safe for you during recovery. Avoid activities as told by your doctor. Do  exercises as told by your doctor or therapist. Driving Do not drive or use heavy machinery while taking pain medicine. Do not drive while wearing a cast on a foot that you use for driving. Managing pain, stiffness, and swelling  Put ice on the injured area if told by your doctor: Put ice in a plastic bag. Place a towel between your skin and the bag. If you have a shoe, remove it as told by your doctor. If you have a cast, place a towel between your cast and the bag. Leave the ice on for 20 minutes, 2-3 times per day. Raise (elevate) the injured area above the level of your heart while you are sitting or lying down. General instructions If your toe was taped to a toe that is next to it, follow your doctor's instructions for changing the gauze and tape. Change it more often: If the gauze and tape get wet. If this happens, dry the space between the toes. If the gauze and tape are too tight and they cause your toe to become pale or to lose feeling (go numb). If your doctor did not give you a protective shoe, wear sturdy shoes that support your foot. Your shoes should not: Pinch your toes. Fit tightly against your toes. Do not use any tobacco products, including cigarettes, chewing tobacco, or e-cigarettes.   These can delay bone healing. If you need help quitting, ask your doctor. Take medicines only as told by your doctor. Keep all follow-up visits as told by your doctor. This is important. Contact a doctor if: Your pain medicine is not helping. You have a fever. You notice a bad smell coming from your cast. Get help right away if: You lose feeling (have numbness) in your toe or foot, and it is getting worse. Your toe or your foot tingles. Your toe or your foot gets cold or turns blue. You have redness or swelling in your toe or foot, and it is getting worse. You have very bad pain. Summary A toe fracture is a break in one of the toe bones. Use ice and raise your foot. This will help  lessen pain and swelling. Use crutches as told by your doctor. This information is not intended to replace advice given to you by your health care provider. Make sure you discuss any questions you have with your health care provider. Document Revised: 10/10/2020 Document Reviewed: 07/28/2017 Elsevier Patient Education  2022 [employer].

## 2021-02-28 NOTE — Progress Notes (Signed)
Crystal [name] 70 y.o.   Chief Complaint  Patient presents with   Toe Injury    Pt hit 4th toe on corner of her desk, yesterday. Swelling and bruised.    HISTORY OF PRESENT ILLNESS: This is a 70 y.o. female complaining of injury to right fourth toe complaining of swelling and pain. Also has bruising, on Eliquis.  HPI   Prior to Admission medications   Medication Sig Start Date End Date Taking? Authorizing Provider  acetaminophen (TYLENOL) 325 MG tablet Take 325 mg by mouth as needed for moderate pain or headache.    [provider]  apixaban (ELIQUIS) 5 MG TABS tablet Take 1 tablet (5 mg total) by mouth 2 (two) times daily. 08/30/20   Camnitz, Ocie Doyne, MD  Calcium Carbonate-Vitamin D 600-400 MG-UNIT tablet Take 1 tablet by mouth daily.    [provider]  cetirizine (ZYRTEC) 10 MG tablet Take 10 mg by mouth daily. 06/19/20   [provider]  Cholecalciferol (VITAMIN D3) 1000 UNITS CAPS Take 1,000 Units by mouth daily.    [provider]  flecainide (TAMBOCOR) 50 MG tablet Take 1 tablet (50 mg total) by mouth 2 (two) times daily. 11/29/20   Camnitz, Ocie Doyne, MD  methimazole (TAPAZOLE) 10 MG tablet Take 1 tablet (10 mg total) by mouth daily. 12/19/20   Renato Shin, MD  metoprolol succinate (TOPROL-XL) 25 MG 24 hr tablet Take 1 tablet (25 mg total) by mouth in the morning and at bedtime. 01/04/21   Camnitz, Ocie Doyne, MD  Multiple Vitamin (MULTIVITAMIN) tablet Take 1 tablet by mouth daily.    [provider]  Polyethyl Glycol-Propyl Glycol (SYSTANE OP) Place 1 drop into both eyes daily as needed (For dryness.).     [provider]  Probiotic Product (FORTIFY DAILY PROBIOTIC PO) Take 1 capsule by mouth daily.     [provider]    No Known Allergies  Patient Active Problem List   Diagnosis Date Noted   Hyperthyroidism 10/28/2020   Weight loss 09/04/2020   Lung nodule 09/04/2020   Paroxysmal atrial fibrillation  with rapid ventricular response ([place]) 01/06/2020   Spider veins of both lower extremities 05/09/2019   HX: breast cancer 10/24/2014   Routine health maintenance 03/30/2013   PARESTHESIA 06/25/2010    Past Medical History:  Diagnosis Date   BACK PAIN, LUMBAR    Breast cancer of lower-outer quadrant of right female breast ([place]) 10/24/2014   Breast cancer, right breast    FIBROADENOMA, BREAST 2006   New onset atrial fibrillation ([place]) 01/06/2020   Palpitations 01/06/2020   PARESTHESIA    toes   PONV (postoperative nausea and vomiting)    Radiation 11/23/14-12/25/14   right breast 42.72 gray, lumpectomy cavity boosted to 54.72 gray   Routine health maintenance 03/30/2013   Colonoscopy - for colonoscopy (Sept '14)  Immunization  Tdap Oct '14    Spider veins of both lower extremities 05/09/2019   Wears glasses     Past Surgical History:  Procedure Laterality Date   ATRIAL FIBRILLATION ABLATION N/A 05/25/2020   Procedure: ATRIAL FIBRILLATION ABLATION;  Surgeon: Constance Haw, MD;  Location: [facility];  Service: Cardiovascular;  Laterality: N/A;   atrophic vaginitis     BREAST LUMPECTOMY WITH RADIOACTIVE SEED LOCALIZATION Right 10/18/2014   Procedure: BREAST LUMPECTOMY WITH RADIOACTIVE SEED LOCALIZATION;  Surgeon: Coralie Keens, MD;  Location: [facility];  Service: General;  Laterality: Right;   COLONOSCOPY  09/05/2013  D & C and hysteroscopy  11/1998   for uterine polyp   DILATATION & CURRETTAGE/HYSTEROSCOPY WITH RESECTOCOPE N/A 05/30/2015   Procedure: [place];  Surgeon: Princess Bruins, MD;  Location: [facility];  Service: Gynecology;  Laterality: N/A;   DILATION AND CURETTAGE OF UTERUS     Sterotactix needle biopsy  2000   breast calcification    Social History   Socioeconomic History   Marital status: Married    Spouse name: howard   Number of children: 2   Years of education: 16   Highest education level:  Not on file  Occupational History   Occupation: [employer]    Employer: [employer]  Tobacco Use   Smoking status: Never   Smokeless tobacco: Never  Vaping Use   Vaping Use: Never used  Substance and Sexual Activity   Alcohol use: Yes    Alcohol/week: 5.0 standard drinks    Types: 5 Standard drinks or equivalent per week    Comment: 2 beers/week   Drug use: No   Sexual activity: Yes    Partners: Male    Comment: 1st intercourse- 20, partners-  married-  70 yrs   Other Topics Concern   Not on file  Social History Narrative   HSG, [place], Married '78,  1 son '81, 1 daughter '84, 1 grandson '12.    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 ACP - discussed with patient.    Refer - [email or web address]   Highest level of education:  BS   Right handed   Two story home   Social Determinants of Health   Financial Resource Strain: Not on file  Food Insecurity: Not on file  Transportation Needs: Not on file  Physical Activity: Not on file  Stress: Not on file  Social Connections: Not on file  Intimate Partner Violence: Not on file    Family History  Problem Relation Age of Onset   Hypothyroidism Mother    GER disease Mother    Osteoporosis Mother    Hypertension Mother    Cancer Mother        hodgkins lympoma, uterine cancer   Arrhythmia Mother    Hyperlipidemia Father    Hypertension Father    Diabetes Father    Coronary artery disease Father    Coronary artery disease Maternal Grandfather    COPD Neg Hx    Asthma Neg Hx    Colon cancer Neg Hx    Esophageal cancer Neg Hx    Rectal cancer Neg Hx    Stomach cancer Neg Hx      Review of Systems  Constitutional: Negative.  Negative for chills and fever.  HENT: Negative.  Negative for congestion and sore throat.   Respiratory: Negative.  Negative for cough and shortness of breath.   Cardiovascular: Negative.  Negative for chest pain and palpitations.   Gastrointestinal:  Negative for abdominal pain, diarrhea, nausea and vomiting.  Musculoskeletal:  Negative for myalgias.  Neurological:  Negative for dizziness and headaches.    Physical Exam Constitutional:      Appearance: Normal appearance.  HENT:     Head: Normocephalic.  Cardiovascular:     Rate and Rhythm: Normal rate.  Pulmonary:     Effort: Pulmonary effort is normal.  Musculoskeletal:     Comments: Right fourth toe: Positive erythema and swelling with surrounding ecchymosis  Skin:    General: Skin is warm and dry.  Neurological:  General: No focal deficit present.     Mental Status: She is alert and oriented to person, place, and time.  Psychiatric:        Mood and Affect: Mood normal.        Behavior: Behavior normal.   DG Toe 4th Right  Result Date: 02/28/2021 CLINICAL DATA:  Toe injury EXAM: RIGHT FOURTH TOE COMPARISON:  None. FINDINGS: Nondisplaced oblique fracture of the diaphysis of the proximal fourth toe phalanx. There is no evidence of arthropathy or other focal bone abnormality. Soft tissues are unremarkable. IMPRESSION: Nondisplaced oblique fracture of the diaphysis of the proximal fourth toe phalanx. Electronically Signed   By: Yetta Glassman M.D.   On: 02/28/2021 13:44     ASSESSMENT & PLAN: Robertia was seen today for toe injury.  Diagnoses and all orders for this visit:  Toe injury, right, initial encounter -     DG Toe 4th Right  Closed nondisplaced fracture of middle phalanx of lesser toe of right foot, initial encounter  Pain of toe of right foot  Buddy tape toes.  Patient has orthopedic shoe at home.  Advised to use it for the next 2 weeks. Tylenol as needed for pain. Patient Instructions  Toe Fracture A toe fracture is a break in one of the toe bones (phalanges). This may happen if you: Drop a heavy object on your toe. Stub your toe. Twist your toe. Exercise the same way too much. What are the signs or symptoms? The main symptoms  are swelling and pain in the toe. You may also have: Bruising. Stiffness. Numbness. A change in the way the toe looks. Broken bones that poke through the skin. Blood under the toenail. How is this treated? Treatments may include: Taping the broken toe to a toe that is next to it (buddy taping). Wearing a shoe that has a wide, rigid sole to protect the toe and to limit its movement. Wearing a cast. Surgery. This may be needed if the: Pieces of broken bone are out of place. Bone pokes through the skin. Physical therapy. Follow these instructions at home: If you have a shoe: Wear the shoe as told by your doctor. Remove it only as told by your doctor. Loosen the shoe if your toes tingle, become numb, or turn cold and blue. Keep the shoe clean and dry. If you have a cast: Do not put pressure on any part of the cast until it is fully hardened. This may take a few hours. Do not stick anything inside the cast to scratch your skin. Check the skin around the cast every day. Tell your doctor about any concerns. You may put lotion on dry skin around the edges of the cast. Do not put lotion on the skin under the cast. Keep the cast clean and dry. Bathing Do not take baths, swim, or use a hot tub until your doctor says it is okay. Ask your doctor if you can take showers. If the shoe or cast is not waterproof: Do not let it get wet. Cover it with a watertight covering when you take a bath or a shower. Activity Do not use your foot to support your body weight until your doctor says it is okay. Use crutches as told by your doctor. Ask your doctor what activities are safe for you during recovery. Avoid activities as told by your doctor. Do exercises as told by your doctor or therapist. Driving Do not drive or use heavy machinery while taking pain medicine.  Do not drive while wearing a cast on a foot that you use for driving. Managing pain, stiffness, and swelling  Put ice on the injured  area if told by your doctor: Put ice in a plastic bag. Place a towel between your skin and the bag. If you have a shoe, remove it as told by your doctor. If you have a cast, place a towel between your cast and the bag. Leave the ice on for 20 minutes, 2-3 times per day. Raise (elevate) the injured area above the level of your heart while you are sitting or lying down. General instructions If your toe was taped to a toe that is next to it, follow your doctor's instructions for changing the gauze and tape. Change it more often: If the gauze and tape get wet. If this happens, dry the space between the toes. If the gauze and tape are too tight and they cause your toe to become pale or to lose feeling (go numb). If your doctor did not give you a protective shoe, wear sturdy shoes that support your foot. Your shoes should not: Pinch your toes. Fit tightly against your toes. Do not use any tobacco products, including cigarettes, chewing tobacco, or e-cigarettes. These can delay bone healing. If you need help quitting, ask your doctor. Take medicines only as told by your doctor. Keep all follow-up visits as told by your doctor. This is important. Contact a doctor if: Your pain medicine is not helping. You have a fever. You notice a bad smell coming from your cast. Get help right away if: You lose feeling (have numbness) in your toe or foot, and it is getting worse. Your toe or your foot tingles. Your toe or your foot gets cold or turns blue. You have redness or swelling in your toe or foot, and it is getting worse. You have very bad pain. Summary A toe fracture is a break in one of the toe bones. Use ice and raise your foot. This will help lessen pain and swelling. Use crutches as told by your doctor. This information is not intended to replace advice given to you by your health care provider. Make sure you discuss any questions you have with your health care provider. Document Revised:  10/10/2020 Document Reviewed: 07/28/2017 Elsevier Patient Education  2022 [place], MD [facility]

## 2021-03-05 ENCOUNTER — Other Ambulatory Visit: Payer: Self-pay

## 2021-03-05 ENCOUNTER — Encounter: Payer: Self-pay | Admitting: Cardiology

## 2021-03-05 ENCOUNTER — Ambulatory Visit (INDEPENDENT_AMBULATORY_CARE_PROVIDER_SITE_OTHER): Payer: Medicare Other | Admitting: Cardiology

## 2021-03-05 VITALS — BP 112/64 | HR 62 | Ht 70.0 in | Wt 149.2 lb

## 2021-03-05 DIAGNOSIS — I480 Paroxysmal atrial fibrillation: Secondary | ICD-10-CM

## 2021-03-05 MED ORDER — METOPROLOL SUCCINATE ER 50 MG PO TB24: 50.0000 mg | ORAL_TABLET | Freq: Every day | ORAL | 3 refills | Status: DC

## 2021-03-05 NOTE — Progress Notes (Signed)
Electrophysiology Office Note   Date:  03/05/2021   ID:  Crystal [name], DOB 12-30-1950, MRN [id number]  PCP:  Hoyt Koch, MD  Cardiologist: Burt Knack Primary Electrophysiologist:  Altariq Goodall Meredith Leeds, MD    Chief Complaint: Atrial fibrillation   History of Present Illness: Crystal [name] is a 70 y.o. female who is being seen today for the evaluation of atrial fibrillation at the request of Hoyt Koch, *. Presenting today for electrophysiology evaluation.  She has a history significant for right-sided breast cancer and paroxysmal atrial fibrillation.  He was diagnosed with atrial fibrillation 01/18/2020.  She was started on metoprolol and Eliquis.  ZIO monitor showed a 5% burden.  She is status post ablation 05/25/2020.  Today, denies symptoms of palpitations, chest pain, shortness of breath, orthopnea, PND, lower extremity edema, claudication, dizziness, presyncope, syncope, bleeding, or neurologic sequela. The patient is tolerating medications without difficulties.  Overall she is feeling well.  She has no chest pain shortness of breath.  She has had minimal episodes of atrial fibrillation since last being seen.  Her only episode was this past weekend she had atrial fibrillation a few times.  Her heart rate got to 110 she had not had, throughout the weekend.  There were no triggers that she can determine.  Past Medical History:  Diagnosis Date   BACK PAIN, LUMBAR    Breast cancer of lower-outer quadrant of right female breast ([place]) 10/24/2014   Breast cancer, right breast    FIBROADENOMA, BREAST 2006   New onset atrial fibrillation ([place]) 01/06/2020   Palpitations 01/06/2020   PARESTHESIA    toes   PONV (postoperative nausea and vomiting)    Radiation 11/23/14-12/25/14   right breast 42.72 gray, lumpectomy cavity boosted to 54.72 gray   Routine health maintenance 03/30/2013   Colonoscopy - for colonoscopy (Sept '14)  Immunization  Tdap Oct '14    Spider veins of  both lower extremities 05/09/2019   Wears glasses    Past Surgical History:  Procedure Laterality Date   ATRIAL FIBRILLATION ABLATION N/A 05/25/2020   Procedure: ATRIAL FIBRILLATION ABLATION;  Surgeon: Constance Haw, MD;  Location: [facility];  Service: Cardiovascular;  Laterality: N/A;   atrophic vaginitis     BREAST LUMPECTOMY WITH RADIOACTIVE SEED LOCALIZATION Right 10/18/2014   Procedure: BREAST LUMPECTOMY WITH RADIOACTIVE SEED LOCALIZATION;  Surgeon: Coralie Keens, MD;  Location: [facility];  Service: General;  Laterality: Right;   COLONOSCOPY  09/05/2013   D & C and hysteroscopy  11/1998   for uterine polyp   DILATATION & CURRETTAGE/HYSTEROSCOPY WITH RESECTOCOPE N/A 05/30/2015   Procedure: [place];  Surgeon: Princess Bruins, MD;  Location: [facility];  Service: Gynecology;  Laterality: N/A;   DILATION AND CURETTAGE OF UTERUS     Sterotactix needle biopsy  2000   breast calcification     Current Outpatient Medications  Medication Sig Dispense Refill   acetaminophen (TYLENOL) 325 MG tablet Take 325 mg by mouth as needed for moderate pain or headache.     apixaban (ELIQUIS) 5 MG TABS tablet Take 1 tablet (5 mg total) by mouth 2 (two) times daily. 90 tablet 1   Calcium Carbonate-Vitamin D 600-400 MG-UNIT tablet Take 1 tablet by mouth daily.     cetirizine (ZYRTEC) 10 MG tablet Take 10 mg by mouth daily.     Cholecalciferol (VITAMIN D3) 1000 UNITS CAPS Take 1,000 Units by mouth daily.     flecainide (TAMBOCOR)  50 MG tablet Take 1 tablet (50 mg total) by mouth 2 (two) times daily. 60 tablet 3   methimazole (TAPAZOLE) 10 MG tablet Take 1 tablet (10 mg total) by mouth daily. 90 tablet 3   metoprolol succinate (TOPROL-XL) 50 MG 24 hr tablet Take 1 tablet (50 mg total) by mouth daily. Take with or immediately following a meal. 90 tablet 3   Multiple Vitamin (MULTIVITAMIN) tablet Take 1 tablet by mouth daily.      Polyethyl Glycol-Propyl Glycol (SYSTANE OP) Place 1 drop into both eyes daily as needed (For dryness.).      Probiotic Product (FORTIFY DAILY PROBIOTIC PO) Take 1 capsule by mouth daily.      No current facility-administered medications for this visit.    Allergies:   Patient has no known allergies.   Social History:  The patient  reports that she has never smoked. She has never used smokeless tobacco. She reports current alcohol use of about 5.0 standard drinks per week. She reports that she does not use drugs.   Family History:  The patient's family history includes Arrhythmia in her mother; Cancer in her mother; Coronary artery disease in her father and maternal grandfather; Diabetes in her father; GER disease in her mother; Hyperlipidemia in her father; Hypertension in her father and mother; Hypothyroidism in her mother; Osteoporosis in her mother.   ROS:  Please see the history of present illness.   Otherwise, review of systems is positive for none.   All other systems are reviewed and negative.   PHYSICAL EXAM: VS:  BP 112/64   Pulse 62   Ht '5\' 10"'$  (1.778 m)   Wt 149 lb 3.2 oz (67.7 kg)   SpO2 98%   BMI 21.41 kg/m  , BMI Body mass index is 21.41 kg/m. GEN: Well nourished, well developed, in no acute distress  HEENT: normal  Neck: no JVD, carotid bruits, or masses Cardiac: RRR; no murmurs, rubs, or gallops,no edema  Respiratory:  clear to auscultation bilaterally, normal work of breathing GI: soft, nontender, nondistended, + BS MS: no deformity or atrophy  Skin: warm and dry Neuro:  Strength and sensation are intact Psych: euthymic mood, full affect  EKG:  EKG is ordered today. Personal review of the ekg ordered shows sinus rhythm, rate 62  Recent Labs: 09/04/2020: ALT 80; BUN 22; Creatinine, Ser 0.83; Hemoglobin 11.9; Platelets 235.0; Potassium 4.3; Sodium 140 12/19/2020: TSH 4.36    Lipid Panel     Component Value Date/Time   CHOL 147 09/04/2020 0832   TRIG 138.0  09/04/2020 0832   HDL 57.30 09/04/2020 0832   CHOLHDL 3 09/04/2020 0832   VLDL 27.6 09/04/2020 0832   LDLCALC 62 09/04/2020 0832     Wt Readings from Last 3 Encounters:  03/05/21 149 lb 3.2 oz (67.7 kg)  02/28/21 149 lb (67.6 kg)  01/28/21 148 lb (67.1 kg)      Other studies Reviewed: Additional studies/ records that were reviewed today include: TTE 01/31/20  Review of the above records today demonstrates:   1. Left ventricular ejection fraction, by estimation, is 60 to 65%. The  left ventricle has normal function. The left ventricle has no regional  wall motion abnormalities. Left ventricular diastolic parameters were  normal.   2. Right ventricular systolic function is normal. The right ventricular  size is normal. There is normal pulmonary artery systolic pressure.   3. The mitral valve is normal in structure. Trivial mitral valve  regurgitation. No evidence of mitral  stenosis.   4. The aortic valve is tricuspid. Aortic valve regurgitation is trivial.  No aortic stenosis is present.   5. The inferior vena cava is normal in size with greater than 50%  respiratory variability, suggesting right atrial pressure of 3 mmHg.   Monitor 02/21/20 personally reviewed The basic rhythm is normal sinus with an average HR of 71 bpm Atrial fibrillation with rapid ventricular rate occurs with a 5% burden No high-grade heart block or pathologic pauses No bradycardic events No significant ventricular ectopy    ASSESSMENT AND PLAN:  1.  Paroxysmal atrial fibrillation: CHA2DS2-VASc of 2.  Status post ablation 05/25/2020.  Currently on flecainide 50 mg twice daily.  High risk medication monitoring.  Anticoagulation with Eliquis 5 mg twice daily.  She is currently on Toprol-XL 25 mg twice daily.  She is having some fatigue during today.  We Zakaree Mcclenahan consolidate to Toprol-XL 50 mg daily.  She has had a few further episodes of atrial fibrillation.  She has follow-up with her endocrinologist later this  week.  Thyroid is normal, Mayford Alberg potentially increase flecainide versus  Current medicines are reviewed at length with the patient today.   The patient does not have concerns regarding her medicines.  The following changes were made today: Increase Toprol-XL  Labs/ tests ordered today include:  Orders Placed This Encounter  Procedures   EKG 12-Lead      Disposition:   FU with Ople Girgis 6 months  Signed, Justyna Timoney Meredith Leeds, MD  03/05/2021 2:31 PM     [place] [address] [phone] (office) [phone] (fax)

## 2021-03-05 NOTE — Patient Instructions (Signed)
Medication Instructions:  Your physician has recommended you make the following change in your medication:  CHANGE how you take Toprol -- take 50 mg at bedtime once daily  *If you need a refill on your cardiac medications before your next appointment, please call your pharmacy*   Lab Work: None ordered   Testing/Procedures: None ordered   Follow-Up: At [facility], you and your health needs are our priority.  As part of our continuing mission to provide you with exceptional heart care, we have created designated Provider Care Teams.  These Care Teams include your primary Cardiologist (physician) and Advanced Practice Providers (APPs -  Physician Assistants and Nurse Practitioners) who all work together to provide you with the care you need, when you need it.  Your next appointment:   6 month(s)  The format for your next appointment:   In Person  Provider:   Allegra Lai, MD    Thank you for choosing [facility]!!   Trinidad Curet, RN [phone]

## 2021-03-08 ENCOUNTER — Ambulatory Visit (INDEPENDENT_AMBULATORY_CARE_PROVIDER_SITE_OTHER): Payer: Medicare Other | Admitting: Endocrinology

## 2021-03-08 ENCOUNTER — Other Ambulatory Visit: Payer: Self-pay

## 2021-03-08 VITALS — BP 122/80 | HR 62 | Ht 70.0 in | Wt 148.6 lb

## 2021-03-08 DIAGNOSIS — E0590 Thyrotoxicosis, unspecified without thyrotoxic crisis or storm: Secondary | ICD-10-CM

## 2021-03-08 LAB — T4, FREE: Free T4: 0.53 ng/dL — ABNORMAL LOW (ref 0.60–1.60)

## 2021-03-08 LAB — TSH: TSH: 16.04 u[IU]/mL — ABNORMAL HIGH (ref 0.35–5.50)

## 2021-03-08 MED ORDER — METHIMAZOLE 5 MG PO TABS: 5.0000 mg | ORAL_TABLET | Freq: Every day | ORAL | 3 refills | Status: DC

## 2021-03-08 NOTE — Progress Notes (Signed)
Subjective:    Patient ID: Crystal [name], female    DOB: 10/04/1950, 70 y.o.   MRN: [id number]  HPI Pt returns for f/u of hyperthyroidism (dx'ed 2021; she has never had thyroid imaging; tapazole was chosen as rx, due to AF).  pt states she feels well in general.  Specifically, she denies tremor.  She takes meds as rx'ed.   Past Medical History:  Diagnosis Date   BACK PAIN, LUMBAR    Breast cancer of lower-outer quadrant of right female breast ([place]) 10/24/2014   Breast cancer, right breast    FIBROADENOMA, BREAST 2006   New onset atrial fibrillation ([place]) 01/06/2020   Palpitations 01/06/2020   PARESTHESIA    toes   PONV (postoperative nausea and vomiting)    Radiation 11/23/14-12/25/14   right breast 42.72 gray, lumpectomy cavity boosted to 54.72 gray   Routine health maintenance 03/30/2013   Colonoscopy - for colonoscopy (Sept '14)  Immunization  Tdap Oct '14    Spider veins of both lower extremities 05/09/2019   Wears glasses     Past Surgical History:  Procedure Laterality Date   ATRIAL FIBRILLATION ABLATION N/A 05/25/2020   Procedure: ATRIAL FIBRILLATION ABLATION;  Surgeon: Constance Haw, MD;  Location: [facility];  Service: Cardiovascular;  Laterality: N/A;   atrophic vaginitis     BREAST LUMPECTOMY WITH RADIOACTIVE SEED LOCALIZATION Right 10/18/2014   Procedure: BREAST LUMPECTOMY WITH RADIOACTIVE SEED LOCALIZATION;  Surgeon: Coralie Keens, MD;  Location: [facility];  Service: General;  Laterality: Right;   COLONOSCOPY  09/05/2013   D & C and hysteroscopy  11/1998   for uterine polyp   DILATATION & CURRETTAGE/HYSTEROSCOPY WITH RESECTOCOPE N/A 05/30/2015   Procedure: [place];  Surgeon: Princess Bruins, MD;  Location: [facility];  Service: Gynecology;  Laterality: N/A;   DILATION AND CURETTAGE OF UTERUS     Sterotactix needle biopsy  2000   breast calcification    Social History   Socioeconomic History    Marital status: Married    Spouse name: howard   Number of children: 2   Years of education: 16   Highest education level: Not on file  Occupational History   Occupation: PROPERTY M[employer]    Employer: [employer]  Tobacco Use   Smoking status: Never   Smokeless tobacco: Never  Vaping Use   Vaping Use: Never used  Substance and Sexual Activity   Alcohol use: Yes    Alcohol/week: 5.0 standard drinks    Types: 5 Standard drinks or equivalent per week    Comment: 2 beers/week   Drug use: No   Sexual activity: Yes    Partners: Male    Comment: 1st intercourse- 54, partners-  married-  38 yrs   Other Topics Concern   Not on file  Social History Narrative   HSG, [place], Married '78,  1 son '81, 1 daughter '84, 1 grandson '12.    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 ACP - discussed with patient.    Refer - [email or web address]   Highest level of education:  BS   Right handed   Two story home   Social Determinants of Health   Financial Resource Strain: Not on file  Food Insecurity: Not on file  Transportation Needs: Not on file  Physical Activity: Not on file  Stress: Not on file  Social Connections: Not on file  Intimate Partner Violence: Not on  file    Current Outpatient Medications on File Prior to Visit  Medication Sig Dispense Refill   acetaminophen (TYLENOL) 325 MG tablet Take 325 mg by mouth as needed for moderate pain or headache.     apixaban (ELIQUIS) 5 MG TABS tablet Take 1 tablet (5 mg total) by mouth 2 (two) times daily. 90 tablet 1   Calcium Carbonate-Vitamin D 600-400 MG-UNIT tablet Take 1 tablet by mouth daily.     cetirizine (ZYRTEC) 10 MG tablet Take 10 mg by mouth daily.     Cholecalciferol (VITAMIN D3) 1000 UNITS CAPS Take 1,000 Units by mouth daily.     flecainide (TAMBOCOR) 50 MG tablet Take 1 tablet (50 mg total) by mouth 2 (two) times daily. 60 tablet 3   metoprolol succinate (TOPROL-XL) 50 MG  24 hr tablet Take 1 tablet (50 mg total) by mouth daily. Take with or immediately following a meal. 90 tablet 3   Multiple Vitamin (MULTIVITAMIN) tablet Take 1 tablet by mouth daily.     Polyethyl Glycol-Propyl Glycol (SYSTANE OP) Place 1 drop into both eyes daily as needed (For dryness.).      Probiotic Product (FORTIFY DAILY PROBIOTIC PO) Take 1 capsule by mouth daily.      No current facility-administered medications on file prior to visit.    No Known Allergies  Family History  Problem Relation Age of Onset   Hypothyroidism Mother    GER disease Mother    Osteoporosis Mother    Hypertension Mother    Cancer Mother        hodgkins lympoma, uterine cancer   Arrhythmia Mother    Hyperlipidemia Father    Hypertension Father    Diabetes Father    Coronary artery disease Father    Coronary artery disease Maternal Grandfather    COPD Neg Hx    Asthma Neg Hx    Colon cancer Neg Hx    Esophageal cancer Neg Hx    Rectal cancer Neg Hx    Stomach cancer Neg Hx     BP 122/80 (BP Location: Right Arm, Patient Position: Sitting, Cuff Size: Normal)   Pulse 62   Ht '5\' 10"'$  (1.778 m)   Wt 148 lb 9.6 oz (67.4 kg)   SpO2 99%   BMI 21.32 kg/m    Review of Systems Denies fever    Objective:   Physical Exam VITAL SIGNS:  See vs page GENERAL: no distress NECK: There is no palpable thyroid enlargement.  No thyroid nodule is palpable.  No palpable lymphadenopathy at the anterior neck.   Lab Results  Component Value Date   CREATININE 0.83 09/04/2020   BUN 22 09/04/2020   NA 140 09/04/2020   K 4.3 09/04/2020   CL 103 09/04/2020   CO2 31 09/04/2020   Lab Results  Component Value Date   CALCIUM 9.8 09/04/2020   Lab Results  Component Value Date   TSH 16.04 (H) 03/08/2021      Assessment & Plan:  Hyperthyroidism: overcontrolled D/c tapazole In 10 days, resume at 5 mg qd.   Please come back for a follow-up appointment in 2 months.

## 2021-03-08 NOTE — Patient Instructions (Signed)
Blood tests are requested for you today.  We'll let you know about the results.  If ever you have fever while taking methimazole, stop it and call us, even if the reason is obvious, because of the risk of a rare side-effect. It is best to never miss the medication.  However, if you do miss it, next best is to double up the next time.   Please come back for a follow-up appointment in 2 months.  

## 2021-03-24 ENCOUNTER — Other Ambulatory Visit: Payer: Self-pay | Admitting: Cardiology

## 2021-03-25 NOTE — Telephone Encounter (Signed)
Pt last saw Dr Curt Bears 03/05/21, last labs 09/04/20 Creat 0.83, age 70, weight 67.4kg, based on specified criteria pt is on appropriate dosage of Eliquis 5mg  BID for afib.  Will refill rx.

## 2021-03-26 MED ORDER — APIXABAN 5 MG PO TABS: 5.0000 mg | ORAL_TABLET | Freq: Two times a day (BID) | ORAL | 1 refills | Status: DC

## 2021-03-29 ENCOUNTER — Other Ambulatory Visit: Payer: Self-pay

## 2021-03-29 MED ORDER — FLECAINIDE ACETATE 50 MG PO TABS: 50.0000 mg | ORAL_TABLET | Freq: Two times a day (BID) | ORAL | 11 refills | Status: DC

## 2021-03-29 NOTE — Telephone Encounter (Signed)
Pt's medication was sent to pt's pharmacy as requested. Confirmation received.  °

## 2021-04-01 ENCOUNTER — Encounter: Payer: Self-pay | Admitting: Obstetrics & Gynecology

## 2021-05-10 ENCOUNTER — Ambulatory Visit (INDEPENDENT_AMBULATORY_CARE_PROVIDER_SITE_OTHER): Payer: Medicare Other | Admitting: Endocrinology

## 2021-05-10 ENCOUNTER — Other Ambulatory Visit: Payer: Self-pay

## 2021-05-10 VITALS — BP 130/70 | HR 66 | Ht 70.0 in | Wt 152.8 lb

## 2021-05-10 DIAGNOSIS — E0590 Thyrotoxicosis, unspecified without thyrotoxic crisis or storm: Secondary | ICD-10-CM | POA: Diagnosis not present

## 2021-05-10 LAB — TSH: TSH: 2.51 u[IU]/mL (ref 0.35–5.50)

## 2021-05-10 LAB — T4, FREE: Free T4: 0.85 ng/dL (ref 0.60–1.60)

## 2021-05-10 NOTE — Progress Notes (Signed)
Subjective:    Patient ID: Crystal [name], female    DOB: 10/12/1950, 70 y.o.   MRN: [id number]  HPI Pt returns for f/u of hyperthyroidism (dx'ed 2021; she has never had thyroid imaging; tapazole was chosen as rx, due to AF).  pt states she feels well in general, except for weight gain. She takes meds as rx'ed.   Past Medical History:  Diagnosis Date   BACK PAIN, LUMBAR    Breast cancer of lower-outer quadrant of right female breast ([place]) 10/24/2014   Breast cancer, right breast    FIBROADENOMA, BREAST 2006   New onset atrial fibrillation ([place]) 01/06/2020   Palpitations 01/06/2020   PARESTHESIA    toes   PONV (postoperative nausea and vomiting)    Radiation 11/23/14-12/25/14   right breast 42.72 gray, lumpectomy cavity boosted to 54.72 gray   Routine health maintenance 03/30/2013   Colonoscopy - for colonoscopy (Sept '14)  Immunization  Tdap Oct '14    Spider veins of both lower extremities 05/09/2019   Wears glasses     Past Surgical History:  Procedure Laterality Date   ATRIAL FIBRILLATION ABLATION N/A 05/25/2020   Procedure: ATRIAL FIBRILLATION ABLATION;  Surgeon: Constance Haw, MD;  Location: [facility];  Service: Cardiovascular;  Laterality: N/A;   atrophic vaginitis     BREAST LUMPECTOMY WITH RADIOACTIVE SEED LOCALIZATION Right 10/18/2014   Procedure: BREAST LUMPECTOMY WITH RADIOACTIVE SEED LOCALIZATION;  Surgeon: Coralie Keens, MD;  Location: [facility];  Service: General;  Laterality: Right;   COLONOSCOPY  09/05/2013   D & C and hysteroscopy  11/1998   for uterine polyp   DILATATION & CURRETTAGE/HYSTEROSCOPY WITH RESECTOCOPE N/A 05/30/2015   Procedure: [place];  Surgeon: Princess Bruins, MD;  Location: [facility];  Service: Gynecology;  Laterality: N/A;   DILATION AND CURETTAGE OF UTERUS     Sterotactix needle biopsy  2000   breast calcification    Social History   Socioeconomic History   Marital  status: Married    Spouse name: howard   Number of children: 2   Years of education: 16   Highest education level: Not on file  Occupational History   Occupation: [employer] M[employer]    Employer: [employer]  Tobacco Use   Smoking status: Never   Smokeless tobacco: Never  Vaping Use   Vaping Use: Never used  Substance and Sexual Activity   Alcohol use: Yes    Alcohol/week: 5.0 standard drinks    Types: 5 Standard drinks or equivalent per week    Comment: 2 beers/week   Drug use: No   Sexual activity: Yes    Partners: Male    Comment: 1st intercourse- 63, partners-  married-  36 yrs   Other Topics Concern   Not on file  Social History Narrative   HSG, [place], Married '78,  1 son '81, 1 daughter '84, 1 grandson '12.    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 ACP - discussed with patient.    Refer - [email or web address]   Highest level of education:  BS   Right handed   Two story home   Social Determinants of Health   Financial Resource Strain: Not on file  Food Insecurity: Not on file  Transportation Needs: Not on file  Physical Activity: Not on file  Stress: Not on file  Social Connections: Not on file  Intimate Partner Violence: Not on file  Current Outpatient Medications on File Prior to Visit  Medication Sig Dispense Refill   acetaminophen (TYLENOL) 325 MG tablet Take 325 mg by mouth as needed for moderate pain or headache.     apixaban (ELIQUIS) 5 MG TABS tablet Take 1 tablet (5 mg total) by mouth 2 (two) times daily. 90 tablet 1   Calcium Carbonate-Vitamin D 600-400 MG-UNIT tablet Take 1 tablet by mouth daily.     cetirizine (ZYRTEC) 10 MG tablet Take 10 mg by mouth daily.     Cholecalciferol (VITAMIN D3) 1000 UNITS CAPS Take 1,000 Units by mouth daily.     flecainide (TAMBOCOR) 50 MG tablet Take 1 tablet (50 mg total) by mouth 2 (two) times daily. 90 tablet 3   flecainide (TAMBOCOR) 50 MG tablet Take 1 tablet  (50 mg total) by mouth 2 (two) times daily. 60 tablet 11   methimazole (TAPAZOLE) 5 MG tablet Take 1 tablet (5 mg total) by mouth daily. 90 tablet 3   metoprolol succinate (TOPROL-XL) 50 MG 24 hr tablet Take 1 tablet (50 mg total) by mouth daily. Take with or immediately following a meal. 90 tablet 3   Multiple Vitamin (MULTIVITAMIN) tablet Take 1 tablet by mouth daily.     Polyethyl Glycol-Propyl Glycol (SYSTANE OP) Place 1 drop into both eyes daily as needed (For dryness.).      Probiotic Product (FORTIFY DAILY PROBIOTIC PO) Take 1 capsule by mouth daily.      No current facility-administered medications on file prior to visit.    No Known Allergies  Family History  Problem Relation Age of Onset   Hypothyroidism Mother    GER disease Mother    Osteoporosis Mother    Hypertension Mother    Cancer Mother        hodgkins lympoma, uterine cancer   Arrhythmia Mother    Hyperlipidemia Father    Hypertension Father    Diabetes Father    Coronary artery disease Father    Coronary artery disease Maternal Grandfather    COPD Neg Hx    Asthma Neg Hx    Colon cancer Neg Hx    Esophageal cancer Neg Hx    Rectal cancer Neg Hx    Stomach cancer Neg Hx     BP 130/70 (BP Location: Right Arm, Patient Position: Sitting, Cuff Size: Normal)   Pulse 66   Ht 5\' 10"  (1.778 m)   Wt 152 lb 12.8 oz (69.3 kg)   SpO2 98%   BMI 21.92 kg/m    Review of Systems Denies fever and palpitations.      Objective:   Physical Exam VITAL SIGNS:  See vs page GENERAL: no distress NECK: There is no palpable thyroid enlargement.  No thyroid nodule is palpable.  No palpable lymphadenopathy at the anterior neck.   Lab Results  Component Value Date   TSH 2.51 05/10/2021      Assessment & Plan:  Hyperthyroidism: well-controlled.  Please continue the same methimazole

## 2021-05-10 NOTE — Patient Instructions (Addendum)
Blood tests are requested for you today.  We'll let you know about the results.  If ever you have fever while taking methimazole, stop it and call us, even if the reason is obvious, because of the risk of a rare side-effect. It is best to never miss the medication.  However, if you do miss it, next best is to double up the next time.   Please come back for a follow-up appointment in 2 months.  

## 2021-05-30 ENCOUNTER — Ambulatory Visit (INDEPENDENT_AMBULATORY_CARE_PROVIDER_SITE_OTHER): Payer: Medicare Other | Admitting: Obstetrics & Gynecology

## 2021-05-30 ENCOUNTER — Other Ambulatory Visit: Payer: Self-pay

## 2021-05-30 ENCOUNTER — Telehealth: Payer: Self-pay

## 2021-05-30 ENCOUNTER — Encounter: Payer: Self-pay | Admitting: Obstetrics & Gynecology

## 2021-05-30 VITALS — BP 114/76 | HR 65 | Resp 16 | Ht 69.25 in | Wt 150.0 lb

## 2021-05-30 DIAGNOSIS — M8589 Other specified disorders of bone density and structure, multiple sites: Secondary | ICD-10-CM | POA: Diagnosis not present

## 2021-05-30 DIAGNOSIS — Z9189 Other specified personal risk factors, not elsewhere classified: Secondary | ICD-10-CM | POA: Diagnosis not present

## 2021-05-30 DIAGNOSIS — Z9289 Personal history of other medical treatment: Secondary | ICD-10-CM

## 2021-05-30 DIAGNOSIS — Z780 Asymptomatic menopausal state: Secondary | ICD-10-CM | POA: Diagnosis not present

## 2021-05-30 DIAGNOSIS — C50511 Malignant neoplasm of lower-outer quadrant of right female breast: Secondary | ICD-10-CM | POA: Diagnosis not present

## 2021-05-30 DIAGNOSIS — M810 Age-related osteoporosis without current pathological fracture: Secondary | ICD-10-CM | POA: Diagnosis not present

## 2021-05-30 DIAGNOSIS — Z170 Estrogen receptor positive status [ER+]: Secondary | ICD-10-CM

## 2021-05-30 DIAGNOSIS — Z01419 Encounter for gynecological examination (general) (routine) without abnormal findings: Secondary | ICD-10-CM

## 2021-05-30 NOTE — Telephone Encounter (Signed)
Repeat BD at [facility] 06/2021 Received: Today Princess Bruins, MD  [facility] Triage Osteopenia

## 2021-05-30 NOTE — Progress Notes (Signed)
Crystal [name] [birth year] [id number]   History:    70 y.o. G2P2L2 Married   RP:  Established patient presenting for annual gyn exam    HPI: Postmenopausal, well on no hormone replacement therapy.  No postmenopausal bleeding.  History of right breast cancer in situ in 2016, status post lumpectomy with radiation therapy. Finished Anastrozole 01/2020. Mammo 02/2021 Neg.  No pelvic pain.  Rarely sexually active because of pain and dryness, recommended Coconut oil.  Pap 04/2020 Neg. 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 Bowel movements normal.  Health labs with family physician.  Good body mass index at 21.99.  Enjoys walking.  Had an Ablation for AF recently, not successful, controlling on Meds.  Hyperthyroidism on Methimazole. BMD: 06-28-19  Osteopenia, T-Score -1.9.  Will repeat BD at [facility] now, Vit D, Ca++ total 1.2 g/d.  COLONOSCOPY: 09-05-13  Past medical history,surgical history, family history and social history were all reviewed and documented in the EPIC chart.  Gynecologic History No LMP recorded. Patient is postmenopausal.  Obstetric History OB History  Gravida Para Term Preterm AB Living  2 2       2   SAB IAB Ectopic Multiple Live Births               # Outcome Date GA Lbr Len/2nd Weight Sex Delivery Anes PTL Lv  2 Para           1 Para              ROS: A ROS was performed and pertinent positives and negatives are included in the history.  GENERAL: No fevers or chills. HEENT: No change in vision, no earache, sore throat or sinus congestion. NECK: No pain or stiffness. CARDIOVASCULAR: No chest pain or pressure. No palpitations. PULMONARY: No shortness of breath, cough or wheeze. GASTROINTESTINAL: No abdominal pain, nausea, vomiting or diarrhea, melena or bright red blood per rectum. GENITOURINARY: No urinary frequency, urgency, hesitancy or dysuria. MUSCULOSKELETAL: No joint or muscle pain, no back pain, no recent trauma. DERMATOLOGIC: No rash, no  itching, no lesions. ENDOCRINE: No polyuria, polydipsia, no heat or cold intolerance. No recent change in weight. HEMATOLOGICAL: No anemia or easy bruising or bleeding. NEUROLOGIC: No headache, seizures, numbness, tingling or weakness. PSYCHIATRIC: No depression, no loss of interest in normal activity or change in sleep pattern.     Exam:   BP 114/76   Pulse 65   Resp 16   Ht 5' 9.25" (1.759 m)   Wt 150 lb (68 kg)   BMI 21.99 kg/m   Body mass index is 21.99 kg/m.  General appearance : Well developed well nourished female. No acute distress HEENT: Eyes: no retinal hemorrhage or exudates,  Neck supple, trachea midline, no carotid bruits, no thyroidmegaly Lungs: Clear to auscultation, no rhonchi or wheezes, or rib retractions  Heart: Regular rate and rhythm, no murmurs or gallops Breast:Examined in sitting and supine position were symmetrical in appearance, no palpable masses or tenderness,  no skin retraction, no nipple inversion, no nipple discharge, no skin discoloration, no axillary or supraclavicular lymphadenopathy Abdomen: no palpable masses or tenderness, no rebound or guarding Extremities: no edema or skin discoloration or tenderness  Pelvic: Vulva: Normal             Vagina: No gross lesions or discharge  Cervix: No gross lesions or discharge  Uterus  AV, normal size, shape and consistency, non-tender and mobile  Adnexa  Without masses or tenderness  Anus: Normal  Assessment/Plan:  70 y.o. female for annual exam   1. Well female exam with routine gynecological exam Postmenopausal, well on no hormone replacement therapy.  No postmenopausal bleeding.  History of right breast cancer in situ in 2016, status post lumpectomy with radiation therapy. Finished Anastrozole 01/2020. Mammo 02/2021 Neg.  No pelvic pain.  Rarely sexually active because of pain and dryness, recommended Coconut oil.  Pap 04/2020 Neg. 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 Bowel  movements normal.  Health labs with family physician.  Good body mass index at 21.99.  Enjoys walking.  Had an Ablation for AF recently, not successful, controlling on Meds.  Hyperthyroidism on Methimazole. BMD: 06-28-19  Osteopenia, T-Score -1.9.  Will repeat BD at [facility] now, Vit D, Ca++ total 1.2 g/d.  COLONOSCOPY: 09-05-13  2. At risk of fracture due to osteopenia  3. Postmenopausal Postmenopausal, well on no hormone replacement therapy.  No postmenopausal bleeding.   4. Osteopenia of multiple sites BMD: 06-28-19  Osteopenia, T-Score -1.9.  Will repeat BD at [facility] now, Vit D, Ca++ total 1.2 g/d.  Continue with walking on a regular basis.  5. Malignant neoplasm of lower-outer quadrant of right breast of female, estrogen receptor positive (HCC) Finished Anastrozole.  No longer followed by [facility].  6. Personal history of other medical treatment   Princess Bruins MD, 8:24 AM 05/30/2021

## 2021-06-03 NOTE — Telephone Encounter (Signed)
Order faxed to [facility] on 05/31/21.

## 2021-06-26 NOTE — Telephone Encounter (Signed)
Called patient and left message in voice mail that BD order faxed to [facility] and if they have not yet reached her to schedule she can call their office to make the appointment for January. I provided her with the [facility] phone # and asked her to call me if any questions.

## 2021-07-02 NOTE — Progress Notes (Signed)
Follow-up Visit   Date: 07/03/21   Crystal [name]   Interim History: Crystal [name] is a 71 y.o. right-handed Caucasian female with right breast cancer s/p radiation (2016), and hyperthyroidism, and atrial fibrillation returning to the clinic for follow-up of bilateral toe numbness/tingling.  The patient was accompanied to the clinic by self.  History of present illness: Starting around 2011, she started having tingling of the tips of her toes.  She saw Dr. Linda Hedges and was told she may have early signs of neuropathy.  During the summer of 2018, she starting having burning sensation over the toes and the pad of the feet.   She recalls having to take her shoes off her feet because of severe pain.  Lately, she does not have severe pain, but continues to have tingling and numbness of the toes, which is worse on the left.  She denies any imbalance, falls, or weakness.    She denies any history of diabetes mellitus, alcohol abuse, or family history neuropathy.   UPDATE 09/01/2018:  She had normal EDX of the legs performed in January. She continues to have tingling over over the balls of the feet and tips of the toes, which is worse in the evening.  Symptoms are not painful, but moreso annoying.  She does not have weakness or imbalance, but is aware that certain poses in yoga are more difficult.  She continues to work and is otherwise very healthy.   UPDATE 02/13/2020:  She is here and reports having a one week spell of worsening burning pain over the balls of the feet and toes, worse than the left foot. Symptoms were intense for a few days and improved without intervention.  Now, she is back to the usual level of discomfort and numbness in the feet.  She also complaints of muscle cramps in the lower legs.   UPDATE 07/03/2021: She is here for follow-up.  Over the past year, there has been no significant change in burning pain over the feet and toes, worse on the  left.  She has noticed that symptoms are worse in the evening.  Balance is good. She tripped over a curb and had one fall. No weakness. She continues to have intermittent leg cramps, which occurs once every few months.  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    Medications:  Current Outpatient Medications on File Prior to Visit  Medication Sig Dispense Refill   acetaminophen (TYLENOL) 325 MG tablet Take 325 mg by mouth as needed for moderate pain or headache.     apixaban (ELIQUIS) 5 MG TABS tablet Take 1 tablet (5 mg total) by mouth 2 (two) times daily. 90 tablet 1   Calcium Carbonate-Vitamin D 600-400 MG-UNIT tablet Take 1 tablet by mouth daily.     cetirizine (ZYRTEC) 10 MG tablet Take 10 mg by mouth daily.     Cholecalciferol (VITAMIN D3) 1000 UNITS CAPS Take 1,000 Units by mouth daily.     flecainide (TAMBOCOR) 50 MG tablet Take 1 tablet (50 mg total) by mouth 2 (two) times daily. 90 tablet 3   methimazole (TAPAZOLE) 5 MG tablet Take 1 tablet (5 mg total) by mouth daily. 90 tablet 3   metoprolol succinate (TOPROL-XL) 50 MG 24 hr tablet Take 1 tablet (50 mg total) by mouth daily. Take with or immediately following a meal. 90 tablet 3   Multiple Vitamin (MULTIVITAMIN) tablet Take 1 tablet by mouth daily.  Polyethyl Glycol-Propyl Glycol (SYSTANE OP) Place 1 drop into both eyes daily as needed (For dryness.).      Probiotic Product (FORTIFY DAILY PROBIOTIC PO) Take 1 capsule by mouth daily.      No current facility-administered medications on file prior to visit.    Allergies: No Known Allergies  Vital Signs:  BP 119/61    Pulse 74    Ht 5' 9.5" (1.765 m)    Wt 152 lb (68.9 kg)    SpO2 98%    BMI 22.12 kg/m   Neurological Exam: MENTAL STATUS including orientation to time, place, person, recent and remote memory, attention span and concentration, language, and fund of knowledge is normal.  Speech is not dysarthric.  MOTOR:  Motor strength is 5/5 in  all extremities, including distally in the feet.  No pronator drift.  Tone is normal.    MSRs:  Reflexes are 2+/4 throughout .  SENSORY:  Intact to vibration throughout. Rhomberg testing negative.  COORDINATION/GAIT:  Normal finger-to- nose-finger.  Gait narrow based and stable. Tandem gait intact.  Data: NCS/EMG of the legs 07/20/2018:  This is a normal study of the lower extremities.  In particular, there is no evidence of a sensorimotor polyneuropathy or lumbosacral radiculopathy.    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 NCS/EMG is normal, although history highly suggestive of neuropathy.    - OTC lidocaine ointment  Muscle cramps, infrequent - Encouraged to stay well-hydrated and continue leg stretches  Return to clinic in 1 year  Thank you for allowing me to participate in patient's care.  If I can answer any additional questions, I would be pleased to do so.    Sincerely,    Pinchas Reither K. Posey Pronto, DO

## 2021-07-03 ENCOUNTER — Ambulatory Visit (INDEPENDENT_AMBULATORY_CARE_PROVIDER_SITE_OTHER): Payer: Medicare Other | Admitting: Neurology

## 2021-07-03 ENCOUNTER — Other Ambulatory Visit: Payer: Self-pay

## 2021-07-03 ENCOUNTER — Encounter: Payer: Self-pay | Admitting: Neurology

## 2021-07-03 VITALS — BP 119/61 | HR 74 | Ht 69.5 in | Wt 152.0 lb

## 2021-07-03 DIAGNOSIS — G629 Polyneuropathy, unspecified: Secondary | ICD-10-CM | POA: Diagnosis not present

## 2021-07-03 DIAGNOSIS — R252 Cramp and spasm: Secondary | ICD-10-CM | POA: Diagnosis not present

## 2021-07-03 NOTE — Patient Instructions (Signed)
You may try over-the-counter lidocaine ointment to your feet as needed  Return to clinic in 1 year

## 2021-07-08 ENCOUNTER — Encounter: Payer: Self-pay | Admitting: Obstetrics & Gynecology

## 2021-07-15 ENCOUNTER — Encounter: Payer: Self-pay | Admitting: Obstetrics & Gynecology

## 2021-07-16 ENCOUNTER — Other Ambulatory Visit: Payer: Self-pay

## 2021-07-16 ENCOUNTER — Ambulatory Visit (INDEPENDENT_AMBULATORY_CARE_PROVIDER_SITE_OTHER): Payer: Medicare Other | Admitting: Endocrinology

## 2021-07-16 VITALS — BP 120/74 | HR 61 | Ht 69.5 in | Wt 154.0 lb

## 2021-07-16 DIAGNOSIS — E0590 Thyrotoxicosis, unspecified without thyrotoxic crisis or storm: Secondary | ICD-10-CM | POA: Diagnosis not present

## 2021-07-16 LAB — TSH: TSH: 3.78 u[IU]/mL (ref 0.35–5.50)

## 2021-07-16 LAB — T4, FREE: Free T4: 0.83 ng/dL (ref 0.60–1.60)

## 2021-07-16 NOTE — Patient Instructions (Addendum)
Blood tests are requested for you today.  We'll let you know about the results.   If ever you have fever while taking methimazole, stop it and call us, even if the reason is obvious, because of the risk of a rare side-effect.   It is best to never miss the medication.  However, if you do miss it, next best is to double up the next time.   Please come back for a follow-up appointment in 3 months.

## 2021-07-16 NOTE — Progress Notes (Signed)
Subjective:    Patient ID: Crystal [name], female    DOB: 1950-11-24, 71 y.o.   MRN: [id number]  HPI Pt returns for f/u of hyperthyroidism (dx'ed 2021; she has never had thyroid imaging; tapazole was chosen as rx, due to AF).  pt states she feels well in general. She takes meds as rx'ed.  She has had just 1 episode of AF in the past few mos.   Past Medical History:  Diagnosis Date   BACK PAIN, LUMBAR    Breast cancer of lower-outer quadrant of right female breast ([place]) 10/24/2014   Breast cancer, right breast    FIBROADENOMA, BREAST 2006   Hyperthyroidism    New onset atrial fibrillation ([place]) 01/06/2020   Palpitations 01/06/2020   PARESTHESIA    toes   PONV (postoperative nausea and vomiting)    Radiation 11/23/14-12/25/14   right breast 42.72 gray, lumpectomy cavity boosted to 54.72 gray   Routine health maintenance 03/30/2013   Colonoscopy - for colonoscopy (Sept '14)  Immunization  Tdap Oct '14    Spider veins of both lower extremities 05/09/2019   Wears glasses     Past Surgical History:  Procedure Laterality Date   ATRIAL FIBRILLATION ABLATION N/A 05/25/2020   Procedure: ATRIAL FIBRILLATION ABLATION;  Surgeon: Constance Haw, MD;  Location: [facility];  Service: Cardiovascular;  Laterality: N/A;   atrophic vaginitis     BREAST LUMPECTOMY WITH RADIOACTIVE SEED LOCALIZATION Right 10/18/2014   Procedure: BREAST LUMPECTOMY WITH RADIOACTIVE SEED LOCALIZATION;  Surgeon: Coralie Keens, MD;  Location: [facility];  Service: General;  Laterality: Right;   COLONOSCOPY  09/05/2013   D & C and hysteroscopy  11/1998   for uterine polyp   DILATATION & CURRETTAGE/HYSTEROSCOPY WITH RESECTOCOPE N/A 05/30/2015   Procedure: [place];  Surgeon: Princess Bruins, MD;  Location: [facility];  Service: Gynecology;  Laterality: N/A;   DILATION AND CURETTAGE OF UTERUS     Sterotactix needle biopsy  2000   breast calcification     Social History   Socioeconomic History   Marital status: Married    Spouse name: howard   Number of children: 2   Years of education: 16   Highest education level: Not on file  Occupational History   Occupation: PROPERTY M[employer]    Employer: [employer]  Tobacco Use   Smoking status: Never   Smokeless tobacco: Never  Vaping Use   Vaping Use: Never used  Substance and Sexual Activity   Alcohol use: Yes    Comment: 1 a month   Drug use: No   Sexual activity: Yes    Partners: Male    Birth control/protection: Post-menopausal    Comment: 1st intercourse- 70, partners- less than 5  Other Topics Concern   Not on file  Social History Narrative   HSG, [place], Married '78,  1 son '81, 1 daughter '84, 1 grandson '12.    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 ACP - discussed with patient.    Refer - [email or web address]   Highest level of education:  BS   Right handed   Two story home   Social Determinants of Health   Financial Resource Strain: Not on file  Food Insecurity: Not on file  Transportation Needs: Not on file  Physical Activity: Not on file  Stress: Not on file  Social Connections: Not on file  Intimate Partner Violence: Not on file  Current Outpatient Medications on File Prior to Visit  Medication Sig Dispense Refill   acetaminophen (TYLENOL) 325 MG tablet Take 325 mg by mouth as needed for moderate pain or headache.     apixaban (ELIQUIS) 5 MG TABS tablet Take 1 tablet (5 mg total) by mouth 2 (two) times daily. 90 tablet 1   Calcium Carbonate-Vitamin D 600-400 MG-UNIT tablet Take 1 tablet by mouth daily.     cetirizine (ZYRTEC) 10 MG tablet Take 10 mg by mouth daily.     Cholecalciferol (VITAMIN D3) 1000 UNITS CAPS Take 1,000 Units by mouth daily.     flecainide (TAMBOCOR) 50 MG tablet Take 1 tablet (50 mg total) by mouth 2 (two) times daily. 90 tablet 3   methimazole (TAPAZOLE) 5 MG tablet Take 1  tablet (5 mg total) by mouth daily. 90 tablet 3   metoprolol succinate (TOPROL-XL) 50 MG 24 hr tablet Take 1 tablet (50 mg total) by mouth daily. Take with or immediately following a meal. 90 tablet 3   Multiple Vitamin (MULTIVITAMIN) tablet Take 1 tablet by mouth daily.     Polyethyl Glycol-Propyl Glycol (SYSTANE OP) Place 1 drop into both eyes daily as needed (For dryness.).      Probiotic Product (FORTIFY DAILY PROBIOTIC PO) Take 1 capsule by mouth daily.      No current facility-administered medications on file prior to visit.    No Known Allergies  Family History  Problem Relation Age of Onset   Hypothyroidism Mother    GER disease Mother    Osteoporosis Mother    Hypertension Mother    Cancer Mother        hodgkins lympoma, uterine cancer   Arrhythmia Mother    Hyperlipidemia Father    Hypertension Father    Diabetes Father    Coronary artery disease Father    Coronary artery disease Maternal Grandfather    COPD Neg Hx    Esophageal cancer Neg Hx    Rectal cancer Neg Hx    Stomach cancer Neg Hx     BP 120/74    Pulse 61    Ht 5' 9.5" (1.765 m)    Wt 154 lb (69.9 kg)    SpO2 97%    BMI 22.42 kg/m    Review of Systems Denies fever    Objective:   Physical Exam NECK: thyroid is slightly enlarged, with irreg surface.    Lab Results  Component Value Date   TSH 3.78 07/16/2021      Assessment & Plan:  Hyperthyroidism: well-controlled.  Please continue the same metimazole

## 2021-09-05 ENCOUNTER — Other Ambulatory Visit: Payer: Self-pay

## 2021-09-05 ENCOUNTER — Encounter: Payer: Self-pay | Admitting: Cardiology

## 2021-09-05 ENCOUNTER — Ambulatory Visit (INDEPENDENT_AMBULATORY_CARE_PROVIDER_SITE_OTHER): Payer: Medicare Other | Admitting: Cardiology

## 2021-09-05 VITALS — BP 102/70 | HR 65 | Ht 69.0 in | Wt 153.2 lb

## 2021-09-05 DIAGNOSIS — D6869 Other thrombophilia: Secondary | ICD-10-CM | POA: Diagnosis not present

## 2021-09-05 DIAGNOSIS — Z79899 Other long term (current) drug therapy: Secondary | ICD-10-CM

## 2021-09-05 DIAGNOSIS — I480 Paroxysmal atrial fibrillation: Secondary | ICD-10-CM

## 2021-09-05 NOTE — Progress Notes (Signed)
Electrophysiology Office Note   Date:  09/05/2021   ID:  [name], DOB [birth year], MRN [id number]  PCP:  Hoyt Koch, MD  Cardiologist: Burt Knack Primary Electrophysiologist:  Karter Hellmer Meredith Leeds, MD    Chief Complaint: Atrial fibrillation   History of Present Illness: Crystal [name] is a 71 y.o. female who is being seen today for the evaluation of atrial fibrillation at the request of Hoyt Koch, *. Presenting today for electrophysiology evaluation.  She has a history significant of for right-sided breast cancer and paroxysmal atrial fibrillation.  She was diagnosed with atrial fibrillation 01/18/2020.  She was started on metoprolol and Eliquis.  ZIO monitor showed a 20% burden.  She is status post ablation 05/25/2020.  She had continued episodes of atrial fibrillation though less often.  She is now on flecainide.  Today, denies symptoms of palpitations, chest pain, shortness of breath, orthopnea, PND, lower extremity edema, claudication, dizziness, presyncope, syncope, bleeding, or neurologic sequela. The patient is tolerating medications without difficulties.  Since being seen she has done well.  She has no chest pain or shortness of breath.  She has not had any further episodes of atrial fibrillation.  She is overall happy with her care.  She does not wish to change off of flecainide at this time.  Past Medical History:  Diagnosis Date   BACK PAIN, LUMBAR    Breast cancer of lower-outer quadrant of right female breast ([place]) 10/24/2014   Breast cancer, right breast    FIBROADENOMA, BREAST 2006   Hyperthyroidism    New onset atrial fibrillation ([place]) 01/06/2020   Palpitations 01/06/2020   PARESTHESIA    toes   PONV (postoperative nausea and vomiting)    Radiation 11/23/14-12/25/14   right breast 42.72 gray, lumpectomy cavity boosted to 54.72 gray   Routine health maintenance 03/30/2013   Colonoscopy - for colonoscopy (Sept '14)  Immunization  Tdap Oct  '14    Spider veins of both lower extremities 05/09/2019   Wears glasses    Past Surgical History:  Procedure Laterality Date   ATRIAL FIBRILLATION ABLATION N/A 05/25/2020   Procedure: ATRIAL FIBRILLATION ABLATION;  Surgeon: Constance Haw, MD;  Location: [facility];  Service: Cardiovascular;  Laterality: N/A;   atrophic vaginitis     BREAST LUMPECTOMY WITH RADIOACTIVE SEED LOCALIZATION Right 10/18/2014   Procedure: BREAST LUMPECTOMY WITH RADIOACTIVE SEED LOCALIZATION;  Surgeon: Coralie Keens, MD;  Location: [facility];  Service: General;  Laterality: Right;   COLONOSCOPY  09/05/2013   D & C and hysteroscopy  11/1998   for uterine polyp   DILATATION & CURRETTAGE/HYSTEROSCOPY WITH RESECTOCOPE N/A 05/30/2015   Procedure: [place];  Surgeon: Princess Bruins, MD;  Location: [facility];  Service: Gynecology;  Laterality: N/A;   DILATION AND CURETTAGE OF UTERUS     Sterotactix needle biopsy  2000   breast calcification     Current Outpatient Medications  Medication Sig Dispense Refill   acetaminophen (TYLENOL) 325 MG tablet Take 325 mg by mouth as needed for moderate pain or headache.     apixaban (ELIQUIS) 5 MG TABS tablet Take 1 tablet (5 mg total) by mouth 2 (two) times daily. 90 tablet 1   Calcium Carbonate-Vitamin D 600-400 MG-UNIT tablet Take 1 tablet by mouth daily.     cetirizine (ZYRTEC) 10 MG tablet Take 10 mg by mouth daily.     Cholecalciferol (VITAMIN D3) 1000 UNITS CAPS Take 1,000 Units by mouth daily.  flecainide (TAMBOCOR) 50 MG tablet Take 1 tablet (50 mg total) by mouth 2 (two) times daily. 90 tablet 3   methimazole (TAPAZOLE) 5 MG tablet Take 1 tablet (5 mg total) by mouth daily. 90 tablet 3   metoprolol succinate (TOPROL-XL) 50 MG 24 hr tablet Take 1 tablet (50 mg total) by mouth daily. Take with or immediately following a meal. 90 tablet 3   Multiple Vitamin (MULTIVITAMIN) tablet Take 1 tablet by  mouth daily.     Polyethyl Glycol-Propyl Glycol (SYSTANE OP) Place 1 drop into both eyes daily as needed (For dryness.).      Probiotic Product (FORTIFY DAILY PROBIOTIC PO) Take 1 capsule by mouth daily.      No current facility-administered medications for this visit.    Allergies:   Patient has no known allergies.   Social History:  The patient  reports that she has never smoked. She has never used smokeless tobacco. She reports current alcohol use. She reports that she does not use drugs.   Family History:  The patient's family history includes Arrhythmia in her mother; Cancer in her mother; Coronary artery disease in her father and maternal grandfather; Diabetes in her father; GER disease in her mother; Hyperlipidemia in her father; Hypertension in her father and mother; Hypothyroidism in her mother; Osteoporosis in her mother.   ROS:  Please see the history of present illness.   Otherwise, review of systems is positive for none.   All other systems are reviewed and negative.   PHYSICAL EXAM: VS:  BP 102/70    Pulse 65    Ht '5\' 9"'$  (1.753 m)    Wt 153 lb 3.2 oz (69.5 kg)    SpO2 98%    BMI 22.62 kg/m  , BMI Body mass index is 22.62 kg/m. GEN: Well nourished, well developed, in no acute distress  HEENT: normal  Neck: no JVD, carotid bruits, or masses Cardiac: RRR; no murmurs, rubs, or gallops,no edema  Respiratory:  clear to auscultation bilaterally, normal work of breathing GI: soft, nontender, nondistended, + BS MS: no deformity or atrophy  Skin: warm and dry Neuro:  Strength and sensation are intact Psych: euthymic mood, full affect  EKG:  EKG is ordered today. Personal review of the ekg ordered shows sinus rhythm, rate 65  Recent Labs: 07/16/2021: TSH 3.78    Lipid Panel     Component Value Date/Time   CHOL 147 09/04/2020 0832   TRIG 138.0 09/04/2020 0832   HDL 57.30 09/04/2020 0832   CHOLHDL 3 09/04/2020 0832   VLDL 27.6 09/04/2020 0832   LDLCALC 62 09/04/2020 0832      Wt Readings from Last 3 Encounters:  09/05/21 153 lb 3.2 oz (69.5 kg)  07/16/21 154 lb (69.9 kg)  07/03/21 152 lb (68.9 kg)      Other studies Reviewed: Additional studies/ records that were reviewed today include: TTE 01/31/20  Review of the above records today demonstrates:   1. Left ventricular ejection fraction, by estimation, is 60 to 65%. The  left ventricle has normal function. The left ventricle has no regional  wall motion abnormalities. Left ventricular diastolic parameters were  normal.   2. Right ventricular systolic function is normal. The right ventricular  size is normal. There is normal pulmonary artery systolic pressure.   3. The mitral valve is normal in structure. Trivial mitral valve  regurgitation. No evidence of mitral stenosis.   4. The aortic valve is tricuspid. Aortic valve regurgitation is trivial.  No  aortic stenosis is present.   5. The inferior vena cava is normal in size with greater than 50%  respiratory variability, suggesting right atrial pressure of 3 mmHg.   Monitor 02/21/20 personally reviewed The basic rhythm is normal sinus with an average HR of 71 bpm Atrial fibrillation with rapid ventricular rate occurs with a 5% burden No high-grade heart block or pathologic pauses No bradycardic events No significant ventricular ectopy    ASSESSMENT AND PLAN:  1.  Paroxysmal atrial fibrillation: CHA2DS2-VASc of 2.  Status post ablation 05/25/2020.  Currently on flecainide 50 mg twice daily.  High risk medication monitoring.  Also on Eliquis 5 mg twice daily, Toprol-XL 25 mg daily.  She remains in sinus rhythm and has had minimal episodes of atrial fibrillation.  She is overall happy with her control.  No changes.   Current medicines are reviewed at length with the patient today.   The patient does not have concerns regarding her medicines.  The following changes were made today: None  Labs/ tests ordered today include:  Orders Placed This  Encounter  Procedures   Basic metabolic panel   CBC   EKG 12-Lead      Disposition:   FU with Opal Dinning 6 months  Signed, Oneida Mckamey Meredith Leeds, MD  09/05/2021 2:35 PM     [address] [place] [id number] [phone] (office) [phone] (fax)

## 2021-09-05 NOTE — Patient Instructions (Addendum)
Medication Instructions:  ?Your physician recommends that you continue on your current medications as directed. Please refer to the Current Medication list given to you today. ? ?*If you need a refill on your cardiac medications before your next appointment, please call your pharmacy* ? ? ?Lab Work: ?Today: BMET & CBC ? ? ?Testing/Procedures: ?None ordered ? ? ?Follow-Up: ?At [facility], you and your health needs are our priority.  As part of our continuing mission to provide you with exceptional heart care, we have created designated Provider Care Teams.  These Care Teams include your primary Cardiologist (physician) and Advanced Practice Providers (APPs -  Physician Assistants and Nurse Practitioners) who all work together to provide you with the care you need, when you need it. ? ?Your next appointment:   ?6 month(s) ? ?The format for your next appointment:   ?In Person ? ?Provider:   ?Allegra Lai, MD ? ? ? ?Thank you for choosing [facility]!! ? ? ?Trinidad Curet, RN ?[phone] ? ? ?

## 2021-09-07 LAB — CBC
Hematocrit: 39.2 % (ref 34.0–46.6)
Hemoglobin: 13 g/dL (ref 11.1–15.9)
MCH: 30.8 pg (ref 26.6–33.0)
MCHC: 33.2 g/dL (ref 31.5–35.7)
MCV: 93 fL (ref 79–97)
Platelets: 232 10*3/uL (ref 150–450)
RBC: 4.22 x10E6/uL (ref 3.77–5.28)
RDW: 12 % (ref 11.7–15.4)
WBC: 5.4 10*3/uL (ref 3.4–10.8)

## 2021-09-07 LAB — BASIC METABOLIC PANEL
BUN/Creatinine Ratio: 19 (ref 12–28)
BUN: 21 mg/dL (ref 8–27)
CO2: 24 mmol/L (ref 20–29)
Calcium: 10.1 mg/dL (ref 8.7–10.3)
Chloride: 100 mmol/L (ref 96–106)
Creatinine, Ser: 1.1 mg/dL — ABNORMAL HIGH (ref 0.57–1.00)
Glucose: 77 mg/dL (ref 70–99)
Potassium: 4.8 mmol/L (ref 3.5–5.2)
Sodium: 142 mmol/L (ref 134–144)
eGFR: 54 mL/min/{1.73_m2} — ABNORMAL LOW (ref >59)

## 2021-09-09 ENCOUNTER — Encounter: Payer: Self-pay | Admitting: Internal Medicine

## 2021-09-09 ENCOUNTER — Telehealth: Payer: Self-pay | Admitting: Internal Medicine

## 2021-09-09 ENCOUNTER — Ambulatory Visit (INDEPENDENT_AMBULATORY_CARE_PROVIDER_SITE_OTHER): Payer: Medicare Other | Admitting: Internal Medicine

## 2021-09-09 ENCOUNTER — Other Ambulatory Visit: Payer: Self-pay

## 2021-09-09 VITALS — BP 100/70 | HR 64 | Temp 97.6°F

## 2021-09-09 DIAGNOSIS — N390 Urinary tract infection, site not specified: Secondary | ICD-10-CM | POA: Diagnosis not present

## 2021-09-09 DIAGNOSIS — R319 Hematuria, unspecified: Secondary | ICD-10-CM

## 2021-09-09 DIAGNOSIS — R350 Frequency of micturition: Secondary | ICD-10-CM

## 2021-09-09 DIAGNOSIS — R82998 Other abnormal findings in urine: Secondary | ICD-10-CM | POA: Diagnosis not present

## 2021-09-09 LAB — POCT URINALYSIS DIPSTICK
Bilirubin, UA: NEGATIVE
Glucose, UA: NEGATIVE
Ketones, UA: NEGATIVE
Nitrite, UA: NEGATIVE
Protein, UA: NEGATIVE
Spec Grav, UA: <=1.005 — AB (ref 1.010–1.025)
Urobilinogen, UA: 0.2 E.U./dL
pH, UA: 6 (ref 5.0–8.0)

## 2021-09-09 MED ORDER — CIPROFLOXACIN HCL 500 MG PO TABS: 500.0000 mg | ORAL_TABLET | Freq: Two times a day (BID) | ORAL | 0 refills | Status: DC

## 2021-09-09 NOTE — Telephone Encounter (Signed)
Crystal [name] ?[phone] ? ?Aryn stop by and she is having frequency in urination. Only going a little at a time. Starting to hurt. I made her an appointment. ? ? ?

## 2021-09-09 NOTE — Patient Instructions (Addendum)
Clean-catch urine specimen is abnormal today and has been sent for culture.  Please start Levaquin 500 mg twice daily for 7 days pending culture results.  Call if not improving in 24 to 48 hours or sooner if worse ?

## 2021-09-09 NOTE — Progress Notes (Signed)
? ?  Subjective:  ? ? Patient ID: Crystal [name], female    DOB: [birth year], 71 y.o.   MRN: [id number] ? ?HPI  71 year old Female with history of infrequent UTIs.  Now has urinary frequency and small volumes of urine.  Onset of symptoms Friday, March 10.  No frank hematuria.  No fever or shaking chills.  No nausea or vomiting. ? ?Not seen since  December 2019 when had Citrobacter koseri UTI sensitive to Cipro and  treated with Cipro. ? ?History of other UTIs outlined below: ? ?Had E. coli UTI January 2017 ? ?Had Citrobacter frendii UTI in November 2017 ? ?Had E. coli UTI September 2018 resistant to Augmentin ? ?In March 2019 had Citrobacter koseri UTI ? ?In April 2019 had E. coli UTI with 10,000-50,000 colony-forming units ? ?In October 2019 had E. coli UTI greater than 100,000 colony-forming units ? ? ? ? ? ?Also has history of paroxysmal atrial fibrillation followed by Dr. Curt Bears status post A-fib ablation November 2021, hyperthyroidism followed by Dr. Loanne Drilling treated with Tapazole.  Is on chronic anticoagulation consisting of Eliquis 5 mg twice daily.  Also takes metoprolol XL 50 mg daily and Tambocor 50 mg twice daily.  History of osteopenia.  Had bone density study January 2023.  History of right breast cancer status post lumpectomy 2016.  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  Was treated with lumpectomy, radiation and antiestrogen therapy.  Dr. Dellis Filbert is GYN physician.  History of stable pulmonary nodule right lower lobe measuring approximately 6 mm and a peripheral pulmonary nodule in left lung base measuring approximately 4 mm as well as a small 3 mm pulmonary nodule left upper lobe based on CT scanning March 2022. ? ?History of DandC/hysteroscopy with a resectoscope by Dr. Dellis Filbert in 2016. ? ? ? ?Review of Systems was taking probiotics until January of this year but resumed this past weekend.Has urgency, frequency, and decreased amounts voiding at a time. ? ?   ?Objective:  ? Physical Exam ?Temperature 97.6  degrees by tympanic thermometer, pulse oximetry 99% pulse 64, Blood pressure 100/70 ? ?No acute distress at the present time.  No CVA tenderness.  Urine specimen is abnormal with moderate occult blood and moderate LE.  And culture was sent. ?   ? ? ?   ?Assessment & Plan:  ?Acute UTI ? ?Plan: Cipro 500 mg twice daily for 7 days.  Culture is pending.  Call if not improving in 24 to 48 hours or sooner if worse. ? ?

## 2021-09-12 LAB — URINE CULTURE
MICRO NUMBER:: 13122159
SPECIMEN QUALITY:: ADEQUATE

## 2021-09-19 ENCOUNTER — Encounter: Payer: Self-pay | Admitting: Internal Medicine

## 2021-09-19 ENCOUNTER — Ambulatory Visit (INDEPENDENT_AMBULATORY_CARE_PROVIDER_SITE_OTHER): Payer: Medicare Other | Admitting: Internal Medicine

## 2021-09-19 ENCOUNTER — Other Ambulatory Visit: Payer: Self-pay

## 2021-09-19 VITALS — BP 120/82 | HR 65 | Temp 97.6°F | Resp 97

## 2021-09-19 DIAGNOSIS — R319 Hematuria, unspecified: Secondary | ICD-10-CM

## 2021-09-19 DIAGNOSIS — N390 Urinary tract infection, site not specified: Secondary | ICD-10-CM | POA: Diagnosis not present

## 2021-09-19 DIAGNOSIS — R82998 Other abnormal findings in urine: Secondary | ICD-10-CM

## 2021-09-19 LAB — POCT URINALYSIS DIPSTICK
Bilirubin, UA: NEGATIVE
Glucose, UA: NEGATIVE
Ketones, UA: NEGATIVE
Nitrite, UA: NEGATIVE
Protein, UA: NEGATIVE
Spec Grav, UA: <=1.005 — AB (ref 1.010–1.025)
Urobilinogen, UA: 0.2 E.U./dL
pH, UA: 5 (ref 5.0–8.0)

## 2021-09-19 NOTE — Patient Instructions (Signed)
We have recultured your urine today due to large occult blood showing up in urine specimen but it is not visible on inspection with the naked eye.  Trace LE is seen on dipstick but there is no dysuria.  We will notify you of results. ?

## 2021-09-19 NOTE — Progress Notes (Signed)
? ?  Subjective:  ? ? Patient ID: Crystal [name], female    DOB: 24-Jun-1951, 71 y.o.   MRN: [id number] ? ?HPI She was seen here March 13 with an acute urinary tract infection.  Symptoms included urinary frequency and small volumes of urine.  Onset of symptoms was Friday, March 10.  No frank hematuria.  No shaking chills, fever, nausea or vomiting.  Culture subsequently grew E. coli with excellent sensitivity to Cipro and she was treated with 500 mg twice daily for 7 days. ? ?Until September 09, 2021 she not seen here since in December 2019 at which time she had had Citrobacter koseri UTI sensitive to Cipro and was treated with Cipro. ? ?For other UTIs, please see detailed dictation 09/09/2021 outlining previous UTIs.  These have been Citrobacter or E. coli UTIs with a total of 6 UTIs between January 2017 and October 2019. ? ?Today, she is feeling better and is asymptomatic.  She is afebrile. ? ?Dipstick UA shows specific gravity less than 1.005.  Occult blood is positive and trace LE is present.  She has had no frank hematuria. ? ?Review of Systems see above ? ?   ?Objective:  ? Physical Exam ?Vital signs reviewed.  She looks well.  No CVA tenderness. ? ?Urine dipstick shows very dilute urine with specific gravity less than 1.005.  Large occult blood is showing up on urine dipstick but there is no frank hematuria in the urine specimen.  There are trace leukocytes and another culture was sent. ? ?   ?Assessment & Plan:  ? ?Feeling better after treatment with Cipro for E. coli UTI. ? ?History of infrequent UTIs ? ?Plan: Await urine culture results but she is asymptomatic at the present time. ?

## 2021-09-20 LAB — URINALYSIS, MICROSCOPIC ONLY
Bacteria, UA: NONE SEEN /HPF
Hyaline Cast: NONE SEEN /LPF
RBC / HPF: NONE SEEN /HPF (ref 0–2)
Squamous Epithelial / HPF: NONE SEEN /HPF
WBC, UA: NONE SEEN /HPF (ref 0–5)

## 2021-09-20 LAB — URINE CULTURE
MICRO NUMBER:: 13170475
Result:: NO GROWTH
SPECIMEN QUALITY:: ADEQUATE

## 2021-09-23 ENCOUNTER — Ambulatory Visit: Payer: Medicare Other

## 2021-09-23 ENCOUNTER — Ambulatory Visit: Payer: Medicare Other | Admitting: Internal Medicine

## 2021-09-24 ENCOUNTER — Encounter: Payer: Self-pay | Admitting: Internal Medicine

## 2021-09-24 ENCOUNTER — Ambulatory Visit (INDEPENDENT_AMBULATORY_CARE_PROVIDER_SITE_OTHER): Payer: Medicare Other

## 2021-09-24 ENCOUNTER — Ambulatory Visit (INDEPENDENT_AMBULATORY_CARE_PROVIDER_SITE_OTHER): Payer: Medicare Other | Admitting: Internal Medicine

## 2021-09-24 VITALS — BP 118/60 | HR 61 | Temp 98.0°F | Resp 18 | Ht 69.0 in | Wt 157.8 lb

## 2021-09-24 VITALS — BP 118/60 | HR 61 | Temp 98.0°F | Resp 16 | Ht 69.0 in | Wt 157.8 lb

## 2021-09-24 DIAGNOSIS — I480 Paroxysmal atrial fibrillation: Secondary | ICD-10-CM | POA: Diagnosis not present

## 2021-09-24 DIAGNOSIS — Z0000 Encounter for general adult medical examination without abnormal findings: Secondary | ICD-10-CM

## 2021-09-24 DIAGNOSIS — R911 Solitary pulmonary nodule: Secondary | ICD-10-CM | POA: Diagnosis not present

## 2021-09-24 DIAGNOSIS — R7301 Impaired fasting glucose: Secondary | ICD-10-CM

## 2021-09-24 LAB — COMPREHENSIVE METABOLIC PANEL
ALT: 40 U/L — ABNORMAL HIGH (ref 0–35)
AST: 34 U/L (ref 0–37)
Albumin: 4.3 g/dL (ref 3.5–5.2)
Alkaline Phosphatase: 68 U/L (ref 39–117)
BUN: 19 mg/dL (ref 6–23)
CO2: 32 mEq/L (ref 19–32)
Calcium: 10 mg/dL (ref 8.4–10.5)
Chloride: 101 mEq/L (ref 96–112)
Creatinine, Ser: 0.88 mg/dL (ref 0.40–1.20)
GFR: 66.79 mL/min (ref >60.00)
Glucose, Bld: 70 mg/dL (ref 70–99)
Potassium: 4.4 mEq/L (ref 3.5–5.1)
Sodium: 137 mEq/L (ref 135–145)
Total Bilirubin: 0.4 mg/dL (ref 0.2–1.2)
Total Protein: 7.1 g/dL (ref 6.0–8.3)

## 2021-09-24 LAB — HEMOGLOBIN A1C: Hgb A1c MFr Bld: 5.9 % (ref 4.6–6.5)

## 2021-09-24 LAB — LIPID PANEL
Cholesterol: 196 mg/dL (ref 0–200)
HDL: 86 mg/dL (ref >39.00)
LDL Cholesterol: 91 mg/dL (ref 0–99)
NonHDL: 109.58
Total CHOL/HDL Ratio: 2
Triglycerides: 92 mg/dL (ref 0.0–149.0)
VLDL: 18.4 mg/dL (ref 0.0–40.0)

## 2021-09-24 NOTE — Progress Notes (Signed)
? ?  Subjective:  ? ?Patient ID: Crystal [name], female    DOB: Nov 23, 1950, 71 y.o.   MRN: [id number] ? ?HPI ?The patient is a 71 YO female coming in for follow up.  ? ?Review of Systems  ?Constitutional: Negative.   ?HENT: Negative.    ?Eyes: Negative.   ?Respiratory:  Negative for cough, chest tightness and shortness of breath.   ?Cardiovascular:  Negative for chest pain, palpitations and leg swelling.  ?Gastrointestinal:  Negative for abdominal distention, abdominal pain, constipation, diarrhea, nausea and vomiting.  ?Musculoskeletal: Negative.   ?Skin: Negative.   ?Neurological: Negative.   ?Psychiatric/Behavioral: Negative.    ? ?Objective:  ?Physical Exam ?Constitutional:   ?   Appearance: She is well-developed.  ?HENT:  ?   Head: Normocephalic and atraumatic.  ?Cardiovascular:  ?   Rate and Rhythm: Normal rate and regular rhythm.  ?Pulmonary:  ?   Effort: Pulmonary effort is normal. No respiratory distress.  ?   Breath sounds: Normal breath sounds. No wheezing or rales.  ?Abdominal:  ?   General: Bowel sounds are normal. There is no distension.  ?   Palpations: Abdomen is soft.  ?   Tenderness: There is no abdominal tenderness. There is no rebound.  ?Musculoskeletal:  ?   Cervical back: Normal range of motion.  ?Skin: ?   General: Skin is warm and dry.  ?Neurological:  ?   Mental Status: She is alert and oriented to person, place, and time.  ?   Coordination: Coordination normal.  ? ? ?Vitals:  ? 09/24/21 1335  ?BP: 118/60  ?Pulse: 61  ?Resp: 18  ?Temp: 98 ?F (36.7 ?C)  ?TempSrc: Oral  ?SpO2: 99%  ?Weight: 157 lb 12.8 oz (71.6 kg)  ?Height: '5\' 9"'$  (1.753 m)  ? ? ?This visit occurred during the SARS-CoV-2 public health emergency.  Safety protocols were in place, including screening questions prior to the visit, additional usage of staff PPE, and extensive cleaning of exam room while observing appropriate contact time as indicated for disinfecting solutions.  ? ?Assessment & Plan:  ? ?

## 2021-09-24 NOTE — Progress Notes (Signed)
? ?Subjective:  ? Crystal [name] is a 71 y.o. female who presents for an Initial Medicare Annual Wellness Visit. ? ?Review of Systems    ? ?Cardiac Risk Factors include: advanced age (>62mn, >>39women);family history of premature cardiovascular disease ? ?   ?Objective:  ?  ?Today's Vitals  ? 09/24/21 1300  ?BP: 118/60  ?Pulse: 61  ?Resp: 16  ?Temp: 98 ?F (36.7 ?C)  ?TempSrc: Temporal  ?SpO2: 99%  ?Weight: 157 lb 12.8 oz (71.6 kg)  ?Height: '5\' 9"'$  (1.753 m)  ?PainSc: 0-No pain  ? ?Body mass index is 23.3 kg/m?. ? ? ?  09/24/2021  ?  1:16 PM 07/03/2021  ?  8:51 AM 07/17/2020  ? 12:41 PM 05/25/2020  ?  9:04 AM 02/13/2020  ?  2:18 PM 07/20/2019  ?  8:36 AM 10/15/2018  ?  9:59 AM  ?Advanced Directives  ?Does Patient Have a Medical Advance Directive? Yes Yes Yes Yes Yes Yes Yes  ?Type of Advance Directive Living will;Healthcare Power of [place] will;Out of facility DNR (pink MOST or yellow form) [place]  [place]Living [place] will  ?Does patient want to make changes to medical advance directive? No - Patient declined     No - Patient declined   ?Copy of [place]? No - copy requested  No - copy requested    No - copy requested  ?Would patient like information on creating a medical advance directive? No - Patient declined        ? ? ?Current Medications (verified) ?Outpatient Encounter Medications as of 09/24/2021  ?Medication Sig  ? acetaminophen (TYLENOL) 325 MG tablet Take 325 mg by mouth as needed for moderate pain or headache.  ? apixaban (ELIQUIS) 5 MG TABS tablet Take 1 tablet (5 mg total) by mouth 2 (two) times daily.  ? Calcium Carbonate-Vitamin D 600-400 MG-UNIT tablet Take 1 tablet by mouth daily.  ? cetirizine (ZYRTEC) 10 MG tablet Take 10 mg by mouth daily.  ? Cholecalciferol (VITAMIN D3) 1000 UNITS CAPS Take 1,000 Units  by mouth daily.  ? flecainide (TAMBOCOR) 50 MG tablet Take 1 tablet (50 mg total) by mouth 2 (two) times daily.  ? methimazole (TAPAZOLE) 5 MG tablet Take 1 tablet (5 mg total) by mouth daily.  ? metoprolol succinate (TOPROL-XL) 50 MG 24 hr tablet Take 1 tablet (50 mg total) by mouth daily. Take with or immediately following a meal.  ? Multiple Vitamin (MULTIVITAMIN) tablet Take 1 tablet by mouth daily.  ? Polyethyl Glycol-Propyl Glycol (SYSTANE OP) Place 1 drop into both eyes daily as needed (For dryness.).   ? Probiotic Product (FORTIFY DAILY PROBIOTIC PO) Take 1 capsule by mouth daily.   ? [DISCONTINUED] ciprofloxacin (CIPRO) 500 MG tablet Take 1 tablet (500 mg total) by mouth 2 (two) times daily.  ? ?No facility-administered encounter medications on file as of 09/24/2021.  ? ? ?Allergies (verified) ?Patient has no known allergies.  ? ?History: ?Past Medical History:  ?Diagnosis Date  ? BACK PAIN, LUMBAR   ? Breast cancer of lower-outer quadrant of right female breast ([place] 10/24/2014  ? Breast cancer, right breast   ? FIBROADENOMA, BREAST 2006  ? Hyperthyroidism   ? [place] fibrillation [place] 01/06[place]  ? Palpitations 01/06/2020  ? PARESTHESIA   ? toes  ? PONV (postoperative nausea and vomiting)   ? Radiation 11/23/14-12/25/14  ?  right breast 42.72 gray, lumpectomy cavity boosted to 54.72 gray  ? Routine health maintenance 03/30/2013  ? Colonoscopy - for colonoscopy (Sept '14)  Immunization  Tdap Oct '14   ? Spider veins of both lower extremities 05/09/2019  ? Wears glasses   ? ?Past Surgical History:  ?Procedure Laterality Date  ? ATRIAL FIBRILLATION ABLATION N/A 05/25/2020  ? Procedure: ATRIAL FIBRILLATION ABLATION;  Surgeon: Constance Haw, MD;  Location: [facility];  Service: Cardiovascular;  Laterality: N/A;  ? atrophic vaginitis    ? BREAST LUMPECTOMY WITH RADIOACTIVE SEED LOCALIZATION Right 10/18/2014  ? Procedure: BREAST LUMPECTOMY WITH RADIOACTIVE SEED LOCALIZATION;  Surgeon: Coralie Keens, MD;  Location: [facility];  Service: General;  Laterality: Right;  ? COLONOSCOPY  09/05/2013  ? D & C and hysteroscopy  11/1998  ? for uterine polyp  ? DILATATION & CURRETTAGE/HYSTEROSCOPY WITH RESECTOCOPE N/A 05/30/2015  ? Procedure: [place];  Surgeon: Princess Bruins, MD;  Location: [facility];  Service: Gynecology;  Laterality: N/A;  ? DILATION AND CURETTAGE OF UTERUS    ? Sterotactix needle biopsy  2000  ? breast calcification  ? ?Family History  ?Problem Relation Age of Onset  ? Hypothyroidism Mother   ? GER disease Mother   ? Osteoporosis Mother   ? Hypertension Mother   ? Cancer Mother   ?     hodgkins lympoma, uterine cancer  ? Arrhythmia Mother   ? Hyperlipidemia Father   ? Hypertension Father   ? Diabetes Father   ? Coronary artery disease Father   ? Coronary artery disease Maternal Grandfather   ? COPD Neg Hx   ? Esophageal cancer Neg Hx   ? Rectal cancer Neg Hx   ? Stomach cancer Neg Hx   ? ?Social History  ? ?Socioeconomic History  ? Marital status: Married  ?  Spouse name: howard  ? Number of children: 2  ? Years of education: 42  ? Highest education level: Not on file  ?Occupational History  ? Occupation: PROPERTY MGR  ?  Employer: [employer]  ?Tobacco Use  ? Smoking status: Never  ? Smokeless tobacco: Never  ?Vaping Use  ? Vaping Use: Never used  ?Substance and Sexual Activity  ? Alcohol use: Yes  ?  Comment: 1 a month  ? Drug use: No  ? Sexual activity: Yes  ?  Partners: Male  ?  Birth control/protection: Post-menopausal  ?  Comment: 1st intercourse- 31, partners- less than 5  ?Other Topics Concern  ? Not on file  ?Social History Narrative  ? HSG, [place], Married '78,  1 son '81, 1 daughter '84, 1 grandson '12.   ? 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 ACP - discussed with patient.   ? Refer - [email or web address]  ? Highest level of education:  BS  ? Right handed  ? Two story  home  ? ?Social Determinants of Health  ? ?Financial Resource Strain: Low Risk   ? Difficulty of Paying Living Expenses: Not hard at all  ?Food Insecurity: No Food Insecurity  ? Worried About Charity fundraiser in the Last Year: Never true  ? Ran Out of Food in the Last Year: Never true  ?Transportation Needs: No Transportation Needs  ? Lack of Transportation (Medical): No  ? Lack of Transportation (Non-Medical): No  ?Physical Activity: Sufficiently Active  ? Days of Exercise per Week: 5 days  ?  Minutes of Exercise per Session: 30 min  ?Stress: No Stress Concern Present  ? Feeling of Stress : Not at all  ?Social Connections: Socially Integrated  ? Frequency of Communication with Friends and Family: More than three times a week  ? Frequency of Social Gatherings with Friends and Family: More than three times a week  ? Attends Religious Services: More than 4 times per year  ? Active Member of Clubs or Organizations: Yes  ? Attends Archivist Meetings: More than 4 times per year  ? Marital Status: Married  ? ? ?Tobacco Counseling ?Counseling given: Not Answered ? ? ?Clinical Intake: ? ?Pre-visit preparation completed: Yes ? ?Pain : No/denies pain ?Pain Score: 0-No pain ? ?  ? ?BMI - recorded: 23.3 ?Nutritional Status: BMI of 19-24  Normal ?Nutritional Risks: None ?Diabetes: No ? ?How often do you need to have someone help you when you read instructions, pamphlets, or other written materials from your doctor or pharmacy?: 1 - Never ?What is the last grade level you completed in school?: HSG; Bachelor's Degree; Business Owner ? ?Diabetic? no ? ?Interpreter Needed?: No ? ?Information entered by :: Lisette Abu, LPN ? ? ?Activities of Daily Living ? ?  09/24/2021  ?  1:38 PM  ?In your present state of health, do you have any difficulty performing the following activities:  ?Hearing? 0  ?Vision? 0  ?Difficulty concentrating or making decisions? 0  ?Walking or climbing stairs? 0  ?Dressing or bathing? 0  ?Doing  errands, shopping? 0  ?Preparing Food and eating ? N  ?Using the Toilet? N  ?In the past six months, have you accidently leaked urine? N  ?Do you have problems with loss of bowel control? N  ?[place]

## 2021-09-24 NOTE — Patient Instructions (Addendum)
Crystal [name] , ?Thank you for taking time to come for your Medicare Wellness Visit. I appreciate your ongoing commitment to your health goals. Please review the following plan we discussed and let me know if I can assist you in the future.  ? ?Screening recommendations/referrals: ?Colonoscopy: 09/05/2013; due every 10 years ?Mammogram: 04/01/2021; due every 1-2 years ?Bone Density: 07/15/2021; due every 2 years ?Recommended yearly ophthalmology/optometry visit for glaucoma screening and checkup ?Recommended yearly dental visit for hygiene and checkup ? ?Vaccinations: ?Influenza vaccine: 02/28/2021; due every fall season ?Pneumococcal vaccine: 04/07/2018, 05/09/2019 ?Tdap vaccine: 03/30/2013; due every 10 years ?Shingles vaccine: never done   ?Covid-19: 08/05/2019, 09/07/2019, 05/16/2020, 05/21/2021 ? ?Advanced directives: Please bring a copy of your health care power of attorney and living will to the office at your convenience. ? ?Conditions/risks identified: Yes ? ?Next appointment: Please schedule your next Medicare Wellness Visit with your Nurse Health Advisor in 1 year or 366 days by calling [phone]. ? ? ?Preventive Care 40 Years and Older, Female ?Preventive care refers to lifestyle choices and visits with your health care provider that can promote health and wellness. ?What does preventive care include? ?A yearly physical exam. This is also called an annual well check. ?Dental exams once or twice a year. ?Routine eye exams. Ask your health care provider how often you should have your eyes checked. ?Personal lifestyle choices, including: ?Daily care of your teeth and gums. ?Regular physical activity. ?Eating a healthy diet. ?Avoiding tobacco and drug use. ?Limiting alcohol use. ?Practicing safe sex. ?Taking low-dose aspirin every day. ?Taking vitamin and mineral supplements as recommended by your health care provider. ?What happens during an annual well check? ?The services and screenings done by your health care  provider during your annual well check will depend on your age, overall health, lifestyle risk factors, and family history of disease. ?Counseling  ?Your health care provider may ask you questions about your: ?Alcohol use. ?Tobacco use. ?Drug use. ?Emotional well-being. ?Home and relationship well-being. ?Sexual activity. ?Eating habits. ?History of falls. ?Memory and ability to understand (cognition). ?Work and work Statistician. ?Reproductive health. ?Screening  ?You may have the following tests or measurements: ?Height, weight, and BMI. ?Blood pressure. ?Lipid and cholesterol levels. These may be checked every 5 years, or more frequently if you are over 53 years old. ?Skin check. ?Lung cancer screening. You may have this screening every year starting at age 63 if you have a 30-pack-year history of smoking and currently smoke or have quit within the past 15 years. ?Fecal occult blood test (FOBT) of the stool. You may have this test every year starting at age 84. ?Flexible sigmoidoscopy or colonoscopy. You may have a sigmoidoscopy every 5 years or a colonoscopy every 10 years starting at age 75. ?Hepatitis C blood test. ?Hepatitis B blood test. ?Sexually transmitted disease (STD) testing. ?Diabetes screening. This is done by checking your blood sugar (glucose) after you have not eaten for a while (fasting). You may have this done every 1-3 years. ?Bone density scan. This is done to screen for osteoporosis. You may have this done starting at age 55. ?Mammogram. This may be done every 1-2 years. Talk to your health care provider about how often you should have regular mammograms. ?Talk with your health care provider about your test results, treatment options, and if necessary, the need for more tests. ?Vaccines  ?Your health care provider may recommend certain vaccines, such as: ?Influenza vaccine. This is recommended every year. ?Tetanus, diphtheria, and acellular pertussis (Tdap,  Td) vaccine. You may need a Td  booster every 10 years. ?Zoster vaccine. You may need this after age 10. ?Pneumococcal 13-valent conjugate (PCV13) vaccine. One dose is recommended after age 39. ?Pneumococcal polysaccharide (PPSV23) vaccine. One dose is recommended after age 78. ?Talk to your health care provider about which screenings and vaccines you need and how often you need them. ?This information is not intended to replace advice given to you by your health care provider. Make sure you discuss any questions you have with your health care provider. ?Document Released: 07/13/2015 Document Revised: 03/05/2016 Document Reviewed: 04/17/2015 ?Elsevier Interactive Patient Education ? 2017 [place]. ? ?Fall Prevention in the Home ?Falls can cause injuries. They can happen to people of all ages. There are many things you can do to make your home safe and to help prevent falls. ?What can I do on the outside of my home? ?Regularly fix the edges of walkways and driveways and fix any cracks. ?Remove anything that might make you trip as you walk through a door, such as a raised step or threshold. ?Trim any bushes or trees on the path to your home. ?Use bright outdoor lighting. ?Clear any walking paths of anything that might make someone trip, such as rocks or tools. ?Regularly check to see if handrails are loose or broken. Make sure that both sides of any steps have handrails. ?Any raised decks and porches should have guardrails on the edges. ?Have any leaves, snow, or ice cleared regularly. ?Use sand or salt on walking paths during winter. ?Clean up any spills in your garage right away. This includes oil or grease spills. ?What can I do in the bathroom? ?Use night lights. ?Install grab bars by the toilet and in the tub and shower. Do not use towel bars as grab bars. ?Use non-skid mats or decals in the tub or shower. ?If you need to sit down in the shower, use a plastic, non-slip stool. ?Keep the floor dry. Clean up any water that spills on the floor  as soon as it happens. ?Remove soap buildup in the tub or shower regularly. ?Attach bath mats securely with double-sided non-slip rug tape. ?Do not have throw rugs and other things on the floor that can make you trip. ?What can I do in the bedroom? ?Use night lights. ?Make sure that you have a light by your bed that is easy to reach. ?Do not use any sheets or blankets that are too big for your bed. They should not hang down onto the floor. ?Have a firm chair that has side arms. You can use this for support while you get dressed. ?Do not have throw rugs and other things on the floor that can make you trip. ?What can I do in the kitchen? ?Clean up any spills right away. ?Avoid walking on wet floors. ?Keep items that you use a lot in easy-to-reach places. ?If you need to reach something above you, use a strong step stool that has a grab bar. ?Keep electrical cords out of the way. ?Do not use floor polish or wax that makes floors slippery. If you must use wax, use non-skid floor wax. ?Do not have throw rugs and other things on the floor that can make you trip. ?What can I do with my stairs? ?Do not leave any items on the stairs. ?Make sure that there are handrails on both sides of the stairs and use them. Fix handrails that are broken or loose. Make sure that handrails are as  long as the stairways. ?Check any carpeting to make sure that it is firmly attached to the stairs. Fix any carpet that is loose or worn. ?Avoid having throw rugs at the top or bottom of the stairs. If you do have throw rugs, attach them to the floor with carpet tape. ?Make sure that you have a light switch at the top of the stairs and the bottom of the stairs. If you do not have them, ask someone to add them for you. ?What else can I do to help prevent falls? ?Wear shoes that: ?Do not have high heels. ?Have rubber bottoms. ?Are comfortable and fit you well. ?Are closed at the toe. Do not wear sandals. ?If you use a stepladder: ?Make sure that it is  fully opened. Do not climb a closed stepladder. ?Make sure that both sides of the stepladder are locked into place. ?Ask someone to hold it for you, if possible. ?Clearly mark and make sure that you can see:

## 2021-09-27 ENCOUNTER — Other Ambulatory Visit: Payer: Self-pay | Admitting: Cardiology

## 2021-09-27 ENCOUNTER — Encounter: Payer: Self-pay | Admitting: Internal Medicine

## 2021-09-27 DIAGNOSIS — I480 Paroxysmal atrial fibrillation: Secondary | ICD-10-CM

## 2021-09-27 NOTE — Assessment & Plan Note (Signed)
Checking CMP to ensure dosing of medications is appropriate. Taking eliquis 5 mg BID and toprol-xl 50 mg daily. Adjust as needed.  ?

## 2021-09-27 NOTE — Assessment & Plan Note (Signed)
Checking CMP today and ordered CT scan for follow up. If stable does not need follow up imaging.  ?

## 2021-09-27 NOTE — Telephone Encounter (Signed)
Eliquis '5mg'$  refill request received. Patient is 70 years old, weight-71.6kg, Crea-0.88 on 09/24/2021, Diagnosis-Afib, and last seen by Dr. Curt Bears on 09/05/2021. Dose is appropriate based on dosing criteria. Will send in refill to requested pharmacy.   ?

## 2021-10-16 ENCOUNTER — Encounter: Payer: Self-pay | Admitting: Endocrinology

## 2021-10-16 ENCOUNTER — Ambulatory Visit (INDEPENDENT_AMBULATORY_CARE_PROVIDER_SITE_OTHER): Payer: Medicare Other | Admitting: Endocrinology

## 2021-10-16 VITALS — BP 108/64 | HR 72 | Ht 69.0 in | Wt 156.6 lb

## 2021-10-16 DIAGNOSIS — E0590 Thyrotoxicosis, unspecified without thyrotoxic crisis or storm: Secondary | ICD-10-CM | POA: Diagnosis not present

## 2021-10-16 LAB — TSH: TSH: 2.2 u[IU]/mL (ref 0.35–5.50)

## 2021-10-16 LAB — T4, FREE: Free T4: 0.79 ng/dL (ref 0.60–1.60)

## 2021-10-16 NOTE — Progress Notes (Signed)
? ? ?Subjective:  ? ? Patient ID: Crystal [name], female    DOB: May 02, 1951, 71 y.o.   MRN: [id number] ? ?This visit occurred during the SARS-CoV-2 public health emergency.  Safety protocols were in place, including screening questions prior to the visit, additional usage of staff PPE, and extensive cleaning of exam room while observing appropriate contact time as indicated for disinfecting solutions. ? ? ? ?HPI ?Crystal [name] is here for  ?Chief Complaint  ?Patient presents with  ? Sinus Problem  ?  Nasal drainage, cough  ? ? ?She is here for an acute visit for cold symptoms.  ? ?Her symptoms started over one week ago. ? ?She is experiencing PND, sinus pressure, sore throat, eye drainage, cough that is productive at times, headaches.  Low grade fever today.   The mucus is thick and discolored.  Symptoms are getting worse. ? ?She has tried taking sudafed, sinus medication ? ? ? ? ? ?Medications and allergies reviewed with patient and updated if appropriate. ? ?Current Outpatient Medications on File Prior to Visit  ?Medication Sig Dispense Refill  ? acetaminophen (TYLENOL) 325 MG tablet Take 325 mg by mouth as needed for moderate pain or headache.    ? apixaban (ELIQUIS) 5 MG TABS tablet Take 1 tablet (5 mg total) by mouth 2 (two) times daily. 180 tablet 1  ? Calcium Carbonate-Vitamin D 600-400 MG-UNIT tablet Take 1 tablet by mouth daily.    ? cetirizine (ZYRTEC) 10 MG tablet Take 10 mg by mouth daily.    ? Cholecalciferol (VITAMIN D3) 1000 UNITS CAPS Take 1,000 Units by mouth daily.    ? flecainide (TAMBOCOR) 50 MG tablet Take 1 tablet (50 mg total) by mouth 2 (two) times daily. 90 tablet 3  ? methimazole (TAPAZOLE) 5 MG tablet Take 1 tablet (5 mg total) by mouth daily. 90 tablet 3  ? metoprolol succinate (TOPROL-XL) 50 MG 24 hr tablet Take 1 tablet (50 mg total) by mouth daily. Take with or immediately following a meal. 90 tablet 3  ? Multiple Vitamin (MULTIVITAMIN) tablet Take 1 tablet by mouth daily.    ?  neomycin-polymyxin b-dexamethasone (MAXITROL) 3.5-10000-0.1 SUSP SMARTSIG:In Eye(s)    ? Polyethyl Glycol-Propyl Glycol (SYSTANE OP) Place 1 drop into both eyes daily as needed (For dryness.).     ? Probiotic Product (FORTIFY DAILY PROBIOTIC PO) Take 1 capsule by mouth daily.     ? ?No current facility-administered medications on file prior to visit.  ? ? ?Review of Systems  ?Constitutional:  Positive for fever (low grade). Negative for chills.  ?HENT:  Positive for postnasal drip, sinus pressure and sore throat. Negative for congestion and ear pain.   ?Eyes:  Positive for discharge.  ?Respiratory:  Positive for cough (productive at times from PND - mild blood in it). Negative for shortness of breath and wheezing.   ?Gastrointestinal:  Negative for diarrhea and nausea.  ?Neurological:  Positive for headaches. Negative for dizziness and light-headedness.  ? ?   ?Objective:  ? ?Vitals:  ? 10/17/21 0806  ?BP: 102/70  ?Pulse: 73  ?Temp: 99.2 ?F (37.3 ?C)  ?SpO2: 96%  ? ?BP Readings from Last 3 Encounters:  ?10/17/21 102/70  ?10/16/21 108/64  ?09/24/21 118/60  ? ?Wt Readings from Last 3 Encounters:  ?10/17/21 155 lb 9.6 oz (70.6 kg)  ?10/16/21 156 lb 9.6 oz (71 kg)  ?09/24/21 157 lb 12.8 oz (71.6 kg)  ? ?Body mass index is 22.98 kg/m?. ? ?  ?Physical Exam ?Constitutional:   ?  General: She is not in acute distress. ?   Appearance: Normal appearance. She is not ill-appearing.  ?HENT:  ?   Head: Normocephalic and atraumatic.  ?   Right Ear: Tympanic membrane, ear canal and external ear normal.  ?   Left Ear: Tympanic membrane, ear canal and external ear normal.  ?   Nose: Congestion present.  ?   Mouth/Throat:  ?   Mouth: Mucous membranes are moist.  ?   Pharynx: No oropharyngeal exudate or posterior oropharyngeal erythema.  ?Eyes:  ?   Conjunctiva/sclera: Conjunctivae normal.  ?Cardiovascular:  ?   Rate and Rhythm: Normal rate and regular rhythm.  ?Pulmonary:  ?   Effort: Pulmonary effort is normal. No respiratory  distress.  ?   Breath sounds: Normal breath sounds. No wheezing or rales.  ?Musculoskeletal:  ?   Cervical back: Neck supple. No tenderness.  ?Lymphadenopathy:  ?   Cervical: No cervical adenopathy.  ?Skin: ?   General: Skin is warm and dry.  ?Neurological:  ?   Mental Status: She is alert.  ? ?   ? ? ? ? ? ?Assessment & Plan:  ? ? ?See Problem List for Assessment and Plan of chronic medical problems.  ? ? ? ? ?

## 2021-10-16 NOTE — Patient Instructions (Addendum)
Blood tests are requested for you today.  We'll let you know about the results.   ?If ever you have fever while taking methimazole, stop it and call us, even if the reason is obvious, because of the risk of a rare side-effect.   ?It is best to never miss the medication.  However, if you do miss it, next best is to double up the next time.   ?You should have an endocrinology follow-up appointment in 3 months.   ?

## 2021-10-16 NOTE — Patient Instructions (Addendum)
? ? ? ? ?  Medications changes include :   augmentin twice daily x 10 days ? ? ?Your prescription(s) have been sent to your pharmacy.  ? ? ? ?Return if symptoms worsen or fail to improve. ? ?

## 2021-10-16 NOTE — Progress Notes (Signed)
? ?Subjective:  ? ? Patient ID: Crystal [name], female    DOB: 08-15-1950, 71 y.o.   MRN: [id number] ? ?HPI ?Pt returns for f/u of hyperthyroidism (dx'ed 2021; she has never had thyroid imaging; tapazole was chosen as rx, due to AF).  pt states she feels well in general, except for weight gain. She takes tapazole as rx'ed.   ?Past Medical History:  ?Diagnosis Date  ? BACK PAIN, LUMBAR   ? Breast cancer of lower-outer quadrant of right female breast ([place]) 10/24/2014  ? Breast cancer, right breast   ? FIBROADENOMA, BREAST 2006  ? Hyperthyroidism   ? New onset atrial fibrillation ([place]) 01/06/2020  ? Palpitations 01/06/2020  ? PARESTHESIA   ? toes  ? PONV (postoperative nausea and vomiting)   ? Radiation 11/23/14-12/25/14  ? right breast 42.72 gray, lumpectomy cavity boosted to 54.72 gray  ? Routine health maintenance 03/30/2013  ? Colonoscopy - for colonoscopy (Sept '14)  Immunization  Tdap Oct '14   ? Spider veins of both lower extremities 05/09/2019  ? Wears glasses   ? ? ?Past Surgical History:  ?Procedure Laterality Date  ? ATRIAL FIBRILLATION ABLATION N/A 05/25/2020  ? Procedure: ATRIAL FIBRILLATION ABLATION;  Surgeon: Constance Haw, MD;  Location: [facility];  Service: Cardiovascular;  Laterality: N/A;  ? atrophic vaginitis    ? BREAST LUMPECTOMY WITH RADIOACTIVE SEED LOCALIZATION Right 10/18/2014  ? Procedure: BREAST LUMPECTOMY WITH RADIOACTIVE SEED LOCALIZATION;  Surgeon: Coralie Keens, MD;  Location: [facility];  Service: General;  Laterality: Right;  ? COLONOSCOPY  09/05/2013  ? D & C and hysteroscopy  11/1998  ? for uterine polyp  ? DILATATION & CURRETTAGE/HYSTEROSCOPY WITH RESECTOCOPE N/A 05/30/2015  ? Procedure: [place];  Surgeon: Princess Bruins, MD;  Location: [facility];  Service: Gynecology;  Laterality: N/A;  ? DILATION AND CURETTAGE OF UTERUS    ? Sterotactix needle biopsy  2000  ? breast calcification  ? ? ?Social History   ? ?Socioeconomic History  ? Marital status: Married  ?  Spouse name: howard  ? Number of children: 2  ? Years of education: 81  ? Highest education level: Not on file  ?Occupational History  ? Occupation: PROPERTY MGR  ?  Employer: [employer]  ?Tobacco Use  ? Smoking status: Never  ? Smokeless tobacco: Never  ?Vaping Use  ? Vaping Use: Never used  ?Substance and Sexual Activity  ? Alcohol use: Yes  ?  Comment: 1 a month  ? Drug use: No  ? Sexual activity: Yes  ?  Partners: Male  ?  Birth control/protection: Post-menopausal  ?  Comment: 1st intercourse- 46, partners- less than 5  ?Other Topics Concern  ? Not on file  ?Social History Narrative  ? HSG, [place], Married '78,  1 son '81, 1 daughter '84, 1 grandson '12.   ? 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 ACP - discussed with patient.   ? Refer - [email or web address]  ? Highest level of education:  BS  ? Right handed  ? Two story home  ? ?Social Determinants of Health  ? ?Financial Resource Strain: Low Risk   ? Difficulty of Paying Living Expenses: Not hard at all  ?Food Insecurity: No Food Insecurity  ? Worried About Charity fundraiser in the Last Year: Never true  ? Ran Out of Food in the Last Year: Never true  ?Transportation Needs: No  Transportation Needs  ? Lack of Transportation (Medical): No  ? Lack of Transportation (Non-Medical): No  ?Physical Activity: Sufficiently Active  ? Days of Exercise per Week: 5 days  ? Minutes of Exercise per Session: 30 min  ?Stress: No Stress Concern Present  ? Feeling of Stress : Not at all  ?Social Connections: Socially Integrated  ? Frequency of Communication with Friends and Family: More than three times a week  ? Frequency of Social Gatherings with Friends and Family: More than three times a week  ? Attends Religious Services: More than 4 times per year  ? Active Member of Clubs or Organizations: Yes  ? Attends Archivist Meetings: More than 4 times per  year  ? Marital Status: Married  ?Intimate Partner Violence: Not At Risk  ? Fear of Current or Ex-Partner: No  ? Emotionally Abused: No  ? Physically Abused: No  ? Sexually Abused: No  ? ? ?Current Outpatient Medications on File Prior to Visit  ?Medication Sig Dispense Refill  ? acetaminophen (TYLENOL) 325 MG tablet Take 325 mg by mouth as needed for moderate pain or headache.    ? apixaban (ELIQUIS) 5 MG TABS tablet Take 1 tablet (5 mg total) by mouth 2 (two) times daily. 180 tablet 1  ? Calcium Carbonate-Vitamin D 600-400 MG-UNIT tablet Take 1 tablet by mouth daily.    ? cetirizine (ZYRTEC) 10 MG tablet Take 10 mg by mouth daily.    ? Cholecalciferol (VITAMIN D3) 1000 UNITS CAPS Take 1,000 Units by mouth daily.    ? flecainide (TAMBOCOR) 50 MG tablet Take 1 tablet (50 mg total) by mouth 2 (two) times daily. 90 tablet 3  ? methimazole (TAPAZOLE) 5 MG tablet Take 1 tablet (5 mg total) by mouth daily. 90 tablet 3  ? metoprolol succinate (TOPROL-XL) 50 MG 24 hr tablet Take 1 tablet (50 mg total) by mouth daily. Take with or immediately following a meal. 90 tablet 3  ? Multiple Vitamin (MULTIVITAMIN) tablet Take 1 tablet by mouth daily.    ? Polyethyl Glycol-Propyl Glycol (SYSTANE OP) Place 1 drop into both eyes daily as needed (For dryness.).     ? Probiotic Product (FORTIFY DAILY PROBIOTIC PO) Take 1 capsule by mouth daily.     ? ?No current facility-administered medications on file prior to visit.  ? ? ?No Known Allergies ? ?Family History  ?Problem Relation Age of Onset  ? Hypothyroidism Mother   ? GER disease Mother   ? Osteoporosis Mother   ? Hypertension Mother   ? Cancer Mother   ?     hodgkins lympoma, uterine cancer  ? Arrhythmia Mother   ? Hyperlipidemia Father   ? Hypertension Father   ? Diabetes Father   ? Coronary artery disease Father   ? Coronary artery disease Maternal Grandfather   ? COPD Neg Hx   ? Esophageal cancer Neg Hx   ? Rectal cancer Neg Hx   ? Stomach cancer Neg Hx   ? ? ?BP 108/64 (BP  Location: Left Arm, Patient Position: Sitting, Cuff Size: Normal)   Pulse 72   Ht '5\' 9"'$  (1.753 m)   Wt 156 lb 9.6 oz (71 kg)   SpO2 96%   BMI 23.13 kg/m?  ? ? ?Review of Systems ?No fever.   ?   ?Objective:  ? Physical Exam ?VITAL SIGNS:  See vs page ?GENERAL: no distress ?NECK: There is no palpable thyroid enlargement.  No thyroid nodule is palpable.  No palpable  lymphadenopathy at the anterior neck.   ? ? ?Lab Results  ?Component Value Date  ? TSH 2.20 10/16/2021  ? ?   ?Assessment & Plan:  ?Hyperthyroidism: well-controlled.  Please continue the same methimazole.   ? ?

## 2021-10-17 ENCOUNTER — Encounter: Payer: Self-pay | Admitting: Internal Medicine

## 2021-10-17 ENCOUNTER — Ambulatory Visit (INDEPENDENT_AMBULATORY_CARE_PROVIDER_SITE_OTHER): Payer: Medicare Other | Admitting: Internal Medicine

## 2021-10-17 DIAGNOSIS — J0110 Acute frontal sinusitis, unspecified: Secondary | ICD-10-CM | POA: Diagnosis not present

## 2021-10-17 DIAGNOSIS — J019 Acute sinusitis, unspecified: Secondary | ICD-10-CM | POA: Insufficient documentation

## 2021-10-17 MED ORDER — AMOXICILLIN-POT CLAVULANATE 875-125 MG PO TABS: 1.0000 | ORAL_TABLET | Freq: Two times a day (BID) | ORAL | 0 refills | Status: DC

## 2021-10-17 NOTE — Assessment & Plan Note (Signed)
Acute Likely bacterial  Start Augmentin 875-125 mg BID x 10 day otc cold medications Rest, fluid Call if no improvement  

## 2021-10-21 ENCOUNTER — Ambulatory Visit
Admission: RE | Admit: 2021-10-21 | Discharge: 2021-10-21 | Disposition: A | Discharge status: Home / Self Care | Payer: Medicare Other | Source: Ambulatory Visit | Attending: Internal Medicine | Admitting: Internal Medicine

## 2021-10-21 DIAGNOSIS — R911 Solitary pulmonary nodule: Secondary | ICD-10-CM

## 2021-10-23 ENCOUNTER — Other Ambulatory Visit: Payer: Self-pay | Admitting: Internal Medicine

## 2021-10-29 ENCOUNTER — Telehealth: Payer: Self-pay | Admitting: Internal Medicine

## 2021-10-29 DIAGNOSIS — R9389 Abnormal findings on diagnostic imaging of other specified body structures: Secondary | ICD-10-CM

## 2021-10-29 NOTE — Telephone Encounter (Signed)
I have placed orders however per result note we need to wait 3-4 weeks AFTER antibiotics are finished so she should not do now.  ?

## 2021-10-29 NOTE — Telephone Encounter (Signed)
Patient states that she needs an order for a chest xray - she has finished her antibiotic - please advise. ?

## 2021-10-30 NOTE — Telephone Encounter (Signed)
Spoke with the patient and she has been scheduled for her lab appt for her chest xray on 11/18/2021 at 8:30 am. She is aware of appt date and time ?

## 2021-11-07 ENCOUNTER — Ambulatory Visit: Payer: Medicare Other | Admitting: Internal Medicine

## 2021-11-18 ENCOUNTER — Ambulatory Visit (INDEPENDENT_AMBULATORY_CARE_PROVIDER_SITE_OTHER): Payer: Medicare Other

## 2021-11-18 ENCOUNTER — Other Ambulatory Visit: Payer: Medicare Other

## 2021-11-18 DIAGNOSIS — R9389 Abnormal findings on diagnostic imaging of other specified body structures: Secondary | ICD-10-CM | POA: Diagnosis not present

## 2022-01-07 ENCOUNTER — Telehealth: Payer: Self-pay

## 2022-01-07 NOTE — Telephone Encounter (Signed)
Spoke with the patient in regards to scheduling a follow up visit with another provider here at the practice, says that she will give a call back to schedule once she figures out which provider she was recommended to.   Patient advised to call [phone] option #1 for scheduling.

## 2022-03-11 ENCOUNTER — Encounter: Payer: Self-pay | Admitting: Cardiology

## 2022-03-11 ENCOUNTER — Ambulatory Visit: Payer: Medicare Other | Attending: Cardiology | Admitting: Cardiology

## 2022-03-11 ENCOUNTER — Other Ambulatory Visit: Payer: Self-pay

## 2022-03-11 VITALS — BP 112/60 | HR 65 | Ht 69.0 in | Wt 160.4 lb

## 2022-03-11 DIAGNOSIS — I480 Paroxysmal atrial fibrillation: Secondary | ICD-10-CM | POA: Diagnosis present

## 2022-03-11 DIAGNOSIS — D6869 Other thrombophilia: Secondary | ICD-10-CM | POA: Diagnosis present

## 2022-03-11 DIAGNOSIS — E0590 Thyrotoxicosis, unspecified without thyrotoxic crisis or storm: Secondary | ICD-10-CM

## 2022-03-11 MED ORDER — METHIMAZOLE 5 MG PO TABS: 5.0000 mg | ORAL_TABLET | Freq: Every day | ORAL | 3 refills | Status: DC

## 2022-03-11 NOTE — Progress Notes (Signed)
Electrophysiology Office Note   Date:  03/11/2022   ID:  Crystal [name], DOB January 05, 1951, MRN [id number]  PCP:  Hoyt Koch, MD  Cardiologist: Burt Knack Primary Electrophysiologist:  Alayza Pieper Meredith Leeds, MD    Chief Complaint: Atrial fibrillation   History of Present Illness: Crystal [name] is a 71 y.o. female who is being seen today for the evaluation of atrial fibrillation at the request of Hoyt Koch, *. Presenting today for electrophysiology evaluation.  She has a history significant for right-sided breast cancer and paroxysmal atrial fibrillation.  She was diagnosed with atrial fibrillation 01/18/2020.  She was started on metoprolol and Eliquis.  Zio monitor showed a 20% burden.  She is status post ablation 05/25/2020.  She continued to have episodes of atrial fibrillation the less often.  She is now on flecainide.  Today, denies symptoms of palpitations, chest pain, shortness of breath, orthopnea, PND, lower extremity edema, claudication, dizziness, presyncope, syncope, bleeding, or neurologic sequela. The patient is tolerating medications without difficulties.  Since being seen she has done well.  She is tolerating the flecainide without issue.  She has noted no further episodes of atrial fibrillation.  She has been feeling well and is able to do all of her daily activities.   Past Medical History:  Diagnosis Date   BACK PAIN, LUMBAR    Breast cancer of lower-outer quadrant of right female breast ([place]) 10/24/2014   Breast cancer, right breast    FIBROADENOMA, BREAST 2006   Hyperthyroidism    New onset atrial fibrillation ([place]) 01/06/2020   Palpitations 01/06/2020   PARESTHESIA    toes   PONV (postoperative nausea and vomiting)    Radiation 11/23/14-12/25/14   right breast 42.72 gray, lumpectomy cavity boosted to 54.72 gray   Routine health maintenance 03/30/2013   Colonoscopy - for colonoscopy (Sept '14)  Immunization  Tdap Oct '14    Spider veins of  both lower extremities 05/09/2019   Wears glasses    Past Surgical History:  Procedure Laterality Date   ATRIAL FIBRILLATION ABLATION N/A 05/25/2020   Procedure: ATRIAL FIBRILLATION ABLATION;  Surgeon: Constance Haw, MD;  Location: [facility];  Service: Cardiovascular;  Laterality: N/A;   atrophic vaginitis     BREAST LUMPECTOMY WITH RADIOACTIVE SEED LOCALIZATION Right 10/18/2014   Procedure: BREAST LUMPECTOMY WITH RADIOACTIVE SEED LOCALIZATION;  Surgeon: Coralie Keens, MD;  Location: [facility];  Service: General;  Laterality: Right;   COLONOSCOPY  09/05/2013   D & C and hysteroscopy  11/1998   for uterine polyp   DILATATION & CURRETTAGE/HYSTEROSCOPY WITH RESECTOCOPE N/A 05/30/2015   Procedure: [place];  Surgeon: Princess Bruins, MD;  Location: [facility];  Service: Gynecology;  Laterality: N/A;   DILATION AND CURETTAGE OF UTERUS     Sterotactix needle biopsy  2000   breast calcification     Current Outpatient Medications  Medication Sig Dispense Refill   acetaminophen (TYLENOL) 325 MG tablet Take 325 mg by mouth as needed for moderate pain or headache.     apixaban (ELIQUIS) 5 MG TABS tablet Take 1 tablet (5 mg total) by mouth 2 (two) times daily. 180 tablet 1   Calcium Carbonate-Vitamin D 600-400 MG-UNIT tablet Take 1 tablet by mouth daily.     cetirizine (ZYRTEC) 10 MG tablet Take 10 mg by mouth daily.     Cholecalciferol (VITAMIN D3) 1000 UNITS CAPS Take 1,000 Units by mouth daily.     flecainide (TAMBOCOR) 50  MG tablet Take 1 tablet (50 mg total) by mouth 2 (two) times daily. 90 tablet 3   methimazole (TAPAZOLE) 5 MG tablet Take 1 tablet (5 mg total) by mouth daily. 90 tablet 3   metoprolol succinate (TOPROL-XL) 50 MG 24 hr tablet Take 1 tablet (50 mg total) by mouth daily. Take with or immediately following a meal. 90 tablet 3   Multiple Vitamin (MULTIVITAMIN) tablet Take 1 tablet by mouth daily.      neomycin-polymyxin b-dexamethasone (MAXITROL) 3.5-10000-0.1 SUSP SMARTSIG:In Eye(s)     Polyethyl Glycol-Propyl Glycol (SYSTANE OP) Place 1 drop into both eyes daily as needed (For dryness.).      Probiotic Product (FORTIFY DAILY PROBIOTIC PO) Take 1 capsule by mouth daily.      No current facility-administered medications for this visit.    Allergies:   Patient has no known allergies.   Social History:  The patient  reports that she has never smoked. She has never used smokeless tobacco. She reports current alcohol use. She reports that she does not use drugs.   Family History:  The patient's family history includes Arrhythmia in her mother; Cancer in her mother; Coronary artery disease in her father and maternal grandfather; Diabetes in her father; GER disease in her mother; Hyperlipidemia in her father; Hypertension in her father and mother; Hypothyroidism in her mother; Osteoporosis in her mother.   ROS:  Please see the history of present illness.   Otherwise, review of systems is positive for none.   All other systems are reviewed and negative.   PHYSICAL EXAM: VS:  BP 112/60   Pulse 65   Ht '5\' 9"'$  (1.753 m)   Wt 160 lb 6.4 oz (72.8 kg)   SpO2 96%   BMI 23.69 kg/m  , BMI Body mass index is 23.69 kg/m. GEN: Well nourished, well developed, in no acute distress  HEENT: normal   Neck: no JVD, carotid bruits, or masses Cardiac: RRR; no murmurs, rubs, or gallops,no edema  Respiratory:  clear to auscultation bilaterally, normal work of breathing GI: soft, nontender, nondistended, + BS MS: no deformity or atrophy  Skin: warm and dry Neuro:  Strength and sensation are intact Psych: euthymic mood, full affect  EKG:  EKG is ordered today. Personal review of the ekg ordered shows sinus rhythm, rate 65  Recent Labs: 09/05/2021: Hemoglobin 13.0; Platelets 232 09/24/2021: ALT 40; BUN 19; Creatinine, Ser 0.88; Potassium 4.4; Sodium 137 10/16/2021: TSH 2.20    Lipid Panel     Component  Value Date/Time   CHOL 196 09/24/2021 1434   TRIG 92.0 09/24/2021 1434   HDL 86.00 09/24/2021 1434   CHOLHDL 2 09/24/2021 1434   VLDL 18.4 09/24/2021 1434   LDLCALC 91 09/24/2021 1434     Wt Readings from Last 3 Encounters:  03/11/22 160 lb 6.4 oz (72.8 kg)  10/17/21 155 lb 9.6 oz (70.6 kg)  10/16/21 156 lb 9.6 oz (71 kg)      Other studies Reviewed: Additional studies/ records that were reviewed today include: TTE 01/31/20  Review of the above records today demonstrates:   1. Left ventricular ejection fraction, by estimation, is 60 to 65%. The  left ventricle has normal function. The left ventricle has no regional  wall motion abnormalities. Left ventricular diastolic parameters were  normal.   2. Right ventricular systolic function is normal. The right ventricular  size is normal. There is normal pulmonary artery systolic pressure.   3. The mitral valve is normal in structure.  Trivial mitral valve  regurgitation. No evidence of mitral stenosis.   4. The aortic valve is tricuspid. Aortic valve regurgitation is trivial.  No aortic stenosis is present.   5. The inferior vena cava is normal in size with greater than 50%  respiratory variability, suggesting right atrial pressure of 3 mmHg.   Monitor 02/21/20 personally reviewed The basic rhythm is normal sinus with an average HR of 71 bpm Atrial fibrillation with rapid ventricular rate occurs with a 5% burden No high-grade heart block or pathologic pauses No bradycardic events No significant ventricular ectopy    ASSESSMENT AND PLAN:  1.  Paroxysmal atrial fibrillation: Status post ablation 05/25/2020.  CHA2DS2-VASc of 2.  Currently on flecainide 50 mg twice daily, Toprol-XL 25 mg daily, Eliquis 5 mg twice daily.  High risk medication monitoring for flecainide.  Remained in sinus rhythm.  She has had no recurrence of her atrial fibrillation.  We Jorita Bohanon continue current management.  2.  Secondary hypercoagulable state: Currently  on Eliquis for atrial fibrillation as above  Current medicines are reviewed at length with the patient today.   The patient does not have concerns regarding her medicines.  The following changes were made today: None  Labs/ tests ordered today include:  Orders Placed This Encounter  Procedures   EKG 12-Lead      Disposition:   FU with Danyel Tobey 6 months  Signed, Jacksyn Beeks Meredith Leeds, MD  03/11/2022 2:06 PM     [address] [place] [address] [phone] (office) [phone] (fax)

## 2022-03-11 NOTE — Patient Instructions (Signed)
Medication Instructions:  Your physician recommends that you continue on your current medications as directed. Please refer to the Current Medication list given to you today.  *If you need a refill on your cardiac medications before your next appointment, please call your pharmacy*   Lab Work: None ordered   Testing/Procedures: None ordered   Follow-Up: At [facility], you and your health needs are our priority.  As part of our continuing mission to provide you with exceptional heart care, we have created designated Provider Care Teams.  These Care Teams include your primary Cardiologist (physician) and Advanced Practice Providers (APPs -  Physician Assistants and Nurse Practitioners) who all work together to provide you with the care you need, when you need it.  Your next appointment:   6 month(s)  The format for your next appointment:   In Person  Provider:   Will Camnitz, MD    Thank you for choosing [facility]!!   Amos Gaber, RN [phone]  Other Instructions   Important Information About Sugar           

## 2022-03-12 ENCOUNTER — Encounter: Payer: Self-pay | Admitting: Internal Medicine

## 2022-03-12 ENCOUNTER — Ambulatory Visit (INDEPENDENT_AMBULATORY_CARE_PROVIDER_SITE_OTHER): Payer: Medicare Other | Admitting: Internal Medicine

## 2022-03-12 VITALS — BP 124/68 | HR 64 | Ht 69.5 in | Wt 161.0 lb

## 2022-03-12 DIAGNOSIS — N644 Mastodynia: Secondary | ICD-10-CM | POA: Diagnosis not present

## 2022-03-12 DIAGNOSIS — Z23 Encounter for immunization: Secondary | ICD-10-CM | POA: Diagnosis not present

## 2022-03-12 NOTE — Patient Instructions (Signed)
We have ordered the breast mammogram and ultrasound.

## 2022-03-12 NOTE — Assessment & Plan Note (Signed)
She does have hx breast cancer right breast. New left breast pain normal exam. Ordered diagnostic mammogram and [name] left breast to be done.

## 2022-03-12 NOTE — Progress Notes (Signed)
   Subjective:   Patient ID: Crystal [name], female    DOB: 1951/02/29, 71 y.o.   MRN: [id number]  HPI The patient is a 71 YO female hx breast cancer right with new left breast pain.  Review of Systems  Constitutional: Negative.   HENT: Negative.    Eyes: Negative.   Respiratory:  Negative for cough, chest tightness and shortness of breath.   Cardiovascular:  Negative for chest pain, palpitations and leg swelling.  Gastrointestinal:  Negative for abdominal distention, abdominal pain, constipation, diarrhea, nausea and vomiting.  Genitourinary:        Breast pain  Musculoskeletal: Negative.   Skin: Negative.   Neurological: Negative.   Psychiatric/Behavioral: Negative.      Objective:  Physical Exam Constitutional:      Appearance: She is well-developed.  HENT:     Head: Normocephalic and atraumatic.  Cardiovascular:     Rate and Rhythm: Normal rate and regular rhythm.     Comments: Breast exam normal specifically no left breast mass or LAD Pulmonary:     Effort: Pulmonary effort is normal. No respiratory distress.     Breath sounds: Normal breath sounds. No wheezing or rales.  Abdominal:     General: Bowel sounds are normal. There is no distension.     Palpations: Abdomen is soft.     Tenderness: There is no abdominal tenderness. There is no rebound.  Musculoskeletal:     Cervical back: Normal range of motion.  Skin:    General: Skin is warm and dry.  Neurological:     Mental Status: She is alert and oriented to person, place, and time.     Coordination: Coordination normal.     Vitals:   03/12/22 0903  BP: 124/68  Pulse: 64  SpO2: 98%  Weight: 161 lb (73 kg)  Height: 5' 9.5" (1.765 m)    Assessment & Plan:  Flu shot given at visit

## 2022-03-19 ENCOUNTER — Other Ambulatory Visit: Payer: Self-pay | Admitting: Cardiology

## 2022-03-20 ENCOUNTER — Ambulatory Visit (INDEPENDENT_AMBULATORY_CARE_PROVIDER_SITE_OTHER): Payer: Medicare Other | Admitting: Internal Medicine

## 2022-03-20 ENCOUNTER — Encounter: Payer: Self-pay | Admitting: Internal Medicine

## 2022-03-20 VITALS — BP 128/78 | HR 78 | Ht 69.5 in | Wt 159.4 lb

## 2022-03-20 DIAGNOSIS — E0590 Thyrotoxicosis, unspecified without thyrotoxic crisis or storm: Secondary | ICD-10-CM | POA: Diagnosis not present

## 2022-03-20 NOTE — Patient Instructions (Addendum)
Please stop at the lab.  Please continue Methimazole 5 mg daily.  Please stop the Methimazole (Tapazole) and call us or your primary care doctor if you develop: - sore throat - fever - yellow skin - dark urine - light colored stools As we will then need to check your blood counts and liver tests.   Please return in 6 months.

## 2022-03-20 NOTE — Progress Notes (Signed)
Patient ID: JOSELLE DEEDS, female   DOB: 1951/06/17, 71 y.o.   MRN: [id number]  HPI  MERIDITH ROMICK is a 71 y.o.-year-old female, returning for follow-up for thyrotoxicosis.  She previously saw Dr. Loanne Drilling, last visit 5 months ago.  She is here with her husband who offers information about her past medical history and medications.  Patient describes that she was diagnosed with thyrotoxicosis in 2021 - after she presented to her PCP with due to unintentional weight loss (15 lbs in 1 year).  A TSH was checked at that time and it was low, while the free T4 was normal.  She was referred to Dr. Loanne Drilling.  At the time of the visit, a TSH was suppressed and free T4 was high.  She was started on methimazole.  She had to decrease the dose of methimazole last year.  She is currently on: - Methimazole (MMI) 5 mg daily for the last year - Toprol XL 50 mg daily.  Also on flecainide.  I reviewed pt's thyroid tests: Lab Results  Component Value Date   TSH 2.20 10/16/2021   TSH 3.78 07/16/2021   TSH 2.51 05/10/2021   TSH 16.04 (H) 03/08/2021   TSH 4.36 12/19/2020   TSH <0.01 Repeated and verified X2. (L) 09/04/2020   TSH 0.08 (L) 01/05/2020   TSH 1.02 05/09/2019   TSH 1.81 02/27/2016   TSH 1.64 03/30/2013   FREET4 0.79 10/16/2021   FREET4 0.83 07/16/2021   FREET4 0.85 05/10/2021   FREET4 0.53 (L) 03/08/2021   FREET4 0.46 (L) 12/19/2020   FREET4 1.79 (H) 09/06/2020   FREET4 1.4 01/05/2020   No results found for: "T3FREE"  Antithyroid antibodies: No results found for: "TSI"  Pt denies: - feeling nodules in neck - hoarseness - dysphagia - choking  She denies: - excessive sweating/heat intolerance - tremors - anxiety - palpitations - weight loss - she actually complains of weight gain  She mentions muscle fatigue when walking up the stairs and also weight gain.  Pt does have a FH of thyroid ds. : mother with hypothyroidism. No FH of thyroid cancer. No h/o radiation tx to head or  neck. No recent contrast studies. No steroid use. No herbal supplements. No Biotin use.  Pt. also has a history of breast cancer, s/p RxTx.    She also has paroxysmal atrial fibrillation in 2021 (before her diagnosis of Thyrotoxicosis) - had an ablation procedure; on metoprolol, flecainide, Eliquis.  ROS: Constitutional: + see HPI  Past Medical History:  Diagnosis Date   BACK PAIN, LUMBAR    Breast cancer of lower-outer quadrant of right female breast ([place]) 10/24/2014   Breast cancer, right breast    FIBROADENOMA, BREAST 2006   Hyperthyroidism    New onset atrial fibrillation ([place]) 01/06/2020   Palpitations 01/06/2020   PARESTHESIA    toes   PONV (postoperative nausea and vomiting)    Radiation 11/23/14-12/25/14   right breast 42.72 gray, lumpectomy cavity boosted to 54.72 gray   Routine health maintenance 03/30/2013   Colonoscopy - for colonoscopy (Sept '14)  Immunization  Tdap Oct '14    Spider veins of both lower extremities 05/09/2019   Wears glasses    Past Surgical History:  Procedure Laterality Date   ATRIAL FIBRILLATION ABLATION N/A 05/25/2020   Procedure: ATRIAL FIBRILLATION ABLATION;  Surgeon: Constance Haw, MD;  Location: [facility];  Service: Cardiovascular;  Laterality: N/A;   atrophic vaginitis     BREAST LUMPECTOMY WITH RADIOACTIVE SEED  LOCALIZATION Right 10/18/2014   Procedure: BREAST LUMPECTOMY WITH RADIOACTIVE SEED LOCALIZATION;  Surgeon: Coralie Keens, MD;  Location: [facility];  Service: General;  Laterality: Right;   COLONOSCOPY  09/05/2013   D & C and hysteroscopy  11/1998   for uterine polyp   DILATATION & CURRETTAGE/HYSTEROSCOPY WITH RESECTOCOPE N/A 05/30/2015   Procedure: [place];  Surgeon: Princess Bruins, MD;  Location: [facility];  Service: Gynecology;  Laterality: N/A;   DILATION AND CURETTAGE OF UTERUS     Sterotactix needle biopsy  2000   breast calcification   Social  History   Socioeconomic History   Marital status: Married    Spouse name: howard   Number of children: 2   Years of education: 16   Highest education level: Not on file  Occupational History   Occupation: PROPERTY MGR    Employer: [employer]  Tobacco Use   Smoking status: Never   Smokeless tobacco: Never  Vaping Use   Vaping Use: Never used  Substance and Sexual Activity   Alcohol use: Yes    Comment: 1 a month   Drug use: No   Sexual activity: Yes    Partners: Male    Birth control/protection: Post-menopausal    Comment: 1st intercourse- 60, partners- less than 5  Other Topics Concern   Not on file  Social History Narrative   HSG, [place], Married '78,  1 son '81, 1 daughter '84, 1 grandson '12.    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 ACP - discussed with patient.    Refer - [email or web address]   Highest level of education:  BS   Right handed   Two story home   Social Determinants of Health   Financial Resource Strain: Low Risk  (09/24/2021)   Overall Financial Resource Strain (CARDIA)    Difficulty of Paying Living Expenses: Not hard at all  Food Insecurity: No Food Insecurity (09/24/2021)   Hunger Vital Sign    Worried About Running Out of Food in the Last Year: Never true    Ran Out of Food in the Last Year: Never true  Transportation Needs: No Transportation Needs (09/24/2021)   PRAPARE - Hydrologist (Medical): No    Lack of Transportation (Non-Medical): No  Physical Activity: Sufficiently Active (09/24/2021)   Exercise Vital Sign    Days of Exercise per Week: 5 days    Minutes of Exercise per Session: 30 min  Stress: No Stress Concern Present (09/24/2021)   [place]    Feeling of Stress : Not at all  Social Connections: [place] (09/24/2021)   Social Connection and Isolation Panel [NHANES]     Frequency of Communication with Friends and Family: More than three times a week    Frequency of Social Gatherings with Friends and Family: More than three times a week    Attends Religious Services: More than 4 times per year    Active Member of [employer] or Organizations: Yes    Attends Archivist Meetings: More than 4 times per year    Marital Status: Married  Human resources officer Violence: Not At Risk (09/24/2021)   Humiliation, Afraid, Rape, and Kick questionnaire    Fear of Current or Ex-Partner: No    Emotionally Abused: No    Physically Abused: No    Sexually Abused: No   Current Outpatient Medications on  File Prior to Visit  Medication Sig Dispense Refill   acetaminophen (TYLENOL) 325 MG tablet Take 325 mg by mouth as needed for moderate pain or headache.     apixaban (ELIQUIS) 5 MG TABS tablet Take 1 tablet (5 mg total) by mouth 2 (two) times daily. 180 tablet 1   Calcium Carbonate-Vitamin D 600-400 MG-UNIT tablet Take 1 tablet by mouth daily.     cetirizine (ZYRTEC) 10 MG tablet Take 10 mg by mouth daily.     Cholecalciferol (VITAMIN D3) 1000 UNITS CAPS Take 1,000 Units by mouth daily.     flecainide (TAMBOCOR) 50 MG tablet TAKE ONE TABLET BY MOUTH TWICE DAILY 60 tablet 11   methimazole (TAPAZOLE) 5 MG tablet Take 1 tablet (5 mg total) by mouth daily. 90 tablet 3   metoprolol succinate (TOPROL-XL) 50 MG 24 hr tablet Take 1 tablet (50 mg total) by mouth daily. Take with or immediately following a meal. 90 tablet 3   Multiple Vitamin (MULTIVITAMIN) tablet Take 1 tablet by mouth daily.     neomycin-polymyxin b-dexamethasone (MAXITROL) 3.5-10000-0.1 SUSP SMARTSIG:In Eye(s)     Polyethyl Glycol-Propyl Glycol (SYSTANE OP) Place 1 drop into both eyes daily as needed (For dryness.).      Probiotic Product (FORTIFY DAILY PROBIOTIC PO) Take 1 capsule by mouth daily.      No current facility-administered medications on file prior to visit.   No Known Allergies Family History  Problem  Relation Age of Onset   Hypothyroidism Mother    GER disease Mother    Osteoporosis Mother    Hypertension Mother    Cancer Mother        hodgkins lympoma, uterine cancer   Arrhythmia Mother    Hyperlipidemia Father    Hypertension Father    Diabetes Father    Coronary artery disease Father    Coronary artery disease Maternal Grandfather    COPD Neg Hx    Esophageal cancer Neg Hx    Rectal cancer Neg Hx    Stomach cancer Neg Hx    PE: BP 128/78 (BP Location: Right Arm, Patient Position: Sitting, Cuff Size: Normal)   Pulse 78   Ht 5' 9.5" (1.765 m)   Wt 159 lb 6.4 oz (72.3 kg)   SpO2 98%   BMI 23.20 kg/m   Wt Readings from Last 3 Encounters:  03/20/22 159 lb 6.4 oz (72.3 kg)  03/12/22 161 lb (73 kg)  03/11/22 160 lb 6.4 oz (72.8 kg)   Constitutional: normal weight, in NAD Eyes: EOMI, no exophthalmos, no lid lag, no stare ENT: no thyromegaly, + L upper cervical lymph node palpated -not painful or indurated Cardiovascular: RRR, No MRG Respiratory: CTA B Musculoskeletal: no deformities Skin: warm, no rashes Neurological: no tremor with outstretched hands  ASSESSMENT: 1. Thyrotoxicosis  PLAN:  1. Patient with a h/o suppressed TSH with elevated free T4 (do not have records of the free T3 levels) with unintentional weight loss x 15 lbs but no heat intolerance, hyperdefecation, palpitations, anxiety.  Retrospectively, she was diagnosed with atrial fibrillation in the same year, before her diagnosis of thyrotoxicosis.  She had an ablation and is currently on beta-blockers, Eliquis and flecainide. - she was assumed to have Graves' disease and started on methimazole, currently 5 mg daily.  - she does not appear to have exogenous causes for the low TSH.  - We discussed that possible causes of thyrotoxicosis are:  Graves ds  -discussed the pathology of Graves' disease, is an autoimmune disorder.  Discussed that this can run in families.  Mother had hypothyroidism, which can be  autoimmune. Thyroiditis -unlikely based on timeline toxic multinodular goiter/ toxic adenoma -possible; no clear nodules palpated at today's neck exam - will check the TSH, fT3 and fT4 and also add thyroid stimulating antibodies to screen for Graves' disease.  - If the tests are abnormal, especially if the TSI antibodies are not elevated, we may need an uptake and scan to differentiate between the 3 above possible etiologies.  - we discussed about possible modalities of treatment for the above conditions, to include continuing methimazole use, radioactive iodine ablation or (last resort) surgery.  Since she responded well to methimazole, for now we decided to continue methimazole. - we did discuss about possible side effects of methimazole to include decreased white blood cell count or liver dysfunction.  - at today's visit, she is not tachycardic or tremulous -she continues on the beta-blocker per cardiology. - also, she does not describe other thyrotoxic symptoms; in fact, she feels that she gained weight recently and feels more tired when going upstairs.  We discussed that this can be a side effect of the beta-blocker.  Alternatively, this may indicate overtreatment with methimazole. - I would let her know if we need to change the methimazole dose after the above results return - RTC in 6 months, but possibly sooner for labs  Orders Placed This Encounter  Procedures   TSH   T4, free   T3, free   Thyroid stimulating immunoglobulin   - Total time spent for the visit: 68 Minutes, in precharting, obtaining medical information from the chart, the patient, and her husband; reviewing her  previous labs, imaging evaluations, and treatments, reviewing her symptoms, counseling her about her condition (please see the discussed topics above), and developing a plan to further investigate and treat it; she and her husband had a number of questions which I addressed.  Component     Latest Ref Rng 03/20/2022   TSH     0.35 - 5.50 uIU/mL 4.13   T4,Free(Direct)     0.60 - 1.60 ng/dL 0.77   Triiodothyronine,Free,Serum     2.3 - 4.2 pg/mL 2.7   TSI     <140 % baseline 192 (H)     Graves' antibodies are elevated, confirming Graves' disease. TSH is still high in the normal range.  I will advise her to take the 5 mg of methimazole every other day and repeat the labs in 4 to 5 weeks.  Philemon Kingdom, MD PhD [facility] Endocrinology

## 2022-03-21 LAB — T4, FREE: Free T4: 0.77 ng/dL (ref 0.60–1.60)

## 2022-03-21 LAB — T3, FREE: T3, Free: 2.7 pg/mL (ref 2.3–4.2)

## 2022-03-21 LAB — TSH: TSH: 4.13 u[IU]/mL (ref 0.35–5.50)

## 2022-03-24 ENCOUNTER — Other Ambulatory Visit: Payer: Self-pay | Admitting: Cardiology

## 2022-03-24 DIAGNOSIS — I480 Paroxysmal atrial fibrillation: Secondary | ICD-10-CM

## 2022-03-25 LAB — THYROID STIMULATING IMMUNOGLOBULIN: TSI: 192 % baseline — ABNORMAL HIGH (ref <140)

## 2022-03-25 MED ORDER — METHIMAZOLE 5 MG PO TABS: 5.0000 mg | ORAL_TABLET | ORAL | 3 refills | Status: DC

## 2022-03-25 NOTE — Telephone Encounter (Signed)
Eliquis '5mg'$  refill request received. Patient is 71 years old, weight-72.3kg, Crea-0.88 on 09/24/2021, Diagnosis-Afib, and last seen by Dr. Curt Bears on 03/11/2022. Dose is appropriate based on dosing criteria. Will send in refill to requested pharmacy.

## 2022-03-26 ENCOUNTER — Telehealth: Payer: Self-pay | Admitting: Internal Medicine

## 2022-03-26 NOTE — Telephone Encounter (Signed)
Patient called to schedule labs in 4 weeks as requested.  Patient also has a question about the change in dosage of methimazole methimazole (TAPAZOLE) 5 MG tablet. Patient wants to know if she can take 1/2 pill every day instead of 1 pill every other day because she feels she may get confused about what day she took her previous pill.

## 2022-03-26 NOTE — Telephone Encounter (Signed)
Pt advised.

## 2022-03-28 LAB — HM MAMMOGRAPHY

## 2022-04-03 ENCOUNTER — Encounter: Payer: Self-pay | Admitting: *Deleted

## 2022-04-14 ENCOUNTER — Telehealth: Payer: Self-pay

## 2022-04-14 ENCOUNTER — Ambulatory Visit: Payer: Medicare Other | Admitting: Internal Medicine

## 2022-04-14 NOTE — Patient Outreach (Signed)
  Care Coordination   Initial Visit Note   04/14/2022 Name: Crystal [name] MRN: [id number] DOB: 27-Jun-1951  Crystal [name] is a 71 y.o. year old female who sees Crystal Koch, MD for primary care. I spoke with  Crystal [name] by phone today.  What matters to the patients health and wellness today?  Crystal [name] denies any care coordination, disease management or resource needs at this time.    Goals Addressed             This Visit's Progress    COMPLETED: Care Coordination Activities-no follow up required       Care Coordination Interventions: Discussed Care Coordination Program Encouraged to contact care coordinator or primary care provider if care coordination needs in the future. Conformed patient has care coordinator's contact number.       SDOH assessments and interventions completed:  Yes  SDOH Interventions Today    Flowsheet Row Most Recent Value  SDOH Interventions   Food Insecurity Interventions Intervention Not Indicated  Housing Interventions Intervention Not Indicated  Transportation Interventions Intervention Not Indicated  Utilities Interventions Intervention Not Indicated     Care Coordination Interventions Activated:  Yes  Care Coordination Interventions:  Yes, provided   Follow up plan: No further intervention required.   Encounter Outcome:  Pt. Visit Completed   Crystal Silversmith, RN, MSN, BSN, [place] Coordinator [phone]

## 2022-04-23 ENCOUNTER — Other Ambulatory Visit (INDEPENDENT_AMBULATORY_CARE_PROVIDER_SITE_OTHER): Payer: Medicare Other

## 2022-04-23 DIAGNOSIS — E0590 Thyrotoxicosis, unspecified without thyrotoxic crisis or storm: Secondary | ICD-10-CM

## 2022-04-23 LAB — T4, FREE: Free T4: 0.87 ng/dL (ref 0.60–1.60)

## 2022-04-23 LAB — TSH: TSH: 3.13 u[IU]/mL (ref 0.35–5.50)

## 2022-04-23 LAB — T3, FREE: T3, Free: 3.1 pg/mL (ref 2.3–4.2)

## 2022-06-10 ENCOUNTER — Encounter: Payer: Self-pay | Admitting: Obstetrics & Gynecology

## 2022-06-10 ENCOUNTER — Ambulatory Visit (INDEPENDENT_AMBULATORY_CARE_PROVIDER_SITE_OTHER): Payer: Medicare Other | Admitting: Obstetrics & Gynecology

## 2022-06-10 VITALS — BP 112/72 | HR 64 | Ht 69.25 in | Wt 162.0 lb

## 2022-06-10 DIAGNOSIS — Z9189 Other specified personal risk factors, not elsewhere classified: Secondary | ICD-10-CM

## 2022-06-10 DIAGNOSIS — Z170 Estrogen receptor positive status [ER+]: Secondary | ICD-10-CM

## 2022-06-10 DIAGNOSIS — Z853 Personal history of malignant neoplasm of breast: Secondary | ICD-10-CM

## 2022-06-10 DIAGNOSIS — Z8619 Personal history of other infectious and parasitic diseases: Secondary | ICD-10-CM

## 2022-06-10 DIAGNOSIS — M8589 Other specified disorders of bone density and structure, multiple sites: Secondary | ICD-10-CM

## 2022-06-10 DIAGNOSIS — Z01419 Encounter for gynecological examination (general) (routine) without abnormal findings: Secondary | ICD-10-CM

## 2022-06-10 DIAGNOSIS — Z780 Asymptomatic menopausal state: Secondary | ICD-10-CM

## 2022-06-10 DIAGNOSIS — C50511 Malignant neoplasm of lower-outer quadrant of right female breast: Secondary | ICD-10-CM

## 2022-06-10 NOTE — Progress Notes (Signed)
Crystal [name] 02-19-1951 [id number]   History:    71 y.o. G2P2L2 Married   RP:  Established patient presenting for annual gyn exam    HPI: Postmenopausal, well on no hormone replacement therapy.  No postmenopausal bleeding.  History of right breast cancer in situ in 2016, status post lumpectomy with radiation therapy. Finished Anastrozole 01/2020. Mammo 02/2022 Neg.  No pelvic pain.  Rarely sexually active because of pain and dryness, recommended Coconut oil.  Pap 04/2020 Neg. Will repeat at 3-5 years.  Remote h/o HPV.  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 Bowel movements normal.  Health labs with family physician. Good body mass index at 23.75.  Enjoys walking.  AF controlling on Meds. Hyperthyroidism on Methimazole. BMD at [facility]: 06/2021  Osteopenia -1.9, improved at hips, stable at spine. Vit D, Ca++ total 1.2 g/d.  COLONOSCOPY: 09-05-13.  Past medical history,surgical history, family history and social history were all reviewed and documented in the EPIC chart.  Gynecologic History No LMP recorded. Patient is postmenopausal.  Obstetric History OB History  Gravida Para Term Preterm AB Living  '2 2       2  '$ SAB IAB Ectopic Multiple Live Births               # Outcome Date GA Lbr Len/2nd Weight Sex Delivery Anes PTL Lv  2 Para           1 Para              ROS: A ROS was performed and pertinent positives and negatives are included in the history. GENERAL: No fevers or chills. HEENT: No change in vision, no earache, sore throat or sinus congestion. NECK: No pain or stiffness. CARDIOVASCULAR: No chest pain or pressure. No palpitations. PULMONARY: No shortness of breath, cough or wheeze. GASTROINTESTINAL: No abdominal pain, nausea, vomiting or diarrhea, melena or bright red blood per rectum. GENITOURINARY: No urinary frequency, urgency, hesitancy or dysuria. MUSCULOSKELETAL: No joint or muscle pain, no back pain, no recent trauma. DERMATOLOGIC: No rash, no  itching, no lesions. ENDOCRINE: No polyuria, polydipsia, no heat or cold intolerance. No recent change in weight. HEMATOLOGICAL: No anemia or easy bruising or bleeding. NEUROLOGIC: No headache, seizures, numbness, tingling or weakness. PSYCHIATRIC: No depression, no loss of interest in normal activity or change in sleep pattern.     Exam:   06/10/2022 9:03 AM  BP 112/72  Pulse 64  SpO2 98 %  Weight 162 lb (73.5 kg)  Height 5' 9.25" (1.759 m)   Other Vitals  BMI 23.75 kg/m2  BSA 1.90 m2  Menstrual status Postmenopausal      General appearance : Well developed well nourished female. No acute distress HEENT: Eyes: no retinal hemorrhage or exudates,  Neck supple, trachea midline, no carotid bruits, no thyroidmegaly Lungs: Clear to auscultation, no rhonchi or wheezes, or rib retractions  Heart: Regular rate and rhythm, no murmurs or gallops Breast:Examined in sitting and supine position were symmetrical in appearance, no palpable masses or tenderness,  no skin retraction, no nipple inversion, no nipple discharge, no skin discoloration, no axillary or supraclavicular lymphadenopathy Abdomen: no palpable masses or tenderness, no rebound or guarding Extremities: no edema or skin discoloration or tenderness  Pelvic: Vulva: Normal             Vagina: No gross lesions or discharge  Cervix: No gross lesions or discharge  Uterus  AV, normal size, shape and consistency, non-tender and mobile  Adnexa  Without masses or tenderness  Anus: Normal   Assessment/Plan:  71 y.o. female for annual exam   1. Well female exam with routine gynecological exam Postmenopausal, well on no hormone replacement therapy.  No postmenopausal bleeding.  History of right breast cancer in situ in 2016, status post lumpectomy with radiation therapy. Finished Anastrozole 01/2020. Mammo 02/2022 Neg.  No pelvic pain.  Rarely sexually active because of pain and dryness, recommended Coconut oil.  Pap 04/2020 Neg. Will repeat  at 3-5 years.  Remote h/o HPV.  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 Bowel movements normal.  Health labs with family physician. Good body mass index at 23.75. Enjoys walking.  AF controlling on Meds. Hyperthyroidism on Methimazole. BMD at [facility]: 06/2021  Osteopenia -1.9, improved at hips, stable at spine. Vit D, Ca++ total 1.2 g/d.  COLONOSCOPY: 09-05-13.  2. Postmenopausal Postmenopausal, well on no hormone replacement therapy.  No postmenopausal bleeding.    3. Osteopenia of multiple sites BMD at [facility]: 06/2021  Osteopenia -1.9, improved at hips, stable at spine. Vit D, Ca++ total 1.2 g/d.   4. Malignant neoplasm of lower-outer quadrant of right breast of female, estrogen receptor positive ([place])  5. Personal history of breast cancer History of right breast cancer in situ in 2016, status post lumpectomy with radiation therapy. Finished Anastrozole 01/2020. Mammo 02/2022 Neg.  Breast exam negative today.  6. History of HPV infection Remote h/o HPV, will repeat Pap at 3-5 years.  Other orders - TYRVAYA 0.03 MG/ACT SOLN; Place 1 spray into both nostrils 2 (two) times daily.   Princess Bruins MD, 9:11 AM

## 2022-07-04 ENCOUNTER — Ambulatory Visit (INDEPENDENT_AMBULATORY_CARE_PROVIDER_SITE_OTHER): Payer: Medicare Other | Admitting: Neurology

## 2022-07-04 ENCOUNTER — Encounter: Payer: Self-pay | Admitting: Neurology

## 2022-07-04 VITALS — BP 106/68 | HR 68 | Ht 69.25 in | Wt 164.0 lb

## 2022-07-04 DIAGNOSIS — G629 Polyneuropathy, unspecified: Secondary | ICD-10-CM | POA: Diagnosis not present

## 2022-07-04 NOTE — Progress Notes (Signed)
Follow-up Visit   Date: 07/04/22   Crystal [name] MRN: [id number] DOB: Dec 27, 1950   Interim History: Crystal [name] is a 72 y.o. right-handed Caucasian female with right breast cancer s/p radiation (2016), and hyperthyroidism, and atrial fibrillation returning to the clinic for follow-up of bilateral feet numbness/tingling.  The patient was accompanied to the clinic by self.  History of present illness: Starting around 2011, she started having tingling of the tips of her toes.  She saw Dr. Linda Hedges and was told she may have early signs of neuropathy.  During the summer of 2018, she starting having burning sensation over the toes and the pad of the feet.   She recalls having to take her shoes off her feet because of severe pain.  Lately, she does not have severe pain, but continues to have tingling and numbness of the toes, which is worse on the left.  She denies any imbalance, falls, or weakness.    She denies any history of diabetes mellitus, alcohol abuse, or family history neuropathy.   She had normal EDX of the legs performed in January 2020 which was normal.  UPDATE 07/04/2022:  She continues to have burning and tingling of the balls feet, worse on the left which is worse in the evening.  No weakness, numbness, or imbalance.  She continues to manage office buildings (self-employed) and stays busy and very active.  She does yoga once weekly.  No new complaints.   Medications:  Current Outpatient Medications on File Prior to Visit  Medication Sig Dispense Refill   acetaminophen (TYLENOL) 325 MG tablet Take 325 mg by mouth as needed for moderate pain or headache.     Calcium Carbonate-Vitamin D 600-400 MG-UNIT tablet Take 1 tablet by mouth daily.     cetirizine (ZYRTEC) 10 MG tablet Take 10 mg by mouth daily.     Cholecalciferol (VITAMIN D3) 1000 UNITS CAPS Take 1,000 Units by mouth daily.     ELIQUIS 5 MG TABS tablet TAKE ONE TABLET BY MOUTH TWICE DAILY 180 tablet 1    flecainide (TAMBOCOR) 50 MG tablet TAKE ONE TABLET BY MOUTH TWICE DAILY 60 tablet 11   methimazole (TAPAZOLE) 5 MG tablet Take 1 tablet (5 mg total) by mouth every other day. 45 tablet 3   metoprolol succinate (TOPROL-XL) 50 MG 24 hr tablet Take 1 tablet (50 mg total) by mouth daily. Take with or immediately following a meal. 90 tablet 3   Multiple Vitamin (MULTIVITAMIN) tablet Take 1 tablet by mouth daily.     Polyethyl Glycol-Propyl Glycol (SYSTANE OP) Place 1 drop into both eyes daily as needed (For dryness.).      Probiotic Product (FORTIFY DAILY PROBIOTIC PO) Take 1 capsule by mouth daily.      TYRVAYA 0.03 MG/ACT SOLN Place 1 spray into both nostrils 2 (two) times daily.     No current facility-administered medications on file prior to visit.    Allergies: No Known Allergies  Vital Signs:  BP 106/68   Pulse 68   Ht 5' 9.25" (1.759 m)   Wt 164 lb (74.4 kg)   SpO2 100%   BMI 24.04 kg/m   Neurological Exam: MENTAL STATUS including orientation to time, place, person, recent and remote memory, attention span and concentration, language, and fund of knowledge is normal.  Speech is not dysarthric.  MOTOR:  Motor strength is 5/5 in all extremities, including distally in the feet.  No pronator drift.  Tone is normal.  MSRs:  Reflexes are 2+/4 throughout .  SENSORY:  Intact to vibration and temperature throughout.   COORDINATION/GAIT:   Gait narrow based and stable.   Data: NCS/EMG of the legs 07/20/2018:  This is a normal study of the lower extremities.  In particular, there is no evidence of a sensorimotor polyneuropathy or lumbosacral radiculopathy.    Labs10/10/2016:  ESR 41, vitamin B12 597, vitamin B1 20, SPEP with IFE no M protein, glucose tolerance testing normal  IMPRESSION/PLAN: Early and distal idiopathic neuropathy, manifesting with distal paresthesias,stable without progression.  NCS/EMG is normal, although history highly suggestive of neuropathy.  Symptoms are more  bothersome in the evenings.  She may try OTC lidocaine ointment as needed.  Return to clinic in 1 year  Thank you for allowing me to participate in patient's care.  If I can answer any additional questions, I would be pleased to do so.    Sincerely,    Vincie Linn K. Posey Pronto, DO

## 2022-07-04 NOTE — Patient Instructions (Addendum)
It was great to see you!  You may try lidocaine ointment (Aspercream, Salonpas)  I will see you back in 1 year

## 2022-07-16 ENCOUNTER — Ambulatory Visit (INDEPENDENT_AMBULATORY_CARE_PROVIDER_SITE_OTHER): Payer: Medicare Other | Admitting: Internal Medicine

## 2022-07-16 ENCOUNTER — Encounter: Payer: Self-pay | Admitting: Internal Medicine

## 2022-07-16 VITALS — BP 122/78 | HR 74 | Temp 98.6°F | Ht 69.0 in | Wt 161.0 lb

## 2022-07-16 DIAGNOSIS — J069 Acute upper respiratory infection, unspecified: Secondary | ICD-10-CM | POA: Diagnosis not present

## 2022-07-16 MED ORDER — AMOXICILLIN-POT CLAVULANATE 875-125 MG PO TABS: 1.0000 | ORAL_TABLET | Freq: Two times a day (BID) | ORAL | 0 refills | Status: AC

## 2022-07-16 NOTE — Progress Notes (Signed)
Subjective:    Patient ID: Crystal [name], female    DOB: Nov 02, 1950, 71 y.o.   MRN: [id number]      HPI Crystal [name] is here for  Chief Complaint  Patient presents with   Cough    Drainage , cough , headache , took covid this morning and was negative, cold has been lingering for 3 weeks     She is here for an acute visit for cold symptoms.   Her symptoms started about 3 weeks ago - not getting better.  Worse in mornings.   She is experiencing dec appetitie, chills, PND, runny nose, sinus pain, cough - dry and productive and headaches.   She has tried taking alka seltzer plus, takes zyrtec daily.    Covid test this am was negative.      Medications and allergies reviewed with patient and updated if appropriate.  Current Outpatient Medications on File Prior to Visit  Medication Sig Dispense Refill   acetaminophen (TYLENOL) 325 MG tablet Take 325 mg by mouth as needed for moderate pain or headache.     Calcium Carbonate-Vitamin D 600-400 MG-UNIT tablet Take 1 tablet by mouth daily.     cetirizine (ZYRTEC) 10 MG tablet Take 10 mg by mouth daily.     Cholecalciferol (VITAMIN D3) 1000 UNITS CAPS Take 1,000 Units by mouth daily.     ELIQUIS 5 MG TABS tablet TAKE ONE TABLET BY MOUTH TWICE DAILY 180 tablet 1   flecainide (TAMBOCOR) 50 MG tablet TAKE ONE TABLET BY MOUTH TWICE DAILY 60 tablet 11   methimazole (TAPAZOLE) 5 MG tablet Take 1 tablet (5 mg total) by mouth every other day. 45 tablet 3   metoprolol succinate (TOPROL-XL) 50 MG 24 hr tablet Take 1 tablet (50 mg total) by mouth daily. Take with or immediately following a meal. 90 tablet 3   Multiple Vitamin (MULTIVITAMIN) tablet Take 1 tablet by mouth daily.     Polyethyl Glycol-Propyl Glycol (SYSTANE OP) Place 1 drop into both eyes daily as needed (For dryness.).      Probiotic Product (FORTIFY DAILY PROBIOTIC PO) Take 1 capsule by mouth daily.      TYRVAYA 0.03 MG/ACT SOLN Place 1 spray into both nostrils 2 (two) times  daily.     No current facility-administered medications on file prior to visit.    Review of Systems  Constitutional:  Positive for appetite change and chills. Negative for fever.  HENT:  Positive for postnasal drip, rhinorrhea and sinus pressure. Negative for congestion, ear pain and sore throat.   Respiratory:  Positive for cough (occ productive - worse in am and evening). Negative for chest tightness, shortness of breath and wheezing.   Gastrointestinal:  Negative for diarrhea and nausea.  Musculoskeletal:  Negative for myalgias.  Neurological:  Positive for headaches. Negative for dizziness and light-headedness.       Objective:   Vitals:   07/16/22 1457  BP: 122/78  Pulse: 74  Temp: 98.6 F (37 C)  SpO2: 99%   BP Readings from Last 3 Encounters:  07/16/22 122/78  07/04/22 106/68  06/10/22 112/72   Wt Readings from Last 3 Encounters:  07/16/22 161 lb (73 kg)  07/04/22 164 lb (74.4 kg)  06/10/22 162 lb (73.5 kg)   Body mass index is 23.78 kg/m.    Physical Exam Constitutional:      General: She is not in acute distress.    Appearance: Normal appearance. She is not ill-appearing.  HENT:  Head: Normocephalic and atraumatic.     Right Ear: Tympanic membrane, ear canal and external ear normal.     Left Ear: Tympanic membrane, ear canal and external ear normal.     Mouth/Throat:     Mouth: Mucous membranes are moist.     Pharynx: No oropharyngeal exudate or posterior oropharyngeal erythema.  Eyes:     Conjunctiva/sclera: Conjunctivae normal.  Cardiovascular:     Rate and Rhythm: Normal rate and regular rhythm.  Pulmonary:     Effort: Pulmonary effort is normal. No respiratory distress.     Breath sounds: Normal breath sounds. No wheezing or rales.  Musculoskeletal:     Cervical back: Neck supple. No tenderness.  Lymphadenopathy:     Cervical: No cervical adenopathy.  Skin:    General: Skin is warm and dry.  Neurological:     Mental Status: She is  alert.            Assessment & Plan:    URI: Acute Ongoing x 3 weeks - possible sinus infection Concern for bacterial cuase Start Augmentin 875-125 mg BID x 7 day otc cold medications Rest, fluid Call if no improvement

## 2022-07-16 NOTE — Patient Instructions (Addendum)
Medications changes include :   Augmentin twice daily x 7 days     Return if symptoms worsen or fail to improve.

## 2022-08-07 ENCOUNTER — Encounter (HOSPITAL_COMMUNITY): Payer: Self-pay | Admitting: *Deleted

## 2022-09-19 ENCOUNTER — Ambulatory Visit: Payer: Medicare Other | Attending: Cardiology | Admitting: Cardiology

## 2022-09-19 ENCOUNTER — Encounter: Payer: Self-pay | Admitting: Cardiology

## 2022-09-19 VITALS — BP 100/72 | HR 67 | Ht 69.5 in | Wt 162.6 lb

## 2022-09-19 DIAGNOSIS — Z79899 Other long term (current) drug therapy: Secondary | ICD-10-CM | POA: Diagnosis present

## 2022-09-19 DIAGNOSIS — I480 Paroxysmal atrial fibrillation: Secondary | ICD-10-CM | POA: Insufficient documentation

## 2022-09-19 DIAGNOSIS — D6869 Other thrombophilia: Secondary | ICD-10-CM | POA: Insufficient documentation

## 2022-09-19 NOTE — Patient Instructions (Signed)
Medication Instructions:  Your physician recommends that you continue on your current medications as directed. Please refer to the Current Medication list given to you today.  *If you need a refill on your cardiac medications before your next appointment, please call your pharmacy*   Lab Work: Eliquis surveillance labs today: BMET & CBC  If you have labs (blood work) drawn today and your tests are completely normal, you will receive your results only by: MyChart Message (if you have MyChart) OR A paper copy in the mail If you have any lab test that is abnormal or we need to change your treatment, we will call you to review the results.   Testing/Procedures: None ordered   Follow-Up: At [facility], you and your health needs are our priority.  As part of our continuing mission to provide you with exceptional heart care, we have created designated Provider Care Teams.  These Care Teams include your primary Cardiologist (physician) and Advanced Practice Providers (APPs -  Physician Assistants and Nurse Practitioners) who all work together to provide you with the care you need, when you need it.  Your next appointment:   Mid August   The format for your next appointment:   In Person  Provider:   Allegra Lai, MD{  Thank you for choosing [facility]!!   Trinidad Curet, RN [phone]

## 2022-09-19 NOTE — Progress Notes (Signed)
Electrophysiology Office Note   Date:  09/19/2022   ID:  Welcome, Crystal [name] 23, 1952, MRN [id number]  PCP:  Hoyt Koch, MD  Cardiologist: Burt Knack Primary Electrophysiologist:  Debrah Granderson Meredith Leeds, MD    Chief Complaint: Atrial fibrillation   History of Present Illness: Crystal [name] is a 72 y.o. female who is being seen today for the evaluation of atrial fibrillation at the request of Hoyt Koch, *. Presenting today for electrophysiology evaluation.  She has a history significant for right-sided breast cancer and paroxysmal atrial fibrillation.  She was diagnosed with atrial fibrillation 01/18/2020.  She is status post ablation 05/25/2020.  She had recurrent episodes of atrial fibrillation and is now on flecainide.  Today, denies symptoms of palpitations, chest pain, shortness of breath, orthopnea, PND, lower extremity edema, claudication, dizziness, presyncope, syncope, bleeding, or neurologic sequela. The patient is tolerating medications without difficulties.  Currently she feels well.  She has no chest pain or shortness of breath.  She is able to do all of her daily activities without restriction.  She has had a small uptake in her atrial fibrillation burden.  Her most recent episode lasted for approximately 3 hours with heart rates as high as 160 bpm.  She felt tired and fatigued when she was in atrial fibrillation.    Past Medical History:  Diagnosis Date   BACK PAIN, LUMBAR    Breast cancer of lower-outer quadrant of right female breast ([place]) 10/24/2014   Breast cancer, right breast    FIBROADENOMA, BREAST 2006   HPV in female    Hyperthyroidism    New onset atrial fibrillation ([place]) 01/06/2020   Palpitations 01/06/2020   PARESTHESIA    toes   PONV (postoperative nausea and vomiting)    Radiation 11/23/14-12/25/14   right breast 42.72 gray, lumpectomy cavity boosted to 54.72 gray   Routine health maintenance 03/30/2013   Colonoscopy - for  colonoscopy (Sept '14)  Immunization  Tdap Oct '14    Spider veins of both lower extremities 05/09/2019   Wears glasses    Past Surgical History:  Procedure Laterality Date   ATRIAL FIBRILLATION ABLATION N/A 05/25/2020   Procedure: ATRIAL FIBRILLATION ABLATION;  Surgeon: Constance Haw, MD;  Location: [facility];  Service: Cardiovascular;  Laterality: N/A;   atrophic vaginitis     BREAST LUMPECTOMY WITH RADIOACTIVE SEED LOCALIZATION Right 10/18/2014   Procedure: BREAST LUMPECTOMY WITH RADIOACTIVE SEED LOCALIZATION;  Surgeon: Coralie Keens, MD;  Location: [facility];  Service: General;  Laterality: Right;   COLONOSCOPY  09/05/2013   D & C and hysteroscopy  11/1998   for uterine polyp   DILATATION & CURRETTAGE/HYSTEROSCOPY WITH RESECTOCOPE N/A 05/30/2015   Procedure: [place];  Surgeon: Princess Bruins, MD;  Location: [facility];  Service: Gynecology;  Laterality: N/A;   DILATION AND CURETTAGE OF UTERUS     Sterotactix needle biopsy  2000   breast calcification     Current Outpatient Medications  Medication Sig Dispense Refill   acetaminophen (TYLENOL) 325 MG tablet Take 325 mg by mouth as needed for moderate pain or headache.     Calcium Carbonate-Vitamin D 600-400 MG-UNIT tablet Take 1 tablet by mouth daily.     cetirizine (ZYRTEC) 10 MG tablet Take 10 mg by mouth daily.     Cholecalciferol (VITAMIN D3) 1000 UNITS CAPS Take 1,000 Units by mouth daily.     ELIQUIS 5 MG TABS tablet TAKE ONE TABLET BY MOUTH TWICE  DAILY 180 tablet 1   flecainide (TAMBOCOR) 50 MG tablet TAKE ONE TABLET BY MOUTH TWICE DAILY 60 tablet 11   methimazole (TAPAZOLE) 5 MG tablet Take 1 tablet (5 mg total) by mouth every other day. 45 tablet 3   metoprolol succinate (TOPROL-XL) 50 MG 24 hr tablet Take 1 tablet (50 mg total) by mouth daily. Take with or immediately following a meal. 90 tablet 3   Multiple Vitamin (MULTIVITAMIN) tablet Take 1  tablet by mouth daily.     Polyethyl Glycol-Propyl Glycol (SYSTANE OP) Place 1 drop into both eyes daily as needed (For dryness.).      Probiotic Product (FORTIFY DAILY PROBIOTIC PO) Take 1 capsule by mouth daily.      TYRVAYA 0.03 MG/ACT SOLN Place 1 spray into both nostrils 2 (two) times daily.     XIIDRA 5 % SOLN Apply 1 drop to eye 2 (two) times daily.     No current facility-administered medications for this visit.    Allergies:   Patient has no known allergies.   Social History:  The patient  reports that she has never smoked. She has never used smokeless tobacco. She reports current alcohol use. She reports that she does not use drugs.   Family History:  The patient's family history includes Alzheimer's disease in her brother; Arrhythmia in her mother; Cancer in her mother; Coronary artery disease in her father and maternal grandfather; Diabetes in her father; GER disease in her mother; Hyperlipidemia in her father; Hypertension in her father and mother; Hypothyroidism in her mother; Osteoporosis in her mother.   ROS:  Please see the history of present illness.   Otherwise, review of systems is positive for none.   All other systems are reviewed and negative.   PHYSICAL EXAM: VS:  BP 100/72   Pulse 67   Ht 5' 9.5" (1.765 m)   Wt 162 lb 9.6 oz (73.8 kg)   SpO2 98%   BMI 23.67 kg/m  , BMI Body mass index is 23.67 kg/m. GEN: Well nourished, well developed, in no acute distress  HEENT: normal  Neck: no JVD, carotid bruits, or masses Cardiac: RRR; no murmurs, rubs, or gallops,no edema  Respiratory:  clear to auscultation bilaterally, normal work of breathing GI: soft, nontender, nondistended, + BS MS: no deformity or atrophy  Skin: warm and dry Neuro:  Strength and sensation are intact Psych: euthymic mood, full affect  EKG:  EKG is ordered today. Personal review of the ekg ordered shows sinus rhythm   Recent Labs: 09/24/2021: ALT 40; BUN 19; Creatinine, Ser 0.88; Potassium  4.4; Sodium 137 04/23/2022: TSH 3.13    Lipid Panel     Component Value Date/Time   CHOL 196 09/24/2021 1434   TRIG 92.0 09/24/2021 1434   HDL 86.00 09/24/2021 1434   CHOLHDL 2 09/24/2021 1434   VLDL 18.4 09/24/2021 1434   LDLCALC 91 09/24/2021 1434     Wt Readings from Last 3 Encounters:  09/19/22 162 lb 9.6 oz (73.8 kg)  07/16/22 161 lb (73 kg)  07/04/22 164 lb (74.4 kg)      Other studies Reviewed: Additional studies/ records that were reviewed today include: TTE 01/31/20  Review of the above records today demonstrates:   1. Left ventricular ejection fraction, by estimation, is 60 to 65%. The  left ventricle has normal function. The left ventricle has no regional  wall motion abnormalities. Left ventricular diastolic parameters were  normal.   2. Right ventricular systolic function is normal.  The right ventricular  size is normal. There is normal pulmonary artery systolic pressure.   3. The mitral valve is normal in structure. Trivial mitral valve  regurgitation. No evidence of mitral stenosis.   4. The aortic valve is tricuspid. Aortic valve regurgitation is trivial.  No aortic stenosis is present.   5. The inferior vena cava is normal in size with greater than 50%  respiratory variability, suggesting right atrial pressure of 3 mmHg.   Monitor 02/21/20 personally reviewed The basic rhythm is normal sinus with an average HR of 71 bpm Atrial fibrillation with rapid ventricular rate occurs with a 5% burden No high-grade heart block or pathologic pauses No bradycardic events No significant ventricular ectopy    ASSESSMENT AND PLAN:  1.  Paroxysmal atrial fibrillation: Status post ablation 05/25/2020.  CHA2DS2-VASc of 2.  Currently on Eliquis, flecainide, metoprolol.  She has had further episodes of atrial fibrillation.  I offered to increase her flecainide.  She would like to see if the episodes continue and if so we Thiago Ragsdale increase to 100 mg twice daily.  She Diallo Ponder need a  treadmill test if we do increase the dose.  Continue with current management.  2.  Secondary to coagula state: Currently on Eliquis for atrial fibrillation  3.  High risk medication monitoring: Currently on flecainide.  QRS remains narrow.   Current medicines are reviewed at length with the patient today.   The patient does not have concerns regarding her medicines.  The following changes were made today: none  Labs/ tests ordered today include:  Orders Placed This Encounter  Procedures   Basic metabolic panel   CBC   EKG 12-Lead     Disposition:   FU 5 months  Signed, Lindsie Simar Meredith Leeds, MD  09/19/2022 2:47 PM     [place] [address] [place] [address] [phone] (office) [phone] (fax)

## 2022-09-20 LAB — CBC
Hematocrit: 35.8 % (ref 34.0–46.6)
Hemoglobin: 12.1 g/dL (ref 11.1–15.9)
MCH: 30.6 pg (ref 26.6–33.0)
MCHC: 33.8 g/dL (ref 31.5–35.7)
MCV: 91 fL (ref 79–97)
Platelets: 252 10*3/uL (ref 150–450)
RBC: 3.95 x10E6/uL (ref 3.77–5.28)
RDW: 12 % (ref 11.7–15.4)
WBC: 6.4 10*3/uL (ref 3.4–10.8)

## 2022-09-20 LAB — BASIC METABOLIC PANEL
BUN/Creatinine Ratio: 18 (ref 12–28)
BUN: 19 mg/dL (ref 8–27)
CO2: 26 mmol/L (ref 20–29)
Calcium: 10 mg/dL (ref 8.7–10.3)
Chloride: 103 mmol/L (ref 96–106)
Creatinine, Ser: 1.03 mg/dL — ABNORMAL HIGH (ref 0.57–1.00)
Glucose: 81 mg/dL (ref 70–99)
Potassium: 4.5 mmol/L (ref 3.5–5.2)
Sodium: 143 mmol/L (ref 134–144)
eGFR: 58 mL/min/{1.73_m2} — ABNORMAL LOW (ref >59)

## 2022-09-22 ENCOUNTER — Encounter: Payer: Self-pay | Admitting: Internal Medicine

## 2022-09-22 ENCOUNTER — Telehealth: Payer: Self-pay

## 2022-09-22 ENCOUNTER — Ambulatory Visit (INDEPENDENT_AMBULATORY_CARE_PROVIDER_SITE_OTHER): Payer: Medicare Other | Admitting: Internal Medicine

## 2022-09-22 ENCOUNTER — Other Ambulatory Visit: Payer: Self-pay | Admitting: Cardiology

## 2022-09-22 VITALS — BP 126/72 | HR 86 | Ht 69.5 in | Wt 163.0 lb

## 2022-09-22 DIAGNOSIS — E0590 Thyrotoxicosis, unspecified without thyrotoxic crisis or storm: Secondary | ICD-10-CM | POA: Diagnosis not present

## 2022-09-22 DIAGNOSIS — I480 Paroxysmal atrial fibrillation: Secondary | ICD-10-CM

## 2022-09-22 DIAGNOSIS — E0500 Thyrotoxicosis with diffuse goiter without thyrotoxic crisis or storm: Secondary | ICD-10-CM | POA: Diagnosis not present

## 2022-09-22 NOTE — Telephone Encounter (Signed)
Contacted Roselind Rily to schedule their annual wellness visit. Appointment made for 09/29/22.  Norton Blizzard, [place] ([facility])  [facility] [phone]

## 2022-09-22 NOTE — Patient Instructions (Addendum)
Please stop at the lab.  For now, continue Methimazole 5 mg every other day.  You should have an endocrinology follow-up appointment in 6 months.

## 2022-09-22 NOTE — Progress Notes (Unsigned)
Patient ID: Crystal [name], female   DOB: Nov 16, 1950, 72 y.o.   MRN: [id number]  HPI  Crystal [name] is a 72 y.o.-year-old female, returning for follow-up for thyrotoxicosis.  She previously saw Dr. Loanne Drilling, last visit 6 months ago.  She is here with her husband who offers information about her past medical history and medications.  Interim history: She continues to have palpitations, which are not new for her.   She also mentions weight gain (4 pounds since last visit), especially in her midsection. She also has fatigue.  Reviewed and addended history: Patient describes that she was diagnosed with thyrotoxicosis in 2021 - after she presented to her PCP with due to unintentional weight loss (15 lbs in 1 year).  A TSH was checked at that time and it was low, while the free T4 was normal.  She was referred to Dr. Loanne Drilling.  At the time of the visit, a TSH was suppressed and free T4 was high.  She was started on methimazole.  She had to decrease the dose of methimazole in 2022.  She is currently on: - Methimazole (MMI) 5 mg daily >> 2.5 mg daily (dose decreased 02/2022) - Toprol XL 50 mg daily.  Also on flecainide.  I reviewed pt's thyroid tests: Lab Results  Component Value Date   TSH 3.13 04/23/2022   TSH 4.13 03/20/2022   TSH 2.20 10/16/2021   TSH 3.78 07/16/2021   TSH 2.51 05/10/2021   TSH 16.04 (H) 03/08/2021   TSH 4.36 12/19/2020   TSH <0.01 Repeated and verified X2. (L) 09/04/2020   TSH 0.08 (L) 01/05/2020   TSH 1.02 05/09/2019   FREET4 0.87 04/23/2022   FREET4 0.77 03/20/2022   FREET4 0.79 10/16/2021   FREET4 0.83 07/16/2021   FREET4 0.85 05/10/2021   FREET4 0.53 (L) 03/08/2021   FREET4 0.46 (L) 12/19/2020   FREET4 1.79 (H) 09/06/2020   FREET4 1.4 01/05/2020   T3FREE 3.1 04/23/2022   T3FREE 2.7 03/20/2022   Lab Results  Component Value Date   T3FREE 3.1 04/23/2022   T3FREE 2.7 03/20/2022   Antithyroid antibodies: Lab Results  Component Value Date   TSI 192  (H) 03/20/2022   Pt denies: - feeling nodules in neck - hoarseness - dysphagia - choking  She denies: - excessive sweating/heat intolerance - tremors - anxiety - insomnia - weight loss   Pt does have a FH of thyroid ds. : mother with hypothyroidism. No FH of thyroid cancer. No h/o radiation tx to head or neck. No recent contrast studies. No steroid use. No herbal supplements. No Biotin use.  Pt. also has a history of breast cancer, s/p RxTx.    She also has paroxysmal atrial fibrillation in 2021 (before her diagnosis of Thyrotoxicosis) - had an ablation procedure; on metoprolol, flecainide, Eliquis.  ROS: Constitutional: + see HPI  Past Medical History:  Diagnosis Date   BACK PAIN, LUMBAR    Breast cancer of lower-outer quadrant of right female breast ([place]) 10/24/2014   Breast cancer, right breast    FIBROADENOMA, BREAST 2006   HPV in female    Hyperthyroidism    New onset atrial fibrillation ([place]) 01/06/2020   Palpitations 01/06/2020   PARESTHESIA    toes   PONV (postoperative nausea and vomiting)    Radiation 11/23/14-12/25/14   right breast 42.72 gray, lumpectomy cavity boosted to 54.72 gray   Routine health maintenance 03/30/2013   Colonoscopy - for colonoscopy (Sept '14)  Immunization  Tdap Oct '14  Spider veins of both lower extremities 05/09/2019   Wears glasses    Past Surgical History:  Procedure Laterality Date   ATRIAL FIBRILLATION ABLATION N/A 05/25/2020   Procedure: ATRIAL FIBRILLATION ABLATION;  Surgeon: Constance Haw, MD;  Location: [facility];  Service: Cardiovascular;  Laterality: N/A;   atrophic vaginitis     BREAST LUMPECTOMY WITH RADIOACTIVE SEED LOCALIZATION Right 10/18/2014   Procedure: BREAST LUMPECTOMY WITH RADIOACTIVE SEED LOCALIZATION;  Surgeon: Coralie Keens, MD;  Location: [facility];  Service: General;  Laterality: Right;   COLONOSCOPY  09/05/2013   D & C and hysteroscopy  11/1998   for uterine polyp    DILATATION & CURRETTAGE/HYSTEROSCOPY WITH RESECTOCOPE N/A 05/30/2015   Procedure: [place];  Surgeon: Princess Bruins, MD;  Location: [facility];  Service: Gynecology;  Laterality: N/A;   DILATION AND CURETTAGE OF UTERUS     Sterotactix needle biopsy  2000   breast calcification   Social History   Socioeconomic History   Marital status: Married    Spouse name: howard   Number of children: 2   Years of education: 16   Highest education level: Not on file  Occupational History   Occupation: PROP[employer] M[employer]    Employer: [employer]  Tobacco Use   Smoking status: Never   Smokeless tobacco: Never  Vaping Use   Vaping Use: Never used  Substance and Sexual Activity   Alcohol use: Yes    Comment: 1 a month   Drug use: No   Sexual activity: Yes    Partners: Male    Birth control/protection: Post-menopausal    Comment: 1st intercourse- 18, partners- less than 5  Other Topics Concern   Not on file  Social History Narrative   HSG, [place], Married '78,  1 son '81, 1 daughter '84, 1 grandson '12.    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 ACP - discussed with patient.    Refer - [email or web address]   Highest level of education:  BS   Right handed   Two story home   Social Determinants of Health   Financial Resource Strain: Low Risk  (09/24/2021)   Overall Financial Resource Strain (CARDIA)    Difficulty of Paying Living Expenses: Not hard at all  Food Insecurity: No Food Insecurity (04/14/2022)   Hunger Vital Sign    Worried About Running Out of Food in the Last Year: Never true    Ran Out of Food in the Last Year: Never true  Transportation Needs: No Transportation Needs (09/24/2021)   PRAPARE - Hydrologist (Medical): No    Lack of Transportation (Non-Medical): No  Physical Activity: Sufficiently Active (09/24/2021)   Exercise Vital Sign    Days of  Exercise per Week: 5 days    Minutes of Exercise per Session: 30 min  Stress: No Stress Concern Present (09/24/2021)   [place]    Feeling of Stress : Not at all  Social Connections: [place] (09/24/2021)   Social Connection and Isolation Panel [NHANES]    Frequency of Communication with Friends and Family: More than three times a week    Frequency of Social Gatherings with Friends and Family: More than three times a week    Attends Religious Services: More than 4 times per year    Active Member of Clubs or Organizations: Yes    Attends  Club or Organization Meetings: More than 4 times per year    Marital Status: Married  Human resources officer Violence: Not At Risk (09/24/2021)   Humiliation, Afraid, Rape, and Kick questionnaire    Fear of Current or Ex-Partner: No    Emotionally Abused: No    Physically Abused: No    Sexually Abused: No   Current Outpatient Medications on File Prior to Visit  Medication Sig Dispense Refill   acetaminophen (TYLENOL) 325 MG tablet Take 325 mg by mouth as needed for moderate pain or headache.     Calcium Carbonate-Vitamin D 600-400 MG-UNIT tablet Take 1 tablet by mouth daily.     cetirizine (ZYRTEC) 10 MG tablet Take 10 mg by mouth daily.     Cholecalciferol (VITAMIN D3) 1000 UNITS CAPS Take 1,000 Units by mouth daily.     ELIQUIS 5 MG TABS tablet TAKE ONE TABLET BY MOUTH TWICE DAILY 180 tablet 1   flecainide (TAMBOCOR) 50 MG tablet TAKE ONE TABLET BY MOUTH TWICE DAILY 60 tablet 11   methimazole (TAPAZOLE) 5 MG tablet Take 1 tablet (5 mg total) by mouth every other day. 45 tablet 3   metoprolol succinate (TOPROL-XL) 50 MG 24 hr tablet Take 1 tablet (50 mg total) by mouth daily. Take with or immediately following a meal. 90 tablet 3   Multiple Vitamin (MULTIVITAMIN) tablet Take 1 tablet by mouth daily.     Polyethyl Glycol-Propyl Glycol (SYSTANE OP) Place 1 drop into both eyes daily as  needed (For dryness.).      Probiotic Product (FORTIFY DAILY PROBIOTIC PO) Take 1 capsule by mouth daily.      TYRVAYA 0.03 MG/ACT SOLN Place 1 spray into both nostrils 2 (two) times daily.     XIIDRA 5 % SOLN Apply 1 drop to eye 2 (two) times daily.     No current facility-administered medications on file prior to visit.   No Known Allergies Family History  Problem Relation Age of Onset   Hypothyroidism Mother    GER disease Mother    Osteoporosis Mother    Hypertension Mother    Cancer Mother        hodgkins lympoma, uterine cancer   Arrhythmia Mother    Hyperlipidemia Father    Hypertension Father    Diabetes Father    Coronary artery disease Father    Alzheimer's disease Brother    Coronary artery disease Maternal Grandfather    COPD Neg Hx    Esophageal cancer Neg Hx    Rectal cancer Neg Hx    Stomach cancer Neg Hx    PE: BP 126/72 (BP Location: Left Arm, Patient Position: Sitting, Cuff Size: Normal)   Pulse 86   Ht 5' 9.5" (1.765 m)   Wt 163 lb (73.9 kg)   SpO2 97%   BMI 23.73 kg/m   Wt Readings from Last 3 Encounters:  09/22/22 163 lb (73.9 kg)  09/19/22 162 lb 9.6 oz (73.8 kg)  07/16/22 161 lb (73 kg)   Constitutional: normal weight, in NAD Eyes: EOMI, no exophthalmos, no lid lag, no stare ENT: no thyromegaly, no cervical LAD Cardiovascular: RRR, No MRG Respiratory: CTA B Musculoskeletal: no deformities Skin: no rashes Neurological: no tremor with outstretched hands  ASSESSMENT: 1. Graves disease  PLAN:  1. Patient with history of a suppressed TSH with elevated free T4 (I do not have records of the free T3 level), with unintentional weight loss of 15 lbs but no heat intolerance, hyperdefecation, palpitations, anxiety.  Retrospectively, she  was diagnosed with atrial fibrillation in the same year, before her diagnosis of thyrotoxicosis.  She had an ablation and then started on beta-blockers, Eliquis, and flecainide. -She was assumed to have Graves'  disease and started on methimazole.  At last visit, she was taking 5 mg daily.  At that time, her TSH was higher in the normal range, at 4.13, so I advised her to decrease the methimazole dose to 5 mg every other day.  Her TSI antibodies were elevated, pointing towards a diagnosis of Graves' disease.  She contacted [name] afterwards to see if she could take half of a tablet of methimazole daily rather than a full tablet every other day.  She is currently on half tablet daily.  On this dose, latest TSH was lower, but still normal, in 03/2022 -At today's visit, she has no thyrotoxic signs or symptoms.  She continues to have palpitations, but these are not new and she takes flecainide on a chronic basis. -We will recheck her TFTs now and change the methimazole dose accordingly.  We did discuss about possibly coming off methimazole, but patients are more likely to maintain remission if they are TSI antibodies are undetectable.  Her level was still elevated at last visit.  Will not recheck this today, but will do so at next visit. -We did discuss about possible modalities of treatment for Graves' disease to include continuing methimazole use, radioactive iodine ablation or, last resort, surgery.  Since she is responding very well to methimazole and she uses a very low-dose, for now, we decided to continue with methimazole.  She is aware about possible side effects which include decreased white cell blood count and liver dysfunction. -At today's visit, she is not tachycardic or tremulous.  She continues on beta-blocker per cardiology. -she has dry eyes, for which she is on [place]  We discussed that only xerophthalmia is not enough to diagnose thyroid eye disease.  She does not have eye pressure, exophthalmos, lid lag, stare, or signs of inflammation. -I will see her back in 6 months but possibly sooner for labs  - Total time spent for the visit: 25 min, in precharting, post charting, obtaining medical information  from the chart and from the patient, reviewing her  previous labs, evaluations, and treatments, reviewing her symptoms, counseling her about her condition (please see the discussed topics above), and developing a plan to further investigate and treat it.  Component     Latest Ref Rng 09/22/2022  TSH     0.35 - 5.50 uIU/mL 0.99   T4,Free(Direct)     0.60 - 1.60 ng/dL 0.83   Triiodothyronine,Free,Serum     2.3 - 4.2 pg/mL 2.4   Normal TFTs.  Will continue the same dose of methimazole for now.  Philemon Kingdom, MD PhD [facility]

## 2022-09-22 NOTE — Telephone Encounter (Signed)
Prescription refill request for Eliquis received. Indication: afib Last office visit: 09/19/22 Scr: 1.03 09/19/22 epic Age: 72 Weight: 73kg

## 2022-09-23 LAB — T3, FREE: T3, Free: 2.4 pg/mL (ref 2.3–4.2)

## 2022-09-23 LAB — T4, FREE: Free T4: 0.83 ng/dL (ref 0.60–1.60)

## 2022-09-23 LAB — TSH: TSH: 0.99 u[IU]/mL (ref 0.35–5.50)

## 2022-09-29 ENCOUNTER — Ambulatory Visit (INDEPENDENT_AMBULATORY_CARE_PROVIDER_SITE_OTHER): Payer: Medicare Other

## 2022-09-29 VITALS — Ht 69.5 in | Wt 163.0 lb

## 2022-09-29 DIAGNOSIS — Z0000 Encounter for general adult medical examination without abnormal findings: Secondary | ICD-10-CM | POA: Diagnosis not present

## 2022-09-29 NOTE — Progress Notes (Signed)
I connected with  Crystal [name] on 09/29/22 by a audio enabled telemedicine application and verified that I am speaking with the correct person using two identifiers.  Patient Location: Home  Provider Location: Office/Clinic  I discussed the limitations of evaluation and management by telemedicine. The patient expressed understanding and agreed to proceed.  Patient Medicare AWV questionnaire was completed by the patient on 09/29/2022; I have confirmed that all information answered by patient is correct and no changes since this date.    Subjective:   Crystal [name] is a 72 y.o. female who presents for Medicare Annual (Subsequent) preventive examination.  Review of Systems     Cardiac Risk Factors include: advanced age (>44men, >5 women);family history of premature cardiovascular disease     Objective:    Today's Vitals   09/29/22 1055 09/29/22 1056  Weight: 163 lb (73.9 kg)   Height: 5' 9.5" (1.765 m)   PainSc: 0-No pain 0-No pain   Body mass index is 23.73 kg/m.     07/04/2022    8:39 AM 09/24/2021    1:16 PM 07/03/2021    8:51 AM 07/17/2020   12:41 PM 05/25/2020    9:04 AM 02/13/2020    2:18 PM 07/20/2019    8:36 AM  Advanced Directives  Does Patient Have a Medical Advance Directive? Yes Yes Yes Yes Yes Yes Yes  Type of Paramedic of [place];Living will;Out of facility DNR (pink MOST or yellow form) Living will;Healthcare Power of [place];Living will;Out of facility DNR (pink MOST or yellow form) [place];Living will [place];Living will  [place];Living will  Does patient want to make changes to medical advance directive?  No - Patient declined     No - Patient declined  Copy of [place] in Chart?  No - copy requested  No - copy requested     Would patient like information on creating a medical advance directive?  No - Patient declined          Current Medications (verified) Outpatient Encounter Medications as of 09/29/2022  Medication Sig   acetaminophen (TYLENOL) 325 MG tablet Take 325 mg by mouth as needed for moderate pain or headache.   Calcium Carbonate-Vitamin D 600-400 MG-UNIT tablet Take 1 tablet by mouth daily.   cetirizine (ZYRTEC) 10 MG tablet Take 10 mg by mouth daily.   Cholecalciferol (VITAMIN D3) 1000 UNITS CAPS Take 1,000 Units by mouth daily.   ELIQUIS 5 MG TABS tablet TAKE ONE TABLET BY MOUTH TWICE DAILY   flecainide (TAMBOCOR) 50 MG tablet TAKE ONE TABLET BY MOUTH TWICE DAILY   methimazole (TAPAZOLE) 5 MG tablet Take 1 tablet (5 mg total) by mouth every other day.   metoprolol succinate (TOPROL-XL) 50 MG 24 hr tablet Take 1 tablet (50 mg total) by mouth daily. Take with or immediately following a meal.   Multiple Vitamin (MULTIVITAMIN) tablet Take 1 tablet by mouth daily.   Polyethyl Glycol-Propyl Glycol (SYSTANE OP) Place 1 drop into both eyes daily as needed (For dryness.).    Probiotic Product (FORTIFY DAILY PROBIOTIC PO) Take 1 capsule by mouth daily.    TYRVAYA 0.03 MG/ACT SOLN Place 1 spray into both nostrils 2 (two) times daily.   XIIDRA 5 % SOLN Apply 1 drop to eye 2 (two) times daily.   No facility-administered encounter medications on file as of 09/29/2022.    Allergies (verified) Patient has no known allergies.  History: Past Medical History:  Diagnosis Date   BACK PAIN, LUMBAR    Breast cancer of lower-outer quadrant of right female breast 10/24/2014   Breast cancer, right breast    FIBROADENOMA, BREAST 2006   HPV in female    Hyperthyroidism    New onset atrial fibrillation 01/06/2020   Palpitations 01/06/2020   PARESTHESIA    toes   PONV (postoperative nausea and vomiting)    Radiation 11/23/14-12/25/14   right breast 42.72 gray, lumpectomy cavity boosted to 54.72 gray   Routine health maintenance 03/30/2013   Colonoscopy - for colonoscopy (Sept '14)  Immunization  Tdap Oct '14     Spider veins of both lower extremities 05/09/2019   Wears glasses    Past Surgical History:  Procedure Laterality Date   ATRIAL FIBRILLATION ABLATION N/A 05/25/2020   Procedure: ATRIAL FIBRILLATION ABLATION;  Surgeon: Constance Haw, MD;  Location: [facility];  Service: Cardiovascular;  Laterality: N/A;   atrophic vaginitis     BREAST LUMPECTOMY WITH RADIOACTIVE SEED LOCALIZATION Right 10/18/2014   Procedure: BREAST LUMPECTOMY WITH RADIOACTIVE SEED LOCALIZATION;  Surgeon: Coralie Keens, MD;  Location: [facility];  Service: General;  Laterality: Right;   COLONOSCOPY  09/05/2013   D & C and hysteroscopy  11/1998   for uterine polyp   DILATATION & CURRETTAGE/HYSTEROSCOPY WITH RESECTOCOPE N/A 05/30/2015   Procedure: [place];  Surgeon: Princess Bruins, MD;  Location: [facility];  Service: Gynecology;  Laterality: N/A;   DILATION AND CURETTAGE OF UTERUS     Sterotactix needle biopsy  2000   breast calcification   Family History  Problem Relation Age of Onset   Hypothyroidism Mother    GER disease Mother    Osteoporosis Mother    Hypertension Mother    Cancer Mother        hodgkins lympoma, uterine cancer   Arrhythmia Mother    Hyperlipidemia Father    Hypertension Father    Diabetes Father    Coronary artery disease Father    Alzheimer's disease Brother    Coronary artery disease Maternal Grandfather    COPD Neg Hx    Esophageal cancer Neg Hx    Rectal cancer Neg Hx    Stomach cancer Neg Hx    Social History   Socioeconomic History   Marital status: Married    Spouse name: howard   Number of children: 2   Years of education: 16   Highest education level: Not on file  Occupational History   Occupation: PROPERTY M[employer]    Employer: [employer]  Tobacco Use   Smoking status: Never   Smokeless tobacco: Never  Vaping Use   Vaping Use: Never used  Substance and Sexual Activity   Alcohol use:  Yes    Comment: 1 a month   Drug use: No   Sexual activity: Yes    Partners: Male    Birth control/protection: Post-menopausal    Comment: 1st intercourse- 44, partners- less than 5  Other Topics Concern   Not on file  Social History Narrative   HSG, [place], Married '78,  1 son '81, 1 daughter '84, 1 grandson '12.    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 ACP - discussed with patient.    Refer - [email or web address]   Highest level of education:  BS   Right handed   Two story home   Social Determinants of Health  Financial Resource Strain: Low Risk  (09/29/2022)   Overall Financial Resource Strain (CARDIA)    Difficulty of Paying Living Expenses: Not hard at all  Food Insecurity: No Food Insecurity (09/29/2022)   Hunger Vital Sign    Worried About Running Out of Food in the Last Year: Never true    Ran Out of Food in the Last Year: Never true  Transportation Needs: No Transportation Needs (09/29/2022)   PRAPARE - Hydrologist (Medical): No    Lack of Transportation (Non-Medical): No  Physical Activity: Insufficiently Active (09/29/2022)   Exercise Vital Sign    Days of Exercise per Week: 5 days    Minutes of Exercise per Session: 10 min  Stress: No Stress Concern Present (09/29/2022)   [place]    Feeling of Stress : Only a little  Social Connections: Unknown (09/29/2022)   Social Connection and Isolation Panel [NHANES]    Frequency of Communication with Friends and Family: More than three times a week    Frequency of Social Gatherings with Friends and Family: Once a week    Attends Religious Services: Not on Advertising copywriter or Organizations: Yes    Attends Music therapist: More than 4 times per year    Marital Status: Married    Tobacco Counseling Counseling given: Not Answered   Clinical  Intake:  Pre-visit preparation completed: Yes  Pain : No/denies pain Pain Score: 0-No pain     BMI - recorded: 23.73 Nutritional Status: BMI of 19-24  Normal Nutritional Risks: None Diabetes: No  How often do you need to have someone help you when you read instructions, pamphlets, or other written materials from your doctor or pharmacy?: 1 - Never What is the last grade level you completed in school?: [employer]  Diabetic? No  Interpreter Needed?: No  Information entered by :: Lisette Abu, LPN.   Activities of Daily Living    09/29/2022   11:00 AM 09/29/2022    8:04 AM  In your present state of health, do you have any difficulty performing the following activities:  Hearing? 0 0  Vision? 0 0  Difficulty concentrating or making decisions? 0 0  Walking or climbing stairs? 0 0  Dressing or bathing? 0 0  Doing errands, shopping? 0 0  Preparing Food and eating ? N N  Using the Toilet? N N  In the past six months, have you accidently leaked urine? Y Y  Do you have problems with loss of bowel control? N N  Managing your Medications? N N  Managing your Finances? N N  Housekeeping or managing your Housekeeping? N N    Patient Care Team: Hoyt Koch, MD as PCP - General (Internal Medicine) Sherren Mocha, MD as PCP - Cardiology (Cardiology) Constance Haw, MD as PCP - Electrophysiology (Cardiology) Sylvan Cheese, NP as Nurse Practitioner (Nurse Practitioner) Princess Bruins, MD as Consulting Physician (Obstetrics and Gynecology) Alda Berthold, DO as Consulting Physician (Neurology) [facility] as Consulting Physician (Ophthalmology)  Indicate any recent Medical Services you may have received from other than [facility] providers in the past year (date may be approximate).     Assessment:   This is a routine wellness examination for [place].  Hearing/Vision screen Hearing Screening - Comments:: Denies hearing  difficulties   Vision Screening - Comments:: Wears rx glasses - up to date with routine eye  exams with [facility]    Dietary issues and exercise activities discussed: Current Exercise Habits: Home exercise routine, Type of exercise: walking, Time (Minutes): 10, Frequency (Times/Week): 5, Weekly Exercise (Minutes/Week): 50, Intensity: Moderate, Exercise limited by: respiratory conditions(s)   Goals Addressed             This Visit's Progress    Client understands the importance of follow-up with providers by attending scheduled visits        Depression Screen    09/29/2022   10:58 AM 07/16/2022    2:57 PM 03/12/2022   10:07 AM 10/17/2021    8:12 AM 09/24/2021    1:03 PM 09/09/2021   11:50 AM 09/04/2020    8:02 AM  PHQ 2/9 Scores  PHQ - 2 Score 0 0 0 0 0 0 0  PHQ- 9 Score   0      Exception Documentation       Other- indicate reason in comment box  Not completed       Patient recently lost a close friend.    Fall Risk    09/29/2022   11:00 AM 09/29/2022    8:04 AM 07/16/2022    2:57 PM 07/04/2022    8:39 AM 10/17/2021    8:12 AM  Fall Risk   Falls in the past year? 0 0 0 0 0  Number falls in past yr: 0 0 0 0 0  Injury with Fall? 0 0 0 0 0  Risk for fall due to : No Fall Risks  No Fall Risks  No Fall Risks  Follow up Falls prevention discussed  Falls evaluation completed Falls evaluation completed Falls evaluation completed    [place]:  Any stairs in or around the home? Yes  If so, are there any without handrails? No  Home free of loose throw rugs in walkways, pet beds, electrical cords, etc? Yes  Adequate lighting in your home to reduce risk of falls? Yes   ASSISTIVE DEVICES UTILIZED TO PREVENT FALLS:  Life alert? No  Use of a cane, walker or w/c? No  Grab bars in the bathroom? No  Shower chair or bench in shower? No  Elevated toilet seat or a handicapped toilet? No   TIMED UP AND GO:  Was the test performed? No .  Telephonic Visit   Cognitive Function:        09/29/2022   10:49 AM 09/24/2021    1:39 PM  6CIT Screen  What Year? 0 points 0 points  What month? 0 points 0 points  What time? 0 points 0 points  Count back from 20 0 points 0 points  Months in reverse 0 points 0 points  Repeat phrase 0 points 0 points  Total Score 0 points 0 points    Immunizations Immunization History  Administered Date(s) Administered   Fluad Quad(high Dose 65+) 05/09/2019, 05/29/2020, 02/28/2021, 03/12/2022   Influenza, High Dose Seasonal PF 06/02/2017, 03/31/2018   Influenza,inj,Quad PF,6+ Mos 02/27/2016   Moderna Covid-19 Vaccine Bivalent Booster 30yrs & up 05/21/2021   Moderna Sars-Covid-2 Vaccination 08/05/2019, 09/07/2019, 05/16/2020   Pneumococcal Conjugate-13 04/07/2018   Pneumococcal Polysaccharide-23 05/09/2019   Td 07/13/2002   Tdap 03/30/2013   Zoster Recombinat (Shingrix) 02/02/2022, 08/13/2022   Zoster, Live 07/20/2014    TDAP status: Up to date  Flu Vaccine status: Up to date  Pneumococcal vaccine status: Up to date  Covid-19 vaccine status: Completed vaccines  Qualifies for Shingles Vaccine? Yes  Zostavax completed Yes   Shingrix Completed?: Yes  Screening Tests Health Maintenance  Topic Date Due   COVID-19 Vaccine (5 - 2023-24 season) 02/28/2022   INFLUENZA VACCINE  01/29/2023   DTaP/Tdap/Td (3 - Td or Tdap) 03/31/2023   COLONOSCOPY (Pts 45-70yrs Insurance coverage will need to be confirmed)  09/06/2023   Medicare Annual Wellness (AWV)  09/29/2023   MAMMOGRAM  03/28/2024   Pneumonia Vaccine 67+ Years old  Completed   DEXA SCAN  Completed   Hepatitis C Screening  Completed   Zoster Vaccines- Shingrix  Completed   HPV VACCINES  Aged Out    Health Maintenance  Health Maintenance Due  Topic Date Due   COVID-19 Vaccine (5 - 2023-24 season) 02/28/2022    Colorectal cancer screening: Type of screening: Colonoscopy. Completed 09/05/2013. Repeat every 10 years  Mammogram  status: Completed 03/28/2022. Repeat every year  Bone Density status: Completed 07/15/2021. Results reflect: Bone density results: OSTEOPENIA. Repeat every 2 years.  Lung Cancer Screening: (Low Dose CT Chest recommended if Age 58-80 years, 30 pack-year currently smoking OR have quit w/in 15years.) does not qualify.   Lung Cancer Screening Referral: no   Additional Screening:  Hepatitis C Screening: does qualify; Completed 02/27/2016  Vision Screening: Recommended annual ophthalmology exams for early detection of glaucoma and other disorders of the eye. Is the patient up to date with their annual eye exam?  Yes  Who is the provider or what is the name of the office in which the patient attends annual eye exams? [facility] If pt is not established with a provider, would they like to be referred to a provider to establish care? No .   Dental Screening: Recommended annual dental exams for proper oral hygiene  Community Resource Referral / Chronic Care Management: CRR required this visit?  No   CCM required this visit?  No      Plan:     I have personally reviewed and noted the following in the patient's chart:   Medical and social history Use of alcohol, tobacco or illicit drugs  Current medications and supplements including opioid prescriptions. Patient is not currently taking opioid prescriptions. Functional ability and status Nutritional status Physical activity Advanced directives List of other physicians Hospitalizations, surgeries, and ER visits in previous 12 months Vitals Screenings to include cognitive, depression, and falls Referrals and appointments  In addition, I have reviewed and discussed with patient certain preventive protocols, quality metrics, and best practice recommendations. A written personalized care plan for preventive services as well as general preventive health recommendations were provided to patient.     Sheral Flow,  LPN   [id number]   Nurse Notes:  Normal cognitive status assessed by direct observation by this Nurse Health Advisor. No abnormalities found.   Patient Medicare AWV questionnaire was completed by the patient on 09/29/2022; I have confirmed that all information answered by patient is correct and no changes since this date.

## 2022-09-29 NOTE — Patient Instructions (Addendum)
Crystal [name] , Thank you for taking time to come for your Medicare Wellness Visit. I appreciate your ongoing commitment to your health goals. Please review the following plan we discussed and let me know if I can assist you in the future.   These are the goals we discussed:  Goals      Client understands the importance of follow-up with providers by attending scheduled visits        This is a list of the screening recommended for you and due dates:  Health Maintenance  Topic Date Due   COVID-19 Vaccine (5 - 2023-24 season) 02/28/2022   Flu Shot  01/29/2023   DTaP/Tdap/Td vaccine (3 - Td or Tdap) 03/31/2023   Colon Cancer Screening  09/06/2023   Medicare Annual Wellness Visit  09/29/2023   Mammogram  03/28/2024   Pneumonia Vaccine  Completed   DEXA scan (bone density measurement)  Completed   Hepatitis C Screening: USPSTF Recommendation to screen - Ages 38-79 yo.  Completed   Zoster (Shingles) Vaccine  Completed   HPV Vaccine  Aged Out    Advanced directives: Yes  Conditions/risks identified: Yes  Next appointment: Follow up in one year for your annual wellness visit.   Preventive Care 38 Years and Older, Female Preventive care refers to lifestyle choices and visits with your health care provider that can promote health and wellness. What does preventive care include? A yearly physical exam. This is also called an annual well check. Dental exams once or twice a year. Routine eye exams. Ask your health care provider how often you should have your eyes checked. Personal lifestyle choices, including: Daily care of your teeth and gums. Regular physical activity. Eating a healthy diet. Avoiding tobacco and drug use. Limiting alcohol use. Practicing safe sex. Taking low-dose aspirin every day. Taking vitamin and mineral supplements as recommended by your health care provider. What happens during an annual well check? The services and screenings done by your health care  provider during your annual well check will depend on your age, overall health, lifestyle risk factors, and family history of disease. Counseling  Your health care provider may ask you questions about your: Alcohol use. Tobacco use. Drug use. Emotional well-being. Home and relationship well-being. Sexual activity. Eating habits. History of falls. Memory and ability to understand (cognition). Work and work Statistician. Reproductive health. Screening  You may have the following tests or measurements: Height, weight, and BMI. Blood pressure. Lipid and cholesterol levels. These may be checked every 5 years, or more frequently if you are over 58 years old. Skin check. Lung cancer screening. You may have this screening every year starting at age 19 if you have a 30-pack-year history of smoking and currently smoke or have quit within the past 15 years. Fecal occult blood test (FOBT) of the stool. You may have this test every year starting at age 3. Flexible sigmoidoscopy or colonoscopy. You may have a sigmoidoscopy every 5 years or a colonoscopy every 10 years starting at age 20. Hepatitis C blood test. Hepatitis B blood test. Sexually transmitted disease (STD) testing. Diabetes screening. This is done by checking your blood sugar (glucose) after you have not eaten for a while (fasting). You may have this done every 1-3 years. Bone density scan. This is done to screen for osteoporosis. You may have this done starting at age 28. Mammogram. This may be done every 1-2 years. Talk to your health care provider about how often you should have regular mammograms. Talk  with your health care provider about your test results, treatment options, and if necessary, the need for more tests. Vaccines  Your health care provider may recommend certain vaccines, such as: Influenza vaccine. This is recommended every year. Tetanus, diphtheria, and acellular pertussis (Tdap, Td) vaccine. You may need a Td  booster every 10 years. Zoster vaccine. You may need this after age 38. Pneumococcal 13-valent conjugate (PCV13) vaccine. One dose is recommended after age 41. Pneumococcal polysaccharide (PPSV23) vaccine. One dose is recommended after age 32. Talk to your health care provider about which screenings and vaccines you need and how often you need them. This information is not intended to replace advice given to you by your health care provider. Make sure you discuss any questions you have with your health care provider. Document Released: 07/13/2015 Document Revised: 03/05/2016 Document Reviewed: 04/17/2015 Elsevier Interactive Patient Education  2017 [employer] Prevention in the Home Falls can cause injuries. They can happen to people of all ages. There are many things you can do to make your home safe and to help prevent falls. What can I do on the outside of my home? Regularly fix the edges of walkways and driveways and fix any cracks. Remove anything that might make you trip as you walk through a door, such as a raised step or threshold. Trim any bushes or trees on the path to your home. Use bright outdoor lighting. Clear any walking paths of anything that might make someone trip, such as rocks or tools. Regularly check to see if handrails are loose or broken. Make sure that both sides of any steps have handrails. Any raised decks and porches should have guardrails on the edges. Have any leaves, snow, or ice cleared regularly. Use sand or salt on walking paths during winter. Clean up any spills in your garage right away. This includes oil or grease spills. What can I do in the bathroom? Use night lights. Install grab bars by the toilet and in the tub and shower. Do not use towel bars as grab bars. Use non-skid mats or decals in the tub or shower. If you need to sit down in the shower, use a plastic, non-slip stool. Keep the floor dry. Clean up any water that spills on the floor  as soon as it happens. Remove soap buildup in the tub or shower regularly. Attach bath mats securely with double-sided non-slip rug tape. Do not have throw rugs and other things on the floor that can make you trip. What can I do in the bedroom? Use night lights. Make sure that you have a light by your bed that is easy to reach. Do not use any sheets or blankets that are too big for your bed. They should not hang down onto the floor. Have a firm chair that has side arms. You can use this for support while you get dressed. Do not have throw rugs and other things on the floor that can make you trip. What can I do in the kitchen? Clean up any spills right away. Avoid walking on wet floors. Keep items that you use a lot in easy-to-reach places. If you need to reach something above you, use a strong step stool that has a grab bar. Keep electrical cords out of the way. Do not use floor polish or wax that makes floors slippery. If you must use wax, use non-skid floor wax. Do not have throw rugs and other things on the floor that can make you  trip. What can I do with my stairs? Do not leave any items on the stairs. Make sure that there are handrails on both sides of the stairs and use them. Fix handrails that are broken or loose. Make sure that handrails are as long as the stairways. Check any carpeting to make sure that it is firmly attached to the stairs. Fix any carpet that is loose or worn. Avoid having throw rugs at the top or bottom of the stairs. If you do have throw rugs, attach them to the floor with carpet tape. Make sure that you have a light switch at the top of the stairs and the bottom of the stairs. If you do not have them, ask someone to add them for you. What else can I do to help prevent falls? Wear shoes that: Do not have high heels. Have rubber bottoms. Are comfortable and fit you well. Are closed at the toe. Do not wear sandals. If you use a stepladder: Make sure that it is  fully opened. Do not climb a closed stepladder. Make sure that both sides of the stepladder are locked into place. Ask someone to hold it for you, if possible. Clearly mark and make sure that you can see: Any grab bars or handrails. First and last steps. Where the edge of each step is. Use tools that help you move around (mobility aids) if they are needed. These include: Canes. Walkers. Scooters. Crutches. Turn on the lights when you go into a dark area. Replace any light bulbs as soon as they burn out. Set up your furniture so you have a clear path. Avoid moving your furniture around. If any of your floors are uneven, fix them. If there are any pets around you, be aware of where they are. Review your medicines with your doctor. Some medicines can make you feel dizzy. This can increase your chance of falling. Ask your doctor what other things that you can do to help prevent falls. This information is not intended to replace advice given to you by your health care provider. Make sure you discuss any questions you have with your health care provider. Document Released: 04/12/2009 Document Revised: 11/22/2015 Document Reviewed: 07/21/2014 Elsevier Interactive Patient Education  2017 [employer].

## 2022-10-06 ENCOUNTER — Encounter: Payer: Self-pay | Admitting: Cardiovascular Disease

## 2022-10-06 ENCOUNTER — Ambulatory Visit: Payer: Medicare Other | Attending: Cardiovascular Disease | Admitting: Cardiovascular Disease

## 2022-10-06 VITALS — BP 110/92 | HR 59 | Ht 69.5 in | Wt 163.2 lb

## 2022-10-06 DIAGNOSIS — I480 Paroxysmal atrial fibrillation: Secondary | ICD-10-CM | POA: Diagnosis present

## 2022-10-06 NOTE — Patient Instructions (Signed)
Medication Instructions:  Your physician recommends that you continue on your current medications as directed. Please refer to the Current Medication list given to you today.  *If you need a refill on your cardiac medications before your next appointment, please call your pharmacy*   Lab Work: NONE If you have labs (blood work) drawn today and your tests are completely normal, you will receive your results only by: MyChart Message (if you have MyChart) OR A paper copy in the mail If you have any lab test that is abnormal or we need to change your treatment, we will call you to review the results.   Testing/Procedures: NONE   Follow-Up: At [facility], you and your health needs are our priority.  As part of our continuing mission to provide you with exceptional heart care, we have created designated Provider Care Teams.  These Care Teams include your primary Cardiologist (physician) and Advanced Practice Providers (APPs -  Physician Assistants and Nurse Practitioners) who all work together to provide you with the care you need, when you need it.  We recommend signing up for the patient portal called "MyChart".  Sign up information is provided on this After Visit Summary.  MyChart is used to connect with patients for Virtual Visits (Telemedicine).  Patients are able to view lab/test results, encounter notes, upcoming appointments, etc.  Non-urgent messages can be sent to your provider as well.   To learn more about what you can do with MyChart, go to [email or web address].    Your next appointment:   1 year(s)  Provider:   Michael Cooper, MD      

## 2022-10-06 NOTE — Progress Notes (Signed)
Cardiology Office Note:    Date:  10/06/2022   ID:  Crystal [name], DOB Jul 19, 1950, MRN [id number]  PCP:  Myrlene Broker, MD   [facility] Providers Cardiologist:  Tonny Bollman, MD Electrophysiologist:  Will Jorja Loa, MD     Referring MD: Myrlene Broker, *   Chief Complaint  Patient presents with   Atrial Fibrillation    History of Present Illness:    Crystal [name] is a 72 y.o. female presenting for follow-up of paroxysmal atrial fibrillation with a CHADS2 Vasc Score of 2.  She is anticoagulated with apixaban.  The patient has undergone A-fib ablation in 2021.  She had some recurrence of atrial fibrillation and was started on flecainide.  She recently saw Dr. Elberta Fortis who offered to increase her flecainide because she was having some increase in atrial fibrillation.  She decided to continue to monitor symptoms on her current dose of flecainide.  The patient is here with her husband today.  She has not had any other symptoms of heart palpitations since her visit with Dr. Elberta Fortis.  She has some increase in fatigue but no other specific symptoms.  Denies chest pain, chest pressure, shortness of breath, or leg swelling.  No lightheadedness or syncope.  Remains compliant with her medications.  States that when she has A-fib she feels weak and notices an irregular heartbeat and a funny feeling in her chest.  Past Medical History:  Diagnosis Date   BACK PAIN, LUMBAR    Breast cancer of lower-outer quadrant of right female breast 10/24/2014   Breast cancer, right breast    FIBROADENOMA, BREAST 2006   HPV in female    Hyperthyroidism    New onset atrial fibrillation 01/06/2020   Palpitations 01/06/2020   PARESTHESIA    toes   PONV (postoperative nausea and vomiting)    Radiation 11/23/14-12/25/14   right breast 42.72 gray, lumpectomy cavity boosted to 54.72 gray   Routine health maintenance 03/30/2013   Colonoscopy - for colonoscopy (Sept '14)   Immunization  Tdap Oct '14    Spider veins of both lower extremities 05/09/2019   Wears glasses     Past Surgical History:  Procedure Laterality Date   ATRIAL FIBRILLATION ABLATION N/A 05/25/2020   Procedure: ATRIAL FIBRILLATION ABLATION;  Surgeon: Regan Lemming, MD;  Location: [facility];  Service: Cardiovascular;  Laterality: N/A;   atrophic vaginitis     BREAST LUMPECTOMY WITH RADIOACTIVE SEED LOCALIZATION Right 10/18/2014   Procedure: BREAST LUMPECTOMY WITH RADIOACTIVE SEED LOCALIZATION;  Surgeon: Abigail Miyamoto, MD;  Location: [facility];  Service: General;  Laterality: Right;   COLONOSCOPY  09/05/2013   D & C and hysteroscopy  11/1998   for uterine polyp   DILATATION & CURRETTAGE/HYSTEROSCOPY WITH RESECTOCOPE N/A 05/30/2015   Procedure: DILATATION & CURETTAGE/HYSTEROSCOPY WITH RESECTOCOPE;  Surgeon: Genia Del, MD;  Location: [facility];  Service: Gynecology;  Laterality: N/A;   DILATION AND CURETTAGE OF UTERUS     Sterotactix needle biopsy  2000   breast calcification    Current Medications: Current Meds  Medication Sig   acetaminophen (TYLENOL) 325 MG tablet Take 325 mg by mouth as needed for moderate pain or headache.   Calcium Carbonate-Vitamin D 600-400 MG-UNIT tablet Take 1 tablet by mouth daily.   cetirizine (ZYRTEC) 10 MG tablet Take 10 mg by mouth daily.   Cholecalciferol (VITAMIN D3) 1000 UNITS CAPS Take 1,000 Units by mouth daily.   ELIQUIS 5 MG TABS tablet  TAKE ONE TABLET BY MOUTH TWICE DAILY   flecainide (TAMBOCOR) 50 MG tablet TAKE ONE TABLET BY MOUTH TWICE DAILY   methimazole (TAPAZOLE) 5 MG tablet Take 1 tablet (5 mg total) by mouth every other day.   metoprolol succinate (TOPROL-XL) 50 MG 24 hr tablet Take 1 tablet (50 mg total) by mouth daily. Take with or immediately following a meal.   Multiple Vitamin (MULTIVITAMIN) tablet Take 1 tablet by mouth daily.   Polyethyl Glycol-Propyl Glycol (SYSTANE OP) Place 1 drop into both eyes  daily as needed (For dryness.).    Probiotic Product (FORTIFY DAILY PROBIOTIC PO) Take 1 capsule by mouth daily.    TYRVAYA 0.03 MG/ACT SOLN Place 1 spray into both nostrils 2 (two) times daily.   XIIDRA 5 % SOLN Apply 1 drop to eye 2 (two) times daily.     Allergies:   Patient has no known allergies.   Social History   Socioeconomic History   Marital status: Married    Spouse name: howard   Number of children: 2   Years of education: 16   Highest education level: Not on file  Occupational History   Occupation: PROPERTY M[employer]    Employer: [employer]  Tobacco Use   Smoking status: Never   Smokeless tobacco: Never  Vaping Use   Vaping Use: Never used  Substance and Sexual Activity   Alcohol use: Yes    Comment: 1 a month   Drug use: No   Sexual activity: Yes    Partners: Male    Birth control/protection: Post-menopausal    Comment: 1st intercourse- 21, partners- less than 5  Other Topics Concern   Not on file  Social History Narrative   HSG, [employer] [place], Married '78,  1 son '81, 1 daughter '84, 1 grandson '12.    Work - Geneticist, molecular.  Lives with husband. ACP - discussed with patient.    Refer - [email or web address]   Highest level of education:  BS   Right handed   Two story home   Social Determinants of Health   Financial Resource Strain: Low Risk  (09/29/2022)   Overall Financial Resource Strain (CARDIA)    Difficulty of Paying Living Expenses: Not hard at all  Food Insecurity: No Food Insecurity (09/29/2022)   Hunger Vital Sign    Worried About Running Out of Food in the Last Year: Never true    Ran Out of Food in the Last Year: Never true  Transportation Needs: No Transportation Needs (09/29/2022)   PRAPARE - Administrator, Civil Service (Medical): No    Lack of Transportation (Non-Medical): No  Physical Activity: Insufficiently Active (09/29/2022)   Exercise Vital Sign    Days of Exercise per Week: 5 days     Minutes of Exercise per Session: 10 min  Stress: No Stress Concern Present (09/29/2022)   [employer] of Occupational Health - Occupational Stress Questionnaire    Feeling of Stress : Only a little  Social Connections: Unknown (09/29/2022)   Social Connection and Isolation Panel [NHANES]    Frequency of Communication with Friends and Family: More than three times a week    Frequency of Social Gatherings with Friends and Family: Once a week    Attends Religious Services: Not on Marketing executive or Organizations: Yes    Attends Banker Meetings: More than 4 times per year    Marital Status: Married  Family History: The patient's family history includes Alzheimer's disease in her brother; Arrhythmia in her mother; Cancer in her mother; Coronary artery disease in her father and maternal grandfather; Diabetes in her father; GER disease in her mother; Hyperlipidemia in her father; Hypertension in her father and mother; Hypothyroidism in her mother; Osteoporosis in her mother. There is no history of COPD, Esophageal cancer, Rectal cancer, or Stomach cancer.  ROS:   Please see the history of present illness.    All other systems reviewed and are negative.  EKGs/Labs/Other Studies Reviewed:    The following studies were reviewed today: Cardiac Studies & Procedures       ECHOCARDIOGRAM  ECHOCARDIOGRAM COMPLETE 01/31/2020  Narrative ECHOCARDIOGRAM REPORT    Patient Name:   Fernande BoydenMELINDA B Secrest Date of Exam: 01/31/2020 Medical Rec #:  [id number]          Height:       70.0 in Accession #:    [id number]         Weight:       156.8 lb Date of Birth:  03/19/1951           BSA:          1.882 m Patient Age:    69 years           BP:           128/80 mmHg Patient Gender: F                  HR:           64 bpm. Exam Location:  [address]  Procedure: 2D Echo, 3D Echo and Cardiac Doppler  Indications:    I48.92 Atrial Fibrlation  History:        Patient has  no prior history of Echocardiogram examinations. Arrythmias:Atrial Fibrillation; Risk Factors:Family History of Coronary Artery Disease. History of Right BReast Cancer (lumpectomy and Radiation- 2016).  Sonographer:    Farrel ConnersBethany Mcmahill RDCS Referring Phys: [phone] Nathaniel Yaden  IMPRESSIONS   1. Left ventricular ejection fraction, by estimation, is 60 to 65%. The left ventricle has normal function. The left ventricle has no regional wall motion abnormalities. Left ventricular diastolic parameters were normal. 2. Right ventricular systolic function is normal. The right ventricular size is normal. There is normal pulmonary artery systolic pressure. 3. The mitral valve is normal in structure. Trivial mitral valve regurgitation. No evidence of mitral stenosis. 4. The aortic valve is tricuspid. Aortic valve regurgitation is trivial. No aortic stenosis is present. 5. The inferior vena cava is normal in size with greater than 50% respiratory variability, suggesting right atrial pressure of 3 mmHg.  FINDINGS Left Ventricle: Left ventricular ejection fraction, by estimation, is 60 to 65%. The left ventricle has normal function. The left ventricle has no regional wall motion abnormalities. The left ventricular internal cavity size was normal in size. There is no left ventricular hypertrophy. Left ventricular diastolic parameters were normal.  Right Ventricle: The right ventricular size is normal.Right ventricular systolic function is normal. There is normal pulmonary artery systolic pressure. The tricuspid regurgitant velocity is 2.61 m/s, and with an assumed right atrial pressure of 3 mmHg, the estimated right ventricular systolic pressure is 30.2 mmHg.  Left Atrium: Left atrial size was normal in size.  Right Atrium: Right atrial size was normal in size.  Pericardium: There is no evidence of pericardial effusion.  Mitral Valve: The mitral valve is normal in structure. Normal mobility of the  mitral valve leaflets. Trivial  mitral valve regurgitation. No evidence of mitral valve stenosis.  Tricuspid Valve: The tricuspid valve is normal in structure. Tricuspid valve regurgitation is mild . No evidence of tricuspid stenosis.  Aortic Valve: The aortic valve is tricuspid. Aortic valve regurgitation is trivial. No aortic stenosis is present.  Pulmonic Valve: The pulmonic valve was normal in structure. Pulmonic valve regurgitation is trivial. No evidence of pulmonic stenosis.  Aorta: The aortic root is normal in size and structure.  Venous: The inferior vena cava is normal in size with greater than 50% respiratory variability, suggesting right atrial pressure of 3 mmHg.  IAS/Shunts: No atrial level shunt detected by color flow Doppler.   LEFT VENTRICLE PLAX 2D LVIDd:         5.00 cm  Diastology LVIDs:         2.80 cm  LV e' lateral:   10.10 cm/s LV PW:         0.85 cm  LV E/e' lateral: 7.6 LV IVS:        0.65 cm  LV e' medial:    7.94 cm/s LVOT diam:     2.40 cm  LV E/e' medial:  9.7 LV SV:         121 LV SV Index:   64 LVOT Area:     4.52 cm  3D Volume EF: 3D EF:        71 % LV EDV:       123 ml LV ESV:       35 ml LV SV:        88 ml  RIGHT VENTRICLE RV S prime:     17.80 cm/s TAPSE (M-mode): 2.4 cm  LEFT ATRIUM             Index       RIGHT ATRIUM           Index LA diam:        3.90 cm 2.07 cm/m  RA Area:     13.40 cm LA Vol (A2C):   66.0 ml 35.06 ml/m RA Volume:   34.30 ml  18.22 ml/m LA Vol (A4C):   50.9 ml 27.04 ml/m LA Biplane Vol: 60.6 ml 32.20 ml/m AORTIC VALVE LVOT Vmax:   103.00 cm/s LVOT Vmean:  70.400 cm/s LVOT VTI:    0.267 m  AORTA Ao Root diam: 3.10 cm Ao Asc diam:  3.70 cm  MITRAL VALVE               TRICUSPID VALVE MV Area (PHT): cm         TR Peak grad:   27.2 mmHg MV Decel Time: 310 msec    TR Vmax:        261.00 cm/s MV E velocity: 76.90 cm/s MV A velocity: 60.20 cm/s  SHUNTS MV E/A ratio:  1.28        Systemic VTI:  0.27  m Systemic Diam: 2.40 cm  [name] MD Electronically signed by [name] MD Signature Date/Time: 01/31/2020/11:48:31 AM    Final    MONITORS  LONG TERM MONITOR (3-14 DAYS) 02/21/2020  Narrative 1. The basic rhythm is normal sinus with an average HR of 71 bpm 2. Atrial fibrillation with rapid ventricular rate occurs with a 5% burden 3. No high-grade heart block or pathologic pauses 4. No bradycardic events 5. No significant ventricular ectopy            EKG:  EKG is not ordered today.    Recent  Labs: 09/19/2022: BUN 19; Creatinine, Ser 1.03; Hemoglobin 12.1; Platelets 252; Potassium 4.5; Sodium 143 09/22/2022: TSH 0.99  Recent Lipid Panel    Component Value Date/Time   CHOL 196 09/24/2021 1434   TRIG 92.0 09/24/2021 1434   HDL 86.00 09/24/2021 1434   CHOLHDL 2 09/24/2021 1434   VLDL 18.4 09/24/2021 1434   LDLCALC 91 09/24/2021 1434     Risk Assessment/Calculations:    CHA2DS2-VASc Score = 2   This indicates a 2.2% annual risk of stroke. The patient's score is based upon: CHF History: 0 HTN History: 0 Diabetes History: 0 Stroke History: 0 Vascular Disease History: 0 Age Score: 1 Gender Score: 1           Physical Exam:    VS:  BP (!) 110/92   Pulse (!) 59   Ht 5' 9.5" (1.765 m)   Wt 163 lb 3.2 oz (74 kg)   SpO2 99%   BMI 23.75 kg/m     Wt Readings from Last 3 Encounters:  10/06/22 163 lb 3.2 oz (74 kg)  09/29/22 163 lb (73.9 kg)  09/22/22 163 lb (73.9 kg)     GEN:  Well nourished, well developed in no acute distress HEENT: Normal NECK: No JVD; No carotid bruits LYMPHATICS: No lymphadenopathy CARDIAC: RRR, no murmurs, rubs, gallops RESPIRATORY:  Clear to auscultation without rales, wheezing or rhonchi  ABDOMEN: Soft, non-tender, non-distended MUSCULOSKELETAL:  No edema; No deformity  SKIN: Warm and dry NEUROLOGIC:  Alert and oriented x 3 PSYCHIATRIC:  Normal affect   ASSESSMENT:    1. Paroxysmal atrial fibrillation    PLAN:     In order of problems listed above:  The patient has occasional episodes of symptomatic atrial fibrillation.  She will remain on her current program which includes apixaban for anticoagulation, and flecainide/metoprolol succinate for rhythm control.  Advised her that if she starts to feel more frequent episodic symptoms with atrial fibrillation, she will increase flecainide to 100 mg twice daily as per Dr. Gershon Crane recommendations.  She will contact us before doing this and she understands that she would need a treadmill study for routine surveillance within a few weeks of increasing her flecainide dose.  As long as her symptoms remain limited and only occur every 3 to 4 months, she will continue her current medical regimen.           Medication Adjustments/Labs and Tests Ordered: Current medicines are reviewed at length with the patient today.  Concerns regarding medicines are outlined above.  No orders of the defined types were placed in this encounter.  No orders of the defined types were placed in this encounter.   Patient Instructions  Medication Instructions:  Your physician recommends that you continue on your current medications as directed. Please refer to the Current Medication list given to you today.  *If you need a refill on your cardiac medications before your next appointment, please call your pharmacy*   Lab Work: NONE If you have labs (blood work) drawn today and your tests are completely normal, you will receive your results only by: MyChart Message (if you have MyChart) OR A paper copy in the mail If you have any lab test that is abnormal or we need to change your treatment, we will call you to review the results.   Testing/Procedures: NONE   Follow-Up: At [facility], you and your health needs are our priority.  As part of our continuing mission to provide you with exceptional heart care,  we have created designated Provider Care Teams.  These  Care Teams include your primary Cardiologist (physician) and Advanced Practice Providers (APPs -  Physician Assistants and Nurse Practitioners) who all work together to provide you with the care you need, when you need it.  We recommend signing up for the patient portal called "MyChart".  Sign up information is provided on this After Visit Summary.  MyChart is used to connect with patients for Virtual Visits (Telemedicine).  Patients are able to view lab/test results, encounter notes, upcoming appointments, etc.  Non-urgent messages can be sent to your provider as well.   To learn more about what you can do with MyChart, go to [email or web address].    Your next appointment:   1 year(s)  Provider:   Tonny Bollman, MD        Signed, Tonny Bollman, MD  10/06/2022 12:53 PM    [facility]

## 2022-11-19 ENCOUNTER — Telehealth: Payer: Self-pay | Admitting: Cardiovascular Disease

## 2022-11-19 DIAGNOSIS — Z79899 Other long term (current) drug therapy: Secondary | ICD-10-CM

## 2022-11-19 DIAGNOSIS — I480 Paroxysmal atrial fibrillation: Secondary | ICD-10-CM

## 2022-11-19 NOTE — Telephone Encounter (Signed)
Patient states her afib is more frequent and listed the readings from her Apple watch. She denies CP but is sometimes SOB and feels tired. Patient denies any serious symptoms or concerns.  She is taking Eliquis. She states previously Dr Excell Seltzer had discussed increasing her flecainide and wanted to know if that is still the recommendation.  Advised I would forward her concerns to Dr Excell Seltzer for review.

## 2022-11-19 NOTE — Telephone Encounter (Signed)
Patient c/o Palpitations:  High priority if patient c/o lightheadedness, shortness of breath, or chest pain  How long have you had palpitations/irregular HR/ Afib? Are you having the symptoms now? Afib since today. She states she saw it on her watch   Are you currently experiencing lightheadedness, SOB or CP? "Cough because it kind of hard to get breath" "arms feel like sphaghetti"  Do you have a history of afib (atrial fibrillation) or irregular heart rhythm? No   Have you checked your BP or HR? (document readings if available): hr 47-171today   Are you experiencing any other symptoms? No

## 2022-11-25 MED ORDER — FLECAINIDE ACETATE 100 MG PO TABS: 100.0000 mg | ORAL_TABLET | Freq: Two times a day (BID) | ORAL | 6 refills | Status: DC

## 2022-11-25 NOTE — Telephone Encounter (Signed)
Pt started the increase this morning. Aware office will call to arrange GXT testing. Advised to keep August follow up with Dr Elberta Fortis. Patient verbalized understanding and agreeable to plan.

## 2022-11-25 NOTE — Telephone Encounter (Signed)
Spoke to the patient.  She is having increasing episodes of atrial fibrillation with associated symptoms.  Recommended that she increase flecainide to 100 mg twice daily after reviewing notes of Dr. Elberta Fortis and my recent office note.  Please schedule her for an exercise treadmill study in 1 to 2 weeks for surveillance after flecainide dose increase. thanks

## 2022-11-25 NOTE — Addendum Note (Signed)
Addended by: Baird Lyons on: 11/25/2022 03:04 PM   Modules accepted: Orders

## 2022-12-05 ENCOUNTER — Ambulatory Visit: Payer: Medicare Other | Attending: Cardiology

## 2022-12-05 DIAGNOSIS — Z79899 Other long term (current) drug therapy: Secondary | ICD-10-CM | POA: Diagnosis present

## 2022-12-05 DIAGNOSIS — I480 Paroxysmal atrial fibrillation: Secondary | ICD-10-CM | POA: Diagnosis present

## 2022-12-05 LAB — EXERCISE TOLERANCE TEST
Angina Index: 0
Estimated workload: 8.4
Exercise duration (min): 7 min
Exercise duration (sec): 41 s
MPHR: 149 {beats}/min
Peak HR: 100 {beats}/min
Percent HR: 67 %
RPE: 17
Rest HR: 59 {beats}/min

## 2023-01-28 ENCOUNTER — Ambulatory Visit (INDEPENDENT_AMBULATORY_CARE_PROVIDER_SITE_OTHER): Payer: Medicare Other | Admitting: Internal Medicine

## 2023-01-28 ENCOUNTER — Encounter: Payer: Self-pay | Admitting: Internal Medicine

## 2023-01-28 VITALS — BP 112/74 | HR 65 | Temp 98.0°F | Wt 162.0 lb

## 2023-01-28 DIAGNOSIS — M545 Low back pain, unspecified: Secondary | ICD-10-CM | POA: Diagnosis not present

## 2023-01-28 DIAGNOSIS — T148XXA Other injury of unspecified body region, initial encounter: Secondary | ICD-10-CM | POA: Diagnosis not present

## 2023-01-28 MED ORDER — TIZANIDINE HCL 2 MG PO TABS: 2.0000 mg | ORAL_TABLET | Freq: Every evening | ORAL | 0 refills | Status: DC | PRN

## 2023-01-28 NOTE — Patient Instructions (Addendum)
       Medications changes include :   tizanidine 2 mg bedtime as needed       Return if symptoms worsen or fail to improve.

## 2023-01-28 NOTE — Progress Notes (Signed)
Subjective:    Patient ID: Crystal [name], female    DOB: 1950-12-03, 72 y.o.   MRN: [id number]      HPI Crystal [name] is here for  Chief Complaint  Patient presents with   Tailbone Pain    Left sided sciatica pain     ? Pulled muscle - 4 days  ago - she was washing her car and she was in an awkward position for a while- then when she went to move she had significant pain in the left [name] back/buttock region.  She thinks she pulled a muscle -- it is getting better.  It is worse after sitting when she first gets up, but gets better as she moves around a little bit.   She has pain in the area and denies any radiating pain, spasms or numbness/tingling.  Tried heat/cold, tylenol     Medications and allergies reviewed with patient and updated if appropriate.  Current Outpatient Medications on File Prior to Visit  Medication Sig Dispense Refill   acetaminophen (TYLENOL) 325 MG tablet Take 325 mg by mouth as needed for moderate pain or headache.     Calcium Carbonate-Vitamin D 600-400 MG-UNIT tablet Take 1 tablet by mouth daily.     cetirizine (ZYRTEC) 10 MG tablet Take 10 mg by mouth daily.     Cholecalciferol (VITAMIN D3) 1000 UNITS CAPS Take 1,000 Units by mouth daily.     ELIQUIS 5 MG TABS tablet TAKE ONE TABLET BY MOUTH TWICE DAILY 180 tablet 1   flecainide (TAMBOCOR) 100 MG tablet Take 1 tablet (100 mg total) by mouth 2 (two) times daily. 60 tablet 6   methimazole (TAPAZOLE) 5 MG tablet Take 1 tablet (5 mg total) by mouth every other day. 45 tablet 3   metoprolol succinate (TOPROL-XL) 50 MG 24 hr tablet Take 1 tablet (50 mg total) by mouth daily. Take with or immediately following a meal. 90 tablet 3   Multiple Vitamin (MULTIVITAMIN) tablet Take 1 tablet by mouth daily.     Polyethyl Glycol-Propyl Glycol (SYSTANE OP) Place 1 drop into both eyes daily as needed (For dryness.).      Probiotic Product (FORTIFY DAILY PROBIOTIC PO) Take 1 capsule by mouth daily.      No current  facility-administered medications on file prior to visit.    Review of Systems     Objective:   Vitals:   01/28/23 1013  BP: 112/74  Pulse: 65  Temp: 98 F (36.7 C)  SpO2: 98%   BP Readings from Last 3 Encounters:  01/28/23 112/74  10/06/22 (!) 110/92  09/22/22 126/72   Wt Readings from Last 3 Encounters:  01/28/23 162 lb (73.5 kg)  10/06/22 163 lb 3.2 oz (74 kg)  09/29/22 163 lb (73.9 kg)   Body mass index is 23.58 kg/m.    Physical Exam Constitutional:      General: She is not in acute distress.    Appearance: Normal appearance. She is not ill-appearing.  HENT:     Head: Normocephalic and atraumatic.  Musculoskeletal:        General: No tenderness (No tenderness with palpation along lumbar spine, left [name] back, left buttock region and left SI joint).     Right [name] leg: No edema.     Left [name] leg: No edema.     Comments: Some pain with getting up from sitting position  Skin:    General: Skin is warm and dry.  Neurological:     Mental  Status: She is alert.            Assessment & Plan:   Muscle strain: Acute Occurred 4 days ago after being in an awkward position for a little while-had significant pain without radiation, spasm numbness/tingling Concern for strain/pulled muscle Has tried heat/cold and Tylenol There has been some improvement, but still having a good amount of pain Continue heat, Tylenol as needed-cannot take NSAIDs Can try topical medications, stretching, massage Trial tizanidine 2 mg at bedtime Advised her to call if symptoms or not continuing to improve so that we can revise treatment

## 2023-02-11 ENCOUNTER — Ambulatory Visit (INDEPENDENT_AMBULATORY_CARE_PROVIDER_SITE_OTHER): Payer: Medicare Other | Admitting: Adult Health

## 2023-02-11 VITALS — BP 136/82 | HR 62 | Temp 98.1°F | Ht 69.5 in | Wt 162.0 lb

## 2023-02-11 DIAGNOSIS — K5900 Constipation, unspecified: Secondary | ICD-10-CM

## 2023-02-11 DIAGNOSIS — M545 Low back pain, unspecified: Secondary | ICD-10-CM | POA: Diagnosis not present

## 2023-02-11 NOTE — Patient Instructions (Addendum)
I think your constipation is causing your back pain.   1/2 bottle of Magnesium Citrate mixed with seven up or sprite  You can also use warm prune juice and 2 table spoons of melted butter mixed together.   After that, use metamucil daily - but drink more water throughout the day

## 2023-02-11 NOTE — Progress Notes (Signed)
Subjective:    Patient ID: Crystal Crystal [name], female    DOB: 02/23/1951, 72 y.o.   MRN: [id number]  Constipation Associated symptoms include back pain.  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  She is a patient of Dr. Okey Dupre who I am seeing today for an acute issue. She was seen 2 weeks ago by another provider for suspected muscle strain in her low back. At this time she was washing her care and was in an awkward position for while and then went to move and had significant back pain in her left Crystal [name] back/buttock region. She was given a trial of Tizanidine 2 mg at bedtime. That pain had away. Over the last day or so she had developed left sided low back pain that is non radiating. She was doing yard work over the weekend and felt like this could be the cause but feels like this pain is different. She has had some nausea and vomiting from the pain. No UTI like symptoms. She also reports that her dose of Flecinide has been increased recently and since that time she has been constipated. She has not had much of a bowel movement in 5 days. She is passing gas. She has started taking Senokot 2 a day without relief.   She did try a muscle relaxer but did not get any relief from this.    Review of Systems  Gastrointestinal:  Positive for constipation.  Musculoskeletal:  Positive for back pain.   See HPI   Past Medical History:  Diagnosis Date   BACK PAIN, LUMBAR    Breast cancer of Crystal [name]-outer quadrant of right  female breast (HCC) 10/24/2014   Breast cancer, right breast    FIBROADENOMA, BREAST 2006   HPV in female    Hyperthyroidism    New onset atrial fibrillation (HCC) 01/06/2020   Palpitations 01/06/2020   PARESTHESIA    toes   PONV (postoperative nausea and vomiting)    Radiation 11/23/14-12/25/14   right breast 42.72 gray, lumpectomy cavity boosted to 54.72 gray   Routine health maintenance 03/30/2013   Colonoscopy - for colonoscopy (Sept '14)  Immunization  Tdap Oct '14    Spider veins of both Crystal [name] extremities 05/09/2019   Wears glasses     Social History   Socioeconomic History   Marital status: Married    Spouse name: Crystal [name]   Number of children: 2   Years of education: 16   Highest education level: Bachelor's degree (e.g., BA, AB, BS)  Occupational History   Occupation: PROPERTY M[employer]    Employer: [employer]  Tobacco Use   Smoking status: Never   Smokeless tobacco: Never  Vaping Use   Vaping status: Never Used  Substance and Sexual Activity   Alcohol use: Yes    Comment: 1 a month   Drug use: No   Sexual activity: Yes    Partners: Male    Birth control/protection: Post-menopausal    Comment: 1st intercourse- 21, partners- less than 5  Other Topics Concern   Not on file  Social History Narrative   HSG, [employer] - BA,  Married '78,  1 son '81, 1 daughter '84, 1 grandson '12.    Work - Geneticist, molecular.  Lives with husband. ACP - discussed with patient.    Refer - [email or web address]   Highest level of education:  BS   Right handed   Two story home   Social Determinants of Health   Financial Resource Strain: Low Risk  (02/11/2023)   Overall Financial Resource Strain (CARDIA)    Difficulty of Paying Living Expenses: Not hard at all  Food Insecurity: No Food Insecurity (02/11/2023)   Hunger Vital Sign    Worried About Running Out of Food in the Last Year: Never true    Ran Out of Food in the Last Year: Never true   Transportation Needs: No Transportation Needs (02/11/2023)   PRAPARE - Administrator, Civil Service (Medical): No    Lack of Transportation (Non-Medical): No  Physical Activity: Insufficiently Active (02/11/2023)   Exercise Vital Sign    Days of Exercise per Week: 2 days    Minutes of Exercise per Session: 20 min  Stress: No Stress Concern Present (02/11/2023)   [employer] of Occupational Health - Occupational Stress Questionnaire    Feeling of Stress : Not at all  Social Connections: Socially Integrated (02/11/2023)   Social Connection and Isolation Panel [NHANES]    Frequency of Communication with Friends and Family: More than three times a week    Frequency of Social Gatherings with Friends and Family: More than three times a week    Attends Religious Services: More than 4 times per year    Active Member of [employer] or Organizations: Yes    Attends Banker Meetings: More than 4 times per year    Marital Status: Married  Catering manager Violence: Not At Risk (09/29/2022)   Humiliation, Afraid, Rape, and Kick questionnaire    Fear of Current or Ex-Partner: No    Emotionally Abused: No    Physically Abused: No    Sexually Abused: No    Past Surgical History:  Procedure Laterality Date   ATRIAL FIBRILLATION ABLATION N/A 05/25/2020   Procedure: ATRIAL FIBRILLATION ABLATION;  Surgeon: Regan Lemming, MD;  Location: [facility];  Service: Cardiovascular;  Laterality: N/A;   atrophic vaginitis     BREAST LUMPECTOMY WITH RADIOACTIVE SEED LOCALIZATION Right 10/18/2014   Procedure: BREAST LUMPECTOMY WITH RADIOACTIVE SEED LOCALIZATION;  Surgeon: Abigail Miyamoto, MD;  Location: [facility];  Service: General;  Laterality: Right;   COLONOSCOPY  09/05/2013   D & C and hysteroscopy  11/1998   for uterine polyp   DILATATION & CURRETTAGE/HYSTEROSCOPY WITH RESECTOCOPE N/A 05/30/2015   Procedure: DILATATION & CURETTAGE/HYSTEROSCOPY WITH  RESECTOCOPE;  Surgeon: Genia Del, MD;  Location: [facility];  Service: Gynecology;  Laterality: N/A;   DILATION AND CURETTAGE OF UTERUS     Sterotactix needle biopsy  2000   breast calcification    Family History  Problem Relation Age of Onset   Hypothyroidism Mother    GER disease Mother    Osteoporosis Mother    Hypertension Mother    Cancer Mother        hodgkins lympoma, uterine cancer   Arrhythmia Mother    Hyperlipidemia Father    Hypertension Father    Diabetes Father    Coronary artery disease Father    Alzheimer's disease Brother    Coronary artery disease Maternal Grandfather    COPD Neg Hx    Esophageal cancer  Neg Hx    Rectal cancer Neg Hx    Stomach cancer Neg Hx     No Known Allergies  Current Outpatient Medications on File Prior to Visit  Medication Sig Dispense Refill   acetaminophen (TYLENOL) 325 MG tablet Take 325 mg by mouth as needed for moderate pain or headache.     Calcium Carbonate-Vitamin D 600-400 MG-UNIT tablet Take 1 tablet by mouth daily.     cetirizine (ZYRTEC) 10 MG tablet Take 10 mg by mouth daily.     Cholecalciferol (VITAMIN D3) 1000 UNITS CAPS Take 1,000 Units by mouth daily.     ELIQUIS 5 MG TABS tablet TAKE ONE TABLET BY MOUTH TWICE DAILY 180 tablet 1   flecainide (TAMBOCOR) 100 MG tablet Take 1 tablet (100 mg total) by mouth 2 (two) times daily. 60 tablet 6   methimazole (TAPAZOLE) 5 MG tablet Take 1 tablet (5 mg total) by mouth every other day. 45 tablet 3   metoprolol succinate (TOPROL-XL) 50 MG 24 hr tablet Take 1 tablet (50 mg total) by mouth daily. Take with or immediately following a meal. 90 tablet 3   Multiple Vitamin (MULTIVITAMIN) tablet Take 1 tablet by mouth daily.     Polyethyl Glycol-Propyl Glycol (SYSTANE OP) Place 1 drop into both eyes daily as needed (For dryness.).      Probiotic Product (FORTIFY DAILY PROBIOTIC PO) Take 1 capsule by mouth daily.      tiZANidine (ZANAFLEX) 2 MG tablet Take 1 tablet (2 mg total) by  mouth at bedtime as needed for muscle spasms. 15 tablet 0   No current facility-administered medications on file prior to visit.    BP 136/82   Pulse 62   Temp 98.1 F (36.7 C) (Oral)   Ht 5' 9.5" (1.765 m)   Wt 162 lb (73.5 kg)   SpO2 97%   BMI 23.58 kg/m       Objective:   Physical Exam Vitals and nursing note reviewed.  Constitutional:      Appearance: Normal appearance.  Cardiovascular:     Rate and Rhythm: Normal rate and regular rhythm.     Pulses: Normal pulses.     Heart sounds: Normal heart sounds.  Pulmonary:     Effort: Pulmonary effort is normal.     Breath sounds: Normal breath sounds.  Abdominal:     General: Abdomen is flat. Bowel sounds are normal. There is distension.     Palpations: Abdomen is soft.     Tenderness: There is generalized abdominal tenderness.     Comments: Hard stool felt with palpation   Musculoskeletal:        General: Normal range of motion.  Skin:    General: Skin is warm and dry.  Neurological:     General: No focal deficit present.     Mental Status: She is alert and oriented to person, place, and time.  Psychiatric:        Mood and Affect: Mood normal.        Behavior: Behavior normal.        Thought Content: Thought content normal.        Judgment: Judgment normal.        Assessment & Plan:  1. Acute left-sided low back pain without sciatica - Likely due to constipation. Does not appear muscular in nature. No pain to Crystal [name] back with palpation   2. Constipation, unspecified constipation type - Stop senokot  - Can start with 1/2 bottle of magnesium citrate  -  Low back pain should resolve once constipation resolves  - She can start using Metamucil daily  - Drink plenty of water   Shirline Frees, NP  Time spent with patient today was 32 minutes which consisted of chart review, discussing low back pain and constipation  work up, treatment answering questions and documentation.

## 2023-02-17 ENCOUNTER — Encounter: Payer: Self-pay | Admitting: Cardiology

## 2023-02-17 ENCOUNTER — Ambulatory Visit: Payer: Medicare Other | Attending: Cardiology | Admitting: Cardiology

## 2023-02-17 VITALS — BP 124/74 | HR 58 | Ht 69.5 in | Wt 160.4 lb

## 2023-02-17 DIAGNOSIS — I480 Paroxysmal atrial fibrillation: Secondary | ICD-10-CM | POA: Insufficient documentation

## 2023-02-17 DIAGNOSIS — D6869 Other thrombophilia: Secondary | ICD-10-CM | POA: Diagnosis present

## 2023-02-17 NOTE — Progress Notes (Signed)
  Electrophysiology Office Note:   Date:  02/17/2023  ID:  Crystal [name], DOB [birth year], MRN [id number]  Primary Cardiologist: Tonny Bollman, MD Electrophysiologist: Regan Lemming, MD      History of Present Illness:   Crystal [name] is a 72 y.o. female with h/o atrial fibrillation seen today for routine electrophysiology followup.  Since last being seen in our clinic the patient reports doing well since her flecainide dose was increased.  She has noted no episodes of atrial fibrillation.  She has no complaints at this time.  Her and her husband are planning on taking a trip to [place] and [place] in a few months.  she denies chest pain, palpitations, dyspnea, PND, orthopnea, nausea, vomiting, dizziness, syncope, edema, weight gain, or early satiety.      She has a history of right-sided breast cancer and atrial fibrillation.  She was diagnosed with atrial fibrillation 01/18/2020.  She is post ablation 05/25/2020.  She has had recurrent episodes of atrial fibrillation and is now on flecainide.      Review of systems complete and found to be negative unless listed in HPI.   EP Information / Studies Reviewed:    EKG is ordered today. Personal review as below.  EKG Interpretation Date/Time:  Tuesday February 17 2023 14:00:10 EDT Ventricular Rate:  58 PR Interval:  180 QRS Duration:  94 QT Interval:  438 QTC Calculation: 429 R Axis:   77  Text Interpretation: Sinus bradycardia When compared with ECG of 25-Jun-2020 09:37, No significant change was found Confirmed by Dae Highley ([id number]) on 02/17/2023 2:13:23 PM     Risk Assessment/Calculations:    CHA2DS2-VASc Score = 2   This indicates a 2.2% annual risk of stroke. The patient's score is based upon: CHF History: 0 HTN History: 0 Diabetes History: 0 Stroke History: 0 Vascular Disease History: 0 Age Score: 1 Gender Score: 1              Physical Exam:   VS:  BP 124/74 (BP Location: Left Arm, Patient  Position: Sitting, Cuff Size: Normal)   Pulse (!) 58   Ht 5' 9.5" (1.765 m)   Wt 160 lb 6.4 oz (72.8 kg)   SpO2 98%   BMI 23.35 kg/m    Wt Readings from Last 3 Encounters:  02/17/23 160 lb 6.4 oz (72.8 kg)  02/11/23 162 lb (73.5 kg)  01/28/23 162 lb (73.5 kg)     GEN: Well nourished, well developed in no acute distress NECK: No JVD; No carotid bruits CARDIAC: Regular rate and rhythm, no murmurs, rubs, gallops RESPIRATORY:  Clear to auscultation without rales, wheezing or rhonchi  ABDOMEN: Soft, non-tender, non-distended EXTREMITIES:  No edema; No deformity   ASSESSMENT AND PLAN:    1.  Paroxysmal atrial fibrillation: Post ablation 05/25/2020.  Currently on Eliquis, flecainide, Toprol.  She is unfortunately continued to have episodes of atrial fibrillation despite higher dose of flecainide.  Since being on a higher dose of flecainide, she has done well without further episodes of atrial fibrillation.  2.  Secondary hypercoagulable state: Currently on Eliquis for atrial fibrillation  Follow up with Dr. Elberta Fortis in 6 months  Signed, Jaslynne Dahan Jorja Loa, MD

## 2023-03-22 ENCOUNTER — Other Ambulatory Visit: Payer: Self-pay | Admitting: Cardiology

## 2023-03-22 DIAGNOSIS — I480 Paroxysmal atrial fibrillation: Secondary | ICD-10-CM

## 2023-03-24 NOTE — Telephone Encounter (Signed)
Eliquis 5mg  refill request received. Patient is 72 years old, weight-72.8kg, Crea-1.03 on 09/19/22, Diagnosis-Afib, and last seen by Dr. Elberta Fortis on 02/17/23. Dose is appropriate based on dosing criteria. Will send in refill to requested pharmacy.

## 2023-03-26 ENCOUNTER — Ambulatory Visit (INDEPENDENT_AMBULATORY_CARE_PROVIDER_SITE_OTHER): Payer: Medicare Other | Admitting: Internal Medicine

## 2023-03-26 ENCOUNTER — Encounter: Payer: Self-pay | Admitting: Internal Medicine

## 2023-03-26 VITALS — BP 116/60 | HR 54 | Resp 18 | Ht 69.5 in

## 2023-03-26 DIAGNOSIS — E0590 Thyrotoxicosis, unspecified without thyrotoxic crisis or storm: Secondary | ICD-10-CM | POA: Diagnosis not present

## 2023-03-26 DIAGNOSIS — E0500 Thyrotoxicosis with diffuse goiter without thyrotoxic crisis or storm: Secondary | ICD-10-CM | POA: Diagnosis not present

## 2023-03-26 NOTE — Progress Notes (Addendum)
Patient ID: Crystal [name], female   DOB: [birth year], 72 y.o.   MRN: [id number]  HPI  Crystal [name] is a 72 y.o.-year-old female, returning for follow-up for thyrotoxicosis.  She previously saw Dr. Everardo All, last visit 6 months ago.    Interim history: She had palpitations, now resolved after increasing Flecainide dose. She continues to have fatigue.  Before last visit she mentioned a 4 pound weight gain.  She lost ~3 pounds since then. She has anxiety - chronic. She sleeps poorly - waked up sometimes every 2h, difficult to fall asleep sometimes.  Reviewed history: Patient describes that she was diagnosed with thyrotoxicosis in 2021 - after she presented to her PCP with due to unintentional weight loss (15 lbs in 1 year).  A TSH was checked at that time and it was low, while the free T4 was normal.  She was referred to Dr. Everardo All.  At the time of the visit, a TSH was suppressed and free T4 was high.  She was started on methimazole.  She had to decrease the dose of methimazole in 2022.  She is currently on: - Methimazole (MMI) 5 mg daily >> 2.5 mg daily (dose decreased 02/2022) - Toprol XL 50 mg daily.  Also on flecainide 2x a day.  I reviewed pt's thyroid tests: Lab Results  Component Value Date   TSH 0.99 09/22/2022   TSH 3.13 04/23/2022   TSH 4.13 03/20/2022   TSH 2.20 10/16/2021   TSH 3.78 07/16/2021   TSH 2.51 05/10/2021   TSH 16.04 (H) 03/08/2021   TSH 4.36 12/19/2020   TSH <0.01 Repeated and verified X2. (L) 09/04/2020   TSH 0.08 (L) 01/05/2020   FREET4 0.83 09/22/2022   FREET4 0.87 04/23/2022   FREET4 0.77 03/20/2022   FREET4 0.79 10/16/2021   FREET4 0.83 07/16/2021   FREET4 0.85 05/10/2021   FREET4 0.53 (L) 03/08/2021   FREET4 0.46 (L) 12/19/2020   FREET4 1.79 (H) 09/06/2020   FREET4 1.4 01/05/2020   T3FREE 2.4 09/22/2022   T3FREE 3.1 04/23/2022   T3FREE 2.7 03/20/2022   Lab Results  Component Value Date   T3FREE 2.4 09/22/2022   T3FREE 3.1 04/23/2022    T3FREE 2.7 03/20/2022   Antithyroid antibodies: Lab Results  Component Value Date   TSI 192 (H) 03/20/2022   Pt denies: - feeling nodules in neck - hoarseness - dysphagia - choking  She denies: - excessive sweating/heat intolerance - tremors - weight loss   She has: - anxiety - insomnia  Pt does have a FH of thyroid ds. : mother with hypothyroidism. No FH of thyroid cancer. No h/o radiation tx to head or neck. No recent contrast studies. No steroid use. No herbal supplements. No Biotin use.  Pt. also has a history of breast cancer, s/p RxTx.    She also has paroxysmal atrial fibrillation in 2021 (before her diagnosis of Thyrotoxicosis) - had an ablation procedure; on metoprolol, flecainide, Eliquis.  ROS: Constitutional: + see HPI  Past Medical History:  Diagnosis Date   BACK PAIN, LUMBAR    Breast cancer of lower-outer quadrant of right female breast (HCC) 10/24/2014   Breast cancer, right breast    FIBROADENOMA, BREAST 2006   HPV in female    Hyperthyroidism    New onset atrial fibrillation (HCC) 01/06/2020   Palpitations 01/06/2020   PARESTHESIA    toes   PONV (postoperative nausea and vomiting)    Radiation 11/23/14-12/25/14   right breast 42.72 gray, lumpectomy cavity boosted  to 54.72 gray   Routine health maintenance 03/30/2013   Colonoscopy - for colonoscopy (Sept '14)  Immunization  Tdap Oct '14    Spider veins of both lower extremities 05/09/2019   Wears glasses    Past Surgical History:  Procedure Laterality Date   ATRIAL FIBRILLATION ABLATION N/A 05/25/2020   Procedure: ATRIAL FIBRILLATION ABLATION;  Surgeon: Regan Lemming, MD;  Location: [facility];  Service: Cardiovascular;  Laterality: N/A;   atrophic vaginitis     BREAST LUMPECTOMY WITH RADIOACTIVE SEED LOCALIZATION Right 10/18/2014   Procedure: BREAST LUMPECTOMY WITH RADIOACTIVE SEED LOCALIZATION;  Surgeon: Abigail Miyamoto, MD;  Location: [facility];  Service:  General;  Laterality: Right;   COLONOSCOPY  09/05/2013   D & C and hysteroscopy  11/1998   for uterine polyp   DILATATION & CURRETTAGE/HYSTEROSCOPY WITH RESECTOCOPE N/A 05/30/2015   Procedure: DILATATION & CURETTAGE/HYSTEROSCOPY WITH RESECTOCOPE;  Surgeon: Genia Del, MD;  Location: [facility];  Service: Gynecology;  Laterality: N/A;   DILATION AND CURETTAGE OF UTERUS     Sterotactix needle biopsy  2000   breast calcification   Social History   Socioeconomic History   Marital status: Married    Spouse name: howard   Number of children: 2   Years of education: 16   Highest education level: Bachelor's degree (e.g., BA, AB, BS)  Occupational History   Occupation: PROPERTY MGR    Employer: [employer]  Tobacco Use   Smoking status: Never   Smokeless tobacco: Never  Vaping Use   Vaping status: Never Used  Substance and Sexual Activity   Alcohol use: Yes    Comment: 1 a month   Drug use: No   Sexual activity: Yes    Partners: Male    Birth control/protection: Post-menopausal    Comment: 1st intercourse- 21, partners- less than 5  Other Topics Concern   Not on file  Social History Narrative   HSG, [employer] [place], Married '78,  1 son '81, 1 daughter '84, 1 grandson '12.    Work - Geneticist, molecular.  Lives with husband. ACP - discussed with patient.    Refer - [email or web address]   Highest level of education:  BS   Right handed   Two story home   Social Determinants of Health   Financial Resource Strain: Low Risk  (02/11/2023)   Overall Financial Resource Strain (CARDIA)    Difficulty of Paying Living Expenses: Not hard at all  Food Insecurity: No Food Insecurity (02/11/2023)   Hunger Vital Sign    Worried About Running Out of Food in the Last Year: Never true    Ran Out of Food in the Last Year: Never true  Transportation Needs: No Transportation Needs (02/11/2023)   PRAPARE - Administrator, Civil Service (Medical): No     Lack of Transportation (Non-Medical): No  Physical Activity: Insufficiently Active (02/11/2023)   Exercise Vital Sign    Days of Exercise per Week: 2 days    Minutes of Exercise per Session: 20 min  Stress: No Stress Concern Present (02/11/2023)   [employer] of Occupational Health - Occupational Stress Questionnaire    Feeling of Stress : Not at all  Social Connections: Socially Integrated (02/11/2023)   Social Connection and Isolation Panel [NHANES]    Frequency of Communication with Friends and Family: More than three times a week    Frequency of Social Gatherings with Friends and Family: More than three  times a week    Attends Religious Services: More than 4 times per year    Active Member of Clubs or Organizations: Yes    Attends Banker Meetings: More than 4 times per year    Marital Status: Married  Catering manager Violence: Not At Risk (09/29/2022)   Humiliation, Afraid, Rape, and Kick questionnaire    Fear of Current or Ex-Partner: No    Emotionally Abused: No    Physically Abused: No    Sexually Abused: No   Current Outpatient Medications on File Prior to Visit  Medication Sig Dispense Refill   acetaminophen (TYLENOL) 325 MG tablet Take 325 mg by mouth as needed for moderate pain or headache.     apixaban (ELIQUIS) 5 MG TABS tablet TAKE ONE TABLET BY MOUTH TWICE DAILY 180 tablet 1   Calcium Carbonate-Vitamin D 600-400 MG-UNIT tablet Take 1 tablet by mouth daily.     cetirizine (ZYRTEC) 10 MG tablet Take 10 mg by mouth daily.     Cholecalciferol (VITAMIN D3) 1000 UNITS CAPS Take 1,000 Units by mouth daily.     flecainide (TAMBOCOR) 100 MG tablet Take 1 tablet (100 mg total) by mouth 2 (two) times daily. 60 tablet 6   methimazole (TAPAZOLE) 5 MG tablet Take 1 tablet (5 mg total) by mouth every other day. 45 tablet 3   metoprolol succinate (TOPROL-XL) 50 MG 24 hr tablet Take 1 tablet (50 mg total) by mouth daily. Take with or immediately following a meal. 90  tablet 3   Multiple Vitamin (MULTIVITAMIN) tablet Take 1 tablet by mouth daily.     Polyethyl Glycol-Propyl Glycol (SYSTANE OP) Place 1 drop into both eyes daily as needed (For dryness.).      Probiotic Product (FORTIFY DAILY PROBIOTIC PO) Take 1 capsule by mouth daily.      tiZANidine (ZANAFLEX) 2 MG tablet Take 1 tablet (2 mg total) by mouth at bedtime as needed for muscle spasms. 15 tablet 0   No current facility-administered medications on file prior to visit.   No Known Allergies Family History  Problem Relation Age of Onset   Hypothyroidism Mother    GER disease Mother    Osteoporosis Mother    Hypertension Mother    Cancer Mother        hodgkins lympoma, uterine cancer   Arrhythmia Mother    Hyperlipidemia Father    Hypertension Father    Diabetes Father    Coronary artery disease Father    Alzheimer's disease Brother    Coronary artery disease Maternal Grandfather    COPD Neg Hx    Esophageal cancer Neg Hx    Rectal cancer Neg Hx    Stomach cancer Neg Hx    PE: BP 116/60 (BP Location: Left Arm, Patient Position: Sitting, Cuff Size: Normal)   Pulse (!) 54   Resp 18   Ht 5' 9.5" (1.765 m)   SpO2 94%   BMI 23.35 kg/m   Wt Readings from Last 10 Encounters:  02/17/23 160 lb 6.4 oz (72.8 kg)  02/11/23 162 lb (73.5 kg)  01/28/23 162 lb (73.5 kg)  10/06/22 163 lb 3.2 oz (74 kg)  09/29/22 163 lb (73.9 kg)  09/22/22 163 lb (73.9 kg)  09/19/22 162 lb 9.6 oz (73.8 kg)  07/16/22 161 lb (73 kg)  07/04/22 164 lb (74.4 kg)  06/10/22 162 lb (73.5 kg)   Constitutional: normal weight, in NAD Eyes: EOMI, no exophthalmos, no lid lag, no stare ENT: no thyromegaly,  no cervical LAD Cardiovascular: RRR, No MRG Respiratory: CTA B Musculoskeletal: no deformities Skin: no rashes Neurological: + very faint tremor with outstretched hands  ASSESSMENT: 1. Graves disease  PLAN:  1. Patient with history of a suppressed TSH with elevated free T4 (do not have records of the free T3  level), with unintentional weight loss of 15 pounds but no other signs of thyrotoxicosis including heat intolerance, hyperdefecation, palpitations, anxiety.  Retrospectively, she was diagnosed with atrial fibrillation in the same year, before her diagnosis of thyrotoxicosis.  She had an ablation and then started on beta-blockers, Eliquis, and flecainide. -She was assumed to have Graves' disease and started on methimazole.  Her TSIs were elevated, confirming Graves' disease.  We decreased methimazole from 5 mg every day to 2.5 mg daily.  She was on this dose at last visit. -At last visit, TFTs were all normal and she had no thyrotoxic signs or symptoms except for palpitations, but these were not new. -At today's visit, she continues to have no thyrotoxic signs or symptoms.  She mentions that her palpitations resolved after her flecainide dose was increased to twice a day.  She does have problems sleeping in the last 2 months.  She also continues to have anxiety, but this is chronic for her. -We did discuss about possible modalities of treatment for Graves' disease to include staying on the methimazole, or doing radioactive iodine ablation or thyroid surgery.  Since she is responding well to a very low dose of methimazole, we can continue methimazole for now.  She is aware about possible side effects which include decreased white blood cell count and liver dysfunction. -At today's visit she is not tachycardic or tremulous.  She continues on beta-blocker per cardiology. -She has dry eyes, for which tried different treatments in the past without good results.  We discussed that only xerophthalmia is not enough to diagnose thyroid eye disease.  She denies blurry vision, eye pressure, pain with eye movement, or ecchymosis. -We will check her TFTs today and change the methimazole dose accordingly.  Needs MMI refills.  Component     Latest Ref Rng 03/26/2023  TSH     0.35 - 5.50 uIU/mL 0.45   T4,Free(Direct)      0.60 - 1.60 ng/dL 9.52   Triiodothyronine,Free,Serum     2.3 - 4.2 pg/mL 3.3   TSH is slightly low >> would suggest to increase methimazole to 5 mg daily and repeat the test in 1 month.  Carlus Pavlov, MD PhD [facility] Endocr[facility]

## 2023-03-26 NOTE — Patient Instructions (Signed)
Please stop at the lab.  For now, continue Methimazole 2.5 mg daily.  You should have an endocrinology follow-up appointment in 1 year, but in 6 months for labs.

## 2023-03-27 LAB — TSH: TSH: 0.45 u[IU]/mL (ref 0.35–5.50)

## 2023-03-27 LAB — T4, FREE: Free T4: 0.97 ng/dL (ref 0.60–1.60)

## 2023-03-27 LAB — T3, FREE: T3, Free: 3.3 pg/mL (ref 2.3–4.2)

## 2023-03-27 MED ORDER — METHIMAZOLE 5 MG PO TABS
5.0000 mg | ORAL_TABLET | Freq: Every day | ORAL | 1 refills | Status: DC
Start: 2023-03-27 — End: 2023-09-24

## 2023-03-27 NOTE — Addendum Note (Signed)
Addended by: Carlus Pavlov on: 03/27/2023 03:21 PM   Modules accepted: Orders

## 2023-05-06 ENCOUNTER — Other Ambulatory Visit (INDEPENDENT_AMBULATORY_CARE_PROVIDER_SITE_OTHER): Payer: Medicare Other

## 2023-05-06 DIAGNOSIS — E0500 Thyrotoxicosis with diffuse goiter without thyrotoxic crisis or storm: Secondary | ICD-10-CM

## 2023-05-06 LAB — T3, FREE: T3, Free: 3.1 pg/mL (ref 2.3–4.2)

## 2023-05-06 LAB — TSH: TSH: 1.24 u[IU]/mL (ref 0.35–5.50)

## 2023-05-06 LAB — T4, FREE: Free T4: 0.8 ng/dL (ref 0.60–1.60)

## 2023-06-28 ENCOUNTER — Other Ambulatory Visit: Payer: Self-pay | Admitting: Cardiology

## 2023-07-06 ENCOUNTER — Ambulatory Visit: Payer: Medicare Other | Admitting: Neurology

## 2023-07-07 ENCOUNTER — Encounter: Payer: Self-pay | Admitting: Neurology

## 2023-07-07 ENCOUNTER — Ambulatory Visit (INDEPENDENT_AMBULATORY_CARE_PROVIDER_SITE_OTHER): Payer: Medicare Other | Admitting: Neurology

## 2023-07-07 VITALS — BP 115/74 | HR 60 | Ht 69.5 in | Wt 164.0 lb

## 2023-07-07 DIAGNOSIS — G629 Polyneuropathy, unspecified: Secondary | ICD-10-CM

## 2023-07-07 NOTE — Progress Notes (Signed)
 Follow-up Visit   Date: 07/07/23   Crystal [name] MRN: [id number] DOB: [birth year]   Interim History: Crystal [name] is a 73 y.o. right-handed Caucasian female with right breast cancer s/p radiation (2016), and hyperthyroidism, and atrial fibrillation returning to the clinic for follow-up of bilateral feet numbness/tingling.  The patient was accompanied to the clinic by self.  IMPRESSION/PLAN: Early and distal idiopathic neuropathy, manifesting with distal paresthesias.  Stable.  NCS/EMG from January 2020 was normal, although history highly suggestive of neuropathy.  I will repeat NCS/EMG to look for interval change.  Continue supportive care.   Return to clinic 1 year  ------------------------------------------------------ History of present illness: Starting around 2011, she started having tingling of the tips of her toes.  She saw Dr. Harlow and was told she may have early signs of neuropathy.  During the summer of 2018, she starting having burning sensation over the toes and the pad of the feet.   She recalls having to take her shoes off her feet because of severe pain.  Lately, she does not have severe pain, but continues to have tingling and numbness of the toes, which is worse on the left.  She denies any imbalance, falls, or weakness.    She denies any history of diabetes mellitus, alcohol abuse, or family history neuropathy.   She had normal EDX of the legs performed in January 2020 which was normal.  UPDATE 07/04/2022:  She continues to have burning and tingling of the balls feet, worse on the left which is worse in the evening.  No weakness, numbness, or imbalance.  She continues to manage office buildings (self-employed) and stays busy and very active.  She does yoga once weekly.  No new complaints.   UPDATE 07/06/2022:  She is here for 1 year follow-up visit.  She continues to have burning and tingling in the toes and balls of the feet, worse on the left.  There has  been no significant change over the past year.  Symptoms are aggravating and annoying more than anything. She remains highly independent and continues to work.  No interval falls, hospitalizations, or illnesses.   Medications:  Current Outpatient Medications on File Prior to Visit  Medication Sig Dispense Refill   acetaminophen  (TYLENOL ) 325 MG tablet Take 325 mg by mouth as needed for moderate pain or headache.     apixaban  (ELIQUIS ) 5 MG TABS tablet TAKE ONE TABLET BY MOUTH TWICE DAILY 180 tablet 1   Calcium Carbonate-Vitamin D  600-400 MG-UNIT tablet Take 1 tablet by mouth daily.     Cholecalciferol (VITAMIN D3) 1000 UNITS CAPS Take 1,000 Units by mouth daily.     flecainide  (TAMBOCOR ) 100 MG tablet Take 1 tablet (100 mg total) by mouth 2 (two) times daily. 60 tablet 7   methimazole  (TAPAZOLE ) 5 MG tablet Take 1 tablet (5 mg total) by mouth daily. 90 tablet 1   metoprolol  succinate (TOPROL -XL) 50 MG 24 hr tablet Take 1 tablet (50 mg total) by mouth daily. Take with or immediately following a meal. 90 tablet 3   Multiple Vitamin (MULTIVITAMIN) tablet Take 1 tablet by mouth daily.     Polyethyl Glycol-Propyl Glycol (SYSTANE OP) Place 1 drop into both eyes daily as needed (For dryness.).      Probiotic Product (FORTIFY DAILY PROBIOTIC PO) Take 1 capsule by mouth daily.      No current facility-administered medications on file prior to visit.    Allergies: No Known Allergies  Vital Signs:  BP 115/74   Pulse 60   Ht 5' 9.5 (1.765 m)   Wt 164 lb (74.4 kg)   SpO2 97%   BMI 23.87 kg/m   Neurological Exam: MENTAL STATUS including orientation to time, place, person, recent and remote memory, attention span and concentration, language, and fund of knowledge is normal.  Speech is not dysarthric.  CN:  Extraocular muscular are intact.  Face is symmetric.   MOTOR:  Motor strength is 5/5 in all extremities, including distally in the feet.  No pronator drift.  Tone is normal.    MSRs:   Reflexes are 2+/4 throughout .  SENSORY:  Intact to vibration, pin prick, and temperature throughout. Rhomberg testing is negative.   COORDINATION/GAIT:   Gait narrow based and stable.  Stressed and tandem gait intact.    Data: NCS/EMG of the legs 07/20/2018:  This is a normal study of the lower extremities.  In particular, there is no evidence of a sensorimotor polyneuropathy or lumbosacral radiculopathy.    Labs10/10/2016:  ESR 41, vitamin B12 597, vitamin B1 20, SPEP with IFE no M protein, glucose tolerance testing normal    Thank you for allowing me to participate in patient's care.  If I can answer any additional questions, I would be pleased to do so.    Sincerely,    Raider Valbuena K. Tobie, DO

## 2023-07-07 NOTE — Patient Instructions (Signed)
Nerve testing of both legs.  ELECTROMYOGRAM AND NERVE CONDUCTION STUDIES (EMG/NCS) INSTRUCTIONS  How to Prepare The neurologist conducting the EMG will need to know if you have certain medical conditions. Tell the neurologist and other EMG lab personnel if you: . Have a pacemaker or any other electrical medical device . Take blood-thinning medications . Have hemophilia, a blood-clotting disorder that causes prolonged bleeding Bathing Take a shower or bath shortly before your exam in order to remove oils from your skin. Don't apply lotions or creams before the exam.  What to Expect You'll likely be asked to change into a hospital gown for the procedure and lie down on an examination table. The following explanations can help you understand what will happen during the exam.  . Electrodes. The neurologist or a technician places surface electrodes at various locations on your skin depending on where you're experiencing symptoms. Or the neurologist may insert needle electrodes at different sites depending on your symptoms.  . Sensations. The electrodes will at times transmit a tiny electrical current that you may feel as a twinge or spasm. The needle electrode may cause discomfort or pain that usually ends shortly after the needle is removed. If you are concerned about discomfort or pain, you may want to talk to the neurologist about taking a short break during the exam.  . Instructions. During the needle EMG, the neurologist will assess whether there is any spontaneous electrical activity when the muscle is at rest - activity that isn't present in healthy muscle tissue - and the degree of activity when you slightly contract the muscle.  He or she will give you instructions on resting and contracting a muscle at appropriate times. Depending on what muscles and nerves the neurologist is examining, he or she may ask you to change positions during the exam.  After your EMG You may experience some  temporary, minor bruising where the needle electrode was inserted into your muscle. This bruising should fade within several days. If it persists, contact your primary care doctor.    

## 2023-07-24 ENCOUNTER — Ambulatory Visit (INDEPENDENT_AMBULATORY_CARE_PROVIDER_SITE_OTHER): Payer: Medicare Other | Admitting: Neurology

## 2023-07-24 DIAGNOSIS — G629 Polyneuropathy, unspecified: Secondary | ICD-10-CM

## 2023-07-24 NOTE — Procedures (Signed)
[facility]  [address] [place], [address]  [place], [place] [address] Tel: [phone] Fax: [phone] Test Date:  07/24/2023  Patient: Crystal [name] DOB: 07-Dec-1950 Physician: Nita Sickle, DO  Sex: Female Height: 5\' 9"  Ref Phys: Nita Sickle, DO  ID#: [id number]   Technician:    History: This is a 73 year old female referred for evaluation of bilateral feet paresthesias.  NCV & EMG Findings: Extensive electrodiagnostic testing of the right lower extremity and additional studies of the left shows:  Bilateral sural and superficial peroneal sensory responses are within normal limits. Bilateral peroneal motor responses at the extensor digitorum brevis shows reduced amplitude, and is normal at the tibialis anterior.  Bilateral tibial motor responses are within normal limits. Bilateral tibial H reflex studies are within normal limits. There is no evidence of active or chronic motor axonal loss changes affecting any of the tested muscles.  Motor unit configuration and recruitment pattern is within normal limits.  Proximal and deep muscles were not tested as the patient is on anticoagulation therapy.  Impression: This is a normal study of the lower extremities.  Compared to prior study on 07/20/2018, there has been no significant change.   In particular, there is no evidence of a large fiber sensorimotor polyneuropathy or lumbosacral radiculopathy.   ___________________________ Nita Sickle, DO    Nerve Conduction Studies   Stim Site NR Peak (ms) Norm Peak (ms) O-P Amp (V) Norm O-P Amp  Left Sup Peroneal Anti Sensory (Ant Lat Mall)  32 C  12 cm    3.2 <4.6 7.1 >3  Right Sup Peroneal Anti Sensory (Ant Lat Mall)  32 C  12 cm    2.8 <4.6 6.4 >3  Left Sural Anti Sensory (Lat Mall)  32 C  Calf    3.8 <4.6 8.7 >3  Right Sural Anti Sensory (Lat Mall)  32 C  Calf    3.8 <4.6 10.1 >3     Stim Site NR Onset (ms) Norm Onset (ms) O-P Amp (mV) Norm O-P Amp Site1 Site2  Delta-0 (ms) Dist (cm) Vel (m/s) Norm Vel (m/s)  Left Peroneal Motor (Ext Dig Brev)  32 C  Ankle    4.0 <6.0 *1.0 >2.5 B Fib Ankle 9.1 38.0 42 >40  B Fib    13.1  0.7  Poplt B Fib 1.8 8.0 44 >40  Poplt    14.9  0.7         Right Peroneal Motor (Ext Dig Brev)  32 C  Ankle    4.5 <6.0 *2.1 >2.5 B Fib Ankle 8.0 38.0 48 >40  B Fib    12.5  2.1  Poplt B Fib 1.7 8.0 47 >40  Poplt    14.2  2.1         Left Peroneal TA Motor (Tib Ant)  32 C  Fib Head    3.5 <4.5 3.6 >3 Poplit Fib Head 1.1 8.0 73 >40  Poplit    4.6 <5.7 3.3         Right Peroneal TA Motor (Tib Ant)  32 C  Fib Head    3.0 <4.5 4.4 >3 Poplit Fib Head 1.1 8.0 73 >40  Poplit    4.1 <5.7 4.4         Left Tibial Motor (Abd Hall Brev)  32 C  Ankle    4.3 <6.0 7.2 >4 Knee Ankle 10.1 42.0 42 >40  Knee    14.4  4.2  Right Tibial Motor (Abd Hall Brev)  32 C  Ankle    4.9 <6.0 10.5 >4 Knee Ankle 9.9 44.0 44 >40  Knee    14.8  7.6          Electromyography   Side Muscle Ins.Act Fibs Fasc Recrt Amp Dur Poly Activation Comment  Right AntTibialis Nml Nml Nml Nml Nml Nml Nml Nml N/A  Right Gastroc Nml Nml Nml Nml Nml Nml Nml Nml N/A  Right RectFemoris Nml Nml Nml Nml Nml Nml Nml Nml N/A  Left AntTibialis Nml Nml Nml Nml Nml Nml Nml Nml N/A  Left Gastroc Nml Nml Nml Nml Nml Nml Nml Nml N/A  Left RectFemoris Nml Nml Nml Nml Nml Nml Nml Nml N/A      Waveforms:

## 2023-08-27 ENCOUNTER — Ambulatory Visit (INDEPENDENT_AMBULATORY_CARE_PROVIDER_SITE_OTHER): Payer: Medicare Other | Admitting: Internal Medicine

## 2023-08-27 VITALS — BP 110/80 | HR 60 | Temp 98.3°F | Ht 69.5 in | Wt 164.0 lb

## 2023-08-27 DIAGNOSIS — R509 Fever, unspecified: Secondary | ICD-10-CM | POA: Diagnosis not present

## 2023-08-27 DIAGNOSIS — R059 Cough, unspecified: Secondary | ICD-10-CM

## 2023-08-27 DIAGNOSIS — Z8679 Personal history of other diseases of the circulatory system: Secondary | ICD-10-CM | POA: Diagnosis not present

## 2023-08-27 DIAGNOSIS — Z853 Personal history of malignant neoplasm of breast: Secondary | ICD-10-CM

## 2023-08-27 DIAGNOSIS — J101 Influenza due to other identified influenza virus with other respiratory manifestations: Secondary | ICD-10-CM

## 2023-08-27 DIAGNOSIS — E0590 Thyrotoxicosis, unspecified without thyrotoxic crisis or storm: Secondary | ICD-10-CM

## 2023-08-27 LAB — POC COVID19/FLU A&B COMBO
Covid Antigen, POC: NEGATIVE
Influenza A Antigen, POC: POSITIVE — AB
Influenza B Antigen, POC: NEGATIVE

## 2023-08-27 MED ORDER — BENZONATATE 100 MG PO CAPS: 100.0000 mg | ORAL_CAPSULE | Freq: Three times a day (TID) | ORAL | 0 refills | Status: DC | PRN

## 2023-08-27 MED ORDER — AZITHROMYCIN 250 MG PO TABS: ORAL_TABLET | ORAL | 0 refills | Status: AC

## 2023-08-27 MED ORDER — ONDANSETRON HCL 4 MG PO TABS: 4.0000 mg | ORAL_TABLET | Freq: Three times a day (TID) | ORAL | 0 refills | Status: DC | PRN

## 2023-08-27 NOTE — Progress Notes (Signed)
 Patient Care Team: Myrlene Broker, MD as PCP - General (Internal Medicine) Tonny Bollman, MD as PCP - Cardiology (Cardiology) Regan Lemming, MD as PCP - Electrophysiology (Cardiology) Salomon Fick, NP as Nurse Practitioner (Nurse Practitioner) Genia Del, MD as Consulting Physician (Obstetrics and Gynecology) Glendale Chard, DO as Consulting Physician (Neurology) Pa, [facility] as Consulting Physician (Ophthalmology)  Visit Date: 08/27/23  Subjective:   Chief Complaint  Patient presents with   Cough   Fever  Patient ZO:XWRUEAV B Tillman,Female DOB:1950/11/03,72 y.o. WUJ:[id number]   73 y.o. Female presents today for acute sick visit with Cough, Fever, and Fatigue. She reports that she is UTD on flu vaccine since since 03/2023. Endorses rhinorrhea & cough on Monday that escalated into fatigue on Tuesday, with elevated temperature of 99.9 peaking to a fever of 100.1 and chills on Wednesday. Today reports cough w/ occasional expectoration and nausea.  Past Medical History:  Diagnosis Date   BACK PAIN, LUMBAR    Breast cancer of lower-outer quadrant of right female breast (HCC) 10/24/2014   Breast cancer, right breast    FIBROADENOMA, BREAST 2006   HPV in female    Hyperthyroidism    New onset atrial fibrillation (HCC) 01/06/2020   Palpitations 01/06/2020   PARESTHESIA    toes   PONV (postoperative nausea and vomiting)    Radiation 11/23/14-12/25/14   right breast 42.72 gray, lumpectomy cavity boosted to 54.72 gray   Routine health maintenance 03/30/2013   Colonoscopy - for colonoscopy (Sept '14)  Immunization  Tdap Oct '14    Spider veins of both lower extremities 05/09/2019   Wears glasses   No Known Allergies  Family History  Problem Relation Age of Onset   Hypothyroidism Mother    GER disease Mother    Osteoporosis Mother    Hypertension Mother    Cancer Mother        hodgkins lympoma, uterine cancer   Arrhythmia  Mother    Hyperlipidemia Father    Hypertension Father    Diabetes Father    Coronary artery disease Father    Alzheimer's disease Brother    Coronary artery disease Maternal Grandfather    COPD Neg Hx    Esophageal cancer Neg Hx    Rectal cancer Neg Hx    Stomach cancer Neg Hx    Social History   Social History Narrative   [employer], [employer] [place], Married '78,  1 son '81, 1 daughter '84, 1 grandson '12.    Work - Geneticist, molecular.  Lives with husband. ACP - discussed with patient.    Refer - [email or web address]   Highest level of education:  BS   Right handed   Two story home   Review of Systems  Constitutional:  Positive for chills, fever and malaise/fatigue.  HENT:  Negative for congestion.        (+) Rhinorrhea  Respiratory:  Positive for cough and sputum production (occasional).   Gastrointestinal:  Positive for nausea. Negative for vomiting.  All other systems reviewed and are negative.    Objective:  Vitals: BP 110/80   Pulse 60   Temp 98.3 F (36.8 C)   Ht 5' 9.5" (1.765 m)   Wt 164 lb (74.4 kg)   SpO2 98%   BMI 23.87 kg/m   Physical Exam Vitals and nursing note reviewed.  Constitutional:      General: She is not in acute distress.    Appearance: Normal appearance. She is not  toxic-appearing.     Comments: Hoarse  HENT:     Head: Normocephalic and atraumatic.     Ears:     Comments: Right ear looks good, normal light reflex, not red.  Left ear has some wax but is normal otherwise.     Mouth/Throat:     Pharynx: Posterior oropharyngeal erythema (slight) present. No oropharyngeal exudate.  Pulmonary:     Effort: Pulmonary effort is normal.     Breath sounds: Normal breath sounds.  Skin:    General: Skin is warm and dry.  Neurological:     Mental Status: She is alert and oriented to person, place, and time. Mental status is at baseline.  Psychiatric:        Mood and Affect: Mood normal.        Behavior: Behavior normal.         Thought Content: Thought content normal.        Judgment: Judgment normal.     Results:  Studies Obtained And Personally Reviewed By Me: Labs:     Component Value Date/Time   NA 143 09/19/2022 1502   NA 141 02/02/2017 0903   K 4.5 09/19/2022 1502   K 4.3 02/02/2017 0903   CL 103 09/19/2022 1502   CO2 26 09/19/2022 1502   CO2 31 (H) 02/02/2017 0903   GLUCOSE 81 09/19/2022 1502   GLUCOSE 70 09/24/2021 1434   GLUCOSE 71 02/02/2017 0903   BUN 19 09/19/2022 1502   BUN 18.9 02/02/2017 0903   CREATININE 1.03 (H) 09/19/2022 1502   CREATININE 1.11 (H) 01/05/2020 1503   CREATININE 0.9 02/02/2017 0903   CALCIUM 10.0 09/19/2022 1502   CALCIUM 9.7 02/02/2017 0903   PROT 7.1 09/24/2021 1434   PROT 6.9 02/02/2017 0903   ALBUMIN 4.3 09/24/2021 1434   ALBUMIN 3.7 02/02/2017 0903   AST 34 09/24/2021 1434   AST 21 02/02/2017 0903   ALT 40 (H) 09/24/2021 1434   ALT 17 02/02/2017 0903   ALKPHOS 68 09/24/2021 1434   ALKPHOS 57 02/02/2017 0903   BILITOT 0.4 09/24/2021 1434   BILITOT 0.40 02/02/2017 0903   GFRNONAA 58 (L) 05/01/2020 0956   GFRAA 67 05/01/2020 0956    Lab Results  Component Value Date   WBC 6.4 09/19/2022   HGB 12.1 09/19/2022   HCT 35.8 09/19/2022   MCV 91 09/19/2022   PLT 252 09/19/2022   Lab Results  Component Value Date   CHOL 196 09/24/2021   HDL 86.00 09/24/2021   LDLCALC 91 09/24/2021   TRIG 92.0 09/24/2021   CHOLHDL 2 09/24/2021   Lab Results  Component Value Date   HGBA1C 5.9 09/24/2021    Lab Results  Component Value Date   TSH 1.24 05/06/2023    Results for orders placed or performed in visit on 08/27/23  POC Covid19/Flu A&B Antigen  Result Value Ref Range   Influenza A Antigen, POC Positive (A) Negative   Influenza B Antigen, POC Negative Negative   Covid Antigen, POC Negative Negative   Assessment & Plan:   Influenza A; Fever; Cough: 250 mg Azithromycin - take 2 tablets on Day 1 and 1 tablet on Days 2-5, 100 mg Tessalon Perles -   take 1 capsule (100 mg total) by mouth 3 (three) times daily as needed, and 4 mg Zofran - take 1 tablet (4 mg total) by mouth every 8 (eight) hours as needed for nausea. Stay well  well hydrated, and well nourished. Walk around some to prevent  atelectasis. Contact us if symptoms worsen/persist despite treatment. Discussed Tamiflu but sxs present for a few days now. Likely would not be of much benefit.  Hyperthyroidism treated with tapazole   PAF seen by Cardiology  Hx of A-fib 2021 had recurrent episodes now on flecainide  Hx of breast cancer   I,Emily Lagle,acting as a scribe for Margaree Mackintosh, MD.,have documented all relevant documentation on the behalf of Margaree Mackintosh, MD,as directed by  Margaree Mackintosh, MD while in the presence of Margaree Mackintosh, MD.   I, Margaree Mackintosh, MD, have reviewed all documentation for this visit. The documentation on 08/27/23 for the exam, diagnosis, procedures, and orders are all accurate and complete.

## 2023-08-28 NOTE — Patient Instructions (Signed)
 Patient we are sorry you are not feeling well.  Rapid flu test is positive today for influenza A.  We discussed taking Tamiflu versus antibiotic and cough preparation.  Since symptoms have been present for several days, we decided on Zithromax Z-PAK to take 2 tabs day 1 followed by 1 tab days 2 through 5.  May take Zofran as needed for nausea.  Rest and stay well-hydrated.  Walk some to prevent atelectasis of the lungs.  Contact us if symptoms persist or worsen.  Continue other medications as previously prescribed for paroxysmal atrial fibrillation and hyperthyroidism.

## 2023-09-17 ENCOUNTER — Other Ambulatory Visit: Payer: Self-pay

## 2023-09-17 ENCOUNTER — Other Ambulatory Visit: Payer: Self-pay | Admitting: Internal Medicine

## 2023-09-17 DIAGNOSIS — E0500 Thyrotoxicosis with diffuse goiter without thyrotoxic crisis or storm: Secondary | ICD-10-CM

## 2023-09-17 DIAGNOSIS — E0590 Thyrotoxicosis, unspecified without thyrotoxic crisis or storm: Secondary | ICD-10-CM

## 2023-09-21 ENCOUNTER — Other Ambulatory Visit: Payer: Self-pay | Admitting: Cardiology

## 2023-09-21 DIAGNOSIS — I480 Paroxysmal atrial fibrillation: Secondary | ICD-10-CM

## 2023-09-21 NOTE — Telephone Encounter (Signed)
 Prescription refill request for Eliquis received. Indication: afib  Last office visit: [place], 02/17/2023 Scr: 1.03, 09/19/2022 Age: 73 yo  Weight: 74.4 kg   Pt overdue for blood work. Placed and released lab orders (Cbc and Bmet). Called pt and informed her that she will need to come in for blood work. Pt is scheduled to see Dr. Elberta Fortis on 4/1 (added to appointment note pt needs blood work). Refill sent.

## 2023-09-23 ENCOUNTER — Other Ambulatory Visit: Payer: Medicare Other

## 2023-09-23 LAB — T4, FREE: Free T4: 1.2 ng/dL (ref 0.8–1.8)

## 2023-09-23 LAB — T3, FREE: T3, Free: 2.9 pg/mL (ref 2.3–4.2)

## 2023-09-23 LAB — TSH: TSH: 4.26 m[IU]/L (ref 0.40–4.50)

## 2023-09-24 ENCOUNTER — Encounter: Payer: Self-pay | Admitting: Internal Medicine

## 2023-09-24 MED ORDER — METHIMAZOLE 5 MG PO TABS: 2.5000 mg | ORAL_TABLET | Freq: Every day | ORAL | Status: DC

## 2023-09-24 NOTE — Addendum Note (Signed)
 Addended by: Carlus Pavlov on: 09/24/2023 10:42 AM   Modules accepted: Orders

## 2023-09-29 ENCOUNTER — Ambulatory Visit: Payer: Medicare Other | Attending: Cardiology | Admitting: Cardiology

## 2023-09-29 ENCOUNTER — Encounter: Payer: Self-pay | Admitting: Cardiology

## 2023-09-29 VITALS — BP 110/70 | HR 53 | Ht 69.0 in | Wt 163.0 lb

## 2023-09-29 DIAGNOSIS — I480 Paroxysmal atrial fibrillation: Secondary | ICD-10-CM

## 2023-09-29 DIAGNOSIS — D6869 Other thrombophilia: Secondary | ICD-10-CM

## 2023-09-29 DIAGNOSIS — Z79899 Other long term (current) drug therapy: Secondary | ICD-10-CM

## 2023-09-29 NOTE — Progress Notes (Signed)
  Electrophysiology Office Note:   Date:  09/29/2023  ID:  JERRE DIGUGLIELMO, DOB 07-Aug-1950, MRN [id number]  Primary Cardiologist: Tonny Bollman, MD Primary Heart Failure: None Electrophysiologist: Briseidy Spark Jorja Loa, MD      History of Present Illness:   Crystal [name] is a 73 y.o. female with h/o atrial fibrillation seen today for routine electrophysiology followup.   Since last being seen in our clinic the patient reports doing.  She has noted no further episodes of atrial fibrillation.  She has been able to do all of her daily activities.  She recently traveled to [place] and [place] and had no issues with atrial fibrillation.  she denies chest pain, palpitations, dyspnea, PND, orthopnea, nausea, vomiting, dizziness, syncope, edema, weight gain, or early satiety.   Review of systems complete and found to be negative unless listed in HPI.   EP Information / Studies Reviewed:    EKG is ordered today. Personal review as below.  EKG Interpretation Date/Time:  Tuesday September 29 2023 09:41:04 EDT Ventricular Rate:  53 PR Interval:  200 QRS Duration:  96 QT Interval:  448 QTC Calculation: 420 R Axis:   77  Text Interpretation: Sinus bradycardia When compared with ECG of 17-Feb-2023 14:00, No significant change was found Confirmed by Lainee Lehrman ([id number]) on 09/29/2023 9:46:19 AM     Risk Assessment/Calculations:    CHA2DS2-VASc Score = 2   This indicates a 2.2% annual risk of stroke. The patient's score is based upon: CHF History: 0 HTN History: 0 Diabetes History: 0 Stroke History: 0 Vascular Disease History: 0 Age Score: 1 Gender Score: 1             Physical Exam:   VS:  BP 110/70 (BP Location: Right Arm, Patient Position: Sitting, Cuff Size: Normal)   Pulse (!) 53   Ht 5\' 9"  (1.753 m)   Wt 163 lb (73.9 kg)   SpO2 97%   BMI 24.07 kg/m    Wt Readings from Last 3 Encounters:  09/29/23 163 lb (73.9 kg)  08/27/23 164 lb (74.4 kg)  07/07/23 164 lb (74.4  kg)     GEN: Well nourished, well developed in no acute distress NECK: No JVD; No carotid bruits CARDIAC: Regular rate and rhythm, no murmurs, rubs, gallops RESPIRATORY:  Clear to auscultation without rales, wheezing or rhonchi  ABDOMEN: Soft, non-tender, non-distended EXTREMITIES:  No edema; No deformity   ASSESSMENT AND PLAN:    1.  Paroxysmal atrial fibrillation: Post ablation 05/25/2020.  Currently on flecainide, and metoprolol.  She remains in sinus rhythm.  She has had no further episodes of atrial fibrillation.  2.  Secondary hypercoagulable state: Currently on Eliquis for atrial fibrillation  3.  High risk medication monitoring: Currently on flecainide.  QRS remains narrow.  Follow up with Dr. Elberta Fortis in 6 months  Signed, Cain Fitzhenry Jorja Loa, MD

## 2023-09-29 NOTE — Addendum Note (Signed)
 Addended by: Baird Lyons on: 09/29/2023 10:12 AM   Modules accepted: Orders

## 2023-09-30 LAB — CBC
Hematocrit: 36.9 % (ref 34.0–46.6)
Hemoglobin: 12.3 g/dL (ref 11.1–15.9)
MCH: 31.5 pg (ref 26.6–33.0)
MCHC: 33.3 g/dL (ref 31.5–35.7)
MCV: 94 fL (ref 79–97)
Platelets: 235 10*3/uL (ref 150–450)
RBC: 3.91 x10E6/uL (ref 3.77–5.28)
RDW: 12.3 % (ref 11.7–15.4)
WBC: 6.8 10*3/uL (ref 3.4–10.8)

## 2023-09-30 LAB — BASIC METABOLIC PANEL WITH GFR
BUN/Creatinine Ratio: 19 (ref 12–28)
BUN: 17 mg/dL (ref 8–27)
CO2: 27 mmol/L (ref 20–29)
Calcium: 10 mg/dL (ref 8.7–10.3)
Chloride: 102 mmol/L (ref 96–106)
Creatinine, Ser: 0.91 mg/dL (ref 0.57–1.00)
Glucose: 88 mg/dL (ref 70–99)
Potassium: 4.7 mmol/L (ref 3.5–5.2)
Sodium: 140 mmol/L (ref 134–144)
eGFR: 67 mL/min/{1.73_m2} (ref >59)

## 2023-10-26 ENCOUNTER — Other Ambulatory Visit

## 2023-10-27 LAB — T3, FREE: T3, Free: 3.2 pg/mL (ref 2.3–4.2)

## 2023-10-27 LAB — T4, FREE: Free T4: 1.1 ng/dL (ref 0.8–1.8)

## 2023-10-27 LAB — TSH: TSH: 1.94 m[IU]/L (ref 0.40–4.50)

## 2023-11-02 ENCOUNTER — Ambulatory Visit: Admitting: Internal Medicine

## 2023-11-03 ENCOUNTER — Encounter: Payer: Self-pay | Admitting: Internal Medicine

## 2023-11-03 ENCOUNTER — Ambulatory Visit (INDEPENDENT_AMBULATORY_CARE_PROVIDER_SITE_OTHER): Admitting: Internal Medicine

## 2023-11-03 VITALS — BP 114/80 | HR 56 | Temp 98.5°F | Ht 69.0 in | Wt 163.0 lb

## 2023-11-03 DIAGNOSIS — Z853 Personal history of malignant neoplasm of breast: Secondary | ICD-10-CM

## 2023-11-03 DIAGNOSIS — E0590 Thyrotoxicosis, unspecified without thyrotoxic crisis or storm: Secondary | ICD-10-CM | POA: Diagnosis not present

## 2023-11-03 DIAGNOSIS — I480 Paroxysmal atrial fibrillation: Secondary | ICD-10-CM

## 2023-11-03 DIAGNOSIS — R7303 Prediabetes: Secondary | ICD-10-CM | POA: Diagnosis not present

## 2023-11-03 DIAGNOSIS — Z13220 Encounter for screening for lipoid disorders: Secondary | ICD-10-CM

## 2023-11-03 DIAGNOSIS — Z1211 Encounter for screening for malignant neoplasm of colon: Secondary | ICD-10-CM

## 2023-11-03 LAB — LIPID PANEL
Cholesterol: 208 mg/dL — ABNORMAL HIGH (ref 0–200)
HDL: 76 mg/dL (ref >39.00)
LDL Cholesterol: 109 mg/dL — ABNORMAL HIGH (ref 0–99)
NonHDL: 132.32
Total CHOL/HDL Ratio: 3
Triglycerides: 117 mg/dL (ref 0.0–149.0)
VLDL: 23.4 mg/dL (ref 0.0–40.0)

## 2023-11-03 LAB — HEMOGLOBIN A1C: Hgb A1c MFr Bld: 5.7 % (ref 4.6–6.5)

## 2023-11-03 NOTE — Assessment & Plan Note (Signed)
 Taking eliquis  5 mg BID for stroke prevention and on metoprolol  and flecainide  sounds regular today. EKG not done. Continue.

## 2023-11-03 NOTE — Patient Instructions (Addendum)
 Voltaren gel you can use on the hand or the knee.   Tetanus booster, RSV vaccine you can get as well before the fall.

## 2023-11-03 NOTE — Progress Notes (Signed)
   Subjective:   Patient ID: Crystal [name], female    DOB: 1950/09/18, 73 y.o.   MRN: [id number]  HPI The patient is a 73 YO female coming in for medical management (see A/P for details).   Review of Systems  Constitutional: Negative.   HENT: Negative.    Eyes: Negative.   Respiratory:  Negative for cough, chest tightness and shortness of breath.   Cardiovascular:  Negative for chest pain, palpitations and leg swelling.  Gastrointestinal:  Negative for abdominal distention, abdominal pain, constipation, diarrhea, nausea and vomiting.  Musculoskeletal: Negative.   Skin: Negative.   Neurological: Negative.   Psychiatric/Behavioral: Negative.      Objective:  Physical Exam Constitutional:      Appearance: She is well-developed.  HENT:     Head: Normocephalic and atraumatic.  Cardiovascular:     Rate and Rhythm: Normal rate and regular rhythm.  Pulmonary:     Effort: Pulmonary effort is normal. No respiratory distress.     Breath sounds: Normal breath sounds. No wheezing or rales.  Abdominal:     General: Bowel sounds are normal. There is no distension.     Palpations: Abdomen is soft.     Tenderness: There is no abdominal tenderness. There is no rebound.  Musculoskeletal:     Cervical back: Normal range of motion.  Skin:    General: Skin is warm and dry.  Neurological:     Mental Status: She is alert and oriented to person, place, and time.     Coordination: Coordination normal.     Vitals:   11/03/23 1031  BP: 114/80  Pulse: (!) 56  Temp: 98.5 F (36.9 C)  TempSrc: Oral  SpO2: 97%  Weight: 163 lb (73.9 kg)  Height: 5\' 9"  (1.753 m)    Assessment & Plan:

## 2023-11-03 NOTE — Assessment & Plan Note (Signed)
 Seeing endo and gets regular labs. Taking methimazole  and labs in range.

## 2023-11-03 NOTE — Assessment & Plan Note (Signed)
 Checking HgA1c and adjust as needed.

## 2023-11-03 NOTE — Assessment & Plan Note (Signed)
 Still gets yearly mammogram.

## 2023-11-18 ENCOUNTER — Ambulatory Visit (INDEPENDENT_AMBULATORY_CARE_PROVIDER_SITE_OTHER)

## 2023-11-18 VITALS — Ht 69.0 in | Wt 163.0 lb

## 2023-11-18 DIAGNOSIS — Z0000 Encounter for general adult medical examination without abnormal findings: Secondary | ICD-10-CM | POA: Diagnosis not present

## 2023-11-18 NOTE — Progress Notes (Signed)
 Subjective:   Crystal [name] is a 73 y.o. who presents for a Medicare Wellness preventive visit.  As a reminder, Annual Wellness Visits don't include a physical exam, and some assessments may be limited, especially if this visit is performed virtually. We may recommend an in-person follow-up visit with your provider if needed.  Visit Complete: Virtual I connected with  Adiva B Boggan on 11/18/23 by a audio enabled telemedicine application and verified that I am speaking with the correct person using two identifiers.  Patient Location: Home  Provider Location: Home Office  I discussed the limitations of evaluation and management by telemedicine. The patient expressed understanding and agreed to proceed.  Vital Signs: Because this visit was a virtual/telehealth visit, some criteria may be missing or patient reported. Any vitals not documented were not able to be obtained and vitals that have been documented are patient reported.   Persons Participating in Visit: Patient.  AWV Questionnaire: Yes: Patient Medicare AWV questionnaire was completed by the patient on 11/11/2023; I have confirmed that all information answered by patient is correct and no changes since this date.  Cardiac Risk Factors include: advanced age (>48men, >66 women);Other (see comment), Risk factor comments: Hyperthyroidism     Objective:     Today's Vitals   11/18/23 0806  Weight: 163 lb (73.9 kg)  Height: 5\' 9"  (1.753 m)   Body mass index is 24.07 kg/m.     11/18/2023    8:23 AM 07/07/2023    8:45 AM 07/04/2022    8:39 AM 09/24/2021    1:16 PM 07/03/2021    8:51 AM 07/17/2020   12:41 PM 05/25/2020    9:04 AM  Advanced Directives  Does Patient Have a Medical Advance Directive? Yes Yes Yes Yes Yes Yes Yes  Type of Estate agent of [place];Living will Healthcare Power of [place];Living will Healthcare Power of [place];Living will;Out of facility DNR (pink MOST or yellow form)  Living will;Healthcare Power of [employer] of [place];Living will;Out of facility DNR (pink MOST or yellow form) Healthcare Power of [place];Living will Healthcare Power of [place];Living will  Does patient want to make changes to medical advance directive?    No - Patient declined     Copy of Healthcare Power of Attorney in Chart? No - copy requested   No - copy requested  No - copy requested   Would patient like information on creating a medical advance directive?    No - Patient declined       Current Medications (verified) Outpatient Encounter Medications as of 11/18/2023  Medication Sig   acetaminophen  (TYLENOL ) 325 MG tablet Take 325 mg by mouth as needed for moderate pain or headache.   apixaban  (ELIQUIS ) 5 MG TABS tablet take one tablet by mouth twice daily   Calcium Carbonate-Vitamin D  600-400 MG-UNIT tablet Take 1 tablet by mouth daily.   Cholecalciferol (VITAMIN D3) 1000 UNITS CAPS Take 1,000 Units by mouth daily.   flecainide  (TAMBOCOR ) 100 MG tablet Take 1 tablet (100 mg total) by mouth 2 (two) times daily.   methimazole  (TAPAZOLE ) 5 MG tablet Take 0.5 tablets (2.5 mg total) by mouth daily.   metoprolol  succinate (TOPROL -XL) 50 MG 24 hr tablet Take 1 tablet (50 mg total) by mouth daily. Take with or immediately following a meal.   Multiple Vitamin (MULTIVITAMIN) tablet Take 1 tablet by mouth daily.   Polyethyl Glycol-Propyl Glycol (SYSTANE OP) Place 1 drop into both eyes daily as needed (For dryness.).  Probiotic Product (FORTIFY DAILY PROBIOTIC PO) Take 1 capsule by mouth daily.    No facility-administered encounter medications on file as of 11/18/2023.    Allergies (verified) Patient has no known allergies.   History: Past Medical History:  Diagnosis Date   BACK PAIN, LUMBAR    Breast cancer of lower-outer quadrant of right female breast (HCC) 10/24/2014   Breast cancer, right breast    FIBROADENOMA, BREAST 2006   HPV in female    Hyperthyroidism     Neuromuscular disorder (HCC)    Peripheral Neuropathy   New onset atrial fibrillation (HCC) 01/06/2020   Osteoporosis    Osteopenia   Palpitations 01/06/2020   PARESTHESIA    toes   PONV (postoperative nausea and vomiting)    Radiation 11/23/14-12/25/14   right breast 42.72 gray, lumpectomy cavity boosted to 54.72 gray   Routine health maintenance 03/30/2013   Colonoscopy - for colonoscopy (Sept '14)  Immunization  Tdap Oct '14    Spider veins of both lower extremities 05/09/2019   Wears glasses    Past Surgical History:  Procedure Laterality Date   ATRIAL FIBRILLATION ABLATION N/A 05/25/2020   Procedure: ATRIAL FIBRILLATION ABLATION;  Surgeon: Lei Pump, MD;  Location: [facility];  Service: Cardiovascular;  Laterality: N/A;   atrophic vaginitis     BREAST LUMPECTOMY WITH RADIOACTIVE SEED LOCALIZATION Right 10/18/2014   Procedure: BREAST LUMPECTOMY WITH RADIOACTIVE SEED LOCALIZATION;  Surgeon: Oza Blumenthal, MD;  Location: [facility];  Service: General;  Laterality: Right;   BREAST SURGERY     Cancer   COLONOSCOPY  09/05/2013   D & C and hysteroscopy  11/29/1998   for uterine polyp   DILATATION & CURRETTAGE/HYSTEROSCOPY WITH RESECTOCOPE N/A 05/30/2015   Procedure: DILATATION & CURETTAGE/HYSTEROSCOPY WITH RESECTOCOPE;  Surgeon: Percy Bracken, MD;  Location: [facility];  Service: Gynecology;  Laterality: N/A;   DILATION AND CURETTAGE OF UTERUS     Sterotactix needle biopsy  06/30/1998   breast calcification   Family History  Problem Relation Age of Onset   Hypothyroidism Mother    GER disease Mother    Osteoporosis Mother    Hypertension Mother    Cancer Mother        hodgkins lympoma, uterine cancer   Arrhythmia Mother    Vision loss Mother    Hyperlipidemia Father    Hypertension Father    Diabetes Father    Coronary artery disease Father    Alzheimer's disease Brother    Coronary artery disease Maternal Grandfather    COPD Neg Hx     Esophageal cancer Neg Hx    Rectal cancer Neg Hx    Stomach cancer Neg Hx    Social History   Socioeconomic History   Marital status: Married    Spouse name: Lavonia Powers   Number of children: 2   Years of education: 16   Highest education level: Bachelor's degree (e.g., BA, AB, BS)  Occupational History   Occupation: PROPERTY MGR    Employer: [employer]  Tobacco Use   Smoking status: Never   Smokeless tobacco: Never   Tobacco comments:    Never smoked  Vaping Use   Vaping status: Never Used  Substance and Sexual Activity   Alcohol use: Yes    Comment: May have 1 beer a month   Drug use: No   Sexual activity: Yes    Partners: Male    Birth control/protection: Post-menopausal    Comment: 1st intercourse- 21,  partners- less than 5  Other Topics Concern   Not on file  Social History Narrative   HSG, [place] [place], Married '78,  1 son '81, 1 daughter '84, 1 grandson '12.    Work - Geneticist, molecular.  Lives with husband. ACP - discussed with patient.    Refer - [email or web address]   Highest level of education:  BS   Right handed   Two story home      Lives with husband/2025   Social Drivers of Health   Financial Resource Strain: Low Risk  (11/18/2023)   Overall Financial Resource Strain (CARDIA)    Difficulty of Paying Living Expenses: Not very hard  Food Insecurity: No Food Insecurity (11/18/2023)   Hunger Vital Sign    Worried About Running Out of Food in the Last Year: Never true    Ran Out of Food in the Last Year: Never true  Transportation Needs: No Transportation Needs (11/18/2023)   PRAPARE - Administrator, Civil Service (Medical): No    Lack of Transportation (Non-Medical): No  Physical Activity: Insufficiently Active (11/18/2023)   Exercise Vital Sign    Days of Exercise per Week: 1 day    Minutes of Exercise per Session: 60 min  Stress: No Stress Concern Present (11/18/2023)   [employer] of  Occupational Health - Occupational Stress Questionnaire    Feeling of Stress : Only a little  Social Connections: Socially Integrated (11/18/2023)   Social Connection and Isolation Panel [NHANES]    Frequency of Communication with Friends and Family: More than three times a week    Frequency of Social Gatherings with Friends and Family: More than three times a week    Attends Religious Services: More than 4 times per year    Active Member of [employer] or Organizations: Yes    Attends Banker Meetings: Never    Marital Status: Married    Tobacco Counseling Counseling given: Not Answered Tobacco comments: Never smoked    Clinical Intake:  Pre-visit preparation completed: Yes  Pain : No/denies pain     BMI - recorded: 24.07 Nutritional Status: BMI of 19-24  Normal Nutritional Risks: None Diabetes: No  Lab Results  Component Value Date   HGBA1C 5.7 11/03/2023   HGBA1C 5.9 09/24/2021   HGBA1C 5.7 09/04/2020     How often do you need to have someone help you when you read instructions, pamphlets, or other written materials from your doctor or pharmacy?: 1 - Never  Interpreter Needed?: No  Information entered by :: Jantzen Pilger, RMA   Activities of Daily Living     11/11/2023    3:54 PM 10/30/2023   10:23 AM  In your present state of health, do you have any difficulty performing the following activities:  Hearing? 0 0  Vision? 0 0  Difficulty concentrating or making decisions?  0  Walking or climbing stairs? 0 0  Dressing or bathing? 0 0  Doing errands, shopping? 0 0  Preparing Food and eating ? N N  Using the Toilet? N N  In the past six months, have you accidently leaked urine? N Y  Do you have problems with loss of bowel control? N N  Managing your Medications? N N  Managing your Finances? N N  Housekeeping or managing your Housekeeping? N N    Patient Care Team: Adelia Homestead, MD as PCP - General (Internal Medicine) Arnoldo Lapping, MD as  PCP - Cardiology (Cardiology) Lawana Pray,  Babetta Lesch, MD as PCP - Electrophysiology (Cardiology) Dama Duffel, NP as Nurse Practitioner (Nurse Practitioner) Percy Bracken, MD as Consulting Physician (Obstetrics and Gynecology) Patel, Donika K, DO as Consulting Physician (Neurology) Pa, [facility] as Consulting Physician (Ophthalmology)  Indicate any recent Medical Services you may have received from other than [facility] providers in the past year (date may be approximate).     Assessment:    This is a routine wellness examination for [place].  Hearing/Vision screen Hearing Screening - Comments:: Denies hearing difficulties   Vision Screening - Comments:: Wears eyeglasses/ Dr. Joanne Muckle   Goals Addressed               This Visit's Progress     Patient Stated (pt-stated)        To live/2025       Depression Screen     11/18/2023    8:26 AM 01/28/2023   10:20 AM 09/29/2022   10:58 AM 07/16/2022    2:57 PM 03/12/2022   10:07 AM 10/17/2021    8:12 AM 09/24/2021    1:03 PM  PHQ 2/9 Scores  PHQ - 2 Score 0 0 0 0 0 0 0  PHQ- 9 Score 1 0   0      Fall Risk     11/11/2023    3:54 PM 11/03/2023   10:44 AM 10/30/2023   10:23 AM 07/07/2023    8:45 AM 02/11/2023    1:15 PM  Fall Risk   Falls in the past year? 0 0 0 0 0  Number falls in past yr: 0 0  0   Injury with Fall? 0 0  0   Follow up Falls evaluation completed;Falls prevention discussed Falls evaluation completed  Falls evaluation completed     MEDICARE RISK AT HOME:  Medicare Risk at Home Any stairs in or around the home?: (Patient-Rptd) Yes If so, are there any without handrails?: (Patient-Rptd) No Home free of loose throw rugs in walkways, pet beds, electrical cords, etc?: (Patient-Rptd) No Adequate lighting in your home to reduce risk of falls?: (Patient-Rptd) Yes Life alert?: (Patient-Rptd) No Use of a cane, walker or w/c?: (Patient-Rptd) No Grab bars in the bathroom?: (Patient-Rptd)  No Shower chair or bench in shower?: (Patient-Rptd) No Elevated toilet seat or a handicapped toilet?: (Patient-Rptd) No  TIMED UP AND GO:  Was the test performed?  No  Cognitive Function: Declined/Normal: No cognitive concerns noted by patient or family. Patient alert, oriented, able to answer questions appropriately and recall recent events. No signs of memory loss or confusion.        09/29/2022   10:49 AM 09/24/2021    1:39 PM  6CIT Screen  What Year? 0 points 0 points  What month? 0 points 0 points  What time? 0 points 0 points  Count back from 20 0 points 0 points  Months in reverse 0 points 0 points  Repeat phrase 0 points 0 points  Total Score 0 points 0 points    Immunizations Immunization History  Administered Date(s) Administered   Fluad Quad(high Dose 65+) 05/09/2019, 05/29/2020, 02/28/2021, 03/12/2022   Influenza, High Dose Seasonal PF 06/02/2017, 03/31/2018   Influenza,inj,Quad PF,6+ Mos 02/27/2016   Moderna Covid-19 Vaccine Bivalent Booster 52yrs & up 05/21/2021   Moderna Sars-Covid-2 Vaccination 08/05/2019, 09/07/2019, 05/16/2020   Pneumococcal Conjugate-13 04/07/2018   Pneumococcal Polysaccharide-23 05/09/2019   Td 07/13/2002   Tdap 03/30/2013   Zoster Recombinant(Shingrix) 02/02/2022, 08/13/2022   Zoster, Live 07/20/2014  Screening Tests Health Maintenance  Topic Date Due   COVID-19 Vaccine (5 - 2024-25 season) 03/01/2023   DTaP/Tdap/Td (3 - Td or Tdap) 03/31/2023   Colonoscopy  09/06/2023   Medicare Annual Wellness (AWV)  09/29/2023   INFLUENZA VACCINE  01/29/2024   MAMMOGRAM  03/31/2025   Pneumonia Vaccine 49+ Years old  Completed   DEXA SCAN  Completed   Hepatitis C Screening  Completed   Zoster Vaccines- Shingrix  Completed   HPV VACCINES  Aged Out   Meningococcal B Vaccine  Aged Out    Health Maintenance  Health Maintenance Due  Topic Date Due   COVID-19 Vaccine (5 - 2024-25 season) 03/01/2023   DTaP/Tdap/Td (3 - Td or Tdap)  03/31/2023   Colonoscopy  09/06/2023   Medicare Annual Wellness (AWV)  09/29/2023   Health Maintenance Items Addressed: See Nurse Notes  Additional Screening:  Vision Screening: Recommended annual ophthalmology exams for early detection of glaucoma and other disorders of the eye.  Dental Screening: Recommended annual dental exams for proper oral hygiene  Community Resource Referral / Chronic Care Management: CRR required this visit?  No   CCM required this visit?  No   Plan:    I have personally reviewed and noted the following in the patient's chart:   Medical and social history Use of alcohol, tobacco or illicit drugs  Current medications and supplements including opioid prescriptions. Patient is not currently taking opioid prescriptions. Functional ability and status Nutritional status Physical activity Advanced directives List of other physicians Hospitalizations, surgeries, and ER visits in previous 12 months Vitals Screenings to include cognitive, depression, and falls Referrals and appointments  In addition, I have reviewed and discussed with patient certain preventive protocols, quality metrics, and best practice recommendations. A written personalized care plan for preventive services as well as general preventive health recommendations were provided to patient.   Jerimey Burridge L Jamelle Goldston, CMA   11/18/2023   After Visit Summary: (MyChart) Due to this being a telephonic visit, the after visit summary with patients personalized plan was offered to patient via MyChart   Notes: Please refer to Routing Comments.

## 2023-11-18 NOTE — Patient Instructions (Signed)
 Crystal [name] , Thank you for taking time out of your busy schedule to complete your Annual Wellness Visit with me. I enjoyed our conversation and look forward to speaking with you again next year. I, as well as your care team,  appreciate your ongoing commitment to your health goals. Please review the following plan we discussed and let me know if I can assist you in the future. Your Game plan/ To Do List    Referrals: If you haven't heard from the office you've been referred to, please reach out to them at the phone provided.  [facility], [address] [place] [address]rd Floor,  [phone].  Please call to get scheduled for a colonoscopy. Follow up Visits: Next Medicare AWV with our clinical staff: 11/18/2024.   Have you seen your provider in the last 6 months (3 months if uncontrolled diabetes)? Yes Next Office Visit with your provider: Patient had a visit on 11/03/2023.  Clinician Recommendations:  Aim for 30 minutes of exercise or brisk walking, 6-8 glasses of water, and 5 servings of fruits and vegetables each day. You are due for a Tetanus vaccine.        This is a list of the screening recommended for you and due dates:  Health Maintenance  Topic Date Due   COVID-19 Vaccine (5 - 2024-25 season) 03/01/2023   DTaP/Tdap/Td vaccine (3 - Td or Tdap) 03/31/2023   Colon Cancer Screening  09/06/2023   Medicare Annual Wellness Visit  09/29/2023   Flu Shot  01/29/2024   Mammogram  03/31/2025   Pneumonia Vaccine  Completed   DEXA scan (bone density measurement)  Completed   Hepatitis C Screening  Completed   Zoster (Shingles) Vaccine  Completed   HPV Vaccine  Aged Out   Meningitis B Vaccine  Aged Out    Advanced directives: (Copy Requested) Please bring a copy of your health care power of attorney and living will to the office to be added to your chart at your convenience. You can mail to [facility] [address] [place], [place] [address] or email to  [email or web address] Advance Care Planning is important because it:  [x]  Makes sure you receive the medical care that is consistent with your values, goals, and preferences  [x]  It provides guidance to your family and loved ones and reduces their decisional burden about whether or not they are making the right decisions based on your wishes.  Follow the link provided in your after visit summary or read over the paperwork we have mailed to you to help you started getting your Advance Directives in place. If you need assistance in completing these, please reach out to us  so that we can help you!  See attachments for Preventive Care and Fall Prevention Tips.

## 2023-12-15 ENCOUNTER — Other Ambulatory Visit: Payer: Self-pay

## 2023-12-15 DIAGNOSIS — I480 Paroxysmal atrial fibrillation: Secondary | ICD-10-CM

## 2023-12-15 MED ORDER — APIXABAN 5 MG PO TABS
5.0000 mg | ORAL_TABLET | Freq: Two times a day (BID) | ORAL | 1 refills | Status: DC
Start: 2023-12-15 — End: 2023-12-22

## 2023-12-15 NOTE — Telephone Encounter (Signed)
 Prescription refill request for Eliquis  received. Indication:AFIB Last office visit:4/25 Scr:0.91  4/25 Age: 73 Weight:73.9  kg  Prescription refilled

## 2023-12-22 ENCOUNTER — Other Ambulatory Visit: Payer: Self-pay | Admitting: Cardiology

## 2023-12-22 DIAGNOSIS — I480 Paroxysmal atrial fibrillation: Secondary | ICD-10-CM

## 2023-12-22 NOTE — Telephone Encounter (Signed)
 Prescription refill request for Eliquis  received. Indication:afib Last office visit:4/25 Scr:0.91  4/25 Age: 73 Weight:73.9  kg  Prescription refilled

## 2024-01-05 ENCOUNTER — Encounter: Payer: Self-pay | Admitting: Physician Assistant

## 2024-01-05 ENCOUNTER — Telehealth: Payer: Self-pay

## 2024-01-05 ENCOUNTER — Ambulatory Visit (INDEPENDENT_AMBULATORY_CARE_PROVIDER_SITE_OTHER): Admitting: Physician Assistant

## 2024-01-05 VITALS — BP 110/66 | HR 60 | Ht 70.0 in | Wt 164.2 lb

## 2024-01-05 DIAGNOSIS — K5900 Constipation, unspecified: Secondary | ICD-10-CM

## 2024-01-05 DIAGNOSIS — K5909 Other constipation: Secondary | ICD-10-CM

## 2024-01-05 DIAGNOSIS — Z1211 Encounter for screening for malignant neoplasm of colon: Secondary | ICD-10-CM

## 2024-01-05 DIAGNOSIS — Z7901 Long term (current) use of anticoagulants: Secondary | ICD-10-CM

## 2024-01-05 MED ORDER — NA SULFATE-K SULFATE-MG SULF 17.5-3.13-1.6 GM/177ML PO SOLN: 1.0000 | Freq: Once | ORAL | 0 refills | Status: AC

## 2024-01-05 NOTE — Patient Instructions (Addendum)
 Please purchase Metamucil over the counter. Take daily.  If daily fiber doesn't work than recommend miralax daily.   You have been scheduled for a colonoscopy. Please follow written instructions given to you at your visit today.   If you use inhalers (even only as needed), please bring them with you on the day of your procedure.  DO NOT TAKE 7 DAYS PRIOR TO TEST- Trulicity (dulaglutide) Ozempic, Wegovy (semaglutide) Mounjaro (tirzepatide) Bydureon Bcise (exanatide extended release)  DO NOT TAKE 1 DAY PRIOR TO YOUR TEST Rybelsus (semaglutide) Adlyxin (lixisenatide) Victoza (liraglutide) Byetta (exanatide) ___________________________________________________________________________   Rosine will be contacted by our office prior to your procedure for directions on holding your Eliquis .  If you do not hear from our office 1 week prior to your scheduled procedure, please call [phone] to discuss.    _______________________________________________________  If your blood pressure at your visit was 140/90 or greater, please contact your primary care physician to follow up on this.  _______________________________________________________  If you are age 68 or older, your body mass index should be between 23-30. Your Body mass index is 23.57 kg/m. If this is out of the aforementioned range listed, please consider follow up with your Primary Care Provider.  If you are age 2 or younger, your body mass index should be between 19-25. Your Body mass index is 23.57 kg/m. If this is out of the aformentioned range listed, please consider follow up with your Primary Care Provider.   ________________________________________________________  The [facility] providers would like to encourage you to use MYCHART to communicate with providers for non-urgent requests or questions.  Due to long hold times on the telephone, sending your provider a message by [facility] may be a faster and more efficient way to  get a response.  Please allow 48 business hours for a response.  Please remember that this is for non-urgent requests.  _______________________________________________________

## 2024-01-05 NOTE — Telephone Encounter (Signed)
  [facility] Pre-operative Risk Assessment     Request for surgical clearance:     Endoscopy Procedure  What type of surgery is being performed?     Colonoscopy  When is this surgery scheduled?     02/02/24  What type of clearance is required ?   Pharmacy  Are there any medications that need to be held prior to surgery and how long? Eliquis , 2 days  Practice name and name of physician performing surgery?      [facility]  What is your office phone and fax number?      Phone- [phone]  Fax- [phone]  Anesthesia type (None, local, MAC, general) ?       MAC   Please route your response to Corean [place], [facility]

## 2024-01-05 NOTE — Progress Notes (Signed)
 Chief Complaint: Discuss colonoscopy  HPI:    Crystal [name] is a 73 year old female with a past medical history as listed below including A-fib on Eliquis  (01/31/2020 echo was normal), previously known to Dr. Debrah, who was referred to me by Rollene Almarie LABOR, * for consideration of a colonoscopy.       09/05/2013 colonoscopy with internal hemorrhoids and otherwise normal.  Repeat recommended 10 years.    10/26/2023 CBC, BMP, TSH all normal.    Today, the patient presents to clinic and tells me that she is doing well.  She knows she is due for a colonoscopy as it has been over 10 years.  She does tell me that she battles with chronic constipation at times.  Typically she eats prunes in the morning and occasionally she will use Senokot or Metamucil.  The Metamucil actually works quite well for her when she takes it.  Also eats a high-fiber cereal.  This is maybe slightly worse than it has been over the past many years, but still relieved quite easily.  She does have some questions about this.    Currently on Eliquis  for A-fib.  Has had to hold this before.    Denies fever, chills or weight loss.  Past Medical History:  Diagnosis Date   BACK PAIN, LUMBAR    Breast cancer of lower-outer quadrant of right female breast (HCC) 10/24/2014   Breast cancer, right breast    FIBROADENOMA, BREAST 2006   HPV in female    Hyperthyroidism    Neuromuscular disorder (HCC)    Peripheral Neuropathy   New onset atrial fibrillation (HCC) 01/06/2020   Osteoporosis    Osteopenia   Palpitations 01/06/2020   PARESTHESIA    toes   PONV (postoperative nausea and vomiting)    Radiation 11/23/14-12/25/14   right breast 42.72 gray, lumpectomy cavity boosted to 54.72 gray   Routine health maintenance 03/30/2013   Colonoscopy - for colonoscopy (Sept '14)  Immunization  Tdap Oct '14    Spider veins of both lower extremities 05/09/2019   Wears glasses     Past Surgical History:  Procedure Laterality Date    ATRIAL FIBRILLATION ABLATION N/A 05/25/2020   Procedure: ATRIAL FIBRILLATION ABLATION;  Surgeon: Inocencio Soyla Lunger, MD;  Location: [facility];  Service: Cardiovascular;  Laterality: N/A;   atrophic vaginitis     BREAST LUMPECTOMY WITH RADIOACTIVE SEED LOCALIZATION Right 10/18/2014   Procedure: BREAST LUMPECTOMY WITH RADIOACTIVE SEED LOCALIZATION;  Surgeon: Vicenta Poli, MD;  Location: [facility];  Service: General;  Laterality: Right;   BREAST SURGERY     Cancer   COLONOSCOPY  09/05/2013   D & C and hysteroscopy  11/29/1998   for uterine polyp   DILATATION & CURRETTAGE/HYSTEROSCOPY WITH RESECTOCOPE N/A 05/30/2015   Procedure: DILATATION & CURETTAGE/HYSTEROSCOPY WITH RESECTOCOPE;  Surgeon: Percilla Burly, MD;  Location: [facility];  Service: Gynecology;  Laterality: N/A;   DILATION AND CURETTAGE OF UTERUS     Sterotactix needle biopsy  06/30/1998   breast calcification    Current Outpatient Medications  Medication Sig Dispense Refill   acetaminophen  (TYLENOL ) 325 MG tablet Take 325 mg by mouth as needed for moderate pain or headache.     apixaban  (ELIQUIS ) 5 MG TABS tablet take one tablet by mouth twice daily 180 tablet 1   Calcium Carbonate-Vitamin D  600-400 MG-UNIT tablet Take 1 tablet by mouth daily.     Cholecalciferol (VITAMIN D3) 1000 UNITS CAPS Take 1,000 Units by mouth daily.  flecainide  (TAMBOCOR ) 100 MG tablet Take 1 tablet (100 mg total) by mouth 2 (two) times daily. 60 tablet 7   methimazole  (TAPAZOLE ) 5 MG tablet Take 0.5 tablets (2.5 mg total) by mouth daily.     metoprolol  succinate (TOPROL -XL) 50 MG 24 hr tablet Take 1 tablet (50 mg total) by mouth daily. Take with or immediately following a meal. 90 tablet 3   Multiple Vitamin (MULTIVITAMIN) tablet Take 1 tablet by mouth daily.     Polyethyl Glycol-Propyl Glycol (SYSTANE OP) Place 1 drop into both eyes daily as needed (For dryness.).      Probiotic Product (FORTIFY DAILY PROBIOTIC PO) Take 1  capsule by mouth daily.      No current facility-administered medications for this visit.    Allergies as of 01/05/2024   (No Known Allergies)    Family History  Problem Relation Age of Onset   Hypothyroidism Mother    GER disease Mother    Osteoporosis Mother    Hypertension Mother    Cancer Mother        hodgkins lympoma, uterine cancer   Arrhythmia Mother    Vision loss Mother    Hyperlipidemia Father    Hypertension Father    Diabetes Father    Coronary artery disease Father    Alzheimer's disease Brother    Coronary artery disease Maternal Grandfather    COPD Neg Hx    Esophageal cancer Neg Hx    Rectal cancer Neg Hx    Stomach cancer Neg Hx     Social History   Socioeconomic History   Marital status: Married    Spouse name: Oneil   Number of children: 2   Years of education: 16   Highest education level: Bachelor's degree (e.g., BA, AB, BS)  Occupational History   Occupation: PROPERTY MGR    Employer: [employer]  Tobacco Use   Smoking status: Never   Smokeless tobacco: Never   Tobacco comments:    Never smoked  Vaping Use   Vaping status: Never Used  Substance and Sexual Activity   Alcohol use: Yes    Comment: May have 1 beer a month   Drug use: No   Sexual activity: Yes    Partners: Male    Birth control/protection: Post-menopausal    Comment: 1st intercourse- 21, partners- less than 5  Other Topics Concern   Not on file  Social History Narrative   HS[place], [place], Married '78,  1 son '81, 1 daughter '84, 1 grandson '12.    Work - Geneticist, molecular.  Lives with husband. ACP - discussed with patient.    Refer - [email or web address]   Highest level of education:  BS   Right handed   Two story home      Lives with husband/2025   Social Drivers of Health   Financial Resource Strain: Low Risk  (11/18/2023)   Overall Financial Resource Strain (CARDIA)    Difficulty of Paying Living Expenses: Not  very hard  Food Insecurity: No Food Insecurity (11/18/2023)   Hunger Vital Sign    Worried About Running Out of Food in the Last Year: Never true    Ran Out of Food in the Last Year: Never true  Transportation Needs: No Transportation Needs (11/18/2023)   PRAPARE - Administrator, Civil Service (Medical): No    Lack of Transportation (Non-Medical): No  Physical Activity: Insufficiently Active (11/18/2023)   Exercise Vital Sign  Days of Exercise per Week: 1 day    Minutes of Exercise per Session: 60 min  Stress: No Stress Concern Present (11/18/2023)   [employer] of Occupational Health - Occupational Stress Questionnaire    Feeling of Stress : Only a little  Social Connections: Socially Integrated (11/18/2023)   Social Connection and Isolation Panel    Frequency of Communication with Friends and Family: More than three times a week    Frequency of Social Gatherings with Friends and Family: More than three times a week    Attends Religious Services: More than 4 times per year    Active Member of [employer] or Organizations: Yes    Attends Banker Meetings: Never    Marital Status: Married  Catering manager Violence: Not At Risk (11/18/2023)   Humiliation, Afraid, Rape, and Kick questionnaire    Fear of Current or Ex-Partner: No    Emotionally Abused: No    Physically Abused: No    Sexually Abused: No    Review of Systems:    Constitutional: No weight loss, fever or chills Skin: No rash or itching Cardiovascular: No chest pain Respiratory: No SOB  Gastrointestinal: See HPI and otherwise negative Genitourinary: No dysuria or change in urinary frequency Neurological: No headache, dizziness or syncope Musculoskeletal: No new muscle or joint pain Hematologic: No bleeding or bruising Psychiatric: No history of depression or anxiety   Physical Exam:  Vital signs: BP 110/66 (BP Location: Left Arm, Patient Position: Sitting, Cuff Size: Normal)   Pulse 60    Ht 5' 10 (1.778 m)   Wt 164 lb 4 oz (74.5 kg)   BMI 23.57 kg/m    Constitutional:   Pleasant elderly Caucasian female appears to be in NAD, Well developed, Well nourished, alert and cooperative Head:  Normocephalic and atraumatic. Eyes:   PEERL, EOMI. No icterus. Conjunctiva pink. Ears:  Normal auditory acuity. Neck:  Supple Throat: Oral cavity and pharynx without inflammation, swelling or lesion.  Respiratory: Respirations even and unlabored. Lungs clear to auscultation bilaterally.   No wheezes, crackles, or rhonchi.  Cardiovascular: Normal S1, S2. No MRG. Regular rate and rhythm. No peripheral edema, cyanosis or pallor.  Gastrointestinal:  Soft, nondistended, nontender. No rebound or guarding. Normal bowel sounds. No appreciable masses or hepatomegaly. Rectal:  Not performed.  Msk:  Symmetrical without gross deformities. Without edema, no deformity or joint abnormality.  Neurologic:  Alert and  oriented x4;  grossly normal neurologically.  Skin:   Dry and intact without significant lesions or rashes. Psychiatric: Demonstrates good judgement and reason without abnormal affect or behaviors.  RELEVANT LABS AND IMAGING: CBC    Component Value Date/Time   WBC 6.8 09/29/2023 1027   WBC 8.2 09/04/2020 0832   RBC 3.91 09/29/2023 1027   RBC 3.94 09/04/2020 0832   HGB 12.3 09/29/2023 1027   HGB 12.4 02/02/2017 0903   HCT 36.9 09/29/2023 1027   HCT 37.0 02/02/2017 0903   PLT 235 09/29/2023 1027   MCV 94 09/29/2023 1027   MCV 92.9 02/02/2017 0903   MCH 31.5 09/29/2023 1027   MCH 30.2 01/05/2020 1503   MCHC 33.3 09/29/2023 1027   MCHC 34.1 09/04/2020 0832   RDW 12.3 09/29/2023 1027   RDW 12.8 02/02/2017 0903   LYMPHSABS 1.3 02/02/2017 0903   MONOABS 0.4 02/02/2017 0903   EOSABS 0.2 02/02/2017 0903   BASOSABS 0.0 02/02/2017 0903    CMP     Component Value Date/Time   NA 140 09/29/2023 1027  NA 141 02/02/2017 0903   K 4.7 09/29/2023 1027   K 4.3 02/02/2017 0903   CL 102  09/29/2023 1027   CO2 27 09/29/2023 1027   CO2 31 (H) 02/02/2017 0903   GLUCOSE 88 09/29/2023 1027   GLUCOSE 70 09/24/2021 1434   GLUCOSE 71 02/02/2017 0903   BUN 17 09/29/2023 1027   BUN 18.9 02/02/2017 0903   CREATININE 0.91 09/29/2023 1027   CREATININE 1.11 (H) 01/05/2020 1503   CREATININE 0.9 02/02/2017 0903   CALCIUM 10.0 09/29/2023 1027   CALCIUM 9.7 02/02/2017 0903   PROT 7.1 09/24/2021 1434   PROT 6.9 02/02/2017 0903   ALBUMIN 4.3 09/24/2021 1434   ALBUMIN 3.7 02/02/2017 0903   AST 34 09/24/2021 1434   AST 21 02/02/2017 0903   ALT 40 (H) 09/24/2021 1434   ALT 17 02/02/2017 0903   ALKPHOS 68 09/24/2021 1434   ALKPHOS 57 02/02/2017 0903   BILITOT 0.4 09/24/2021 1434   BILITOT 0.40 02/02/2017 0903   GFRNONAA 58 (L) 05/01/2020 0956   GFRAA 67 05/01/2020 0956    Assessment: 1.  Screening for colorectal cancer: Last colonoscopy in March 2015 was normal with repeat recommended in 10 years 2.  Constipation: Somewhat chronic for the patient maybe slightly worse lately but better when she uses Metamucil; likely age related +/- diet 3.  Chronic anticoagulation for A-fib on Eliquis   Plan: 1.  Scheduled patient for screening colonoscopy in the [facility] with Dr. Legrand.  Did provide the patient a detailed list of risks for procedure and she agrees to proceed. Patient is appropriate for endoscopic procedure(s) in the ambulatory ([facility]) setting.  2.  Would recommend the patient use Metamucil on a regular basis as this typically helps her to have more regular bowel movements.  Fiber is always the best first option.  If this is not working for her then would recommend MiraLAX daily. 3.  Patient will have a 2-day bowel prep for colonoscopy given history of constipation 4.  Patient advised to hold Eliquis  for 2 days prior to her procedure.  Will communicate with her prescribing physician to ensure this is acceptable for her. 5.  Patient to follow in clinic per recommendations after time of  procedure.  Delon Failing, PA-C  Gastroenterology 01/05/2024, 2:02 PM  Cc: Rollene Almarie LABOR, *

## 2024-01-06 NOTE — Telephone Encounter (Signed)
   Patient Name: Crystal [name]  DOB: 1950-08-10 MRN: [id number]  Primary Cardiologist: Ozell Fell, MD  Clinical pharmacists have reviewed the patient's past medical history, labs, and current medications as part of preoperative protocol coverage. The following recommendations have been made:  Patient with diagnosis of afib on Eliquis  for anticoagulation.     Procedure: Colonoscopy  Date of procedure: 02/02/2024     CHA2DS2-VASc Score = 2   This indicates a 2.2% annual risk of stroke. The patient's score is based upon: CHF History: 0 HTN History: 0 Diabetes History: 0 Stroke History: 0 Vascular Disease History: 0 Age Score: 1 Gender Score: 1        CrCl 66 mL/min Platelet count 235 K   Per office protocol, patient can hold Eliquis  for 2 days prior to procedure.   Patient will not need bridging with Lovenox (enoxaparin) around procedure.    I will route this recommendation to the requesting party via Epic fax function and remove from pre-op pool.  Please call with questions.  Lamarr Satterfield, NP 01/06/2024, 4:20 PM

## 2024-01-06 NOTE — Progress Notes (Signed)
 ____________________________________________________________  Attending physician addendum:  Thank you for sending this case to me. I have reviewed the entire note and agree with the plan.   Amada Jupiter, MD  ____________________________________________________________

## 2024-01-06 NOTE — Telephone Encounter (Signed)
 Patient with diagnosis of afib on Eliquis  for anticoagulation.    Procedure: Colonoscopy  Date of procedure: 02/02/2024   CHA2DS2-VASc Score = 2   This indicates a 2.2% annual risk of stroke. The patient's score is based upon: CHF History: 0 HTN History: 0 Diabetes History: 0 Stroke History: 0 Vascular Disease History: 0 Age Score: 1 Gender Score: 1       CrCl 66 mL/min Platelet count 235 K  Per office protocol, patient can hold Eliquis  for 2 days prior to procedure.   Patient will not need bridging with Lovenox (enoxaparin) around procedure.  **This guidance is not considered finalized until pre-operative APP has relayed final recommendations.**

## 2024-01-07 NOTE — Telephone Encounter (Signed)
 LM in regards to holding Eliquis  2 days prior to procedure on 02/02/24.   Electronically signed:  Corean Amsterdam, NCMA

## 2024-01-08 NOTE — Telephone Encounter (Signed)
 Pt informed to hold Eliquis  for 2 days prior to procedure on 02/02/24. Pt states understanding and has no further questions at this time.   Electronically signed:  Corean Amsterdam, NCMA

## 2024-01-24 ENCOUNTER — Other Ambulatory Visit: Payer: Self-pay | Admitting: Internal Medicine

## 2024-01-24 DIAGNOSIS — E0590 Thyrotoxicosis, unspecified without thyrotoxic crisis or storm: Secondary | ICD-10-CM

## 2024-01-25 ENCOUNTER — Other Ambulatory Visit: Payer: Self-pay | Admitting: Internal Medicine

## 2024-01-25 DIAGNOSIS — E0590 Thyrotoxicosis, unspecified without thyrotoxic crisis or storm: Secondary | ICD-10-CM

## 2024-01-26 ENCOUNTER — Other Ambulatory Visit: Payer: Self-pay

## 2024-01-29 ENCOUNTER — Other Ambulatory Visit: Payer: Self-pay

## 2024-01-29 DIAGNOSIS — E0590 Thyrotoxicosis, unspecified without thyrotoxic crisis or storm: Secondary | ICD-10-CM

## 2024-01-29 MED ORDER — METHIMAZOLE 5 MG PO TABS: 2.5000 mg | ORAL_TABLET | Freq: Every day | ORAL | 1 refills | Status: DC

## 2024-02-02 ENCOUNTER — Ambulatory Visit (AMBULATORY_SURGERY_CENTER): Admitting: Gastroenterology

## 2024-02-02 ENCOUNTER — Telehealth: Payer: Self-pay

## 2024-02-02 ENCOUNTER — Encounter: Payer: Self-pay | Admitting: Gastroenterology

## 2024-02-02 VITALS — BP 111/65 | HR 63 | Temp 97.9°F | Resp 24 | Ht 70.0 in | Wt 164.0 lb

## 2024-02-02 DIAGNOSIS — Z1211 Encounter for screening for malignant neoplasm of colon: Secondary | ICD-10-CM | POA: Diagnosis not present

## 2024-02-02 DIAGNOSIS — K5730 Diverticulosis of large intestine without perforation or abscess without bleeding: Secondary | ICD-10-CM

## 2024-02-02 DIAGNOSIS — K648 Other hemorrhoids: Secondary | ICD-10-CM

## 2024-02-02 MED ORDER — SODIUM CHLORIDE 0.9 % IV SOLN: 500.0000 mL | Freq: Once | INTRAVENOUS | Status: DC

## 2024-02-02 NOTE — Progress Notes (Signed)
 Sedate, gd SR, tolerated procedure well, VSS, report to RN

## 2024-02-02 NOTE — Patient Instructions (Addendum)
 Resume your Eliquis  today at the previous dose. Resume your regular medications today as ordered. Try to increase the fiber in your diet, and drink plenty of water. Read your discharge instructions.   YOU HAD AN ENDOSCOPIC PROCEDURE TODAY AT THE [facility]:   Refer to the procedure report that was given to you for any specific questions about what was found during the examination.  If the procedure report does not answer your questions, please call your gastroenterologist to clarify.  If you requested that your care partner not be given the details of your procedure findings, then the procedure report has been included in a sealed envelope for you to review at your convenience later.  YOU SHOULD EXPECT: Some feelings of bloating in the abdomen. Passage of more gas than usual.  Walking can help get rid of the air that was put into your GI tract during the procedure and reduce the bloating. If you had a lower endoscopy (such as a colonoscopy or flexible sigmoidoscopy) you may notice spotting of blood in your stool or on the toilet paper. If you underwent a bowel prep for your procedure, you may not have a normal bowel movement for a few days.  Please Note:  You might notice some irritation and congestion in your nose or some drainage.  This is from the oxygen used during your procedure.  There is no need for concern and it should clear up in a day or so.  SYMPTOMS TO REPORT IMMEDIATELY:  Following lower endoscopy (colonoscopy or flexible sigmoidoscopy):  Excessive amounts of blood in the stool  Significant tenderness or worsening of abdominal pains  Swelling of the abdomen that is new, acute  Fever of 100F or higher  For urgent or emergent issues, a gastroenterologist can be reached at any hour by calling [phone]. Do not use MyChart messaging for urgent concerns.    DIET:  We do recommend a small meal at first, but then you may proceed to your regular diet.  Drink plenty  of fluids but you should avoid alcoholic beverages for 24 hours.  ACTIVITY:  You should plan to take it easy for the rest of today and you should NOT DRIVE or use heavy machinery until tomorrow (because of the sedation medicines used during the test).    FOLLOW UP: Our staff will call the number listed on your records the next business day following your procedure.  We will call around 7:15- 8:00 am to check on you and address any questions or concerns that you may have regarding the information given to you following your procedure. If we do not reach you, we will leave a message.      SIGNATURES/CONFIDENTIALITY: You and/or your care partner have signed paperwork which will be entered into your electronic medical record.  These signatures attest to the fact that that the information above on your After Visit Summary has been reviewed and is understood.  Full responsibility of the confidentiality of this discharge information lies with you and/or your care-partner.

## 2024-02-02 NOTE — Progress Notes (Signed)
 Pt's states no medical or surgical changes since previsit or office visit.

## 2024-02-02 NOTE — Progress Notes (Signed)
 No significant changes to clinical history since GI office visit on 01/05/24.  The patient is appropriate for an endoscopic procedure in the ambulatory setting.  - Victory Brand, MD

## 2024-02-02 NOTE — Telephone Encounter (Signed)
 Copied from CRM #[id number]. Topic: Clinical - Medication Question >> Feb 02, 2024  3:49 PM Carlyon D wrote: Reason for CRM: Pt is calling went to pharmacy, pharmacy stated she should call and check with Dr. Sharman its ok to take prednisone  due to her taking Eliquis . Pt would like a call back in regards to this. Pt is ok with you reaching out via MyChart.

## 2024-02-02 NOTE — Op Note (Signed)
 [facility] Patient Name: Crystal [name] Procedure Date: 02/02/2024 1:30 PM MRN: [id number] Endoscopist: Victory L. Legrand , MD, [id number] Age: 73 Referring MD:  Date of Birth: Nov 06, 1950 Gender: Female Account #: [id number] Procedure:                Colonoscopy Indications:              Screening for colorectal malignant neoplasm                           no polyps last colonoscopy March 2025 Medicines:                Monitored Anesthesia Care Procedure:                Pre-Anesthesia Assessment:                           - Prior to the procedure, a History and Physical                            was performed, and patient medications and                            allergies were reviewed. The patient's tolerance of                            previous anesthesia was also reviewed. The risks                            and benefits of the procedure and the sedation                            options and risks were discussed with the patient.                            All questions were answered, and informed consent                            was obtained. Prior Anticoagulants: The patient has                            taken Eliquis  (apixaban ), last dose was 2 days                            prior to procedure. ASA Grade Assessment: II - A                            patient with mild systemic disease. After reviewing                            the risks and benefits, the patient was deemed in                            satisfactory condition to undergo the procedure.  After obtaining informed consent, the colonoscope                            was passed under direct vision. Throughout the                            procedure, the patient's blood pressure, pulse, and                            oxygen saturations were monitored continuously. The                            Olympus Scope SN: [id number] was introduced through                            the  anus and advanced to the the cecum, identified                            by appendiceal orifice and ileocecal valve. The                            colonoscopy was somewhat difficult due to a                            redundant colon. Successful completion of the                            procedure was aided by using manual pressure and                            straightening and shortening the scope to obtain                            bowel loop reduction. The patient tolerated the                            procedure well. The quality of the bowel                            preparation was excellent. The ileocecal valve,                            appendiceal orifice, and rectum were photographed. Scope In: 1:52:49 PM Scope Out: 2:09:50 PM Scope Withdrawal Time: 0 hours 10 minutes 30 seconds  Total Procedure Duration: 0 hours 17 minutes 1 second  Findings:                 The perianal and digital rectal examinations were                            normal.                           Repeat examination of right colon under NBI  performed.                           Multiple diverticula were found in the left colon.                           The sigmoid colon was redundant.                           Internal hemorrhoids were found.                           The exam was otherwise without abnormality on                            direct and retroflexion views. Complications:            No immediate complications. Estimated Blood Loss:     Estimated blood loss: none. Impression:               - Diverticulosis in the left colon.                           - Redundant colon.                           - Internal hemorrhoids.                           - The examination was otherwise normal on direct                            and retroflexion views.                           - No specimens collected. Recommendation:           - Patient has a contact number  available for                            emergencies. The signs and symptoms of potential                            delayed complications were discussed with the                            patient. Return to normal activities tomorrow.                            Written discharge instructions were provided to the                            patient.                           - Resume previous diet.                           - Continue present  medications.                           - Resume Eliquis  (apixaban ) at prior dose today.                           - No repeat screening colonoscopy due to age,                            current guidelines and no polyps today. [name] L. Legrand, MD 02/02/2024 2:16:19 PM This report has been signed electronically.

## 2024-02-03 ENCOUNTER — Telehealth: Payer: Self-pay | Admitting: *Deleted

## 2024-02-03 NOTE — Telephone Encounter (Signed)
Yes okay to take

## 2024-02-03 NOTE — Telephone Encounter (Signed)
  Follow up Call-     02/02/2024   12:50 PM  Call back number  Post procedure Call Back phone  # [phone]  Permission to leave phone message Yes     Patient questions:  Do you have a fever, pain , or abdominal swelling? No. Pain Score  0 *  Have you tolerated food without any problems? Yes.    Have you been able to return to your normal activities? Yes.    Do you have any questions about your discharge instructions: Diet   No. Medications  No. Follow up visit  No.  Do you have questions or concerns about your Care? No.  Actions: * If pain score is 4 or above: No action needed, pain <4.

## 2024-02-03 NOTE — Telephone Encounter (Signed)
**Note De-identified  [place] Obfuscation** Please advise 

## 2024-02-28 ENCOUNTER — Other Ambulatory Visit: Payer: Self-pay | Admitting: Cardiology

## 2024-03-02 NOTE — Progress Notes (Unsigned)
 Cardiology Office Note:    Date:  03/03/2024   ID:  Crystal [name], DOB [birth year], MRN [id number]  PCP:  Rollene Almarie LABOR, MD   [facility] Providers Cardiologist:  Ozell Fell, MD Electrophysiologist:  Will Gladis Norton, MD     Referring MD: Rollene Almarie LABOR, *   Chief Complaint  Patient presents with   Atrial Fibrillation    History of Present Illness:    Crystal [name] is a 73 y.o. female with a hx of paroxysmal atrial fibrillation, presenting for follow-up evaluation.  Patient underwent atrial fibrillation ablation in 2021.  She has been maintained on flecainide  due to recurrent atrial fibrillation but has done very well on this.  She is also followed by Dr. Norton.  She has no structural heart disease with a history of an essentially normal echocardiogram in 2021 with LVEF 60 to 65%, normal RV function, and no significant valvular disease.  She has been anticoagulated with apixaban  without bleeding problems.  The patient is here alone today.  She is doing well from a cardiac perspective.  She can hear her heartbeat at night when things are quiet.  She has not appreciated any symptomatic atrial fibrillation and feels like she has been doing very well since her flecainide  dose was increased.  She reports no chest pain, shortness of breath, lightheadedness, or leg swelling.  She denies any bleeding problems on apixaban .   Current Medications: Current Meds  Medication Sig   acetaminophen  (TYLENOL ) 325 MG tablet Take 325 mg by mouth as needed for moderate pain or headache.   apixaban  (ELIQUIS ) 5 MG TABS tablet take one tablet by mouth twice daily   Calcium Carbonate-Vitamin D  600-400 MG-UNIT tablet Take 1 tablet by mouth daily.   Cholecalciferol (VITAMIN D3) 1000 UNITS CAPS Take 1,000 Units by mouth daily.   flecainide  (TAMBOCOR ) 100 MG tablet Take 1 tablet (100 mg total) by mouth 2 (two) times daily.   methimazole  (TAPAZOLE ) 5 MG tablet Take 0.5  tablets (2.5 mg total) by mouth daily.   metoprolol  succinate (TOPROL -XL) 50 MG 24 hr tablet Take 1 tablet (50 mg total) by mouth daily. Take with or immediately following a meal.   Multiple Vitamin (MULTIVITAMIN) tablet Take 1 tablet by mouth daily.   Polyethyl Glycol-Propyl Glycol (SYSTANE OP) Place 1 drop into both eyes daily as needed (For dryness.).    Probiotic Product (FORTIFY DAILY PROBIOTIC PO) Take 1 capsule by mouth daily.      Allergies:   Patient has no known allergies.   ROS:   Please see the history of present illness.    All other systems reviewed and are negative.  EKGs/Labs/Other Studies Reviewed:    The following studies were reviewed today: Cardiac Studies & Procedures   ______________________________________________________________________________________________   STRESS TESTS  EXERCISE TOLERANCE TEST (ETT) 12/05/2022  Interpretation Summary   Up sloping ST depression in the inferolateral leads (II, III, aVF, V5 and V6) was noted.   No ischemic changes were noted.   Patient did not reach target heart rate.  Consider functional testing or coronary CT-A.   ECHOCARDIOGRAM  ECHOCARDIOGRAM COMPLETE 01/31/2020  Narrative ECHOCARDIOGRAM REPORT    Patient Name:   RAFEEF LAU [name] Date of Exam: 01/31/2020 Medical Rec #:  [id number]          Height:       70.0 in Accession #:    [id number]         Weight:       156.8  lb Date of Birth:  08-Dec-1950           BSA:          1.882 m Patient Age:    69 years           BP:           128/80 mmHg Patient Gender: F                  HR:           64 bpm. Exam Location:  [address]  Procedure: 2D Echo, 3D Echo and Cardiac Doppler  Indications:    I48.92 Atrial Fibrlation  History:        Patient has no prior history of Echocardiogram examinations. Arrythmias:Atrial Fibrillation; Risk Factors:Family History of Coronary Artery Disease. History of Right BReast Cancer (lumpectomy and Radiation- 2016).  Sonographer:     Heather Hawks RDCS Referring Phys: [phone] Tywanna Seifer  IMPRESSIONS   1. Left ventricular ejection fraction, by estimation, is 60 to 65%. The left ventricle has normal function. The left ventricle has no regional wall motion abnormalities. Left ventricular diastolic parameters were normal. 2. Right ventricular systolic function is normal. The right ventricular size is normal. There is normal pulmonary artery systolic pressure. 3. The mitral valve is normal in structure. Trivial mitral valve regurgitation. No evidence of mitral stenosis. 4. The aortic valve is tricuspid. Aortic valve regurgitation is trivial. No aortic stenosis is present. 5. The inferior vena cava is normal in size with greater than 50% respiratory variability, suggesting right atrial pressure of 3 mmHg.  FINDINGS Left Ventricle: Left ventricular ejection fraction, by estimation, is 60 to 65%. The left ventricle has normal function. The left ventricle has no regional wall motion abnormalities. The left ventricular internal cavity size was normal in size. There is no left ventricular hypertrophy. Left ventricular diastolic parameters were normal.  Right Ventricle: The right ventricular size is normal.Right ventricular systolic function is normal. There is normal pulmonary artery systolic pressure. The tricuspid regurgitant velocity is 2.61 m/s, and with an assumed right atrial pressure of 3 mmHg, the estimated right ventricular systolic pressure is 30.2 mmHg.  Left Atrium: Left atrial size was normal in size.  Right Atrium: Right atrial size was normal in size.  Pericardium: There is no evidence of pericardial effusion.  Mitral Valve: The mitral valve is normal in structure. Normal mobility of the mitral valve leaflets. Trivial mitral valve regurgitation. No evidence of mitral valve stenosis.  Tricuspid Valve: The tricuspid valve is normal in structure. Tricuspid valve regurgitation is mild . No evidence of tricuspid  stenosis.  Aortic Valve: The aortic valve is tricuspid. Aortic valve regurgitation is trivial. No aortic stenosis is present.  Pulmonic Valve: The pulmonic valve was normal in structure. Pulmonic valve regurgitation is trivial. No evidence of pulmonic stenosis.  Aorta: The aortic root is normal in size and structure.  Venous: The inferior vena cava is normal in size with greater than 50% respiratory variability, suggesting right atrial pressure of 3 mmHg.  IAS/Shunts: No atrial level shunt detected by color flow Doppler.   LEFT VENTRICLE PLAX 2D LVIDd:         5.00 cm  Diastology LVIDs:         2.80 cm  LV e' lateral:   10.10 cm/s LV PW:         0.85 cm  LV E/e' lateral: 7.6 LV IVS:        0.65 cm  LV  e' medial:    7.94 cm/s LVOT diam:     2.40 cm  LV E/e' medial:  9.7 LV SV:         121 LV SV Index:   64 LVOT Area:     4.52 cm  3D Volume EF: 3D EF:        71 % LV EDV:       123 ml LV ESV:       35 ml LV SV:        88 ml  RIGHT VENTRICLE RV S prime:     17.80 cm/s TAPSE (M-mode): 2.4 cm  LEFT ATRIUM             Index       RIGHT ATRIUM           Index LA diam:        3.90 cm 2.07 cm/m  RA Area:     13.40 cm LA Vol (A2C):   66.0 ml 35.06 ml/m RA Volume:   34.30 ml  18.22 ml/m LA Vol (A4C):   50.9 ml 27.04 ml/m LA Biplane Vol: 60.6 ml 32.20 ml/m AORTIC VALVE LVOT Vmax:   103.00 cm/s LVOT Vmean:  70.400 cm/s LVOT VTI:    0.267 m  AORTA Ao Root diam: 3.10 cm Ao Asc diam:  3.70 cm  MITRAL VALVE               TRICUSPID VALVE MV Area (PHT): cm         TR Peak grad:   27.2 mmHg MV Decel Time: 310 msec    TR Vmax:        261.00 cm/s MV E velocity: 76.90 cm/s MV A velocity: 60.20 cm/s  SHUNTS MV E/A ratio:  1.28        Systemic VTI:  0.27 m Systemic Diam: 2.40 cm  Redell Shallow MD Electronically signed by Redell Shallow MD Signature Date/Time: 01/31/2020/11:48:31 AM    Final    MONITORS  LONG TERM MONITOR (3-14 DAYS) 02/16/2020  Narrative 1. The basic  rhythm is normal sinus with an average HR of 71 bpm 2. Atrial fibrillation with rapid ventricular rate occurs with a 5% burden 3. No high-grade heart block or pathologic pauses 4. No bradycardic events 5. No significant ventricular ectopy       ______________________________________________________________________________________________      EKG:        Recent Labs: 09/29/2023: BUN 17; Creatinine, Ser 0.91; Hemoglobin 12.3; Platelets 235; Potassium 4.7; Sodium 140 10/26/2023: TSH 1.94  Recent Lipid Panel    Component Value Date/Time   CHOL 208 (H) 11/03/2023 1111   TRIG 117.0 11/03/2023 1111   HDL 76.00 11/03/2023 1111   CHOLHDL 3 11/03/2023 1111   VLDL 23.4 11/03/2023 1111   LDLCALC 109 (H) 11/03/2023 1111     Risk Assessment/Calculations:    CHA2DS2-VASc Score = 2   This indicates a 2.2% annual risk of stroke. The patient's score is based upon: CHF History: 0 HTN History: 0 Diabetes History: 0 Stroke History: 0 Vascular Disease History: 0 Age Score: 1 Gender Score: 1                Physical Exam:    VS:  BP 120/82 (BP Location: Left Arm, Patient Position: Sitting, Cuff Size: Normal)   Pulse 62   Ht 5' 9 (1.753 m)   Wt 15 lb 6.4 oz (6.985 kg)   BMI 2.27 kg/m     Wt Readings  from Last 3 Encounters:  03/03/24 15 lb 6.4 oz (6.985 kg)  02/02/24 164 lb (74.4 kg)  01/05/24 164 lb 4 oz (74.5 kg)     GEN:  Well nourished, well developed in no acute distress HEENT: Normal NECK: No JVD; No carotid bruits LYMPHATICS: No lymphadenopathy CARDIAC: RRR, no murmurs, rubs, gallops RESPIRATORY:  Clear to auscultation without rales, wheezing or rhonchi  ABDOMEN: Soft, non-tender, non-distended MUSCULOSKELETAL:  No edema; No deformity  SKIN: Warm and dry NEUROLOGIC:  Alert and oriented x 3 PSYCHIATRIC:  Normal affect   Assessment & Plan Paroxysmal atrial fibrillation [facility]) Patient doing well on combination of metoprolol  succinate and flecainide .  Tolerating  anticoagulation with apixaban .  No changes are made today.  Last labs reviewed from April 2025 which demonstrated normal electrolytes, normal blood counts with hemoglobin of 12.3, and normal thyroid  function studies.  Patient will follow-up with Dr. Inocencio as scheduled.  I will see her back as needed.            Medication Adjustments/Labs and Tests Ordered: Current medicines are reviewed at length with the patient today.  Concerns regarding medicines are outlined above.  No orders of the defined types were placed in this encounter.  No orders of the defined types were placed in this encounter.   Patient Instructions  Medication Instructions:  Your physician recommends that you continue on your current medications as directed. Please refer to the Current Medication list given to you today.  *If you need a refill on your cardiac medications before your next appointment, please call your pharmacy*  Lab Work: NONE  If you have labs (blood work) drawn today and your tests are completely normal, you will receive your results only by: MyChart Message (if you have MyChart) OR A paper copy in the mail If you have any lab test that is abnormal or we need to change your treatment, we will call you to review the results.  Testing/Procedures: NONE  Follow-Up:As needed At [facility], you and your health needs are our priority.  As part of our continuing mission to provide you with exceptional heart care, our providers are all part of one team.  This team includes your primary Cardiologist (physician) and Advanced Practice Providers or APPs (Physician Assistants and Nurse Practitioners) who all work together to provide you with the care you need, when you need it.   Provider:   Ozell Fell, MD      Signed, Ozell Fell, MD  03/03/2024 11:15 AM    [facility]

## 2024-03-03 ENCOUNTER — Encounter: Payer: Self-pay | Admitting: Cardiovascular Disease

## 2024-03-03 ENCOUNTER — Ambulatory Visit: Attending: Cardiovascular Disease | Admitting: Cardiovascular Disease

## 2024-03-03 VITALS — BP 120/82 | HR 62 | Ht 69.0 in | Wt <= 1120 oz

## 2024-03-03 DIAGNOSIS — I480 Paroxysmal atrial fibrillation: Secondary | ICD-10-CM | POA: Diagnosis present

## 2024-03-03 NOTE — Patient Instructions (Signed)
 Medication Instructions:  Your physician recommends that you continue on your current medications as directed. Please refer to the Current Medication list given to you today.  *If you need a refill on your cardiac medications before your next appointment, please call your pharmacy*  Lab Work: NONE  If you have labs (blood work) drawn today and your tests are completely normal, you will receive your results only by: MyChart Message (if you have MyChart) OR A paper copy in the mail If you have any lab test that is abnormal or we need to change your treatment, we will call you to review the results.  Testing/Procedures: NONE  Follow-Up:As needed At [facility], you and your health needs are our priority.  As part of our continuing mission to provide you with exceptional heart care, our providers are all part of one team.  This team includes your primary Cardiologist (physician) and Advanced Practice Providers or APPs (Physician Assistants and Nurse Practitioners) who all work together to provide you with the care you need, when you need it.   Provider:   Ozell Fell, MD

## 2024-03-03 NOTE — Assessment & Plan Note (Signed)
 Patient doing well on combination of metoprolol  succinate and flecainide .  Tolerating anticoagulation with apixaban .  No changes are made today.  Last labs reviewed from April 2025 which demonstrated normal electrolytes, normal blood counts with hemoglobin of 12.3, and normal thyroid  function studies.  Patient will follow-up with Dr. Inocencio as scheduled.  I will see her back as needed.

## 2024-03-20 ENCOUNTER — Other Ambulatory Visit: Payer: Self-pay | Admitting: Cardiology

## 2024-03-24 ENCOUNTER — Encounter: Payer: Self-pay | Admitting: Internal Medicine

## 2024-03-24 ENCOUNTER — Ambulatory Visit (INDEPENDENT_AMBULATORY_CARE_PROVIDER_SITE_OTHER): Admitting: Internal Medicine

## 2024-03-24 VITALS — BP 126/78 | HR 67 | Ht 69.0 in | Wt 167.6 lb

## 2024-03-24 DIAGNOSIS — E0500 Thyrotoxicosis with diffuse goiter without thyrotoxic crisis or storm: Secondary | ICD-10-CM | POA: Diagnosis not present

## 2024-03-24 DIAGNOSIS — E0590 Thyrotoxicosis, unspecified without thyrotoxic crisis or storm: Secondary | ICD-10-CM

## 2024-03-24 NOTE — Progress Notes (Signed)
 Patient ID: Crystal [name], female   DOB: May 24, 1951, 73 y.o.   MRN: [id number]  HPI  Crystal [name] is a pleasant 73 y.o.-year-old female, returning for follow-up for thyrotoxicosis.  She previously saw Crystal [name], last visit 1 year ago.  Interim history: No palpitations at today's visit.  She continues to have mild anxiety.  She also continues to have insomnia - difficulty staying asleep, not falling asleep. She c/o weight gain -approximately 7 pounds since our last visit. She was on Prednisone  x 6 days  - 4 weeks ago, for knee pain.  She feels that the weight gain started before starting prednisone . She is preparing to go to [place] for 2 weeks next month [employer]).  Reviewed history: Patient describes that she was diagnosed with thyrotoxicosis in 2021 - after she presented to her PCP with due to unintentional weight loss (15 lbs in 1 year).  A TSH was checked at that time and it was low, while the free T4 was normal.  She was referred to Crystal [name].  At the time of the visit, a TSH was suppressed and free T4 was high.  She was started on methimazole .  She had to decrease the dose of methimazole  in 2022.  She is currently on: - Methimazole  (MMI) 5 mg daily >> 2.5 mg daily (dose decreased 02/2022) >> 5 mg daily (dose increased 03/2023) >> 2.5 mg daily (dose decreased 08/2023 - 5 mg qod) - Toprol  XL 50 mg daily.  Also on flecainide  2x a day.  I reviewed pt's thyroid  tests: Lab Results  Component Value Date   TSH 1.94 10/26/2023   TSH 4.26 09/23/2023   TSH 1.24 05/06/2023   TSH 0.45 03/26/2023   TSH 0.99 09/22/2022   TSH 3.13 04/23/2022   TSH 4.13 03/20/2022   TSH 2.20 10/16/2021   TSH 3.78 07/16/2021   TSH 2.51 05/10/2021   FREET4 1.1 10/26/2023   FREET4 1.2 09/23/2023   FREET4 0.80 05/06/2023   FREET4 0.97 03/26/2023   FREET4 0.83 09/22/2022   FREET4 0.87 04/23/2022   FREET4 0.77 03/20/2022   FREET4 0.79 10/16/2021   FREET4 0.83 07/16/2021   FREET4 0.85 05/10/2021    T3FREE 3.2 10/26/2023   T3FREE 2.9 09/23/2023   T3FREE 3.1 05/06/2023   T3FREE 3.3 03/26/2023   T3FREE 2.4 09/22/2022   T3FREE 3.1 04/23/2022   T3FREE 2.7 03/20/2022   Lab Results  Component Value Date   T3FREE 3.2 10/26/2023   T3FREE 2.9 09/23/2023   T3FREE 3.1 05/06/2023   T3FREE 3.3 03/26/2023   T3FREE 2.4 09/22/2022   T3FREE 3.1 04/23/2022   T3FREE 2.7 03/20/2022   Antithyroid antibodies: Lab Results  Component Value Date   TSI 192 (H) 03/20/2022   Pt denies: - feeling nodules in neck - hoarseness - dysphagia - choking  She denies: - excessive sweating/heat intolerance - tremors - weight loss   She continues to have: - anxiety - insomnia  Pt does have a FH of thyroid  ds. : mother with hypothyroidism. No FH of thyroid  cancer. No h/o radiation tx to head or neck. No recent contrast studies. No herbal supplements. No Biotin use.  Pt. also has a history of breast cancer, s/p RxTx.    She also has paroxysmal atrial fibrillation in 2021 (before her diagnosis of Thyrotoxicosis) - had an ablation procedure; on metoprolol , flecainide , Eliquis .  ROS: Constitutional: + see HPI  Past Medical History:  Diagnosis Date   Atrial fibrillation (HCC)    BACK  PAIN, LUMBAR    Breast cancer of lower-outer quadrant of right female breast (HCC) 10/24/2014   Breast cancer, right breast    FIBROADENOMA, BREAST 2006   HPV in female    Hyperthyroidism    Neuromuscular disorder (HCC)    Peripheral Neuropathy   New onset atrial fibrillation (HCC) 01/06/2020   Palpitations 01/06/2020   PARESTHESIA    toes   PONV (postoperative nausea and vomiting)    Radiation 11/23/14-12/25/14   right breast 42.72 gray, lumpectomy cavity boosted to 54.72 gray   Routine health maintenance 03/30/2013   Colonoscopy - for colonoscopy (Sept '14)  Immunization  Tdap Oct '14    Spider veins of both lower extremities 05/09/2019   Wears glasses    Past Surgical History:  Procedure Laterality  Date   ATRIAL FIBRILLATION ABLATION N/A 05/25/2020   Procedure: ATRIAL FIBRILLATION ABLATION;  Surgeon: Crystal Soyla Lunger, MD;  Location: [facility];  Service: Cardiovascular;  Laterality: N/A;   atrophic vaginitis     BREAST LUMPECTOMY WITH RADIOACTIVE SEED LOCALIZATION Right 10/18/2014   Procedure: BREAST LUMPECTOMY WITH RADIOACTIVE SEED LOCALIZATION;  Surgeon: Crystal Poli, MD;  Location:  SUR[facility]ERY CENTER;  Service: General;  Laterality: Right;   BREAST SURGERY     Cancer   COLONOSCOPY  09/05/2013   D & C and hysteroscopy  11/29/1998   for uterine polyp   DILATATION & CURRETTAGE/HYSTEROSCOPY WITH RESECTOCOPE N/A 05/30/2015   Procedure: DILATATION & CURETTAGE/HYSTEROSCOPY WITH RESECTOCOPE;  Surgeon: Crystal Burly, MD;  Location: [facility];  Service: Gynecology;  Laterality: N/A;   DILATION AND CURETTAGE OF UTERUS     Sterotactix needle biopsy  06/30/1998   breast calcification   Social History   Socioeconomic History   Marital status: Married    Spouse name: Crystal [name]   Number of children: 2   Years of education: 16   Highest education level: Bachelor's degree (e.g., BA, AB, BS)  Occupational History   Occupation: PROPERTY M[employer]    Employer: [employer]  Tobacco Use   Smoking status: Never   Smokeless tobacco: Never   Tobacco comments:    Never smoked  Vaping Use   Vaping status: Never Used  Substance and Sexual Activity   Alcohol use: Yes    Comment: May have 1 beer a month   Drug use: No   Sexual activity: Yes    Partners: Male    Birth control/protection: Post-menopausal    Comment: 1st intercourse- 21, partners- less than 5  Other Topics Concern   Not on file  Social History Narrative   [place], [place], Married '78,  1 son '81, 1 daughter '84, 1 grandson '12.    Work - Geneticist, molecular.  Lives with husband. ACP - discussed with patient.    Refer - [email or web address]   Highest level of education:   BS   Right handed   Two story home      Lives with husband/2025   Social Drivers of Health   Financial Resource Strain: Low Risk  (11/18/2023)   Overall Financial Resource Strain (CARDIA)    Difficulty of Paying Living Expenses: Not very hard  Food Insecurity: No Food Insecurity (11/18/2023)   Hunger Vital Sign    Worried About Running Out of Food in the Last Year: Never true    Ran Out of Food in the Last Year: Never true  Transportation Needs: No Transportation Needs (11/18/2023)   PRAPARE - Transportation  Lack of Transportation (Medical): No    Lack of Transportation (Non-Medical): No  Physical Activity: Insufficiently Active (11/18/2023)   Exercise Vital Sign    Days of Exercise per Week: 1 day    Minutes of Exercise per Session: 60 min  Stress: No Stress Concern Present (11/18/2023)   [employer] of Occupational Health - Occupational Stress Questionnaire    Feeling of Stress : Only a little  Social Connections: Socially Integrated (11/18/2023)   Social Connection and Isolation Panel    Frequency of Communication with Friends and Family: More than three times a week    Frequency of Social Gatherings with Friends and Family: More than three times a week    Attends Religious Services: More than 4 times per year    Active Member of [employer] or Organizations: Yes    Attends Banker Meetings: Never    Marital Status: Married  Catering manager Violence: Not At Risk (11/18/2023)   Humiliation, Afraid, Rape, and Kick questionnaire    Fear of Current or Ex-Partner: No    Emotionally Abused: No    Physically Abused: No    Sexually Abused: No   Current Outpatient Medications on File Prior to Visit  Medication Sig Dispense Refill   acetaminophen  (TYLENOL ) 325 MG tablet Take 325 mg by mouth as needed for moderate pain or headache.     apixaban  (ELIQUIS ) 5 MG TABS tablet take one tablet by mouth twice daily 180 tablet 1   Calcium Carbonate-Vitamin D  600-400 MG-UNIT  tablet Take 1 tablet by mouth daily.     Cholecalciferol (VITAMIN D3) 1000 UNITS CAPS Take 1,000 Units by mouth daily.     flecainide  (TAMBOCOR ) 100 MG tablet Take 1 tablet (100 mg total) by mouth 2 (two) times daily. 180 tablet 1   methimazole  (TAPAZOLE ) 5 MG tablet Take 0.5 tablets (2.5 mg total) by mouth daily. 45 tablet 1   metoprolol  succinate (TOPROL -XL) 50 MG 24 hr tablet Take 1 tablet (50 mg total) by mouth daily. Take with or immediately following a meal. 90 tablet 2   Multiple Vitamin (MULTIVITAMIN) tablet Take 1 tablet by mouth daily.     Polyethyl Glycol-Propyl Glycol (SYSTANE OP) Place 1 drop into both eyes daily as needed (For dryness.).      Probiotic Product (FORTIFY DAILY PROBIOTIC PO) Take 1 capsule by mouth daily.      No current facility-administered medications on file prior to visit.   No Known Allergies Family History  Problem Relation Age of Onset   Hypothyroidism Mother    GER disease Mother    Osteoporosis Mother    Hypertension Mother    Cancer Mother        hodgkins lympoma, uterine cancer   Arrhythmia Mother    Vision loss Mother    Colon polyps Mother    Hyperlipidemia Father    Hypertension Father    Diabetes Father    Coronary artery disease Father    Alzheimer's disease Brother    Coronary artery disease Maternal Grandfather    COPD Neg Hx    Esophageal cancer Neg Hx    Rectal cancer Neg Hx    Stomach cancer Neg Hx    Colon cancer Neg Hx    PE: BP 126/78 (BP Location: Left Arm, Patient Position: Sitting, Cuff Size: Normal)   Pulse 67   Ht 5' 9 (1.753 m)   Wt 167 lb 9.6 oz (76 kg)   SpO2 97%   BMI 24.75 kg/m  Wt Readings from Last 10 Encounters:  03/24/24 167 lb 9.6 oz (76 kg)  03/03/24 15 lb 6.4 oz (6.985 kg)  02/02/24 164 lb (74.4 kg)  01/05/24 164 lb 4 oz (74.5 kg)  11/18/23 163 lb (73.9 kg)  11/03/23 163 lb (73.9 kg)  09/29/23 163 lb (73.9 kg)  08/27/23 164 lb (74.4 kg)  07/07/23 164 lb (74.4 kg)  02/17/23 160 lb 6.4 oz (72.8  kg)   Constitutional: normal weight, in NAD Eyes: EOMI, no exophthalmos, no lid lag, no stare ENT: no thyromegaly, no cervical LAD Cardiovascular: RRR, No MRG Respiratory: CTA B Musculoskeletal: no deformities Skin: no rashes Neurological: + Slight tremor with outstretched hands  ASSESSMENT: 1. Graves disease  PLAN:  1. Patient with history of a suppressed TSH with elevated free T4 (I do not have records of the free T3 level), with unintentional weight loss of 15 pounds but no other signs of thyrotoxicosis: No heat intolerance, hyperdefecation, palpitations, anxiety.  Retrospectively, she was diagnosed with atrial fibrillation in the same year, before her diagnosis of thyrotoxicosis.  She had an ablation and was subsequently started on beta-blocker, Eliquis , and flecainide . -She was previously seen by Crystal [name] and was assumed to have Graves' disease and started on methimazole .  Her TSI's were elevated, confirming Graves' disease.  We decreased her methimazole  dose from 5 mg to 2.5 mg daily. -She previously complained of palpitations, but these were resolved at last visit after increasing her flecainide  dose to twice a day.  She did have insomnia and anxiety, but this was chronic for her. -At last visit, her TSH was low in the normal range so I suggested to increase her methimazole  dose to 5 mg daily, but in 08/2023, TSH was higher in the normal range so we decreased the dose back to 2.5 mg daily, which she continues today -We did discuss about possible modalities of treatment for Graves' disease to include continuing methimazole  or doing radioactive iodine ablation or thyroid  surgery.  Since she responded well to a  low dose of methimazole , we decided to continue with this.  She is aware about possible side effects including decreased white blood cells and liver dysfunction. - At today's visit, she is not tachycardic or tremulous.  She continues on beta-blocker per cardiology. - she feels  well today, but she does mention weight gain several pounds since last year - She has dry eyes, for which tried different treatments in the past without good results.  No blurry vision, eye pressure, pain with eye movement, or chemosis, therefore, there are no active signs of Graves' ophthalmopathy - Will check her TFTs today and change the methimazole  dose accordingly.  Will also add TSI antibodies.  We discussed that we would be more likely to be able to stop methimazole  if her Graves' antibodies were undetectable - I plan to see her back in 1 year, but for labs in 6 months  Needs MMI refills.  Orders Placed This Encounter  Procedures   TSH   T4, free   T3, free   Thyroid  stimulating immunoglobulin   Lela Fendt, MD PhD [facility] Endocrinology

## 2024-03-24 NOTE — Patient Instructions (Signed)
Please stop at the lab.  For now, continue Methimazole 2.5 mg daily.  You should have an endocrinology follow-up appointment in 1 year, but in 6 months for labs.

## 2024-03-25 ENCOUNTER — Ambulatory Visit: Payer: Self-pay | Admitting: Internal Medicine

## 2024-03-25 ENCOUNTER — Ambulatory Visit: Payer: Medicare Other | Admitting: Internal Medicine

## 2024-03-25 MED ORDER — METHIMAZOLE 5 MG PO TABS: 2.5000 mg | ORAL_TABLET | Freq: Every day | ORAL | 3 refills | Status: AC

## 2024-03-25 NOTE — Addendum Note (Signed)
 Addended by: TRIXIE FILE on: 03/25/2024 11:02 AM   Modules accepted: Orders

## 2024-03-28 LAB — T3, FREE: T3, Free: 2.8 pg/mL (ref 2.3–4.2)

## 2024-03-28 LAB — TSH: TSH: 1.15 m[IU]/L (ref 0.40–4.50)

## 2024-03-28 LAB — T4, FREE: Free T4: 1.2 ng/dL (ref 0.8–1.8)

## 2024-03-28 LAB — THYROID STIMULATING IMMUNOGLOBULIN: TSI: 189 %{baseline} — ABNORMAL HIGH (ref <140)

## 2024-04-24 NOTE — Progress Notes (Unsigned)
  Electrophysiology Office Note:   Date:  04/25/2024  ID:  Crystal [name], DOB June 08, 1951, MRN [id number]  Primary Cardiologist: Ozell Fell, MD Primary Heart Failure: None Electrophysiologist: Mora Pedraza Gladis Norton, MD      History of Present Illness:   Crystal [name] is a 73 y.o. female with h/o atrial fibrillation seen today for routine electrophysiology followup.   Since last being seen in our clinic the patient reports doing well.  She has had no further episodes of atrial fibrillation since last being seen.  She is tolerating her flecainide  without issue..  she denies chest pain, palpitations, dyspnea, PND, orthopnea, nausea, vomiting, dizziness, syncope, edema, weight gain, or early satiety.   Review of systems complete and found to be negative unless listed in HPI.   EP Information / Studies Reviewed:    EKG is ordered today. Personal review as below.  EKG Interpretation Date/Time:  Monday April 25 2024 10:41:40 EDT Ventricular Rate:  57 PR Interval:  194 QRS Duration:  98 QT Interval:  430 QTC Calculation: 418 R Axis:   77  Text Interpretation: Sinus bradycardia with sinus arrhythmia When compared with ECG of 29-Sep-2023 09:41, No significant change was found Confirmed by Crystal [name] ([id number]) on 04/25/2024 10:54:19 AM    Risk Assessment/Calculations:    CHA2DS2-VASc Score = 2   This indicates a 2.2% annual risk of stroke. The patient's score is based upon: CHF History: 0 HTN History: 0 Diabetes History: 0 Stroke History: 0 Vascular Disease History: 0 Age Score: 1 Gender Score: 1             Physical Exam:   VS:  BP 110/74 (BP Location: Left Arm, Patient Position: Sitting, Cuff Size: Normal)   Pulse (!) 57   Ht 5' 9 (1.753 m)   Wt 163 lb (73.9 kg)   SpO2 95%   BMI 24.07 kg/m    Wt Readings from Last 3 Encounters:  04/25/24 163 lb (73.9 kg)  03/24/24 167 lb 9.6 oz (76 kg)  03/03/24 15 lb 6.4 oz (6.985 kg)     GEN: Well nourished, well  developed in no acute distress NECK: No JVD; No carotid bruits CARDIAC: Regular rate and rhythm, no murmurs, rubs, gallops RESPIRATORY:  Clear to auscultation without rales, wheezing or rhonchi  ABDOMEN: Soft, non-tender, non-distended EXTREMITIES:  No edema; No deformity   ASSESSMENT AND PLAN:    1.  Paroxysmal atrial fibrillation: Post ablation 05/25/2020.  Of recurrence and is on flecainide  and metoprolol .  She remains in sinus rhythm.  No changes.  2.  Secondary hypercoagulable state: On Eliquis   3.  High risk medication monitoring: On flecainide .  QRS remains narrow.  Follow up with EP Team as usual post procedure  Signed, Crystal [name] Gladis Norton, MD

## 2024-04-25 ENCOUNTER — Encounter: Payer: Self-pay | Admitting: Cardiology

## 2024-04-25 ENCOUNTER — Ambulatory Visit: Attending: Cardiology | Admitting: Cardiology

## 2024-04-25 VITALS — BP 110/74 | HR 57 | Ht 69.0 in | Wt 163.0 lb

## 2024-04-25 DIAGNOSIS — Z79899 Other long term (current) drug therapy: Secondary | ICD-10-CM | POA: Insufficient documentation

## 2024-04-25 DIAGNOSIS — D6869 Other thrombophilia: Secondary | ICD-10-CM | POA: Diagnosis present

## 2024-04-25 DIAGNOSIS — I480 Paroxysmal atrial fibrillation: Secondary | ICD-10-CM | POA: Diagnosis not present

## 2024-05-04 LAB — HM MAMMOGRAPHY

## 2024-05-05 ENCOUNTER — Encounter: Payer: Self-pay | Admitting: Internal Medicine

## 2024-05-17 ENCOUNTER — Ambulatory Visit (INDEPENDENT_AMBULATORY_CARE_PROVIDER_SITE_OTHER): Admitting: Internal Medicine

## 2024-05-17 ENCOUNTER — Ambulatory Visit: Payer: Self-pay | Admitting: Internal Medicine

## 2024-05-17 VITALS — BP 130/88 | HR 55 | Temp 97.9°F | Ht 69.0 in | Wt 168.0 lb

## 2024-05-17 DIAGNOSIS — R350 Frequency of micturition: Secondary | ICD-10-CM

## 2024-05-17 DIAGNOSIS — N390 Urinary tract infection, site not specified: Secondary | ICD-10-CM | POA: Diagnosis not present

## 2024-05-17 LAB — POCT URINALYSIS DIP (CLINITEK)
Bilirubin, UA: NEGATIVE
Glucose, UA: NEGATIVE mg/dL
Ketones, POC UA: NEGATIVE mg/dL
Leukocytes, UA: NEGATIVE
Nitrite, UA: NEGATIVE
POC PROTEIN,UA: NEGATIVE
Spec Grav, UA: 1.01 (ref 1.010–1.025)
Urobilinogen, UA: 0.2 U/dL
pH, UA: 6.5 (ref 5.0–8.0)

## 2024-05-17 MED ORDER — CIPROFLOXACIN HCL 250 MG PO TABS: ORAL_TABLET | ORAL | 0 refills | Status: DC

## 2024-05-17 MED ORDER — FLUCONAZOLE 150 MG PO TABS: 150.0000 mg | ORAL_TABLET | Freq: Once | ORAL | 0 refills | Status: AC

## 2024-05-17 NOTE — Progress Notes (Signed)
 Patient Care Team: Rollene Almarie LABOR, MD as PCP - General (Internal Medicine) Wonda Sharper, MD as PCP - Cardiology (Cardiology) Inocencio Soyla Lunger, MD as PCP - Electrophysiology (Cardiology) Moses Powell Hummer, NP as Nurse Practitioner (Nurse Practitioner) Tobie Tonita POUR, DO as Consulting Physician (Neurology) Pa, [facility] as Consulting Physician (Ophthalmology)  Visit Date: 05/17/24  Subjective:    Patient ID: Crystal [name] , Female   DOB: May 16, 1951, 73 y.o.    MRN: [id number]   73 y.o. Female presents today for Dysuria. Patient has a past medical history of Hypothyroidism, breast cancer. .  She is complaining of urinary frequency and dysuria. She has a history of infrequent UTIs.  She was seen here on 09/09/2021 for a symptoms of a UTI. 09/09/2021 Urine culture showed E. Coli and she was treated with Cipro . Urinalysis today showed urine was yellow and clear with a moderate about of blood. Urine culture pending.    Past Medical History:  Diagnosis Date   Atrial fibrillation (HCC)    BACK PAIN, LUMBAR    Breast cancer (HCC)    Breast cancer of lower-outer quadrant of right female breast (HCC) 10/24/2014   Breast cancer, right breast    FIBROADENOMA, BREAST 2006   HPV in female    Hyperthyroidism    Neuromuscular disorder (HCC)    Peripheral Neuropathy   New onset atrial fibrillation (HCC) 01/06/2020   Osteoporosis    Osteopenia   Palpitations 01/06/2020   PARESTHESIA    toes   PONV (postoperative nausea and vomiting)    Radiation 11/23/14-12/25/14   right breast 42.72 gray, lumpectomy cavity boosted to 54.72 gray   Routine health maintenance 03/30/2013   Colonoscopy - for colonoscopy (Sept '14)  Immunization  Tdap Oct '14    Spider veins of both lower extremities 05/09/2019   Wears glasses      Family History  Problem Relation Age of Onset   Hypothyroidism Mother    GER disease Mother    Osteoporosis Mother     Hypertension Mother    Cancer Mother        hodgkins lympoma, uterine cancer   Arrhythmia Mother    Vision loss Mother    Colon polyps Mother    Hyperlipidemia Father    Hypertension Father    Diabetes Father    Coronary artery disease Father    Alzheimer's disease Brother    Coronary artery disease Maternal Grandfather    COPD Neg Hx    Esophageal cancer Neg Hx    Rectal cancer Neg Hx    Stomach cancer Neg Hx    Colon cancer Neg Hx     Social History   Social History Narrative   [place], [place], Married '78,  1 son '81, 1 daughter '84, 1 grandson '12.    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 ACP - discussed with patient.    Refer - [email or web address]   Highest level of education:  BS   Right handed   Two story home      Lives with husband/2025      Review of Systems  Genitourinary:  Positive for dysuria and frequency.        Objective:   Vitals: BP 130/88   Pulse (!) 55   Temp 97.9 F (36.6 C)   Ht 5' 9 (1.753 m)   Wt 168 lb (76.2 kg)   SpO2 95%   BMI 24.81 kg/m  Physical Exam Vitals and nursing note reviewed.       Results:    Labs:       Component Value Date/Time   NA 140 09/29/2023 1027   NA 141 02/02/2017 0903   K 4.7 09/29/2023 1027   K 4.3 02/02/2017 0903   CL 102 09/29/2023 1027   CO2 27 09/29/2023 1027   CO2 31 (H) 02/02/2017 0903   GLUCOSE 88 09/29/2023 1027   GLUCOSE 70 09/24/2021 1434   GLUCOSE 71 02/02/2017 0903   BUN 17 09/29/2023 1027   BUN 18.9 02/02/2017 0903   CREATININE 0.91 09/29/2023 1027   CREATININE 1.11 (H) 01/05/2020 1503   CREATININE 0.9 02/02/2017 0903   CALCIUM 10.0 09/29/2023 1027   CALCIUM 9.7 02/02/2017 0903   PROT 7.1 09/24/2021 1434   PROT 6.9 02/02/2017 0903   ALBUMIN 4.3 09/24/2021 1434   ALBUMIN 3.7 02/02/2017 0903   AST 34 09/24/2021 1434   AST 21 02/02/2017 0903   ALT 40 (H) 09/24/2021 1434   ALT 17 02/02/2017 0903   ALKPHOS 68 09/24/2021  1434   ALKPHOS 57 02/02/2017 0903   BILITOT 0.4 09/24/2021 1434   BILITOT 0.40 02/02/2017 0903   GFRNONAA 58 (L) 05/01/2020 0956   GFRAA 67 05/01/2020 0956     Lab Results  Component Value Date   WBC 6.8 09/29/2023   HGB 12.3 09/29/2023   HCT 36.9 09/29/2023   MCV 94 09/29/2023   PLT 235 09/29/2023    Lab Results  Component Value Date   CHOL 208 (H) 11/03/2023   HDL 76.00 11/03/2023   LDLCALC 109 (H) 11/03/2023   TRIG 117.0 11/03/2023   CHOLHDL 3 11/03/2023    Lab Results  Component Value Date   HGBA1C 5.7 11/03/2023     Lab Results  Component Value Date   TSH 1.15 03/24/2024        Assessment & Plan:     Meds ordered this encounter  Medications   ciprofloxacin  (CIPRO ) 250 MG tablet    Sig: One tab twice daily x 5 days    Dispense:  10 tablet    Refill:  0   fluconazole  (DIFLUCAN ) 150 MG tablet    Sig: Take 1 tablet (150 mg total) by mouth once for 1 dose.    Dispense:  1 tablet    Refill:  0    Orders Placed This Encounter  Procedures   Urine Culture   POCT URINALYSIS DIP (CLINITEK)    Acute lower urinary tract infection with Frequent urination: She is complaining of urinary frequency and dysuria. She has a history of infrequent UTIs.  She was seen here on 09/09/2021 for a symptoms of a UTI. 09/09/2021 Urine culture showed E. Coli and she was treated with Cipro . Urinalysis today showed urine was yellow and clear with a moderate about of blood.  Urine culture pending.     Cipro  250 mg twice daily by mouth for 5 days prescribed.   Diflucan  150 mg tablet prescribed one time doe if needed for Candida vaginitis  Await culture results. Rest and stay well hydrated.   I,Makayla C Reid,acting as a scribe for Ronal JINNY Hailstone, MD.,have documented all relevant documentation on the behalf of Ronal JINNY Hailstone, MD,as directed by  Ronal JINNY Hailstone, MD while in the presence of Ronal JINNY Hailstone, MD.  I, Ronal JINNY Hailstone, MD, have reviewed all documentation for this visit. The  documentation on 05/17/2024 for the exam, diagnosis, procedures, and orders are all  accurate and complete.

## 2024-05-17 NOTE — Patient Instructions (Signed)
 You have been diagnosed with an acute lower urinary tract infection.  Please take Cipro  250 mg twice daily for 5 days.  Urine culture is pending and we will notify you of results.  Rest and stay well-hydrated.  May take Diflucan  tablet 150 mg one-time dose if needed for Candida vaginitis while on antibiotics.

## 2024-05-17 NOTE — Progress Notes (Signed)
 Patient Care Team: Rollene Almarie LABOR, MD as PCP - General (Internal Medicine) Wonda Sharper, MD as PCP - Cardiology (Cardiology) Inocencio Soyla Lunger, MD as PCP - Electrophysiology (Cardiology) Moses Powell Hummer, NP as Nurse Practitioner (Nurse Practitioner) Tobie Tonita POUR, DO as Consulting Physician (Neurology) Pa, [facility] as Consulting Physician (Ophthalmology)  Visit Date: 05/17/24  Subjective:    Patient ID: Crystal [name] , Female   DOB: Nov 09, 1950, 73 y.o.    MRN: [id number]   73 y.o. Female presents today for Dysuria onset last evening and has persisted today. No fever, shaking chills, frank hematuria, nausea or vomiting. No back pain. Patient has occasional urinary infections but not often. Urine culture collected today. Dipstick urine has moderate occult blood.     Past Medical History:  Diagnosis Date   Atrial fibrillation (HCC)    BACK PAIN, LUMBAR    Breast cancer of lower-outer quadrant of right female breast (HCC) 10/24/2014   Breast cancer, right breast    FIBROADENOMA, BREAST 2006   HPV in female    Hyperthyroidism    Neuromuscular disorder (HCC)    Peripheral Neuropathy   New onset atrial fibrillation (HCC) 01/06/2020   Palpitations 01/06/2020   PARESTHESIA    toes   PONV (postoperative nausea and vomiting)    Radiation 11/23/14-12/25/14   right breast 42.72 gray, lumpectomy cavity boosted to 54.72 gray   Routine health maintenance 03/30/2013   Colonoscopy - for colonoscopy (Sept '14)  Immunization  Tdap Oct '14    Spider veins of both lower extremities 05/09/2019   Wears glasses      Family History  Problem Relation Age of Onset   Hypothyroidism Mother    GER disease Mother    Osteoporosis Mother    Hypertension Mother    Cancer Mother        hodgkins lympoma, uterine cancer   Arrhythmia Mother    Vision loss Mother    Colon polyps Mother    Hyperlipidemia Father    Hypertension Father    Diabetes  Father    Coronary artery disease Father    Alzheimer's disease Brother    Coronary artery disease Maternal Grandfather    COPD Neg Hx    Esophageal cancer Neg Hx    Rectal cancer Neg Hx    Stomach cancer Neg Hx    Colon cancer Neg Hx     Social History   Social History Narrative   [place], Married '78,  1 son '81, 1 daughter '84, 1 grandson '12.    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 ACP - discussed with patient.    Refer - [email or web address]   Highest level of education:  BS   Right handed   Two story home      Lives with husband/2025      ROS no nausea vomiting, headache.      Objective:   Vitals:    Physical Exam    Results:   Studies obtained and personally reviewed by me:  Imaging, colonoscopy, mammogram, bone density scan, echocardiogram, heart cath, stress test, CT calcium score, etc.    Labs:       Component Value Date/Time   NA 140 09/29/2023 1027   NA 141 02/02/2017 0903   K 4.7 09/29/2023 1027   K 4.3 02/02/2017 0903   CL 102 09/29/2023 1027   CO2 27 09/29/2023 1027   CO2 31 (H) 02/02/2017 9096  GLUCOSE 88 09/29/2023 1027   GLUCOSE 70 09/24/2021 1434   GLUCOSE 71 02/02/2017 0903   BUN 17 09/29/2023 1027   BUN 18.9 02/02/2017 0903   CREATININE 0.91 09/29/2023 1027   CREATININE 1.11 (H) 01/05/2020 1503   CREATININE 0.9 02/02/2017 0903   CALCIUM 10.0 09/29/2023 1027   CALCIUM 9.7 02/02/2017 0903   PROT 7.1 09/24/2021 1434   PROT 6.9 02/02/2017 0903   ALBUMIN 4.3 09/24/2021 1434   ALBUMIN 3.7 02/02/2017 0903   AST 34 09/24/2021 1434   AST 21 02/02/2017 0903   ALT 40 (H) 09/24/2021 1434   ALT 17 02/02/2017 0903   ALKPHOS 68 09/24/2021 1434   ALKPHOS 57 02/02/2017 0903   BILITOT 0.4 09/24/2021 1434   BILITOT 0.40 02/02/2017 0903   GFRNONAA 58 (L) 05/01/2020 0956   GFRAA 67 05/01/2020 0956     Lab Results  Component Value Date   WBC 6.8 09/29/2023   HGB 12.3 09/29/2023    HCT 36.9 09/29/2023   MCV 94 09/29/2023   PLT 235 09/29/2023    Lab Results  Component Value Date   CHOL 208 (H) 11/03/2023   HDL 76.00 11/03/2023   LDLCALC 109 (H) 11/03/2023   TRIG 117.0 11/03/2023   CHOLHDL 3 11/03/2023        Assessment & Plan:   Acute lower urinary tract infection  Plan: Urine culture obtained. Prescribed Cipro  250 mg twice daily x 5 days. Diflucan  150 mg tab x 1 if needed for Candida vaginitis while on antibiotics.  Will contact her with results of urine culture.    I,Makayla C Reid,acting as a scribe for Ronal JINNY Hailstone, MD.,have documented all relevant documentation on the behalf of Ronal JINNY Hailstone, MD,as directed by  Ronal JINNY Hailstone, MD while in the presence of Ronal JINNY Hailstone, MD.   I, Ronal JINNY Hailstone, MD, have reviewed all documentation for this visit. The documentation on 05/17/2024 for the exam, diagnosis, procedures, and orders are all accurate and complete.

## 2024-05-19 LAB — URINE CULTURE
MICRO NUMBER:: 17250358
SPECIMEN QUALITY:: ADEQUATE

## 2024-05-24 ENCOUNTER — Ambulatory Visit: Admitting: Cardiovascular Disease

## 2024-05-30 ENCOUNTER — Encounter: Payer: Self-pay | Admitting: Internal Medicine

## 2024-05-30 ENCOUNTER — Ambulatory Visit: Admitting: Internal Medicine

## 2024-05-30 VITALS — BP 110/80 | HR 63 | Ht 69.0 in | Wt 168.0 lb

## 2024-05-30 DIAGNOSIS — Z853 Personal history of malignant neoplasm of breast: Secondary | ICD-10-CM

## 2024-05-30 DIAGNOSIS — M25571 Pain in right ankle and joints of right foot: Secondary | ICD-10-CM

## 2024-05-30 DIAGNOSIS — Z8679 Personal history of other diseases of the circulatory system: Secondary | ICD-10-CM

## 2024-05-30 MED ORDER — METHYLPREDNISOLONE 4 MG PO TABS: ORAL_TABLET | ORAL | 0 refills | Status: DC

## 2024-05-30 MED ORDER — HYDROCODONE-ACETAMINOPHEN 5-325 MG PO TABS: 1.0000 | ORAL_TABLET | Freq: Four times a day (QID) | ORAL | 0 refills | Status: AC | PRN

## 2024-05-30 NOTE — Progress Notes (Signed)
 Patient Care Team: Perri Ronal PARAS, MD as PCP - General (Internal Medicine) Wonda Sharper, MD as PCP - Cardiology (Cardiology) Inocencio Soyla Lunger, MD as PCP - Electrophysiology (Cardiology) Moses Powell Hummer, NP as Nurse Practitioner (Nurse Practitioner) Tobie Tonita POUR, DO as Consulting Physician (Neurology) Pa, [facility] as Consulting Physician (Ophthalmology)  Visit Date: 05/30/24  Subjective:    Patient ID: Crystal [name] , Female   DOB: 30-Jan-1951, 73 y.o.    MRN: [id number]   73 y.o. Female presents today for Ankle pain and swelling. Patient has a past medical history of Hyperthyroidism, Paroxysmal atrial fibrillation.  She went to went to a meeting  this morning  and as time went on her right foot felt stiff. By the time the meeting had ended, she had difficulty putting weight on the foot and walking. Has had no recents falls or known injury to the foot or ankle.  Physical exam showed puffiness around right lateral malleolus and dorsiflexion of foot causes pain. She denies eating any rich foods over the Thanksgiving holiday weekend. She took Tylenol  this morning but says it did not help the pain.   Past Medical History:  Diagnosis Date   Atrial fibrillation (HCC)    BACK PAIN, LUMBAR    Breast cancer (HCC)    Breast cancer of lower-outer quadrant of right female breast (HCC) 10/24/2014   Breast cancer, right breast    FIBROADENOMA, BREAST 2006   HPV in female    Hyperthyroidism    Neuromuscular disorder (HCC)    Peripheral Neuropathy   New onset atrial fibrillation (HCC) 01/06/2020   Osteoporosis    Osteopenia   Palpitations 01/06/2020   PARESTHESIA    toes   PONV (postoperative nausea and vomiting)    Radiation 11/23/14-12/25/14   right breast 42.72 gray, lumpectomy cavity boosted to 54.72 gray   Routine health maintenance 03/30/2013   Colonoscopy - for colonoscopy (Sept '14)  Immunization  Tdap Oct '14    Spider veins of both  lower extremities 05/09/2019   Wears glasses      Family History  Problem Relation Age of Onset   Hypothyroidism Mother    GER disease Mother    Osteoporosis Mother    Hypertension Mother    Cancer Mother        hodgkins lympoma, uterine cancer   Arrhythmia Mother    Vision loss Mother    Colon polyps Mother    Hyperlipidemia Father    Hypertension Father    Diabetes Father    Coronary artery disease Father    Alzheimer's disease Brother    Coronary artery disease Maternal Grandfather    COPD Neg Hx    Esophageal cancer Neg Hx    Rectal cancer Neg Hx    Stomach cancer Neg Hx    Colon cancer Neg Hx     Social History   Social History Narrative   [place], [place], Married '78,  1 son '81, 1 daughter '84, 1 grandson '12.    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 ACP - discussed with patient.    Refer - [email or web address]   Highest level of education:  BS   Right handed   Two story home      Lives with husband/2025     ROS negative except right ankle pain and swelling       Objective:   Vitals: BP 110/80   Pulse 63   Ht 5'  9 (1.753 m)   Wt 168 lb (76.2 kg)   SpO2 94%   BMI 24.81 kg/m    Physical Exam Musculoskeletal:     Right ankle: Swelling present.     Comments: Puffiness around right lateral malleolus.    dorsiflexion of foot causes pain      No pitting edema. Normal muscle strength in foot. Sensation is intact. No obvious deformity of the foot.  Results:    Labs:       Component Value Date/Time   NA 140 09/29/2023 1027   NA 141 02/02/2017 0903   K 4.7 09/29/2023 1027   K 4.3 02/02/2017 0903   CL 102 09/29/2023 1027   CO2 27 09/29/2023 1027   CO2 31 (H) 02/02/2017 0903   GLUCOSE 88 09/29/2023 1027   GLUCOSE 70 09/24/2021 1434   GLUCOSE 71 02/02/2017 0903   BUN 17 09/29/2023 1027   BUN 18.9 02/02/2017 0903   CREATININE 0.91 09/29/2023 1027   CREATININE 1.11 (H) 01/05/2020 1503    CREATININE 0.9 02/02/2017 0903   CALCIUM 10.0 09/29/2023 1027   CALCIUM 9.7 02/02/2017 0903   PROT 7.1 09/24/2021 1434   PROT 6.9 02/02/2017 0903   ALBUMIN 4.3 09/24/2021 1434   ALBUMIN 3.7 02/02/2017 0903   AST 34 09/24/2021 1434   AST 21 02/02/2017 0903   ALT 40 (H) 09/24/2021 1434   ALT 17 02/02/2017 0903   ALKPHOS 68 09/24/2021 1434   ALKPHOS 57 02/02/2017 0903   BILITOT 0.4 09/24/2021 1434   BILITOT 0.40 02/02/2017 0903   GFRNONAA 58 (L) 05/01/2020 0956   GFRAA 67 05/01/2020 0956     Lab Results  Component Value Date   WBC 6.8 09/29/2023   HGB 12.3 09/29/2023   HCT 36.9 09/29/2023   MCV 94 09/29/2023   PLT 235 09/29/2023    Lab Results  Component Value Date   CHOL 208 (H) 11/03/2023   HDL 76.00 11/03/2023   LDLCALC 109 (H) 11/03/2023   TRIG 117.0 11/03/2023   CHOLHDL 3 11/03/2023    Lab Results  Component Value Date   HGBA1C 5.7 11/03/2023     Lab Results  Component Value Date   TSH 1.15 03/24/2024        Assessment & Plan:   Meds ordered this encounter  Medications   methylPREDNISolone  (MEDROL ) 4 MG tablet    Sig: Take in tapering course as directed 6-5-4-3-2-1    Dispense:  21 tablet    Refill:  0   HYDROcodone -acetaminophen  (NORCO/VICODIN) 5-325 MG tablet    Sig: Take 1 tablet by mouth every 6 (six) hours as needed for moderate pain (pain score 4-6).    Dispense:  30 tablet    Refill:  0   Orders Placed This Encounter  Procedures   DG Foot Complete Right    Reason for Exam (SYMPTOM  OR DIAGNOSIS REQUIRED):   pain right ,malleolus, no known injury    Preferred imaging location?:   [facility]   CBC with Differential/Platelet   Sedimentation rate   Uric acid    Acute Right Ankle Pain : She went to went to a meeting this morning  and as time went on her right foot felt stiff and by the time  meeting had ended  says she could barely walk because of the pain. Physical exam shows some puffiness around right lateral malleolus and  dorsiflexion of foot causes pain. She denies eating any rich foods over the holiday weekend. She took Tylenol   this morning but said that it didn't help the pain.  Not sure what caused abrupt onset of pain but concerned this could be acute gout. Do not think this is an insect bite. Has no known injury nor recent fall to cause the pain and stiffness.   Medrol  4 mg tablets to take in tapering course 6-5-4-3-2-1 prescribed- suspect this could be acute gout. No known fall or injury and rather sudden onset of pain.   Hydrocodone -acetaminophen  5-325 mg every 6 hours as needed for pain.  (#15 tablets)     Right Foot X-ray ordered - no osseous abnormality seen   CBC, Sed Rate and Uric acid ordered and results reveal             uric acid level  of 4.1, sed rate  of 22, CBC with diff is normal with WBC of 5900.  Patient was advised to finish course of Medrol  as prescribed. May need to see Orthopedist if not responding to Medrol . Has no known fall or injury.  Addendum: Patient came by the office a couple of days later for an electrical issue with the office- she is our landlord.Still having issue with ankle pain despite Medrol  and Norco 5/325. Is some better but not resolved.Patient says she has seen Dr. Delane with [facility] in [place]  for knee pain in August 2025.If not improving, may be a reasonable next step to see him.  I,Makayla C Reid,acting as a scribe for Ronal JINNY Hailstone, MD.,have documented all relevant documentation on the behalf of Ronal JINNY Hailstone, MD,as directed by  Ronal JINNY Hailstone, MD while in the presence of Ronal JINNY Hailstone, MD.   I, Ronal JINNY Hailstone, MD, have reviewed all documentation for this visit. The documentation on 05/30/2024 for the exam, diagnosis, procedures, and orders are all accurate and complete.

## 2024-05-31 ENCOUNTER — Ambulatory Visit
Admission: RE | Admit: 2024-05-31 | Discharge: 2024-05-31 | Disposition: A | Source: Ambulatory Visit | Attending: Internal Medicine | Admitting: Internal Medicine

## 2024-05-31 ENCOUNTER — Ambulatory Visit: Payer: Self-pay | Admitting: Internal Medicine

## 2024-05-31 LAB — CBC WITH DIFFERENTIAL/PLATELET
Absolute Lymphocytes: 1941 {cells}/uL (ref 850–3900)
Absolute Monocytes: 401 {cells}/uL (ref 200–950)
Basophils Absolute: 41 {cells}/uL (ref 0–200)
Basophils Relative: 0.7 %
Eosinophils Absolute: 148 {cells}/uL (ref 15–500)
Eosinophils Relative: 2.5 %
HCT: 39 % (ref 35.9–46.0)
Hemoglobin: 13 g/dL (ref 11.7–15.5)
MCH: 31.3 pg (ref 27.0–33.0)
MCHC: 33.3 g/dL (ref 31.6–35.4)
MCV: 94 fL (ref 81.4–101.7)
MPV: 9.9 fL (ref 7.5–12.5)
Monocytes Relative: 6.8 %
Neutro Abs: 3369 {cells}/uL (ref 1500–7800)
Neutrophils Relative %: 57.1 %
Platelets: 254 Thousand/uL (ref 140–400)
RBC: 4.15 Million/uL (ref 3.80–5.10)
RDW: 11.6 % (ref 11.0–15.0)
Total Lymphocyte: 32.9 %
WBC: 5.9 Thousand/uL (ref 3.8–10.8)

## 2024-05-31 LAB — SEDIMENTATION RATE: Sed Rate: 22 mm/h (ref 0–30)

## 2024-05-31 LAB — URIC ACID: Uric Acid, Serum: 4.1 mg/dL (ref 2.5–7.0)

## 2024-06-06 ENCOUNTER — Other Ambulatory Visit: Payer: Self-pay | Admitting: Cardiology

## 2024-06-06 DIAGNOSIS — I480 Paroxysmal atrial fibrillation: Secondary | ICD-10-CM

## 2024-06-06 NOTE — Patient Instructions (Addendum)
 Not sure what provoked acute onset of ankle pain. Maybe this is acute gout. No known injury. Take Medrol  4 mg in tapering course as directed 6-5-4-3-2-1. Take Norco 5/325 sparingly every 6 hours for severe pain (5 day suppl) prescribed. Call if not improving in 24-48 hours or sooner if worse.

## 2024-06-06 NOTE — Telephone Encounter (Signed)
 Prescription refill request for Eliquis  received. Indication: PAF Last office visit:  04/25/24  Crystal Norton MD Scr: 0.91 on 09/29/23  Epic Age: 73 Weight: 73.9kg  Based on above findings Eliquis  5mg  twice daily is the appropriate dose.  Refill approved.

## 2024-07-11 ENCOUNTER — Ambulatory Visit (INDEPENDENT_AMBULATORY_CARE_PROVIDER_SITE_OTHER): Payer: Medicare Other | Admitting: Neurology

## 2024-07-11 ENCOUNTER — Encounter: Payer: Self-pay | Admitting: Neurology

## 2024-07-11 VITALS — BP 107/69 | HR 64 | Ht 69.0 in | Wt 166.0 lb

## 2024-07-11 DIAGNOSIS — G629 Polyneuropathy, unspecified: Secondary | ICD-10-CM | POA: Insufficient documentation

## 2024-07-11 NOTE — Patient Instructions (Addendum)
 Skin biopsy of the left foot with Dr. Leigh

## 2024-07-11 NOTE — Progress Notes (Signed)
 "   Follow-up Visit   Date: 07/11/2024   DAMICA GRAVLIN MRN: [id number] DOB: 12-09-1950   Interim History: Crystal [name] is a 74 y.o. right-handed Caucasian female with right breast cancer s/p radiation (2016), and hyperthyroidism, and atrial fibrillation returning to the clinic for follow-up of bilateral feet numbness/tingling.  The patient was accompanied to the clinic by self.  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  NCS/EMG was normal x 2 in the past although history highly suggestive of neuropathy.  Her exam is also normal.  History is suggestive of distal neuropathy, so will check for small fiber neuropathy with skin biopsy (left foot).   Return to clinic 1 year  ------------------------------------------------------ History of present illness: Starting around 2011, she started having tingling of the tips of her toes.  She saw Dr. Harlow and was told she may have early signs of neuropathy.  During the summer of 2018, she starting having burning sensation over the toes and the pad of the feet.   She recalls having to take her shoes off her feet because of severe pain.  Lately, she does not have severe pain, but continues to have tingling and numbness of the toes, which is worse on the left.  She denies any imbalance, falls, or weakness.    She denies any history of diabetes mellitus, alcohol abuse, or family history neuropathy.   She had normal EDX of the legs performed in January 2020 which was normal.  UPDATE 07/04/2022:  She continues to have burning and tingling of the balls feet, worse on the left which is worse in the evening.  No weakness, numbness, or imbalance.  She continues to manage office buildings (self-employed) and stays busy and very active.  She does yoga once weekly.  No new complaints.   UPDATE 07/06/2022:  She is here for 1 year follow-up visit.  She continues to have burning and tingling in  the toes and balls of the feet, worse on the left.  There has been no significant change over the past year.  Symptoms are aggravating and annoying more than anything. She remains highly independent and continues to work.  No interval falls, hospitalizations, or illnesses.   UPDATE 07/12/2023:  She is here for 1 year follow-up visit.  She reports that feet pain is less intense, but she continues to have tingling and stiffness in the feet.  She is wearing less constrictive shoes and feels that arch support has been helpful.    No imbalance or falls.  Prior NCS/EMG from 2025 was normal.  No new complaints.   Medications:  Current Outpatient Medications on File Prior to Visit  Medication Sig Dispense Refill   acetaminophen  (TYLENOL ) 325 MG tablet Take 325 mg by mouth as needed for moderate pain or headache.     apixaban  (ELIQUIS ) 5 MG TABS tablet TAKE 1 TABLET TWICE A DAY 180 tablet 1   Calcium Carbonate-Vitamin D  600-400 MG-UNIT tablet Take 1 tablet by mouth daily.     Cholecalciferol (VITAMIN D3) 1000 UNITS CAPS Take 1,000 Units by mouth daily.     flecainide  (TAMBOCOR ) 100 MG tablet Take 1 tablet (100 mg total) by mouth 2 (two) times daily. 180 tablet 1   methimazole  (TAPAZOLE ) 5 MG tablet Take 0.5 tablets (2.5 mg total) by mouth daily. 45 tablet 3   metoprolol  succinate (TOPROL -XL) 50 MG 24 hr tablet Take 1 tablet (50 mg total) by mouth daily. Take with or immediately following  a meal. 90 tablet 2   Multiple Vitamin (MULTIVITAMIN) tablet Take 1 tablet by mouth daily.     Polyethyl Glycol-Propyl Glycol (SYSTANE OP) Place 1 drop into both eyes daily as needed (For dryness.).      Probiotic Product (FORTIFY DAILY PROBIOTIC PO) Take 1 capsule by mouth daily.      HYDROcodone -acetaminophen  (NORCO/VICODIN) 5-325 MG tablet Take 1 tablet by mouth every 6 (six) hours as needed for moderate pain (pain score 4-6). (Patient not taking: Reported on 07/11/2024) 30 tablet 0   No current facility-administered  medications on file prior to visit.    Allergies: No Known Allergies  Vital Signs:  BP 107/69   Pulse 64   Ht 5' 9 (1.753 m)   Wt 166 lb (75.3 kg)   SpO2 99%   BMI 24.51 kg/m   Neurological Exam: MENTAL STATUS including orientation to time, place, person, recent and remote memory, attention span and concentration, language, and fund of knowledge is normal.  Speech is not dysarthric.  CN:  Extraocular muscular are intact.  Face is symmetric.   MOTOR:  Motor strength is 5/5 in all extremities, including distally in the feet.  No pronator drift.  Tone is normal.    MSRs:  Reflexes are 2+/4 throughout .  SENSORY:  Intact to vibration and temperature throughout.   COORDINATION/GAIT:   Gait narrow based and stable.  Tandem gait intact.    Data: NCS/EMG of the legs 07/20/2018:  This is a normal study of the lower extremities.  In particular, there is no evidence of a sensorimotor polyneuropathy or lumbosacral radiculopathy.    NCS/EMG of the legs 07/24/2023:   This is a normal study of the lower extremities.  Compared to prior study on 07/20/2018, there has been no significant change.   In particular, there is no evidence of a large fiber sensorimotor polyneuropathy or lumbosacral radiculopathy.  Labs10/10/2016:  ESR 41, vitamin B12 597, vitamin B1 20, SPEP with IFE no M protein, glucose tolerance testing normal    Thank you for allowing me to participate in patient's care.  If I can answer any additional questions, I would be pleased to do so.    Sincerely,    Kittie Krizan K. Tobie, DO  "

## 2024-07-26 ENCOUNTER — Other Ambulatory Visit: Payer: Self-pay | Admitting: Neurology

## 2024-08-09 ENCOUNTER — Other Ambulatory Visit: Admitting: Neurology

## 2024-09-12 ENCOUNTER — Other Ambulatory Visit

## 2024-11-18 ENCOUNTER — Ambulatory Visit

## 2025-03-24 ENCOUNTER — Ambulatory Visit: Admitting: Internal Medicine

## 2025-07-11 ENCOUNTER — Ambulatory Visit: Payer: Self-pay | Admitting: Neurology
